# Patient Record
Sex: Male | Born: 1950 | Race: White | Hispanic: No | Marital: Married | Smoking: Current every day smoker
Health system: Southern US, Community
[De-identification: ages and names within clinical notes are randomized; demographics above are authoritative.]

## PROBLEM LIST (undated history)

## (undated) DIAGNOSIS — J432 Centrilobular emphysema: Secondary | ICD-10-CM

## (undated) DIAGNOSIS — K08109 Complete loss of teeth, unspecified cause, unspecified class: Secondary | ICD-10-CM

## (undated) DIAGNOSIS — M1990 Unspecified osteoarthritis, unspecified site: Secondary | ICD-10-CM

## (undated) DIAGNOSIS — Z87442 Personal history of urinary calculi: Secondary | ICD-10-CM

## (undated) DIAGNOSIS — E785 Hyperlipidemia, unspecified: Secondary | ICD-10-CM

## (undated) DIAGNOSIS — Z973 Presence of spectacles and contact lenses: Secondary | ICD-10-CM

## (undated) DIAGNOSIS — N400 Enlarged prostate without lower urinary tract symptoms: Secondary | ICD-10-CM

## (undated) DIAGNOSIS — C189 Malignant neoplasm of colon, unspecified: Secondary | ICD-10-CM

## (undated) DIAGNOSIS — I700 Atherosclerosis of aorta: Secondary | ICD-10-CM

## (undated) DIAGNOSIS — J410 Simple chronic bronchitis: Secondary | ICD-10-CM

## (undated) DIAGNOSIS — I708 Atherosclerosis of other arteries: Secondary | ICD-10-CM

## (undated) DIAGNOSIS — Z972 Presence of dental prosthetic device (complete) (partial): Secondary | ICD-10-CM

## (undated) DIAGNOSIS — R918 Other nonspecific abnormal finding of lung field: Secondary | ICD-10-CM

## (undated) DIAGNOSIS — I70219 Atherosclerosis of native arteries of extremities with intermittent claudication, unspecified extremity: Secondary | ICD-10-CM

## (undated) HISTORY — DX: Atherosclerosis of other arteries: I70.8

## (undated) HISTORY — DX: Malignant neoplasm of colon, unspecified: C18.9

## (undated) HISTORY — DX: Hyperlipidemia, unspecified: E78.5

## (undated) HISTORY — DX: Benign prostatic hyperplasia without lower urinary tract symptoms: N40.0

## (undated) HISTORY — DX: Atherosclerosis of aorta: I70.0

## (undated) HISTORY — PX: MULTIPLE TOOTH EXTRACTIONS: SHX2053

---

## 2011-06-09 ENCOUNTER — Ambulatory Visit: Payer: Self-pay | Admitting: Family Medicine

## 2011-06-09 VITALS — BP 128/69 | HR 67 | Temp 97.4°F | Resp 16 | Ht 65.0 in | Wt 128.0 lb

## 2011-06-09 DIAGNOSIS — L0291 Cutaneous abscess, unspecified: Secondary | ICD-10-CM

## 2011-06-09 DIAGNOSIS — L03221 Cellulitis of neck: Secondary | ICD-10-CM

## 2011-06-09 MED ORDER — DOXYCYCLINE HYCLATE 100 MG PO CAPS: 100.0000 mg | ORAL_CAPSULE | Freq: Two times a day (BID) | ORAL | Status: AC

## 2011-06-09 NOTE — Progress Notes (Signed)
  Patient Name: Mario Mcdowell Date of Birth: [birth year] Medical Record Number: [id number] Gender: male Date of Encounter: 06/09/2011  History of Present Illness:  Mario Mcdowell is a 61 y.o. very pleasant male patient who presents with the following:  He had an itchy rash on his right forearm- he scratched them and they ?got infected and "turned into a water blister."  This started 3 weeks ago. He admits to scratching it quite a bit.   He then developed a painful bump on the right side of his posterior neck.  It is not itchy- just painful.  He has tried to pop it and "scratched the top off of it" in his efforts but to no avail.  No fever, otherwise generally healthy except for tobacco use as far as he knows He works outside and is active  There is no problem list on file for this patient.  No past medical history on file. No past surgical history on file. History  Substance Use Topics  . Smoking status: Current Everyday Smoker  . Smokeless tobacco: Not on file  . Alcohol Use: Not on file   No family history on file. Allergies  Allergen Reactions  . Penicillins Swelling    Medication list has been reviewed and updated.  Review of Systems: As per HPI- otherwise negative.   Physical Examination: Filed Vitals:   06/09/11 0811  BP: 128/69  Pulse: 67  Temp: 97.4 F (36.3 C)  TempSrc: Oral  Resp: 16  Height: 5\' 5"  (1.651 m)  Weight: 128 lb (58.06 kg)    Body mass index is 21.30 kg/(m^2).  GEN: WDWN, NAD, Non-toxic, A & O x 3, wiry build, chronic sun/ tobacco exposure.  HEENT: Atraumatic, Normocephalic. Neck supple. No masses, No LAD.  Tm and oropharynx wnl Ears and Nose: No external deformity. CV: RRR, No M/G/R. No JVD. No thrill. No extra heart sounds. PULM: CTA B, no wheezes, crackles, rhonchi. No retractions. No resp. distress. No accessory muscle use. EXTR: No c/c/e NEURO Normal gait.  PSYCH: Normally interactive. Conversant. Not depressed or anxious appearing.   Calm demeanor.  Right arm: 2 healing lesions which appear to have been picked.  No vesicles or pus.  There is another picked area on the back of his neck- it is slightly swollen and tender, but not fluctuant.  It is slightly warm.  No drainage  Assessment and Plan: 1. Cellulitis, neck  doxycycline (VIBRAMYCIN) 100 MG capsule   Skin infections, likely related to picking skin. Gave #20 doxy pills from our stock bottle and instructed to take 1 BID.  Hot compresses to neck as much as possible to help with drainage. No picking!  Patient (or parent if minor) instructed to return to clinic or call if not better in 2-3 day(s).  Sooner if worse.

## 2011-08-11 ENCOUNTER — Ambulatory Visit: Payer: Self-pay | Admitting: Emergency Medicine

## 2011-08-11 VITALS — BP 147/72 | HR 69 | Temp 97.7°F | Resp 18 | Ht 64.5 in | Wt 127.2 lb

## 2011-08-11 DIAGNOSIS — IMO0002 Reserved for concepts with insufficient information to code with codable children: Secondary | ICD-10-CM

## 2011-08-11 DIAGNOSIS — L02419 Cutaneous abscess of limb, unspecified: Secondary | ICD-10-CM

## 2011-08-11 DIAGNOSIS — L03119 Cellulitis of unspecified part of limb: Secondary | ICD-10-CM

## 2011-08-11 MED ORDER — SULFAMETHOXAZOLE-TRIMETHOPRIM 800-160 MG PO TABS: 1.0000 | ORAL_TABLET | Freq: Two times a day (BID) | ORAL | Status: AC

## 2011-08-11 NOTE — Patient Instructions (Addendum)

## 2011-08-11 NOTE — Progress Notes (Signed)
  Subjective:    Patient ID: Mario Mcdowell, male    DOB: 1950/04/02, 62 y.o.   MRN: [id number]  HPI  Bitten by an insect a week ago and has pain in forearm.  Now swollen and red and draining an amber serous drainage.  No fever or chills.  No significant pain.  Is convinced he was bitten by a spider  Review of Systems    As per HPI, otherwise negative.    Objective:   Physical Exam  Cellulitis forearm surrounding a bite on his mid forearm extensor surface.  NATI.        Assessment & Plan:  Cellulitis forearm Septra Local heat F/u as needed

## 2012-01-05 ENCOUNTER — Ambulatory Visit: Payer: Self-pay | Admitting: Family Medicine

## 2012-01-05 VITALS — BP 138/75 | HR 71 | Temp 98.0°F | Resp 17 | Ht 64.0 in | Wt 124.0 lb

## 2012-01-05 DIAGNOSIS — H9209 Otalgia, unspecified ear: Secondary | ICD-10-CM

## 2012-01-05 DIAGNOSIS — H6120 Impacted cerumen, unspecified ear: Secondary | ICD-10-CM

## 2012-01-05 NOTE — Progress Notes (Signed)
  [facility]:  Office Visit  Chief Complaint:  Chief Complaint  Patient presents with  . Ear Fullness    ear pressure     HPI: Mario Mcdowell is a 61 y.o. male who complains of  1 week history of fullness in right ear. Tried everything otc without releif. Decrease hearing.   History reviewed. No pertinent past medical history. History reviewed. No pertinent past surgical history. History   Social History  . Marital Status: Married    Spouse Name: N/A    Number of Children: N/A  . Years of Education: N/A   Social History Main Topics  . Smoking status: Current Every Day Smoker -- 1.0 packs/day for 46 years    Types: Cigarettes  . Smokeless tobacco: Never Used  . Alcohol Use: No  . Drug Use: No  . Sexually Active: No   Other Topics Concern  . None   Social History Narrative  . None   History reviewed. No pertinent family history. Allergies  Allergen Reactions  . Penicillins Swelling   Prior to Admission medications   Medication Sig Start Date End Date Taking? Authorizing Provider  acetaminophen (TYLENOL) 325 MG tablet Take 650 mg by mouth every 6 (six) hours as needed.    Historical Provider, MD     ROS: The patient denies fevers, chills, night sweats, unintentional weight loss, chest pain, palpitations, wheezing, dyspnea on exertion, nausea, vomiting, abdominal pain, dysuria, hematuria, melena, numbness, weakness, or tingling.   All other systems have been reviewed and were otherwise negative with the exception of those mentioned in the HPI and as above.    PHYSICAL EXAM: Filed Vitals:   01/05/12 1622  BP: 138/75  Pulse: 71  Temp: 98 F (36.7 C)  Resp: 17   Filed Vitals:   01/05/12 1622  Height: 5\' 4"  (1.626 m)  Weight: 124 lb (56.246 kg)   Body mass index is 21.28 kg/(m^2).  General: Alert, no acute distress HEENT:  Normocephalic, atraumatic, oropharynx patent. Right TM blocked. Left TM nl Cardiovascular:  Regular rate and rhythm,  no rubs murmurs or gallops.  No Carotid bruits, radial pulse intact. No pedal edema.  Respiratory: Clear to auscultation bilaterally.  No wheezes, rales, or rhonchi.  No cyanosis, no use of accessory musculature GI: No organomegaly, abdomen is soft and non-tender, positive bowel sounds.  No masses. Skin: No rashes. Neurologic: Facial musculature symmetric. Psychiatric: Patient is appropriate throughout our interaction. Lymphatic: No cervical lymphadenopathy Musculoskeletal: Gait intact.   LABS: No results found for this or any previous visit.   EKG/XRAY:   Primary read interpreted by Dr. Conley Rolls at [facility].   ASSESSMENT/PLAN: Encounter Diagnosis  Name Primary?  . Cerumen impaction Yes   Cerumen disimpaction done without problems of left ear Patient has decrease hearing on right ear , whisper test ok but finger rubbing test was abnormal F/u prn     LE, THAO PHUONG, DO 01/05/2012 5:16 PM

## 2012-04-21 ENCOUNTER — Ambulatory Visit (INDEPENDENT_AMBULATORY_CARE_PROVIDER_SITE_OTHER): Payer: BC Managed Care – PPO | Admitting: Family Medicine

## 2012-04-21 VITALS — BP 108/72 | HR 72 | Temp 98.4°F | Resp 17 | Ht 64.0 in | Wt 122.0 lb

## 2012-04-21 DIAGNOSIS — L858 Other specified epidermal thickening: Secondary | ICD-10-CM

## 2012-04-21 DIAGNOSIS — D485 Neoplasm of uncertain behavior of skin: Secondary | ICD-10-CM

## 2012-04-21 DIAGNOSIS — D239 Other benign neoplasm of skin, unspecified: Secondary | ICD-10-CM

## 2012-04-21 NOTE — Progress Notes (Signed)
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 It has minimal discharge although patient has been manipulating it trying to express some inner contents. He is minimally tender, particularly at night when he rolls over on that side.  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  Objective: No acute distress. Lesion in question identified on the right anterolateral neck of the patient 2 and half to 3 cm donut-shaped lesion with central black crusty eschar and uniform surrounding elevated erythematous circumferential skin elevation. The lesion is well-defined. It is nontender and not draining.  After discussing the almost certain diagnosis of keratoacanthoma with patient, we have decided to proceed with removal of the lesion here in the office. Patient was in agreement.  Lesion was prepped with Betadine Lesion was then anesthetized with 1% Xylocaine and epinephrine with establishment of lesional anesthesia and circumferential numbness. Lesion was then excised using #15 Bard-Parker blade and surgical scissors to establish clean margins. The lesion was then placed in a specimen container and taken to the lab. The 3 cm wound was then closed using interrupted 5-0 mattress stitches x4  There no complications, no significant bleeding. He started wrestling was applied.  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  Plan: Patient to return for any significant discomfort, discharge, redness, fever. Otherwise he should return in one week for suture removal

## 2012-04-28 ENCOUNTER — Ambulatory Visit (INDEPENDENT_AMBULATORY_CARE_PROVIDER_SITE_OTHER): Payer: BC Managed Care – PPO | Admitting: Physician Assistant

## 2012-04-28 VITALS — BP 132/82 | HR 71 | Temp 98.2°F | Resp 17 | Ht 64.5 in | Wt 122.0 lb

## 2012-04-28 DIAGNOSIS — C801 Malignant (primary) neoplasm, unspecified: Secondary | ICD-10-CM

## 2012-04-28 DIAGNOSIS — IMO0002 Reserved for concepts with insufficient information to code with codable children: Secondary | ICD-10-CM

## 2012-04-28 NOTE — Progress Notes (Signed)
  Subjective:    Patient ID: Mario Mcdowell, male    DOB: [birth year], 62 y.o.   MRN: [id number]  HPI 62 year old male presents for suture removal.  Area is doing well - no erythema, warmth, or drainage. He admits he is concerned because pathology showed possible cancer.  He does have a history of skin cancer that he had removed on his lip several years ago.  Also complains of "multiple tiny scabs and crusted area on both arms" that seem to flake off and then recur.  Does admit to a great deal of sun exposure over the course of him lifetime due to working primarily outside.     Review of Systems  Constitutional: Negative for fever and chills.  Skin: Positive for wound. Negative for color change.  Neurological: Negative for headaches.       Objective:   Physical Exam  Constitutional: He is oriented to person, place, and time. He appears well-developed and well-nourished.  HENT:  Head: Normocephalic and atraumatic.  Right Ear: External ear normal.  Left Ear: External ear normal.  Neurological: He is alert and oriented to person, place, and time.  Skin:  Multiple crusted, flaky lesions on bilateral arms. Right neck well healing wound, slight erythema but no warmth, drainage, or tenderness.   Psychiatric: He has a normal mood and affect. His behavior is normal. Judgment and thought content normal.     #4 sutures removed without difficulty. Patient tolerated well     Assessment & Plan:  Squamous cell carcinoma - Plan: Ambulatory referral to Dermatology Sutures removed. Wound care provided.  Referral to Dermatology for further evaluation and treatment Biopsy reveals Squamous Cell Carcinoma on neck with margins involved. Will need further treatment.  Also recommend evaluation of actinic keratosis on both arms Recommend daily sunscreen use, since patient unable to avoid sun.  In addition, recommend he have a CPE in the next 1-2 months for full labwork and screening.

## 2012-05-01 ENCOUNTER — Telehealth: Payer: Self-pay | Admitting: *Deleted

## 2012-05-01 NOTE — Telephone Encounter (Signed)
Called patient per Doctor Lauenstein to give results of pathology. Patients's wife answered and took the message per hippa. Message was pathology came back as squamous cell carcinoma, but to reassure patient that Dr. said he wasn't worried and he still thought it was a keratoacanthoma. Doctor wants patient to come back in two weeks so he can take a look. I transferred his wife over to [facility] to make an appt. Pathology report sent to be scanned in.

## 2012-05-11 ENCOUNTER — Ambulatory Visit (INDEPENDENT_AMBULATORY_CARE_PROVIDER_SITE_OTHER): Payer: BC Managed Care – PPO | Admitting: Family Medicine

## 2012-05-11 VITALS — BP 118/56 | HR 66 | Temp 97.5°F | Resp 16 | Ht 64.5 in | Wt 121.0 lb

## 2012-05-11 DIAGNOSIS — C4442 Squamous cell carcinoma of skin of scalp and neck: Secondary | ICD-10-CM

## 2012-05-11 DIAGNOSIS — M542 Cervicalgia: Secondary | ICD-10-CM

## 2012-05-11 DIAGNOSIS — C760 Malignant neoplasm of head, face and neck: Secondary | ICD-10-CM

## 2012-05-11 NOTE — Progress Notes (Signed)
62 year old gentleman who spends a lot of time outdoors and came in several weeks ago with skin lesion on his right neck but to be a keratoacanthoma. The pathology showed some areas there were no distinguishable from squamous cell cancer and patient come back with a small little nodule on the suture line that he wants to have checked. He was referred to dermatologist.  Objective: Half centimeter nodule in wound line on right neck.  Area was prepped with Betadine, anesthetized with 1% Xylocaine and epinephrine.  Elliptical incision was made and the nodular area was removed 5-0 Ethilon was used to close the wound and a Band-Aid was applied  Assessment: Possible squamous cell cancer in the right neck  Plan: Send for dermatological pathology Return one week for suture removal   Signed, Elvina Sidle

## 2012-05-17 ENCOUNTER — Ambulatory Visit (INDEPENDENT_AMBULATORY_CARE_PROVIDER_SITE_OTHER): Payer: BC Managed Care – PPO | Admitting: Family Medicine

## 2012-05-17 ENCOUNTER — Encounter: Payer: Self-pay | Admitting: Physician Assistant

## 2012-05-17 VITALS — BP 114/64 | HR 65 | Temp 97.9°F | Resp 16 | Ht 64.5 in | Wt 122.4 lb

## 2012-05-17 DIAGNOSIS — C4492 Squamous cell carcinoma of skin, unspecified: Secondary | ICD-10-CM

## 2012-05-17 NOTE — Progress Notes (Signed)
Symptomatic: 62 year old man comes in for suture removal after skin lesion was excised from right neck.  Objective: No complaints of pain, swelling or itching 3 sutures removed without problem from well-healed right surgical site on the neck Past report showed atypical cells consistent with either squamous cell cancer or keratoacanthoma  Assessment: It appears that the entire lesion was removed.  Plan: I have asked patient to monitor for swelling or scaling in the area of his right neck.  He will return if any change occurs  Signed, Elvina Sidle and

## 2013-04-26 ENCOUNTER — Ambulatory Visit: Payer: No Typology Code available for payment source

## 2015-07-16 DIAGNOSIS — Z87442 Personal history of urinary calculi: Secondary | ICD-10-CM

## 2015-07-16 HISTORY — DX: Personal history of urinary calculi: Z87.442

## 2015-08-08 ENCOUNTER — Emergency Department: Payer: BLUE CROSS/BLUE SHIELD

## 2015-08-08 ENCOUNTER — Encounter: Payer: Self-pay | Admitting: Emergency Medicine

## 2015-08-08 ENCOUNTER — Emergency Department
Admission: EM | Admit: 2015-08-08 | Discharge: 2015-08-09 | Disposition: A | Discharge status: Home / Self Care | Payer: BLUE CROSS/BLUE SHIELD | Attending: Emergency Medicine | Admitting: Emergency Medicine

## 2015-08-08 DIAGNOSIS — N200 Calculus of kidney: Secondary | ICD-10-CM | POA: Insufficient documentation

## 2015-08-08 DIAGNOSIS — R1031 Right lower quadrant pain: Secondary | ICD-10-CM | POA: Diagnosis present

## 2015-08-08 DIAGNOSIS — F17210 Nicotine dependence, cigarettes, uncomplicated: Secondary | ICD-10-CM | POA: Diagnosis not present

## 2015-08-08 LAB — COMPREHENSIVE METABOLIC PANEL
ALBUMIN: 4.4 g/dL (ref 3.5–5.0)
ALK PHOS: 78 U/L (ref 38–126)
ALT: 16 U/L — AB (ref 17–63)
AST: 28 U/L (ref 15–41)
Anion gap: 8 (ref 5–15)
BILIRUBIN TOTAL: 0.8 mg/dL (ref 0.3–1.2)
BUN: 16 mg/dL (ref 6–20)
CALCIUM: 8.7 mg/dL — AB (ref 8.9–10.3)
CO2: 22 mmol/L (ref 22–32)
CREATININE: 0.98 mg/dL (ref 0.61–1.24)
Chloride: 109 mmol/L (ref 101–111)
GFR calc non Af Amer: >60 mL/min (ref >60)
GLUCOSE: 113 mg/dL — AB (ref 65–99)
Potassium: 4.5 mmol/L (ref 3.5–5.1)
SODIUM: 139 mmol/L (ref 135–145)
TOTAL PROTEIN: 7.7 g/dL (ref 6.5–8.1)

## 2015-08-08 LAB — URINALYSIS COMPLETE WITH MICROSCOPIC (ARMC ONLY)
Bacteria, UA: NONE SEEN
Bilirubin Urine: NEGATIVE
Glucose, UA: NEGATIVE mg/dL
Leukocytes, UA: NEGATIVE
Nitrite: NEGATIVE
PH: 6 (ref 5.0–8.0)
PROTEIN: NEGATIVE mg/dL
Specific Gravity, Urine: 1.018 (ref 1.005–1.030)

## 2015-08-08 LAB — CBC WITH DIFFERENTIAL/PLATELET
Basophils Absolute: 0.2 10*3/uL — ABNORMAL HIGH (ref 0–0.1)
Basophils Relative: 1 %
EOS ABS: 0 10*3/uL (ref 0–0.7)
Eosinophils Relative: 0 %
HEMATOCRIT: 45.5 % (ref 40.0–52.0)
HEMOGLOBIN: 15.9 g/dL (ref 13.0–18.0)
LYMPHS ABS: 3.5 10*3/uL (ref 1.0–3.6)
Lymphocytes Relative: 20 %
MCH: 32.6 pg (ref 26.0–34.0)
MCHC: 34.9 g/dL (ref 32.0–36.0)
MCV: 93.4 fL (ref 80.0–100.0)
Monocytes Absolute: 0.9 10*3/uL (ref 0.2–1.0)
NEUTROS ABS: 13 10*3/uL — AB (ref 1.4–6.5)
Platelets: 210 10*3/uL (ref 150–440)
RBC: 4.87 MIL/uL (ref 4.40–5.90)
RDW: 14.9 % — ABNORMAL HIGH (ref 11.5–14.5)
WBC: 17.6 10*3/uL — AB (ref 3.8–10.6)

## 2015-08-08 LAB — TROPONIN I: Troponin I: <0.03 ng/mL (ref <0.03)

## 2015-08-08 LAB — LIPASE, BLOOD: Lipase: 66 U/L — ABNORMAL HIGH (ref 11–51)

## 2015-08-08 MED ORDER — DIATRIZOATE MEGLUMINE & SODIUM 66-10 % PO SOLN
15.0000 mL | Freq: Once | ORAL | Status: AC
Start: 2015-08-08 — End: 2015-08-08
  Administered 2015-08-08: 15 mL via ORAL

## 2015-08-08 MED ORDER — MORPHINE SULFATE (PF) 4 MG/ML IV SOLN
4.0000 mg | Freq: Once | INTRAVENOUS | Status: AC
Start: 2015-08-08 — End: 2015-08-08
  Administered 2015-08-08: 4 mg via INTRAVENOUS

## 2015-08-08 MED ORDER — ONDANSETRON HCL 4 MG/2ML IJ SOLN
INTRAMUSCULAR | Status: AC
  Administered 2015-08-08: 4 mg via INTRAVENOUS
  Filled 2015-08-08: qty 2

## 2015-08-08 MED ORDER — IOPAMIDOL (ISOVUE-300) INJECTION 61%
100.0000 mL | Freq: Once | INTRAVENOUS | Status: AC | PRN
  Administered 2015-08-08: 100 mL via INTRAVENOUS

## 2015-08-08 MED ORDER — MORPHINE SULFATE (PF) 4 MG/ML IV SOLN
INTRAVENOUS | Status: AC
  Administered 2015-08-08: 4 mg via INTRAVENOUS
  Filled 2015-08-08: qty 1

## 2015-08-08 MED ORDER — SODIUM CHLORIDE 0.9 % IV BOLUS (SEPSIS)
1000.0000 mL | Freq: Once | INTRAVENOUS | Status: AC
  Administered 2015-08-08: 1000 mL via INTRAVENOUS

## 2015-08-08 MED ORDER — ONDANSETRON HCL 4 MG/2ML IJ SOLN
4.0000 mg | Freq: Once | INTRAMUSCULAR | Status: AC
Start: 2015-08-08 — End: 2015-08-08
  Administered 2015-08-08: 4 mg via INTRAVENOUS

## 2015-08-08 NOTE — ED Provider Notes (Signed)
[facility] Emergency Department Provider Note   ____________________________________________  Time seen: Approximately 7:15 PM I have reviewed the triage vital signs and the triage nursing note.  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 Slightly waxing and waning but somewhat worse now. Nothing makes was better. He has not noticed hematuria or dysuria, although right now he feels like he has to urinate. No vomiting. No chest pain. No diarrhea. No prior history of kidney stones. Pain is currently severe.    History reviewed. No pertinent past medical history.  There are no active problems to display for this patient.   History reviewed. No pertinent surgical history.    Allergies Penicillins  No family history on file.  Social History Social History  Substance Use Topics  . Smoking status: Current Every Day Smoker    Packs/day: 1.00    Years: 46.00    Types: Cigarettes  . Smokeless tobacco: Never Used  . Alcohol use No    Review of Systems  Constitutional: Negative for fever. Eyes: Negative for visual changes. ENT: Negative for sore throat. Cardiovascular: Negative for chest pain. Respiratory: Negative for shortness of breath. Gastrointestinal: Negative for vomiting Or diarrhea. Genitourinary: Negative for dysuria. Musculoskeletal: Negative for back pain. Skin: Negative for rash. Neurological: Negative for headache. 10 point Review of Systems otherwise negative ____________________________________________   PHYSICAL EXAM:  VITAL SIGNS: ED Triage Vitals  Enc Vitals Group     BP 08/08/15 1849 (!) 199/98     Pulse Rate 08/08/15 1849 82     Resp 08/08/15 1849 20     Temp 08/08/15 1849 97.8 F (36.6 C)     Temp Source 08/08/15 1849 Oral     SpO2 08/08/15 1849 100 %      Weight 08/08/15 1849 120 lb (54.4 kg)     Height 08/08/15 1849 5\' 8"  (1.727 m)     Head Circumference --      Peak Flow --      Pain Score 08/08/15 1850 8     Pain Loc --      Pain Edu? --      Excl. in [place]? --      Constitutional: Alert and oriented. Appears to be in pain writhing a little bit on the bed. HEENT   Head: Normocephalic and atraumatic.      Eyes: Conjunctivae are normal. PERRL. Normal extraocular movements.      Ears:         Nose: No congestion/rhinnorhea.   Mouth/Throat: Mucous membranes are moist.   Neck: No stridor. Cardiovascular/Chest: Normal rate, regular rhythm.  No murmurs, rubs, or gallops. Respiratory: Normal respiratory effort without tachypnea nor retractions. Breath sounds are clear and equal bilaterally. No wheezes/rales/rhonchi. Gastrointestinal: Soft. No distention, no guarding, no rebound. Mild discomfort in the right side of the abdomen without focal McBurney's point tenderness.  Genitourinary/rectal:Deferred Musculoskeletal: Nontender with normal range of motion in all extremities. No joint effusions.  No lower extremity tenderness.  No edema. Neurologic:  Normal speech and language. No gross or focal neurologic deficits are appreciated. Skin:  Skin is warm, dry and intact. No rash noted. Psychiatric: Mood and affect are normal. Speech and behavior are normal. Patient exhibits appropriate insight and judgment.  ____________________________________________   EKG I, Lisa Roca, MD, the attending physician have personally viewed  and interpreted all ECGs.  77 bpm. Normal sinus rhythm. Narrow QRS. Normal axis. Normal ST and T waves. Q waves septally. ____________________________________________  LABS (pertinent positives/negatives)  Labs Reviewed  CBC WITH DIFFERENTIAL/PLATELET  COMPREHENSIVE METABOLIC PANEL  LIPASE, BLOOD  TROPONIN I  URINALYSIS COMPLETEWITH MICROSCOPIC ([facility] ONLY)     ____________________________________________  RADIOLOGY All Xrays were viewed by me. Imaging interpreted by Radiologist.  Ultrasound renal:  IMPRESSION: Minimal right perinephric fluid of uncertain significance. No obstructive changes are noted bilaterally. Electronically Signed   By: Inez Catalina M.D.   CT abdomen and pelvis without contrast: Pending __________________________________________  PROCEDURES  Procedure(s) performed: None  Critical Care performed: None  ____________________________________________   ED COURSE / ASSESSMENT AND PLAN  Pertinent labs & imaging results that were available during my care of the patient were reviewed by me and considered in my medical decision making (see chart for details).   This patient clinically looks like he may have a kidney stone, this would be a first-time kidney stone and I am going to go ahead and image for diagnosis.    Patient was given IV pain and nausea medications as well as IV fluids.  Due to electrical problems, CT imaging was delayed and I decided to order ultrasound renal instead initially.  Ultrasound did not confirm diagnosis of kidney stone.  Patient does have hematuria and significant pain relief after pain medication, however his white blood cell count is significantly elevated at 17,000 he still has some discomfort in the right lower quadrant and so I discussed with him obtaining CT imaging of the abdomen with contrast to rule out appendicitis.  Patient care transferred to Dr. Dineen Kid at shift change 9 PM.     CONSULTATIONS:   None   Patient / Family / Caregiver informed of clinical course, medical decision-making process, and agree with plan.    ___________________________________________   FINAL CLINICAL IMPRESSION(S) / ED DIAGNOSES   Final diagnoses:  Right lower quadrant abdominal pain              Note: This dictation was prepared with Dragon dictation. Any  transcriptional errors that result from this process are unintentional    Lisa Roca, MD 08/08/15 2049

## 2015-08-08 NOTE — ED Triage Notes (Signed)
Reports right side abdominal pain and nausea onset 4pm.

## 2015-08-08 NOTE — ED Notes (Signed)
Patient transported to CT 

## 2015-08-08 NOTE — ED Notes (Signed)
Patient transported to Ultrasound 

## 2015-08-09 MED ORDER — OXYCODONE-ACETAMINOPHEN 5-325 MG PO TABS: 1.0000 | ORAL_TABLET | Freq: Four times a day (QID) | ORAL | 0 refills | Status: DC | PRN

## 2015-08-09 MED ORDER — ONDANSETRON HCL 4 MG PO TABS: 4.0000 mg | ORAL_TABLET | Freq: Every day | ORAL | 1 refills | Status: DC | PRN

## 2015-08-09 NOTE — ED Provider Notes (Signed)
-----------------------------------------   2:48 AM on 08/09/2015 -----------------------------------------   Blood pressure 120/70, pulse 63, temperature 97.8 F (36.6 C), temperature source Oral, resp. rate 16, height 5\' 8"  (1.727 m), weight 120 lb (54.4 kg), SpO2 94 %.  Assuming care from Dr. Clearnce Hasten.  In short, Mario Mcdowell is a 65 y.o. male with a chief complaint of Abdominal Pain .  Refer to the original H&P for additional details.  The current plan of care is to follow up the results of the CT scan.  CT scan: A 3 mm right UVJ stone versus a recently passed right renal calculus with mild right hydronephrosis. Correlation with urinalysis recommended to exclude superimposed UTI. No evidence of bowel obstruction or active inflammation. Normal appendix.  It appears as though the patient has a right-sided UVJ kidney stone or has passed a kidney stone. The patient does have some moderate hydronephrosis. His pain is controlled at this time. I'll give the patient a prescription for some Percocet as well as some Zofran and have him follow back up with urology. The patient has no further complaints or concerns at this time.    Loney Hering, MD 08/09/15 [phone]

## 2015-08-21 NOTE — Progress Notes (Addendum)
08/22/2015 8:58 AM   [name] 1950/11/27 [id number]  Referring provider: Neena Rhymes, MD [address] [place], [place] [address]  Chief Complaint  Patient presents with  . Nephrolithiasis    New Patient    HPI: Patient is a 65 year old Caucasian male who is referred by [facility] ED for nephrolithiasis.  Patient states the onset of the pain was sudden after picking up something in the yard.  The pain continued for several hours and increased in intensity.  It was sharp.  It lasted for several hours.  The pain was located in the right waist without radiation.   The pain was a 10/10.  Nothing made the pain better.   Nothing made the pain worse.  He did have nausea and vomiting.  In the ED, patient received morphine and odansteron IV.  UA in the ED noted TNTC RBC's/hpf.  .  Serum creatinine was 0.98.   Patient's RUS in the ED noted minimal right perinephric fluid of uncertain significance. No obstructive changes are noted bilaterally.   CT abdomen/pelvis with contrast noted a 3 mm right UVJ stone versus a recently passed right renal calculus with mild right hydronephrosis.  No evidence of bowel obstruction or active inflammation. Normal appendix.  I have personally reviewed the films.    Today, he is not having pain.  He has not passed a fragment.  He did not endorse gross hematuria. UA is unremarkable.  He is not having fevers, chills, nausea or vomiting.    He does not have a prior history of stones.    He denies any difficulty with urination.  He has not had sex in 71 years.  He has not had a recent PSA or exam.       PMH: Past Medical History:  Diagnosis Date  . Skin cancer     Surgical History: Past Surgical History:  Procedure Laterality Date  . SKIN CANCER EXCISION     lip    Home Medications:    Medication List       Accurate as of 08/22/15  8:58 AM. Always use your most recent med list.          ibuprofen 400 MG tablet Commonly known as:   ADVIL,MOTRIN Take 400 mg by mouth every 6 (six) hours as needed.   Melatonin 5 MG Tabs Take by mouth.   Potassium Gluconate 595 MG Caps Take by mouth.       Allergies:  Allergies  Allergen Reactions  . Penicillins Swelling    Has patient had a PCN reaction causing immediate rash, facial/tongue/throat swelling, SOB or lightheadedness with hypotension: Yes Has patient had a PCN reaction causing severe rash involving mucus membranes or skin necrosis: No Has patient had a PCN reaction that required hospitalization No Has patient had a PCN reaction occurring within the last 10 years: No If all of the above answers are "NO", then may proceed with Cephalosporin use.     Family History: Family History  Problem Relation Age of Onset  . Prostate cancer Neg Hx   . Kidney cancer Neg Hx     Social History:  reports that he has been smoking Cigarettes.  He has a 46.00 pack-year smoking history. He has never used smokeless tobacco. He reports that he does not drink alcohol or use drugs.  ROS: UROLOGY Frequent Urination?: No Hard to postpone urination?: No Burning/pain with urination?: No Get up at night to urinate?: No Leakage of urine?: No  Urine stream starts and stops?: No Trouble starting stream?: No Do you have to strain to urinate?: No Blood in urine?: No Urinary tract infection?: No Sexually transmitted disease?: No Injury to kidneys or bladder?: No Painful intercourse?: No Weak stream?: No Erection problems?: No Penile pain?: No  Gastrointestinal Nausea?: Yes Vomiting?: Yes Indigestion/heartburn?: No Diarrhea?: No Constipation?: No  Constitutional Fever: No Night sweats?: No Weight loss?: No Fatigue?: No  Skin Skin rash/lesions?: No Itching?: No  Eyes Blurred vision?: No Double vision?: No  Ears/Nose/Throat Sore throat?: No Sinus problems?: No  Hematologic/Lymphatic Swollen glands?: No Easy bruising?: Yes  Cardiovascular Leg swelling?:  No Chest pain?: No  Respiratory Cough?: No Shortness of breath?: No  Endocrine Excessive thirst?: No  Musculoskeletal Back pain?: Yes Joint pain?: Yes  Neurological Headaches?: No Dizziness?: No  Psychologic Depression?: No Anxiety?: No  Physical Exam: BP 115/70   Pulse 68   Ht 5\' 5"  (1.651 m)   Wt 117 lb (53.1 kg)   BMI 19.47 kg/m   Constitutional: Well nourished. Alert and oriented, No acute distress. HEENT: [place] AT, moist mucus membranes. Trachea midline, no masses. Cardiovascular: No clubbing, cyanosis, or edema. Respiratory: Normal respiratory effort, no increased work of breathing. GI: Abdomen is soft, non tender, non distended, no abdominal masses. Liver and spleen not palpable.  No hernias appreciated.  Stool sample for occult testing is not indicated.   GU: No CVA tenderness.  No bladder fullness or masses.  Patient with circumcised phallus. Urethral meatus is patent, stenosed.  No penile discharge. No penile lesions or rashes. Scrotum without lesions, rashes and/or edema, but with a sebaceous cyst  Testicles are located scrotally bilaterally. No masses are appreciated in the testicles. Left and right epididymis are normal. Rectal: Patient with  normal sphincter tone. Anus and perineum without scarring or rashes. No rectal masses are appreciated. Prostate is approximately 55 grams, firm in the bilateral lobes in the apex, no nodules are appreciated. Seminal vesicles are normal. Skin: No rashes, bruises or suspicious lesions. Lymph: No cervical or inguinal adenopathy. Neurologic: Grossly intact, no focal deficits, moving all 4 extremities. Psychiatric: Normal mood and affect.  Laboratory Data: Lab Results  Component Value Date   WBC 17.6 (H) 08/08/2015   HGB 15.9 08/08/2015   HCT 45.5 08/08/2015   MCV 93.4 08/08/2015   PLT 210 08/08/2015    Lab Results  Component Value Date   CREATININE 0.98 08/08/2015    Lab Results  Component Value Date   AST 28  08/08/2015   Lab Results  Component Value Date   ALT 16 (L) 08/08/2015    Urinalysis   Pertinent Imaging: CLINICAL DATA:  Right-sided flank pain EXAM: RENAL / URINARY TRACT ULTRASOUND COMPLETE COMPARISON:  None. FINDINGS: Right Kidney: Length: 10.3 cm. Minimal perinephric fluid is noted. No obstructive changes are seen. Left Kidney: Length: 10.3 cm. Echogenicity within normal limits. No mass or hydronephrosis visualized. Bladder: Appears normal for degree of bladder distention. IMPRESSION: Minimal right perinephric fluid of uncertain significance. No obstructive changes are noted bilaterally. Electronically Signed   By: Inez Catalina M.D.   On: 08/08/2015 20:32  CLINICAL DATA:  65 year old male with right lower quadrant abdominal pain and vomiting EXAM: CT ABDOMEN AND PELVIS WITH CONTRAST TECHNIQUE: Multidetector CT imaging of the abdomen and pelvis was performed using the standard protocol following bolus administration of intravenous contrast. CONTRAST:  150mL ISOVUE-300 IOPAMIDOL (ISOVUE-300) INJECTION 61% COMPARISON:  Renal ultrasound dated 08/08/2015 FINDINGS: Minimal left lung base atelectatic changes. A 3  mm subpleural calcified granuloma noted in the right lower lobe. The visualized lung bases are otherwise clear. No intra-abdominal free air or free fluid. Subcentimeter right hepatic hypodense lesions are too small to characterize but may represent cysts or hemangioma. The liver is otherwise unremarkable. No intrahepatic biliary ductal dilatation. The gallbladder, pancreas, spleen, and adrenal glands appear unremarkable. There is a 3 mm calculus along the posterior wall of the urinary bladder adjacent to the right ureterovesical junction which may represent a right UVJ stone versus a recently passed right renal calculus. There is mild right hydronephrosis. Mild right perinephric stranding. Correlation with urinalysis recommended to exclude superimposed  UTI. The left kidney, and left ureter appear unremarkable. The prostate and seminal vesicles are grossly unremarkable. There is no evidence of bowel obstruction or active inflammation. Normal appendix. There is advanced aortoiliac atherosclerotic disease. There is severe bilateral common iliac artery stenosis with near complete obliteration of the left common iliac artery. There are atherosclerotic calcifications of the origins of the celiac axis, and SMA. This vessels however remain patent. The SMV, splenic vein, and main portal veins are also patent. No portal venous gas identified. There is no adenopathy. The abdominal wall soft tissues appear unremarkable. There is degenerative changes of the spine. No acute fracture. IMPRESSION: A 3 mm right UVJ stone versus a recently passed right renal calculus with mild right hydronephrosis. Correlation with urinalysis recommended to exclude superimposed UTI. No evidence of bowel obstruction or active inflammation. Normal appendix. Electronically Signed   By: Anner Crete M.D.   On: 08/09/2015 02:17  Assessment & Plan:    1. Right ureteral stone  - did not capture a fragment  - symptom free, most likely passed  2. Right hydronephrosis  - obtain RUS to ensure the hydronephrosis has resolved.    3. Microscopic hematuria  - UA today is unremarkable  - continue to monitor the patient's UA after the treatment/passage of the stone to ensure the hematuria has resolved  - if hematuria persists, we will pursue a hematuria workup with CT Urogram and cystoscopy if appropriate.  4. BPH  - AUA panel feels that in men age 65 to 60 years, there was sufficient certainty that the benefits of screening could outweigh the harms that a recommendation of shared decision-making in this age group was justified.  - dicussed with patient and he agrees with exam and PSA  - PSA  5. Meatal stenosis  - not having issues with urination  - continue to  monitor with yearly IPSS   Return for RUS report .  These notes generated with voice recognition software. I apologize for typographical errors.  Zara Council, [facility] [address] [place], [place] [address] [phone]

## 2015-08-22 ENCOUNTER — Encounter: Payer: Self-pay | Admitting: Urology

## 2015-08-22 ENCOUNTER — Ambulatory Visit (INDEPENDENT_AMBULATORY_CARE_PROVIDER_SITE_OTHER): Payer: BLUE CROSS/BLUE SHIELD | Admitting: Urology

## 2015-08-22 VITALS — BP 115/70 | HR 68 | Ht 65.0 in | Wt 117.0 lb

## 2015-08-22 DIAGNOSIS — IMO0002 Reserved for concepts with insufficient information to code with codable children: Secondary | ICD-10-CM

## 2015-08-22 DIAGNOSIS — N201 Calculus of ureter: Secondary | ICD-10-CM

## 2015-08-22 DIAGNOSIS — N400 Enlarged prostate without lower urinary tract symptoms: Secondary | ICD-10-CM

## 2015-08-22 DIAGNOSIS — N359 Urethral stricture, unspecified: Secondary | ICD-10-CM | POA: Diagnosis not present

## 2015-08-22 DIAGNOSIS — R3129 Other microscopic hematuria: Secondary | ICD-10-CM

## 2015-08-22 DIAGNOSIS — N132 Hydronephrosis with renal and ureteral calculous obstruction: Secondary | ICD-10-CM | POA: Diagnosis not present

## 2015-08-22 LAB — MICROSCOPIC EXAMINATION
Bacteria, UA: NONE SEEN
Epithelial Cells (non renal): NONE SEEN /hpf (ref 0–10)
RBC, UA: NONE SEEN /hpf (ref 0–?)
WBC, UA: NONE SEEN /hpf (ref 0–?)

## 2015-08-22 LAB — URINALYSIS, COMPLETE
Bilirubin, UA: NEGATIVE
GLUCOSE, UA: NEGATIVE
KETONES UA: NEGATIVE
LEUKOCYTES UA: NEGATIVE
Nitrite, UA: NEGATIVE
PROTEIN UA: NEGATIVE
RBC UA: NEGATIVE
Specific Gravity, UA: 1.015 (ref 1.005–1.030)
Urobilinogen, Ur: 0.2 mg/dL (ref 0.2–1.0)
pH, UA: 5.5 (ref 5.0–7.5)

## 2015-08-23 ENCOUNTER — Telehealth: Payer: Self-pay

## 2015-08-23 LAB — PSA: PROSTATE SPECIFIC AG, SERUM: 0.8 ng/mL (ref 0.0–4.0)

## 2015-08-23 NOTE — Telephone Encounter (Signed)
Spoke with pt in reference to PSA and RUS. Pt voiced understanding.

## 2015-08-23 NOTE — Telephone Encounter (Signed)
LMOM

## 2015-08-23 NOTE — Telephone Encounter (Signed)
-----   Message from Nori Riis, PA-C sent at 08/23/2015  8:37 AM EDT ----- Please notify the patient that his PSA is normal. RUS is pending.

## 2015-08-26 LAB — CULTURE, URINE COMPREHENSIVE

## 2015-08-29 ENCOUNTER — Ambulatory Visit
Admission: RE | Admit: 2015-08-29 | Discharge: 2015-08-29 | Disposition: A | Discharge status: Home / Self Care | Payer: Medicare Other | Source: Ambulatory Visit | Attending: Urology | Admitting: Urology

## 2015-08-29 DIAGNOSIS — Z87442 Personal history of urinary calculi: Secondary | ICD-10-CM | POA: Diagnosis not present

## 2015-08-29 DIAGNOSIS — N1330 Unspecified hydronephrosis: Secondary | ICD-10-CM | POA: Diagnosis not present

## 2015-08-29 DIAGNOSIS — N132 Hydronephrosis with renal and ureteral calculous obstruction: Secondary | ICD-10-CM

## 2015-09-05 ENCOUNTER — Encounter: Payer: Self-pay | Admitting: Urology

## 2015-09-05 ENCOUNTER — Ambulatory Visit (INDEPENDENT_AMBULATORY_CARE_PROVIDER_SITE_OTHER): Payer: Medicare Other | Admitting: Urology

## 2015-09-05 VITALS — BP 109/67 | HR 68 | Ht 65.0 in | Wt 119.5 lb

## 2015-09-05 DIAGNOSIS — N359 Urethral stricture, unspecified: Secondary | ICD-10-CM

## 2015-09-05 DIAGNOSIS — I771 Stricture of artery: Secondary | ICD-10-CM

## 2015-09-05 DIAGNOSIS — N132 Hydronephrosis with renal and ureteral calculous obstruction: Secondary | ICD-10-CM | POA: Diagnosis not present

## 2015-09-05 DIAGNOSIS — N400 Enlarged prostate without lower urinary tract symptoms: Secondary | ICD-10-CM | POA: Diagnosis not present

## 2015-09-05 DIAGNOSIS — R3129 Other microscopic hematuria: Secondary | ICD-10-CM | POA: Diagnosis not present

## 2015-09-05 DIAGNOSIS — IMO0002 Reserved for concepts with insufficient information to code with codable children: Secondary | ICD-10-CM

## 2015-09-05 NOTE — Progress Notes (Signed)
09/05/2015 2:07 PM   Mario Mcdowell 11-13-1950 [id number]  Referring provider: Neena Rhymes, MD [address] [place], [place] [address]  Chief Complaint  Patient presents with  . Results    RUS    HPI: Patient is a 65 year old Caucasian male who presents today to discuss her RUS results.    Background history Patient was referred by [facility] ED for nephrolithiasis.  Patient states the onset of the pain was sudden after picking up something in the yard.  The pain continued for several hours and increased in intensity.  It was sharp.  It lasted for several hours.  The pain was located in the right waist without radiation.   The pain was a 10/10.  Nothing made the pain better.   Nothing made the pain worse.  He did have nausea and vomiting.  In the ED, patient received morphine and ondansetron IV.  UA in the ED noted TNTC RBC's/hpf.  .  Serum creatinine was 0.98.  Patient's RUS in the ED noted minimal right perinephric fluid of uncertain significance. No obstructive changes are noted bilaterally.   CT abdomen/pelvis with contrast noted a 3 mm right UVJ stone versus a recently passed right renal calculus with mild right hydronephrosis.  No evidence of bowel obstruction or active inflammation. Normal appendix.  I have personally reviewed the films.  Today, he is not having pain.  He has not passed a fragment.  He did not endorse gross hematuria. UA is unremarkable.  He is not having fevers, chills, nausea or vomiting.    He does not have a prior history of stones.    He denies any difficulty with urination.  He has not had sex in 5 years.    RUS performed on 08/29/2015 noted no hydronephrosis.  I have personally reviewed the films.    CT scan noted bilateral artery stenosis.      Today, he is not having any flank pain, gross hematuria, suprapubic pain or dysuria.  He is not having fevers, chills, nausea or vomiting.       PMH: Past Medical History:  Diagnosis Date  . Skin cancer      Surgical History: Past Surgical History:  Procedure Laterality Date  . SKIN CANCER EXCISION     lip    Home Medications:    Medication List       Accurate as of 09/05/15  2:07 PM. Always use your most recent med list.          ibuprofen 400 MG tablet Commonly known as:  ADVIL,MOTRIN Take 400 mg by mouth every 6 (six) hours as needed.   Melatonin 5 MG Tabs Take by mouth.   Potassium Gluconate 595 MG Caps Take by mouth.       Allergies:  Allergies  Allergen Reactions  . Penicillins Swelling    Has patient had a PCN reaction causing immediate rash, facial/tongue/throat swelling, SOB or lightheadedness with hypotension: Yes Has patient had a PCN reaction causing severe rash involving mucus membranes or skin necrosis: No Has patient had a PCN reaction that required hospitalization No Has patient had a PCN reaction occurring within the last 10 years: No If all of the above answers are "NO", then may proceed with Cephalosporin use.     Family History: Family History  Problem Relation Age of Onset  . Prostate cancer Neg Hx   . Kidney cancer Neg Hx     Social History:  reports that he has  been smoking Cigarettes.  He has a 46.00 pack-year smoking history. He has never used smokeless tobacco. He reports that he does not drink alcohol or use drugs.  ROS: UROLOGY Frequent Urination?: No Hard to postpone urination?: No Burning/pain with urination?: No Get up at night to urinate?: No Leakage of urine?: No Urine stream starts and stops?: No Trouble starting stream?: No Do you have to strain to urinate?: No Blood in urine?: No Urinary tract infection?: No Sexually transmitted disease?: No Injury to kidneys or bladder?: No Painful intercourse?: No Weak stream?: No Erection problems?: No Penile pain?: No  Gastrointestinal Nausea?: No Vomiting?: No Indigestion/heartburn?: No Diarrhea?: No Constipation?: No  Constitutional Fever: No Night sweats?:  No Weight loss?: No Fatigue?: No  Skin Skin rash/lesions?: No Itching?: No  Eyes Blurred vision?: No Double vision?: No  Ears/Nose/Throat Sore throat?: No Sinus problems?: No  Hematologic/Lymphatic Swollen glands?: No Easy bruising?: No  Cardiovascular Leg swelling?: No Chest pain?: No  Respiratory Cough?: No Shortness of breath?: No  Endocrine Excessive thirst?: No  Musculoskeletal Back pain?: No Joint pain?: No  Neurological Headaches?: No Dizziness?: No  Psychologic Depression?: No Anxiety?: No  Physical Exam: BP 109/67   Pulse 68   Ht 5\' 5"  (1.651 m)   Wt 119 lb 8 oz (54.2 kg)   BMI 19.89 kg/m   Constitutional: Well nourished. Alert and oriented, No acute distress. HEENT: [place] AT, moist mucus membranes. Trachea midline, no masses. Cardiovascular: No clubbing, cyanosis, or edema. Respiratory: Normal respiratory effort, no increased work of breathing. Skin: No rashes, bruises or suspicious lesions. Lymph: No cervical or inguinal adenopathy. Neurologic: Grossly intact, no focal deficits, moving all 4 extremities. Psychiatric: Normal mood and affect.  Laboratory Data: Lab Results  Component Value Date   WBC 17.6 (H) 08/08/2015   HGB 15.9 08/08/2015   HCT 45.5 08/08/2015   MCV 93.4 08/08/2015   PLT 210 08/08/2015    Lab Results  Component Value Date   CREATININE 0.98 08/08/2015    Lab Results  Component Value Date   AST 28 08/08/2015   Lab Results  Component Value Date   ALT 16 (L) 08/08/2015    Pertinent Imaging: CLINICAL DATA:  History of Hydronephrosis.  No current symptoms  EXAM: RENAL / URINARY TRACT ULTRASOUND COMPLETE  COMPARISON:  CT abdomen 08/08/2015 and ultrasound of the same day  FINDINGS: Right Kidney:  Length: 9.4 cm. Echogenicity within normal limits. No mass or hydronephrosis visualized.  Left Kidney:  Length: 10.5 cm. Echogenicity within normal limits. No mass or hydronephrosis  visualized.  Bladder:  Appears normal for degree of bladder distention.  IMPRESSION: Negative exam.   Electronically Signed   By: Staci Righter M.D.   On: 08/29/2015 14:32  Assessment & Plan:    1. Right ureteral stone  - did not capture a fragment  - symptom free, most likely passed  - RUS did not demonstrate a hydronephrosis  2. Right hydronephrosis  -  the hydronephrosis has resolved.    3. Microscopic hematuria  - resolved  4. BPH  - AUA panel feels that in men age 42 to 72 years, there was sufficient certainty that the benefits of screening could outweigh the harms that a recommendation of shared decision-making in this age group was justified.  - dicussed with patient and he agrees with exam and PSA  - PSA was 0.8 ng/mL on 08/22/2015  - RTC in 12 months for IPSS, PSA and exam  5. Meatal stenosis  -  not having issues with urination  - continue to monitor with yearly IPSS   6. Iliac artery stenosis  - following up with PCP  Return in about 1 year (around 09/04/2016) for IPSS, PSA and exam.  These notes generated with voice recognition software. I apologize for typographical errors.  Zara Council, [facility] [address] [place], [place] [address] [phone]

## 2015-10-25 ENCOUNTER — Encounter: Payer: Self-pay | Admitting: Family Medicine

## 2015-10-25 ENCOUNTER — Ambulatory Visit (INDEPENDENT_AMBULATORY_CARE_PROVIDER_SITE_OTHER): Payer: Medicare Other | Admitting: Family Medicine

## 2015-10-25 VITALS — BP 108/68 | HR 84 | Temp 98.2°F | Ht 63.5 in | Wt 119.5 lb

## 2015-10-25 DIAGNOSIS — Z720 Tobacco use: Secondary | ICD-10-CM | POA: Insufficient documentation

## 2015-10-25 DIAGNOSIS — F17200 Nicotine dependence, unspecified, uncomplicated: Secondary | ICD-10-CM

## 2015-10-25 DIAGNOSIS — I70299 Other atherosclerosis of native arteries of extremities, unspecified extremity: Secondary | ICD-10-CM

## 2015-10-25 DIAGNOSIS — N400 Enlarged prostate without lower urinary tract symptoms: Secondary | ICD-10-CM | POA: Diagnosis not present

## 2015-10-25 DIAGNOSIS — R202 Paresthesia of skin: Secondary | ICD-10-CM

## 2015-10-25 DIAGNOSIS — I700 Atherosclerosis of aorta: Secondary | ICD-10-CM

## 2015-10-25 DIAGNOSIS — I708 Atherosclerosis of other arteries: Principal | ICD-10-CM

## 2015-10-25 HISTORY — DX: Benign prostatic hyperplasia without lower urinary tract symptoms: N40.0

## 2015-10-25 MED ORDER — ASPIRIN EC 81 MG PO TBEC: 81.0000 mg | DELAYED_RELEASE_TABLET | Freq: Every day | ORAL | Status: DC

## 2015-10-25 NOTE — Progress Notes (Signed)
Pre visit review using our clinic review tool, if applicable. No additional management support is needed unless otherwise documented below in the visit note. 

## 2015-10-25 NOTE — Assessment & Plan Note (Signed)
Recent dx by CT scan in longstanding smoker. Recommended ABIs - pt declines at this time due to cost concerns  rec start aspirin 81mg  daily. If not tolerated, decrease to MWF dosing.

## 2015-10-25 NOTE — Assessment & Plan Note (Signed)
Established with urology.

## 2015-10-25 NOTE — Assessment & Plan Note (Addendum)
Anticipate periph neuropathy related - check for reversible causes when patient returns for fasting labs.

## 2015-10-25 NOTE — Progress Notes (Signed)
BP 108/68   Pulse 84   Temp 98.2 F (36.8 C) (Oral)   Ht 5' 3.5" (1.613 m)   Wt 119 lb 8 oz (54.2 kg)   BMI 20.84 kg/m    CC: new pt to establish care Subjective:    Patient ID: Mario Mcdowell, male    DOB: 08/16/1950, 65 y.o.   MRN: [id number]  HPI: Gonzales Maslowski is a 65 y.o. male presenting on 10/25/2015 for Establish Care   Prior saw Dr Joseph Art at [place] latest 2014. Not fasting today. Received medicare 08/2015.   H/o R kidney stone 08/2015. Told he had "hardening of arteries in legs". Reviewed CT abd/pelvis w/ contrast from 07/2015 - severe B CIA stenosis with near complete obilteration of L CIA.  Also endorses some paresthesias of R foot worse with driving.  Smoker - 1 ppd x 50+ yrs. Pre-contemplative.  BPH with normal PSA - followed by urology.   H/o squamous cell cancer of lip. Endorses bilateral forearm dry skin lesions. Enjoys fishing, doesn't wear sunscreen.  Nocturnal leg cramps treated with OTC potassium.   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 Interim medical history since our last visit reviewed. Allergies and medications reviewed and updated. Current Outpatient Prescriptions on File Prior to Visit  Medication Sig  . ibuprofen (ADVIL,MOTRIN) 400 MG tablet Take 400 mg by mouth every 6 (six) hours as needed.  . Potassium Gluconate 595 MG CAPS Take 1 capsule by mouth every other day.    No current facility-administered medications on file prior to visit.     Review of Systems Per HPI unless specifically indicated in ROS section     Objective:    BP 108/68   Pulse 84   Temp 98.2 F (36.8 C) (Oral)   Ht 5' 3.5" (1.613 m)   Wt 119 lb 8 oz (54.2 kg)   BMI 20.84 kg/m   Wt Readings from Last 3 Encounters:  10/25/15 119 lb 8 oz (54.2 kg)  09/05/15 119 lb 8 oz (54.2 kg)  08/22/15 117 lb (53.1 kg)    Physical Exam    Constitutional: He appears well-developed and well-nourished. No distress.  HENT:  Head: Normocephalic and atraumatic.  Mouth/Throat: Oropharynx is clear and moist. No oropharyngeal exudate.  Eyes: Conjunctivae and EOM are normal. Pupils are equal, round, and reactive to light.  Cardiovascular: Normal rate, regular rhythm, normal heart sounds and intact distal pulses.   No murmur heard. Pulmonary/Chest: Effort normal and breath sounds normal. No respiratory distress. He has no wheezes. He has no rales.  Musculoskeletal: He exhibits no edema.  Diminished DP bilaterally  Skin: Skin is warm and dry. No rash noted.  AK bilateral forearms  Psychiatric: He has a normal mood and affect.  Nursing note and vitals reviewed.     Assessment & Plan:   Problem List Items Addressed This Visit    Aorto-iliac atherosclerosis ([place]) - Primary    Recent dx by CT scan in longstanding smoker. Recommended ABIs - pt declines at this time due to cost concerns  rec start aspirin 81mg  daily. If not tolerated, decrease to MWF dosing.       Relevant Medications   aspirin EC 81 MG tablet   BPH without obstruction/lower urinary tract symptoms    Established with urology.      Paresthesia of right foot    Anticipate periph neuropathy related -  check for reversible causes when patient returns for fasting labs.      Smoker    Encouraged smoking cessation. Discussed benefits of quitting smoking as well as importance of smoking cessation to control PAD. Pt initially precontemplative, but upon end of visit states he will work towards cutting back by next visit.        Other Visit Diagnoses   None.      Follow up plan: Return in about 2 months (around 12/25/2015), or as needed, for annual exam, prior fasting for blood work.  Ria Bush, MD

## 2015-10-25 NOTE — Patient Instructions (Addendum)
Start aspirin 81mg  daily if tolerated.  Come in at your convenience for fasting labs. Work on cutting down on smoking over the next 1-2 months.  Return for welcome to medicare visit in 1-2 months.

## 2015-10-25 NOTE — Assessment & Plan Note (Signed)
Encouraged smoking cessation. Discussed benefits of quitting smoking as well as importance of smoking cessation to control PAD. Pt initially precontemplative, but upon end of visit states he will work towards cutting back by next visit.

## 2015-12-26 ENCOUNTER — Other Ambulatory Visit: Payer: Self-pay | Admitting: Family Medicine

## 2015-12-26 DIAGNOSIS — R202 Paresthesia of skin: Secondary | ICD-10-CM

## 2015-12-26 DIAGNOSIS — Z1159 Encounter for screening for other viral diseases: Secondary | ICD-10-CM

## 2015-12-26 DIAGNOSIS — I708 Atherosclerosis of other arteries: Principal | ICD-10-CM

## 2015-12-26 DIAGNOSIS — I700 Atherosclerosis of aorta: Secondary | ICD-10-CM

## 2015-12-30 ENCOUNTER — Other Ambulatory Visit (INDEPENDENT_AMBULATORY_CARE_PROVIDER_SITE_OTHER): Payer: Medicare Other

## 2015-12-30 DIAGNOSIS — I70299 Other atherosclerosis of native arteries of extremities, unspecified extremity: Secondary | ICD-10-CM | POA: Diagnosis not present

## 2015-12-30 DIAGNOSIS — I700 Atherosclerosis of aorta: Secondary | ICD-10-CM | POA: Diagnosis not present

## 2015-12-30 DIAGNOSIS — I708 Atherosclerosis of other arteries: Principal | ICD-10-CM

## 2015-12-30 DIAGNOSIS — Z1159 Encounter for screening for other viral diseases: Secondary | ICD-10-CM

## 2015-12-30 DIAGNOSIS — R202 Paresthesia of skin: Secondary | ICD-10-CM | POA: Diagnosis not present

## 2015-12-30 LAB — CBC WITH DIFFERENTIAL/PLATELET
BASOS PCT: 0.4 % (ref 0.0–3.0)
Basophils Absolute: 0 10*3/uL (ref 0.0–0.1)
EOS ABS: 0.1 10*3/uL (ref 0.0–0.7)
Eosinophils Relative: 1.8 % (ref 0.0–5.0)
HCT: 45 % (ref 39.0–52.0)
HEMOGLOBIN: 15.7 g/dL (ref 13.0–17.0)
LYMPHS ABS: 3.7 10*3/uL (ref 0.7–4.0)
Lymphocytes Relative: 47.2 % — ABNORMAL HIGH (ref 12.0–46.0)
MCHC: 34.8 g/dL (ref 30.0–36.0)
MCV: 94.1 fl (ref 78.0–100.0)
MONO ABS: 0.7 10*3/uL (ref 0.1–1.0)
Monocytes Relative: 9.6 % (ref 3.0–12.0)
NEUTROS PCT: 41 % — AB (ref 43.0–77.0)
Neutro Abs: 3.2 10*3/uL (ref 1.4–7.7)
Platelets: 194 10*3/uL (ref 150.0–400.0)
RBC: 4.79 Mil/uL (ref 4.22–5.81)
RDW: 14.8 % (ref 11.5–15.5)
WBC: 7.8 10*3/uL (ref 4.0–10.5)

## 2015-12-30 LAB — LIPID PANEL
CHOLESTEROL: 211 mg/dL — AB (ref 0–200)
HDL: 43.8 mg/dL (ref >39.00)
LDL Cholesterol: 149 mg/dL — ABNORMAL HIGH (ref 0–99)
NonHDL: 166.79
TRIGLYCERIDES: 89 mg/dL (ref 0.0–149.0)
Total CHOL/HDL Ratio: 5
VLDL: 17.8 mg/dL (ref 0.0–40.0)

## 2015-12-30 LAB — TSH: TSH: 1.62 u[IU]/mL (ref 0.35–4.50)

## 2015-12-30 LAB — HEPATIC FUNCTION PANEL
ALBUMIN: 4.4 g/dL (ref 3.5–5.2)
ALK PHOS: 73 U/L (ref 39–117)
ALT: 13 U/L (ref 0–53)
AST: 21 U/L (ref 0–37)
Bilirubin, Direct: 0 mg/dL (ref 0.0–0.3)
Total Bilirubin: 0.4 mg/dL (ref 0.2–1.2)
Total Protein: 7.3 g/dL (ref 6.0–8.3)

## 2015-12-30 LAB — BASIC METABOLIC PANEL
BUN: 14 mg/dL (ref 6–23)
CHLORIDE: 102 meq/L (ref 96–112)
CO2: 28 meq/L (ref 19–32)
CREATININE: 0.9 mg/dL (ref 0.40–1.50)
Calcium: 9.5 mg/dL (ref 8.4–10.5)
GFR: 89.91 mL/min (ref >60.00)
Glucose, Bld: 104 mg/dL — ABNORMAL HIGH (ref 70–99)
POTASSIUM: 3.9 meq/L (ref 3.5–5.1)
Sodium: 139 mEq/L (ref 135–145)

## 2015-12-30 LAB — HEPATITIS C ANTIBODY: HCV Ab: NEGATIVE

## 2015-12-30 LAB — VITAMIN B12: VITAMIN B 12: 354 pg/mL (ref 211–911)

## 2016-01-06 ENCOUNTER — Ambulatory Visit (INDEPENDENT_AMBULATORY_CARE_PROVIDER_SITE_OTHER): Payer: Medicare Other | Admitting: Family Medicine

## 2016-01-06 ENCOUNTER — Encounter: Payer: Self-pay | Admitting: Family Medicine

## 2016-01-06 VITALS — BP 100/70 | HR 57 | Ht 63.5 in | Wt 122.1 lb

## 2016-01-06 DIAGNOSIS — E7800 Pure hypercholesterolemia, unspecified: Secondary | ICD-10-CM

## 2016-01-06 DIAGNOSIS — Z7189 Other specified counseling: Secondary | ICD-10-CM | POA: Insufficient documentation

## 2016-01-06 DIAGNOSIS — Z1211 Encounter for screening for malignant neoplasm of colon: Secondary | ICD-10-CM

## 2016-01-06 DIAGNOSIS — I70299 Other atherosclerosis of native arteries of extremities, unspecified extremity: Secondary | ICD-10-CM

## 2016-01-06 DIAGNOSIS — I700 Atherosclerosis of aorta: Secondary | ICD-10-CM

## 2016-01-06 DIAGNOSIS — I708 Atherosclerosis of other arteries: Secondary | ICD-10-CM

## 2016-01-06 DIAGNOSIS — Z0000 Encounter for general adult medical examination without abnormal findings: Secondary | ICD-10-CM | POA: Diagnosis not present

## 2016-01-06 DIAGNOSIS — N400 Enlarged prostate without lower urinary tract symptoms: Secondary | ICD-10-CM

## 2016-01-06 DIAGNOSIS — E785 Hyperlipidemia, unspecified: Secondary | ICD-10-CM | POA: Insufficient documentation

## 2016-01-06 DIAGNOSIS — R202 Paresthesia of skin: Secondary | ICD-10-CM

## 2016-01-06 DIAGNOSIS — F17200 Nicotine dependence, unspecified, uncomplicated: Secondary | ICD-10-CM

## 2016-01-06 DIAGNOSIS — L84 Corns and callosities: Secondary | ICD-10-CM | POA: Diagnosis not present

## 2016-01-06 MED ORDER — ATORVASTATIN CALCIUM 40 MG PO TABS: 40.0000 mg | ORAL_TABLET | Freq: Every day | ORAL | 6 refills | Status: DC

## 2016-01-06 NOTE — Assessment & Plan Note (Signed)
Continue to encourage smoking cessation.  Requests lung cancer screening CT discussion deferred for now.

## 2016-01-06 NOTE — Assessment & Plan Note (Signed)
Advanced directive discussion - discussed. Wife would be HCPOA. Packet provided today.

## 2016-01-06 NOTE — Assessment & Plan Note (Signed)
Followed by urology.   

## 2016-01-06 NOTE — Assessment & Plan Note (Signed)

## 2016-01-06 NOTE — Assessment & Plan Note (Signed)
ASCVD 10 yr risk is 13.2%. Reviewed with patient along with healthy diet changes to improve chol levels. Pt willing to try atorvastatin 40mg  daily -sent to pharmacy. Update if myalgias develop to consider low potency statin. Pt agrees with plan.

## 2016-01-06 NOTE — Assessment & Plan Note (Signed)
Causing significant pain. Failed home treatment. Refer to podiatry.

## 2016-01-06 NOTE — Patient Instructions (Addendum)
We will refer you for colonoscopy.  We will refer you for podiatry for calluses and corns of feet.  Keep working on smoking cessation. Consider cholesterol medicine to lower cholesterol levels.  Advanced directive packet provided today.  Return in 6 months for follow up visit.   Health Maintenance, Male A healthy lifestyle and preventative care can promote health and wellness.  Maintain regular health, dental, and eye exams.  Eat a healthy diet. Foods like vegetables, fruits, whole grains, low-fat dairy products, and lean protein foods contain the nutrients you need and are low in calories. Decrease your intake of foods high in solid fats, added sugars, and salt. Get information about a proper diet from your health care provider, if necessary.  Regular physical exercise is one of the most important things you can do for your health. Most adults should get at least 150 minutes of moderate-intensity exercise (any activity that increases your heart rate and causes you to sweat) each week. In addition, most adults need muscle-strengthening exercises on 2 or more days a week.   Maintain a healthy weight. The body mass index (BMI) is a screening tool to identify possible weight problems. It provides an estimate of body fat based on height and weight. Your health care provider can find your BMI and can help you achieve or maintain a healthy weight. For males 20 years and older:  A BMI below 18.5 is considered underweight.  A BMI of 18.5 to 24.9 is normal.  A BMI of 25 to 29.9 is considered overweight.  A BMI of 30 and above is considered obese.  Maintain normal blood lipids and cholesterol by exercising and minimizing your intake of saturated fat. Eat a balanced diet with plenty of fruits and vegetables. Blood tests for lipids and cholesterol should begin at age 50 and be repeated every 5 years. If your lipid or cholesterol levels are high, you are over age 66, or you are at high risk for heart  disease, you may need your cholesterol levels checked more frequently.Ongoing high lipid and cholesterol levels should be treated with medicines if diet and exercise are not working.  If you smoke, find out from your health care provider how to quit. If you do not use tobacco, do not start.  Lung cancer screening is recommended for adults aged 57-80 years who are at high risk for developing lung cancer because of a history of smoking. A yearly low-dose CT scan of the lungs is recommended for people who have at least a 30-pack-year history of smoking and are current smokers or have quit within the past 15 years. A pack year of smoking is smoking an average of 1 pack of cigarettes a day for 1 year (for example, a 30-pack-year history of smoking could mean smoking 1 pack a day for 30 years or 2 packs a day for 15 years). Yearly screening should continue until the smoker has stopped smoking for at least 15 years. Yearly screening should be stopped for people who develop a health problem that would prevent them from having lung cancer treatment.  If you choose to drink alcohol, do not have more than 2 drinks per day. One drink is considered to be 12 oz (360 mL) of beer, 5 oz (150 mL) of wine, or 1.5 oz (45 mL) of liquor.  Avoid the use of street drugs. Do not share needles with anyone. Ask for help if you need support or instructions about stopping the use of drugs.  High blood  pressure causes heart disease and increases the risk of stroke. High blood pressure is more likely to develop in:  People who have blood pressure in the end of the normal range (100-139/85-89 mm Hg).  People who are overweight or obese.  People who are African American.  If you are 89-25 years of age, have your blood pressure checked every 3-5 years. If you are 76 years of age or older, have your blood pressure checked every year. You should have your blood pressure measured twice-once when you are at a hospital or clinic, and  once when you are not at a hospital or clinic. Record the average of the two measurements. To check your blood pressure when you are not at a hospital or clinic, you can use:  An automated blood pressure machine at a pharmacy.  A home blood pressure monitor.  If you are 10-39 years old, ask your health care provider if you should take aspirin to prevent heart disease.  Diabetes screening involves taking a blood sample to check your fasting blood sugar level. This should be done once every 3 years after age 63 if you are at a normal weight and without risk factors for diabetes. Testing should be considered at a younger age or be carried out more frequently if you are overweight and have at least 1 risk factor for diabetes.  Colorectal cancer can be detected and often prevented. Most routine colorectal cancer screening begins at the age of 22 and continues through age 12. However, your health care provider may recommend screening at an earlier age if you have risk factors for colon cancer. On a yearly basis, your health care provider may provide home test kits to check for hidden blood in the stool. A small camera at the end of a tube may be used to directly examine the colon (sigmoidoscopy or colonoscopy) to detect the earliest forms of colorectal cancer. Talk to your health care provider about this at age 16 when routine screening begins. A direct exam of the colon should be repeated every 5-10 years through age 51, unless early forms of precancerous polyps or small growths are found.  People who are at an increased risk for hepatitis B should be screened for this virus. You are considered at high risk for hepatitis B if:  You were born in a country where hepatitis B occurs often. Talk with your health care provider about which countries are considered high risk.  Your parents were born in a high-risk country and you have not received a shot to protect against hepatitis B (hepatitis B  vaccine).  You have HIV or AIDS.  You use needles to inject street drugs.  You live with, or have sex with, someone who has hepatitis B.  You are a man who has sex with other men (MSM).  You get hemodialysis treatment.  You take certain medicines for conditions like cancer, organ transplantation, and autoimmune conditions.  Hepatitis C blood testing is recommended for all people born from [birth year] through 1965 and any individual with known risk factors for hepatitis C.  Healthy men should no longer receive prostate-specific antigen (PSA) blood tests as part of routine cancer screening. Talk to your health care provider about prostate cancer screening.  Testicular cancer screening is not recommended for adolescents or adult males who have no symptoms. Screening includes self-exam, a health care provider exam, and other screening tests. Consult with your health care provider about any symptoms you have or any concerns you  have about testicular cancer.  Practice safe sex. Use condoms and avoid high-risk sexual practices to reduce the spread of sexually transmitted infections (STIs).  You should be screened for STIs, including gonorrhea and chlamydia if:  You are sexually active and are younger than 24 years.  You are older than 24 years, and your health care provider tells you that you are at risk for this type of infection.  Your sexual activity has changed since you were last screened, and you are at an increased risk for chlamydia or gonorrhea. Ask your health care provider if you are at risk.  If you are at risk of being infected with HIV, it is recommended that you take a prescription medicine daily to prevent HIV infection. This is called pre-exposure prophylaxis (PrEP). You are considered at risk if:  You are a man who has sex with other men (MSM).  You are a heterosexual man who is sexually active with multiple partners.  You take drugs by injection.  You are sexually active  with a partner who has HIV.  Talk with your health care provider about whether you are at high risk of being infected with HIV. If you choose to begin PrEP, you should first be tested for HIV. You should then be tested every 3 months for as long as you are taking PrEP.  Use sunscreen. Apply sunscreen liberally and repeatedly throughout the day. You should seek shade when your shadow is shorter than you. Protect yourself by wearing long sleeves, pants, a wide-brimmed hat, and sunglasses year round whenever you are outdoors.  Tell your health care provider of new moles or changes in moles, especially if there is a change in shape or color. Also, tell your health care provider if a mole is larger than the size of a pencil eraser.  A one-time screening for abdominal aortic aneurysm (AAA) and surgical repair of large AAAs by ultrasound is recommended for men aged 86-75 years who are current or former smokers.  Stay current with your vaccines (immunizations). This information is not intended to replace advice given to you by your health care provider. Make sure you discuss any questions you have with your health care provider. Document Released: 06/30/2007 Document Revised: 01/22/2014 Document Reviewed: 10/05/2014 Elsevier Interactive Patient Education  2017 [employer].

## 2016-01-06 NOTE — Progress Notes (Signed)
BP 100/70   Pulse (!) 57   Ht 5' 3.5" (1.613 m)   Wt 122 lb 1.9 oz (55.4 kg)   SpO2 97%   BMI 21.29 kg/m    CC: welcome to medicare visit Subjective:    Patient ID: Mario Mcdowell, male    DOB: 1950/03/25, 65 y.o.   MRN: [id number]  HPI: Mario Mcdowell is a 65 y.o. male presenting on 01/06/2016 for Medicare Wellness (bilateral feet pain (referral))   PAD (known aortoiliac ATH) - last visit we started aspirin. Pt declined ABIs due to cost concerns. Ongoing foot pain with calluses, bunions and heel pain. Asks about podiatry referral. Has tried callus pad, sandpaper sponge, and razor blade  Hearing screen - failed R ear Vision screen - sees eye doctor Fall risk screen - passed Depression screen - passed  Preventative: Colon cancer screening - discussed, would like colonoscopy. Prostate cancer screening - did have prostate check done last year at urologist's Lung cancer screening - discussed, would like to postpone this discussion for now Flu shot - declines Pneumonia shot - declines Tetanus shot - declines Shingles shot - declines Advanced directive discussion - discussed. Wife would be HCPOA. Packet provided today.  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 Fruits/vegetables daily.   Relevant past medical, surgical, family and social history reviewed and updated as indicated. Interim medical history since our last visit reviewed. Allergies and medications reviewed and updated. Current Outpatient Prescriptions on File Prior to Visit  Medication Sig  . aspirin EC 81 MG tablet Take 1 tablet (81 mg total) by mouth daily.  . Potassium Gluconate 595 MG CAPS Take 1 capsule by mouth every other day.   . ibuprofen (ADVIL,MOTRIN) 400 MG tablet Take 400 mg  by mouth every 6 (six) hours as needed. Takes it as needed   No current facility-administered medications on file prior to visit.     Review of Systems Per HPI unless specifically indicated in ROS section     Objective:    BP 100/70   Pulse (!) 57   Ht 5' 3.5" (1.613 m)   Wt 122 lb 1.9 oz (55.4 kg)   SpO2 97%   BMI 21.29 kg/m   Wt Readings from Last 3 Encounters:  01/06/16 122 lb 1.9 oz (55.4 kg)  10/25/15 119 lb 8 oz (54.2 kg)  09/05/15 119 lb 8 oz (54.2 kg)    Physical Exam  Constitutional: He is oriented to person, place, and time. He appears well-developed and well-nourished. No distress.  HENT:  Head: Normocephalic and atraumatic.  Right Ear: Hearing, tympanic membrane, external ear and ear canal normal.  Left Ear: Hearing, tympanic membrane, external ear and ear canal normal.  Nose: Nose normal.  Mouth/Throat: Uvula is midline, oropharynx is clear and moist and mucous membranes are normal. No oropharyngeal exudate, posterior oropharyngeal edema or posterior oropharyngeal erythema.  Eyes: Conjunctivae and EOM are normal. Pupils are equal, round, and reactive to light. No scleral icterus.  Neck: Normal range of motion. Neck supple.  Cardiovascular: Normal rate, regular rhythm, normal heart sounds and intact distal pulses.   No murmur heard. Pulses:      Radial pulses are 2+ on the right side, and 2+ on the left side.  Pulmonary/Chest: Effort  normal and breath sounds normal. No respiratory distress. He has no wheezes. He has no rales.  Abdominal: Soft. Bowel sounds are normal. He exhibits no distension and no mass. There is no tenderness. There is no rebound and no guarding.  Musculoskeletal: Normal range of motion. He exhibits no edema.  Lymphadenopathy:    He has no cervical adenopathy.  Neurological: He is alert and oriented to person, place, and time.  CN grossly intact, station and gait intact  Skin: Skin is warm and dry. No rash noted.  Skin thickening bilateral  soles with several corns and calluses throughout  Psychiatric: He has a normal mood and affect. His behavior is normal. Judgment and thought content normal.  Nursing note and vitals reviewed.  Results for orders placed or performed in visit on 12/30/15  Lipid panel  Result Value Ref Range   Cholesterol 211 (H) 0 - 200 mg/dL   Triglycerides 89.0 0.0 - 149.0 mg/dL   HDL 43.80 >39.00 mg/dL   VLDL 17.8 0.0 - 40.0 mg/dL   LDL Cholesterol 149 (H) 0 - 99 mg/dL   Total CHOL/HDL Ratio 5    NonHDL 123456   Basic metabolic panel  Result Value Ref Range   Sodium 139 135 - 145 mEq/L   Potassium 3.9 3.5 - 5.1 mEq/L   Chloride 102 96 - 112 mEq/L   CO2 28 19 - 32 mEq/L   Glucose, Bld 104 (H) 70 - 99 mg/dL   BUN 14 6 - 23 mg/dL   Creatinine, Ser 0.90 0.40 - 1.50 mg/dL   Calcium 9.5 8.4 - 10.5 mg/dL   GFR 89.91 >60.00 mL/min  Hepatitis C antibody  Result Value Ref Range   HCV Ab NEGATIVE NEGATIVE  Hepatic function panel  Result Value Ref Range   Total Bilirubin 0.4 0.2 - 1.2 mg/dL   Bilirubin, Direct 0.0 0.0 - 0.3 mg/dL   Alkaline Phosphatase 73 39 - 117 U/L   AST 21 0 - 37 U/L   ALT 13 0 - 53 U/L   Total Protein 7.3 6.0 - 8.3 g/dL   Albumin 4.4 3.5 - 5.2 g/dL  CBC with Differential/Platelet  Result Value Ref Range   WBC 7.8 4.0 - 10.5 K/uL   RBC 4.79 4.22 - 5.81 Mil/uL   Hemoglobin 15.7 13.0 - 17.0 g/dL   HCT 45.0 39.0 - 52.0 %   MCV 94.1 78.0 - 100.0 fl   MCHC 34.8 30.0 - 36.0 g/dL   RDW 14.8 11.5 - 15.5 %   Platelets 194.0 150.0 - 400.0 K/uL   Neutrophils Relative % 41.0 (L) 43.0 - 77.0 %   Lymphocytes Relative 47.2 (H) 12.0 - 46.0 %   Monocytes Relative 9.6 3.0 - 12.0 %   Eosinophils Relative 1.8 0.0 - 5.0 %   Basophils Relative 0.4 0.0 - 3.0 %   Neutro Abs 3.2 1.4 - 7.7 K/uL   Lymphs Abs 3.7 0.7 - 4.0 K/uL   Monocytes Absolute 0.7 0.1 - 1.0 K/uL   Eosinophils Absolute 0.1 0.0 - 0.7 K/uL   Basophils Absolute 0.0 0.0 - 0.1 K/uL  Vitamin B12  Result Value Ref Range    Vitamin B-12 354 211 - 911 pg/mL  TSH  Result Value Ref Range   TSH 1.62 0.35 - 4.50 uIU/mL   EKG - NSR rate 60s, normal axis, intervals, no acute ST/T changes    Assessment & Plan:   Problem List Items Addressed This Visit    Advanced care planning/counseling discussion  Advanced directive discussion - discussed. Wife would be HCPOA. Packet provided today.       Aorto-iliac atherosclerosis ([place])    See below. Trial atorvastatin 40mg  daily.  Continues to decline ABI      Relevant Medications   atorvastatin (LIPITOR) 40 MG tablet   BPH without obstruction/lower urinary tract symptoms    Followed by urology.       Corns and callus    Causing significant pain. Failed home treatment. Refer to podiatry.       Relevant Orders   Ambulatory referral to Podiatry   HLD (hyperlipidemia)    ASCVD 10 yr risk is 13.2%. Reviewed with patient along with healthy diet changes to improve chol levels. Pt willing to try atorvastatin 40mg  daily -sent to pharmacy. Update if myalgias develop to consider low potency statin. Pt agrees with plan.      Relevant Medications   atorvastatin (LIPITOR) 40 MG tablet   Paresthesia of right foot    Recent labs for periph neuropathy unrevealing.       Smoker    Continue to encourage smoking cessation.  Requests lung cancer screening CT discussion deferred for now.       Welcome to Medicare preventive visit - Primary    I have personally reviewed the Medicare Annual Wellness questionnaire and have noted 1. The patient's medical and social history 2. Their use of alcohol, tobacco or illicit drugs 3. Their current medications and supplements 4. The patient's functional ability including ADL's, fall risks, home safety risks and hearing or visual impairment. Cognitive function has been assessed and addressed as indicated.  5. Diet and physical activity 6. Evidence for depression or mood disorders The patients weight, height, BMI have been recorded in  the chart. I have made referrals, counseling and provided education to the patient based on review of the above and I have provided the pt with a written personalized care plan for preventive services. Provider list updated.. See scanned questionairre as needed for further documentation. Reviewed preventative protocols and updated unless pt declined.       Relevant Orders   EKG 12-Lead (Completed)    Other Visit Diagnoses    Special screening for malignant neoplasms, colon       Relevant Orders   Ambulatory referral to Gastroenterology       Follow up plan: Return in about 6 months (around 07/06/2016) for follow up visit.  Ria Bush, MD

## 2016-01-06 NOTE — Assessment & Plan Note (Signed)
Recent labs for periph neuropathy unrevealing.

## 2016-01-06 NOTE — Assessment & Plan Note (Addendum)
See below. Trial atorvastatin 40mg  daily.  Continues to decline ABI

## 2016-01-10 ENCOUNTER — Encounter: Payer: Self-pay | Admitting: Gastroenterology

## 2016-01-12 ENCOUNTER — Encounter: Payer: Self-pay | Admitting: Family Medicine

## 2016-01-12 DIAGNOSIS — H16041 Marginal corneal ulcer, right eye: Secondary | ICD-10-CM | POA: Diagnosis not present

## 2016-01-27 ENCOUNTER — Encounter: Payer: Self-pay | Admitting: Podiatry

## 2016-01-27 ENCOUNTER — Ambulatory Visit (INDEPENDENT_AMBULATORY_CARE_PROVIDER_SITE_OTHER): Payer: Medicare Other | Admitting: Podiatry

## 2016-01-27 VITALS — BP 161/66 | HR 78 | Resp 18

## 2016-01-27 DIAGNOSIS — M79672 Pain in left foot: Secondary | ICD-10-CM

## 2016-01-27 DIAGNOSIS — Q828 Other specified congenital malformations of skin: Secondary | ICD-10-CM

## 2016-01-27 DIAGNOSIS — M79671 Pain in right foot: Secondary | ICD-10-CM | POA: Diagnosis not present

## 2016-01-27 NOTE — Progress Notes (Signed)
   Subjective:    Patient ID: Mario Mcdowell, male    DOB: 1951-01-06, 66 y.o.   MRN: [id number]  HPI  66 year old male presents the also concerns of painful calluses to both of his feet. This been ongoing over several years. He tried to trim them himself. As they grow to get thicker he starts to have pain. Denies a drainage or pus any swelling. He has not changes shoes but he has tried a gel insert which helps some but as he walks a lot of does not help. He has no other complaints today. Denies any claudication symptoms. No numbness or tingling.   Review of Systems  All other systems reviewed and are negative.      Objective:   Physical Exam General: AAO x3, NAD  Dermatological: Thick hyperkeratotic lesions bilateral sub-metatarsal 5, medial hallux as well as left heel. Upon debridement there is no underlying ulceration, drainage or any signs of infection. There is no other open lesions or pre-ulcer lesions identified today.  Vascular: Dorsalis Pedis artery and Posterior Tibial artery pedal pulses are 2/4 bilateral with immedate capillary fill time. There is no pain with calf compression, swelling, warmth, erythema.   Neruologic: Grossly intact via light touch bilateral. Vibratory intact via tuning fork bilateral. Protective threshold with Semmes Wienstein monofilament intact to all pedal sites bilateral.   Musculoskeletal: No pain, crepitus, or limitation noted with foot and ankle range of motion bilateral. Muscular strength 5/5 in all groups tested bilateral.  Gait: Unassisted, Nonantalgic.      Assessment & Plan:  Symptomatic hyperkeratotic lesions bilateral -Treatment options discussed including all alternatives, risks, and complications -Etiology of symptoms were discussed -Hyperkeratotic lesions were debrided 5 without complications or bleeding. Given the thickening of bilateral foot metatarsal 5 as well as left heel recommended further treatment. The areas was cleaned and a palmar  displaced followed by salinociane.post procedure tractions were discussed. Monitor for infection. -Follow up as scheduled. Call any questions concerns meantime.  *X-ray next appointment.  Celesta Gentile, DPM

## 2016-02-24 ENCOUNTER — Ambulatory Visit (INDEPENDENT_AMBULATORY_CARE_PROVIDER_SITE_OTHER): Payer: Medicare Other

## 2016-02-24 ENCOUNTER — Encounter: Payer: Self-pay | Admitting: Podiatry

## 2016-02-24 ENCOUNTER — Ambulatory Visit (INDEPENDENT_AMBULATORY_CARE_PROVIDER_SITE_OTHER): Payer: Medicare Other | Admitting: Podiatry

## 2016-02-24 VITALS — BP 113/47 | HR 63 | Resp 18

## 2016-02-24 DIAGNOSIS — Q828 Other specified congenital malformations of skin: Secondary | ICD-10-CM | POA: Diagnosis not present

## 2016-02-24 DIAGNOSIS — M779 Enthesopathy, unspecified: Secondary | ICD-10-CM

## 2016-02-27 ENCOUNTER — Ambulatory Visit (AMBULATORY_SURGERY_CENTER): Payer: Self-pay | Admitting: *Deleted

## 2016-02-27 VITALS — Ht 65.0 in | Wt 119.0 lb

## 2016-02-27 DIAGNOSIS — Z1211 Encounter for screening for malignant neoplasm of colon: Secondary | ICD-10-CM

## 2016-02-27 MED ORDER — NA SULFATE-K SULFATE-MG SULF 17.5-3.13-1.6 GM/177ML PO SOLN: ORAL | 0 refills | Status: DC

## 2016-02-27 NOTE — Progress Notes (Signed)
Wife at side during PV today. Patient denies any allergies to eggs or soy. Patient denies any problems with anesthesia/sedation. Patient denies any oxygen use at home and does not take any diet/weight loss medications. EMMI education declined by patient.

## 2016-02-28 ENCOUNTER — Encounter: Payer: Self-pay | Admitting: Gastroenterology

## 2016-02-29 NOTE — Progress Notes (Signed)
Subjective: 66 year old male presents to the office today for concerns of painful calluses to both of his feet that he would like trimmed. He states that after they get trimmed they do feel better but then it starts to come back. He is asking if there is any surgery that can help. Denies any systemic complaints such as fevers, chills, nausea, vomiting. No acute changes since last appointment, and no other complaints at this time.   Objective: AAO x3, NAD DP/PT pulses palpable bilaterally, CRT less than 3 seconds Protective sensation intact with Derrel Nip monofilament Hyperkeratotic lesions continue bilateral some metatarsal 5, medial hallux, left heel. Once the causing the most problems of left heel and right hallux. Upon debridement there is no underlying ulceration, drainage or any signs of infection. No areas of pinpoint bony tenderness or pain with vibratory sensation. MMT 5/5, ROM WNL. No edema, erythema, increase in warmth to bilateral lower extremities.  No open lesions or pre-ulcerative lesions.  No pain with calf compression, swelling, warmth, erythema  Assessment: Hyperkeratotic lesions  Plan:  -All treatment options discussed with the patient including all alternatives, risks, complications.  -X-rays were obtained and reviewed. There is no significant bony exostosis is no evidence of acute fracture. -He has attempted shoe changes, inserts without any improvement. I discussed with him surgical intervention including shaving the bone underneath the calluses to see if this will help. He cannot do surgery at this time. Hyperkeratotic lesions are sharply debrided 5 today without any complications or bleeding. -Follow-up as needed.  -Patient encouraged to call the office with any questions, concerns, change in symptoms.   Celesta Gentile, DPM

## 2016-03-02 ENCOUNTER — Telehealth: Payer: Self-pay | Admitting: Gastroenterology

## 2016-03-02 ENCOUNTER — Encounter: Payer: Self-pay | Admitting: Gastroenterology

## 2016-03-02 NOTE — Telephone Encounter (Signed)
Error

## 2016-03-05 NOTE — Telephone Encounter (Signed)
Left a message for patient to return my call. 

## 2016-03-07 NOTE — Telephone Encounter (Signed)
Left a message for patient to return my call. 

## 2016-03-12 ENCOUNTER — Encounter: Payer: Self-pay | Admitting: Gastroenterology

## 2016-03-12 ENCOUNTER — Other Ambulatory Visit: Payer: Self-pay

## 2016-03-12 ENCOUNTER — Ambulatory Visit (AMBULATORY_SURGERY_CENTER): Payer: Medicare Other | Admitting: Gastroenterology

## 2016-03-12 ENCOUNTER — Other Ambulatory Visit (INDEPENDENT_AMBULATORY_CARE_PROVIDER_SITE_OTHER): Payer: Medicare Other

## 2016-03-12 VITALS — BP 132/64 | HR 64 | Temp 98.4°F | Resp 11 | Ht 65.0 in | Wt 118.0 lb

## 2016-03-12 DIAGNOSIS — Z1212 Encounter for screening for malignant neoplasm of rectum: Secondary | ICD-10-CM | POA: Diagnosis not present

## 2016-03-12 DIAGNOSIS — K635 Polyp of colon: Secondary | ICD-10-CM

## 2016-03-12 DIAGNOSIS — R933 Abnormal findings on diagnostic imaging of other parts of digestive tract: Secondary | ICD-10-CM | POA: Diagnosis not present

## 2016-03-12 DIAGNOSIS — D128 Benign neoplasm of rectum: Secondary | ICD-10-CM

## 2016-03-12 DIAGNOSIS — D125 Benign neoplasm of sigmoid colon: Secondary | ICD-10-CM

## 2016-03-12 DIAGNOSIS — D126 Benign neoplasm of colon, unspecified: Secondary | ICD-10-CM | POA: Diagnosis not present

## 2016-03-12 DIAGNOSIS — K621 Rectal polyp: Secondary | ICD-10-CM | POA: Diagnosis not present

## 2016-03-12 DIAGNOSIS — Z1211 Encounter for screening for malignant neoplasm of colon: Secondary | ICD-10-CM

## 2016-03-12 DIAGNOSIS — D123 Benign neoplasm of transverse colon: Secondary | ICD-10-CM

## 2016-03-12 HISTORY — PX: COLONOSCOPY: SHX174

## 2016-03-12 LAB — CREATININE, SERUM: Creatinine, Ser: 0.91 mg/dL (ref 0.40–1.50)

## 2016-03-12 MED ORDER — SODIUM CHLORIDE 0.9 % IV SOLN: 500.0000 mL | INTRAVENOUS | Status: DC

## 2016-03-12 NOTE — Progress Notes (Signed)
Report to PACU, RN, vss, BBS= Clear.  

## 2016-03-12 NOTE — Op Note (Signed)
Mario Mcdowell Patient Name: Mario Mcdowell Procedure Date: 03/12/2016 8:26 AM MRN: [id number] Endoscopist: Ladene Artist , MD Age: 66 Referring MD:  Date of Birth: 12-24-1950 Gender: Male Account #: [id number] Procedure:                Colonoscopy Indications:              Screening for colorectal malignant neoplasm Medicines:                Monitored Anesthesia Care Procedure:                Pre-Anesthesia Assessment:                           - Prior to the procedure, a History and Physical                            was performed, and patient medications and                            allergies were reviewed. The patient's tolerance of                            previous anesthesia was also reviewed. The risks                            and benefits of the procedure and the sedation                            options and risks were discussed with the patient.                            All questions were answered, and informed consent                            was obtained. Prior Anticoagulants: The patient has                            taken no previous anticoagulant or antiplatelet                            agents. ASA Grade Assessment: II - A patient with                            mild systemic disease. After reviewing the risks                            and benefits, the patient was deemed in                            satisfactory condition to undergo the procedure.                           After obtaining informed consent, the colonoscope  was passed under direct vision. Throughout the                            procedure, the patient's blood pressure, pulse, and                            oxygen saturations were monitored continuously. The                            Model PCF-H190DL [id number]) scope was introduced                            through the anus and advanced to the the cecum,                            identified by  appendiceal orifice and ileocecal                            valve. The ileocecal valve, appendiceal orifice,                            and rectum were photographed. The quality of the                            bowel preparation was adequate. The colonoscopy was                            performed without difficulty. The patient tolerated                            the procedure well. Scope In: 8:33:40 AM Scope Out: 9:05:20 AM Scope Withdrawal Time: 0 hours 27 minutes 28 seconds  Total Procedure Duration: 0 hours 31 minutes 40 seconds  Findings:                 The perianal and digital rectal examinations were                            normal.                           Four sessile polyps were found in the rectum (1)                            and transverse colon (3). The polyps were 6 to 8 mm                            in size. These polyps were removed with a cold                            snare. Resection and retrieval were complete.                           A fungating non-obstructing mass was found in the  sigmoid colon-location was on the inner radius of a                            sharp turn and difficult to visualize. Unable to                            position adequately to attempt polypectomy. The                            mass was non-circumferential. The mass measured                            three cm in length. In addition, its diameter                            measured twenty-five to thirty mm. No bleeding was                            present. Biopsies were taken with a cold forceps                            for histology. Area was tattooed with an injection                            of 4 mL of Spot (carbon black) both proximal to and                            distal the lesion.                           Internal hemorrhoids were found during                            retroflexion. The hemorrhoids were small and Grade                             I (internal hemorrhoids that do not prolapse).                           The exam was otherwise without abnormality on                            direct and retroflexion views. Complications:            No immediate complications. Estimated blood loss:                            None. Estimated Blood Loss:     Estimated blood loss: none. Impression:               - Four 6 to 8 mm polyps in the rectum and in the                            transverse colon, removed with a cold  snare.                            Resected and retrieved.                           - Rule out malignancy, tumor in the sigmoid colon.                            Biopsied. Tattooed.                           - Internal hemorrhoids.                           - The examination was otherwise normal on direct                            and retroflexion views. Recommendation:           - Repeat colonoscopy date to be determined after                            pending pathology results are reviewed for                            surveillance based on pathology results.                           - Patient has a contact number available for                            emergencies. The signs and symptoms of potential                            delayed complications were discussed with the                            patient. Return to normal activities tomorrow.                            Written discharge instructions were provided to the                            patient.                           - Resume previous diet.                           - Continue present medications.                           - Await pathology results.                           - Perform a CT scan (computed tomography) of  abdomen with contrast and pelvis with contrast at                            the next available appointment.                           - Refer to a colo-rectal surgeon at the next                             available appointment. Ladene Artist, MD 03/12/2016 9:20:46 AM This report has been signed electronically.

## 2016-03-12 NOTE — Progress Notes (Signed)
Called to room to assist during endoscopic procedure.  Patient ID and intended procedure confirmed with present staff. Received instructions for my participation in the procedure from the performing physician.  

## 2016-03-12 NOTE — Progress Notes (Signed)
Path sent normal route per MD

## 2016-03-12 NOTE — Patient Instructions (Signed)
Impression/Recommendations:  Polyp handout given to patient. Hemorrhoid handout given to patient.  Repeat colonosocpy date to be determined after pathology results are reviewed.  Perform CT scan of abdomen with contrast and pelvis with contrast at next available appointment. Refer to colo-rectal surgeon at next available appointment.  YOU HAD AN ENDOSCOPIC PROCEDURE TODAY AT [facility]:   Refer to the procedure report that was given to you for any specific questions about what was found during the examination.  If the procedure report does not answer your questions, please call your gastroenterologist to clarify.  If you requested that your care partner not be given the details of your procedure findings, then the procedure report has been included in a sealed envelope for you to review at your convenience later.  YOU SHOULD EXPECT: Some feelings of bloating in the abdomen. Passage of more gas than usual.  Walking can help get rid of the air that was put into your GI tract during the procedure and reduce the bloating. If you had a lower endoscopy (such as a colonoscopy or flexible sigmoidoscopy) you may notice spotting of blood in your stool or on the toilet paper. If you underwent a bowel prep for your procedure, you may not have a normal bowel movement for a few days.  Please Note:  You might notice some irritation and congestion in your nose or some drainage.  This is from the oxygen used during your procedure.  There is no need for concern and it should clear up in a day or so.  SYMPTOMS TO REPORT IMMEDIATELY:   Following lower endoscopy (colonoscopy or flexible sigmoidoscopy):  Excessive amounts of blood in the stool  Significant tenderness or worsening of abdominal pains  Swelling of the abdomen that is new, acute  Fever of 100F or higher For urgent or emergent issues, a gastroenterologist can be reached at any hour by calling [phone].   DIET:  We do  recommend a small meal at first, but then you may proceed to your regular diet.  Drink plenty of fluids but you should avoid alcoholic beverages for 24 hours.  ACTIVITY:  You should plan to take it easy for the rest of today and you should NOT DRIVE or use heavy machinery until tomorrow (because of the sedation medicines used during the test).    FOLLOW UP: Our staff will call the number listed on your records the next business day following your procedure to check on you and address any questions or concerns that you may have regarding the information given to you following your procedure. If we do not reach you, we will leave a message.  However, if you are feeling well and you are not experiencing any problems, there is no need to return our call.  We will assume that you have returned to your regular daily activities without incident.  If any biopsies were taken you will be contacted by phone or by letter within the next 1-3 weeks.  Please call us at [phone] if you have not heard about the biopsies in 3 weeks.    SIGNATURES/CONFIDENTIALITY: You and/or your care partner have signed paperwork which will be entered into your electronic medical record.  These signatures attest to the fact that that the information above on your After Visit Summary has been reviewed and is understood.  Full responsibility of the confidentiality of this discharge information lies with you and/or your care-partner.

## 2016-03-13 ENCOUNTER — Telehealth: Payer: Self-pay

## 2016-03-13 DIAGNOSIS — K6389 Other specified diseases of intestine: Secondary | ICD-10-CM

## 2016-03-13 NOTE — Telephone Encounter (Signed)
Patient notified of the CT scan abd/pelvis that was recommended at the time of his colonoscopy.  He is scheduled for 03/14/16 at4:00 at [facility]. He verbalized understanding of instructions.  Surgical referral faxed.  Patient notified that he will be contacted directly with an appt

## 2016-03-13 NOTE — Telephone Encounter (Signed)
Patient has been scheduled to see Dr. [name] on 03/26/16 3:00

## 2016-03-13 NOTE — Telephone Encounter (Signed)
  Follow up Call-  Call back number 03/12/2016  Post procedure Call Back phone  # [phone]  Permission to leave phone message Yes  Some recent data might be hidden     Patient questions:  Do you have a fever, pain , or abdominal swelling? No. Pain Score  0 *  Have you tolerated food without any problems? Yes.    Have you been able to return to your normal activities? Yes.    Do you have any questions about your discharge instructions: Diet   No. Medications  No. Follow up visit  No.  Do you have questions or concerns about your Care? No.  Actions: * If pain score is 4 or above: No action needed, pain <4.

## 2016-03-14 ENCOUNTER — Ambulatory Visit (INDEPENDENT_AMBULATORY_CARE_PROVIDER_SITE_OTHER)
Admission: RE | Admit: 2016-03-14 | Discharge: 2016-03-14 | Disposition: A | Discharge status: Home / Self Care | Payer: Medicare Other | Source: Ambulatory Visit | Attending: Gastroenterology | Admitting: Gastroenterology

## 2016-03-14 DIAGNOSIS — K639 Disease of intestine, unspecified: Secondary | ICD-10-CM

## 2016-03-14 DIAGNOSIS — K6389 Other specified diseases of intestine: Secondary | ICD-10-CM

## 2016-03-14 DIAGNOSIS — R935 Abnormal findings on diagnostic imaging of other abdominal regions, including retroperitoneum: Secondary | ICD-10-CM | POA: Diagnosis not present

## 2016-03-14 MED ORDER — IOPAMIDOL (ISOVUE-300) INJECTION 61%
100.0000 mL | Freq: Once | INTRAVENOUS | Status: AC | PRN
  Administered 2016-03-14: 100 mL via INTRAVENOUS

## 2016-03-15 ENCOUNTER — Encounter: Payer: Self-pay | Admitting: Internal Medicine

## 2016-03-15 ENCOUNTER — Ambulatory Visit (INDEPENDENT_AMBULATORY_CARE_PROVIDER_SITE_OTHER): Payer: Medicare Other | Admitting: Internal Medicine

## 2016-03-15 VITALS — BP 126/68 | HR 60 | Temp 97.5°F | Wt 120.0 lb

## 2016-03-15 DIAGNOSIS — H6121 Impacted cerumen, right ear: Secondary | ICD-10-CM

## 2016-03-15 NOTE — Patient Instructions (Signed)
Earwax Buildup Your ears make a substance called earwax. It may also be called cerumen. Sometimes, too much earwax builds up in your ear canal. This can cause ear pain and make it harder for you to hear. CAUSES This condition is caused by too much earwax production or buildup. RISK FACTORS The following factors may make you more likely to develop this condition:  Cleaning your ears often with swabs.  Having narrow ear canals.  Having earwax that is overly thick or sticky.  Having eczema.  Being dehydrated. SYMPTOMS Symptoms of this condition include:  Reduced hearing.  Ear drainage.  Ear pain.  Ear itch.  A feeling of fullness in the ear or feeling that the ear is plugged.  Ringing in the ear.  Coughing. DIAGNOSIS Your health care provider can diagnose this condition based on your symptoms and medical history. Your health care provider will also do an ear exam to look inside your ear with a scope (otoscope). You may also have a hearing test. TREATMENT Treatment for this condition includes:  Over-the-counter or prescription ear drops to soften the earwax.  Earwax removal by a health care provider. This may be done:  By flushing the ear with body-temperature water.  With a medical instrument that has a loop at the end (earwax curette).  With a suction device. HOME CARE INSTRUCTIONS  Take over-the-counter and prescription medicines only as told by your health care provider.  Do not put any objects, including an ear swab, into your ear. You can clean the opening of your ear canal with a washcloth.  Drink enough water to keep your urine clear or pale yellow.  If you have frequent earwax buildup or you use hearing aids, consider seeing your health care provider every 6-12 months for routine preventive ear cleanings. Keep all follow-up visits as told by your health care provider. SEEK MEDICAL CARE IF:  You have ear pain.  Your condition does not improve with  treatment.  You have hearing loss.  You have blood, pus, or other fluid coming from your ear. This information is not intended to replace advice given to you by your health care provider. Make sure you discuss any questions you have with your health care provider. Document Released: 02/09/2004 Document Revised: 04/25/2015 Document Reviewed: 08/18/2014 Elsevier Interactive Patient Education  2017 [employer]vier Inc.  

## 2016-03-15 NOTE — Progress Notes (Signed)
Subjective:    Patient ID: Mario Mcdowell, male    DOB: [birth year], 66 y.o.   MRN: [id number]  HPI  Pt presents to the clinic today with c/o wax buildup of right ear. This is an intermittent issue for him. He denies pain, pressure or decreased hearing. He has tried Debrox OTC with minimal relief.   Review of Systems      Past Medical History:  Diagnosis Date  . Aorto-iliac atherosclerosis ([place])    by CT  . BPH without obstruction/lower urinary tract symptoms 10/25/2015  . Right kidney stone 07/2015   with mild R hydronephrosis s/p spontanous passage  . Smoker   . Squamous cell cancer of lip     Current Outpatient Prescriptions  Medication Sig Dispense Refill  . aspirin EC 81 MG tablet Take 1 tablet (81 mg total) by mouth daily.     Current Facility-Administered Medications  Medication Dose Route Frequency Provider Last Rate Last Dose  . 0.9 %  sodium chloride infusion  500 mL Intravenous Continuous Ladene Artist, MD        Allergies  Allergen Reactions  . Penicillins Swelling    Has patient had a PCN reaction causing immediate rash, facial/tongue/throat swelling, SOB or lightheadedness with hypotension: Yes Has patient had a PCN reaction causing severe rash involving mucus membranes or skin necrosis: No Has patient had a PCN reaction that required hospitalization No Has patient had a PCN reaction occurring within the last 10 years: No If all of the above answers are "NO", then may proceed with Cephalosporin use.     Family History  Problem Relation Age of Onset  . Cancer Paternal Aunt     unsure details  . Diabetes Cousin   . Prostate cancer Neg Hx   . Kidney cancer Neg Hx   . CAD Neg Hx   . Stroke Neg Hx   . Colon cancer Neg Hx     Social History   Social History  . Marital status: Married    Spouse name: N/A  . Number of children: N/A  . Years of education: N/A   Occupational History  . Not on file.   Social History Main Topics  . Smoking status:  Current Every Day Smoker    Packs/day: 1.50    Years: 46.00    Types: Cigarettes  . Smokeless tobacco: Never Used  . Alcohol use No     Comment: quit 2000  . Drug use: No  . Sexual activity: No   Other Topics Concern  . Not on file   Social History Narrative   Lives with wife, adopted daughter, adopted son   Occ: carpenter   Edu: 8th grade      HEENT: Pt reports wax buildup.  Denies eye pain, eye redness, ear pain, ringing in the ears, runny nose, nasal congestion, bloody nose, or sore throat.  No other specific complaints in a complete review of systems (except as listed in HPI above).  Objective:   Physical Exam   BP 126/68   Pulse 60   Temp 97.5 F (36.4 C) (Oral)   Wt 120 lb (54.4 kg)   SpO2 97%   BMI 19.97 kg/m  Wt Readings from Last 3 Encounters:  03/15/16 120 lb (54.4 kg)  03/12/16 118 lb (53.5 kg)  02/27/16 119 lb (54 kg)    General: Appears her stated age, in NAD. HEENT: Right Ear: Cerumen impaction; Left Ear: Tm's gray and intact, normal light reflex;  BMET    Component Value Date/Time   NA 139 12/30/2015 0826   K 3.9 12/30/2015 0826   CL 102 12/30/2015 0826   CO2 28 12/30/2015 0826   GLUCOSE 104 (H) 12/30/2015 0826   BUN 14 12/30/2015 0826   CREATININE 0.91 03/12/2016 0950   CALCIUM 9.5 12/30/2015 0826   GFRNONAA >60 08/08/2015 1916   GFRAA >60 08/08/2015 1916    Lipid Panel     Component Value Date/Time   CHOL 211 (H) 12/30/2015 0826   TRIG 89.0 12/30/2015 0826   HDL 43.80 12/30/2015 0826   CHOLHDL 5 12/30/2015 0826   VLDL 17.8 12/30/2015 0826   LDLCALC 149 (H) 12/30/2015 0826    CBC    Component Value Date/Time   WBC 7.8 12/30/2015 0826   RBC 4.79 12/30/2015 0826   HGB 15.7 12/30/2015 0826   HCT 45.0 12/30/2015 0826   PLT 194.0 12/30/2015 0826   MCV 94.1 12/30/2015 0826   MCH 32.6 08/08/2015 1916   MCHC 34.8 12/30/2015 0826   RDW 14.8 12/30/2015 0826   LYMPHSABS 3.7 12/30/2015 0826   MONOABS 0.7 12/30/2015 0826    EOSABS 0.1 12/30/2015 0826   BASOSABS 0.0 12/30/2015 0826    Hgb A1C No results found for: HGBA1C         Assessment & Plan:   Cerumen Impaction, Right Ear:  Manual lavage by CMA Advised him to try Debrox 2 x week to prevent wax buildup  RTC as needed or if symptoms persist or worsen Webb Silversmith, NP

## 2016-04-01 ENCOUNTER — Encounter: Payer: Self-pay | Admitting: Family Medicine

## 2016-04-10 ENCOUNTER — Other Ambulatory Visit: Payer: Self-pay | Admitting: General Surgery

## 2016-04-10 DIAGNOSIS — K639 Disease of intestine, unspecified: Secondary | ICD-10-CM | POA: Diagnosis not present

## 2016-04-10 NOTE — H&P (Signed)
History of Present Illness [name] MD; 9/89/2119 12:36 PM) Patient words: New-Sigmoid Colon Mass.  The patient is a 66 year old male who presents with colorectal cancer. 66 year old male who presents to the office for evaluation of a rectosigmoid colon mass. 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 He denies any changes in bowel habits or rectal bleeding. Colonoscopy was performed for screening purposes. His weight has been stable. He is a long-time 1 pack per day smoker. He is otherwise healthy. He denies abdominal pain. he has never had any abdominal surgeries.   Past Surgical History Malachi Bonds, CMA; 04/10/2016 11:11 AM) Colon Polyp Removal - Colonoscopy  Diagnostic Studies History Malachi Bonds, CMA; 04/10/2016 11:11 AM) Colonoscopy within last year  Allergies Malachi Bonds, CMA; 04/10/2016 11:14 AM) Penicillin G Sodium *PENICILLINS*  Medication History (Chemira Jones, CMA; 04/10/2016 11:14 AM) Aspirin (81MG  Tablet, Oral) Active. Medications Reconciled  Social History Malachi Bonds, CMA; 04/10/2016 11:11 AM) Alcohol use Remotely quit alcohol use. Caffeine use Carbonated beverages, Coffee, Tea. No drug use Tobacco use Current every day smoker.  Family History Malachi Bonds, CMA; 04/10/2016 11:11 AM) Family history unknown First Degree Relatives  Other Problems Malachi Bonds, CMA; 04/10/2016 11:11 AM) Kidney Stone Melanoma     Review of Systems Malachi Bonds CMA; 04/10/2016 11:11 AM) General Not Present- Appetite Loss, Chills, Fatigue, Fever, Night Sweats, Weight Gain and Weight Loss. Skin Not Present- Change in Wart/Mole, Dryness, Hives, Jaundice, New Lesions, Non-Healing Wounds, Rash and Ulcer. HEENT Present- Wears glasses/contact lenses. Not Present- Earache, Hearing Loss, Hoarseness, Nose Bleed, Oral Ulcers, Ringing in the Ears, Seasonal Allergies, Sinus Pain, Sore Throat, Visual Disturbances and Yellow Eyes. Breast Not  Present- Breast Mass, Breast Pain, Nipple Discharge and Skin Changes. Cardiovascular Present- Leg Cramps. Not Present- Chest Pain, Difficulty Breathing Lying Down, Palpitations, Rapid Heart Rate, Shortness of Breath and Swelling of Extremities. Gastrointestinal Not Present- Abdominal Pain, Bloating, Bloody Stool, Change in Bowel Habits, Chronic diarrhea, Constipation, Difficulty Swallowing, Excessive gas, Gets full quickly at meals, Hemorrhoids, Indigestion, Nausea, Rectal Pain and Vomiting. Male Genitourinary Not Present- Blood in Urine, Change in Urinary Stream, Frequency, Impotence, Nocturia, Painful Urination, Urgency and Urine Leakage. Musculoskeletal Not Present- Back Pain, Joint Pain, Joint Stiffness, Muscle Pain, Muscle Weakness and Swelling of Extremities. Neurological Not Present- Decreased Memory, Fainting, Headaches, Numbness, Seizures, Tingling, Tremor, Trouble walking and Weakness. Psychiatric Not Present- Anxiety, Bipolar, Change in Sleep Pattern, Depression, Fearful and Frequent crying. Endocrine Not Present- Cold Intolerance, Excessive Hunger, Hair Changes, Heat Intolerance, Hot flashes and New Diabetes. Hematology Present- Blood Thinners and Easy Bruising. Not Present- Excessive bleeding, Gland problems, HIV and Persistent Infections.  Vitals (Chemira Jones CMA; 04/10/2016 11:13 AM) 04/10/2016 11:12 AM Weight: 118.8 lb Height: 62in Body Surface Area: 1.53 m Body Mass Index: 21.73 kg/m  Temp.: 97.21F(Oral)  Pulse: 58 (Regular)  BP: 116/78 (Sitting, Left Arm, Standard)      Physical Exam [name] MD; 05/02/4079 12:36 PM)  General Mental Status-Alert. General Appearance-Not in acute distress. Build & Nutrition-Well nourished. Posture-Normal posture. Gait-Normal.  Head and Neck Head-normocephalic, atraumatic with no lesions or palpable masses. Trachea-midline.  Chest and Lung Exam Chest and lung exam reveals -on auscultation, normal  breath sounds, no adventitious sounds and normal vocal resonance.  Cardiovascular Cardiovascular examination reveals -normal heart sounds, regular rate and rhythm with no murmurs and no digital clubbing, cyanosis, edema, increased warmth or tenderness.  Abdomen Inspection Inspection of the abdomen reveals - No Hernias. Palpation/Percussion Palpation and Percussion of the abdomen reveal -  Soft, Non Tender, No Rigidity (guarding), No hepatosplenomegaly and No Palpable abdominal masses.  Neurologic Neurologic evaluation reveals -alert and oriented x 3 with no impairment of recent or remote memory, normal attention span and ability to concentrate, normal sensation and normal coordination.  Musculoskeletal Normal Exam - Bilateral-Upper Extremity Strength Normal and Lower Extremity Strength Normal.    Assessment & Plan [name] MD; 8/54/6270 11:24 AM)  MASS OF COLON (K63.9) Impression: 66 year old male status post colonoscopy with rectosigmoid mass. This is concerning for colon cancer. Biopsy shows suspicion for invasion. CT scan of the abdomen shows no signs of metastatic disease. We will get a CT scan of his chest to complete his metastatic workup and we'll get a CEA prior to surgery in the preoperative testing. The surgery and anatomy were described to the patient as well as the risks of surgery and the possible complications. These include: Bleeding, deep abdominal infections and possible wound complications such as hernia and infection, damage to adjacent structures, leak of surgical connections, which can lead to other surgeries and possibly an ostomy, possible need for other procedures, such as abscess drains in radiology, possible prolonged hospital stay, possible diarrhea from removal of part of the colon, possible constipation from narcotics, possible bowel, bladder or sexual dysfunction if having rectal surgery, prolonged fatigue/weakness or appetite loss, possible early  recurrence of of disease, possible complications of their medical problems such as heart disease or arrhythmias or lung problems, death (less than 1%). I believe the patient understands and wishes to proceed with the surgery.

## 2016-04-12 ENCOUNTER — Other Ambulatory Visit: Payer: Self-pay | Admitting: General Surgery

## 2016-04-12 DIAGNOSIS — K6389 Other specified diseases of intestine: Secondary | ICD-10-CM

## 2016-04-18 ENCOUNTER — Other Ambulatory Visit: Payer: Medicare Other

## 2016-04-23 ENCOUNTER — Ambulatory Visit
Admission: RE | Admit: 2016-04-23 | Discharge: 2016-04-23 | Disposition: A | Discharge status: Home / Self Care | Payer: Medicare Other | Source: Ambulatory Visit | Attending: General Surgery | Admitting: General Surgery

## 2016-04-23 DIAGNOSIS — R918 Other nonspecific abnormal finding of lung field: Secondary | ICD-10-CM | POA: Diagnosis not present

## 2016-04-23 DIAGNOSIS — K6389 Other specified diseases of intestine: Secondary | ICD-10-CM

## 2016-04-23 DIAGNOSIS — C7800 Secondary malignant neoplasm of unspecified lung: Secondary | ICD-10-CM | POA: Diagnosis not present

## 2016-04-23 MED ORDER — IOPAMIDOL (ISOVUE-300) INJECTION 61%
75.0000 mL | Freq: Once | INTRAVENOUS | Status: AC | PRN
  Administered 2016-04-23: 75 mL via INTRAVENOUS

## 2016-05-16 NOTE — Patient Instructions (Signed)
Mario Mcdowell  05/16/2016   Your procedure is scheduled on: 05-21-16  Report to [facility] Main  Entrance Take [place]  elevators to 3rd floor to  [place] at [phone].   Call this number if you have problems the morning of surgery [phone]    Remember: ONLY 1 PERSON MAY GO WITH YOU TO SHORT STAY TO GET  READY MORNING OF [place].  Do not eat food After Midnight on Saturday 05-19-16. Drink plenty of clear liquids all day Sunday ad follow all of Dr [name]' instructions for bowel prep. Nothing by mouth after midnight on Sunday!!     Take these medicines the morning of surgery with A SIP OF WATER: none                                You may not have any metal on your body including hair pins and              piercings  Do not wear jewelry, make-up, lotions, powders or perfumes, deodorant                    Men may shave face and neck.   Do not bring valuables to the hospital. [place].  Contacts, dentures or bridgework may not be worn into surgery.  Leave suitcase in the car. After surgery it may be brought to your room.               Please read over the following fact sheets you were given: _____________________________________________________________________     CLEAR LIQUID DIET   Foods Allowed                                                                     Foods Excluded  Coffee and tea, regular and decaf                             liquids that you cannot  Plain Jell-O in any flavor                                             see through such as: Fruit ices (not with fruit pulp)                                     milk, soups, orange juice  Iced Popsicles                                    All solid food Carbonated beverages, regular and diet  Cranberry, grape and apple juices Sports drinks like Gatorade Lightly seasoned clear broth or consume(fat  free) Sugar, honey syrup  Sample Menu Breakfast                                Lunch                                     Supper Cranberry juice                    Beef broth                            Chicken broth Jell-O                                     Grape juice                           Apple juice Coffee or tea                        Jell-O                                      Popsicle                                                Coffee or tea                        Coffee or tea  _____________________________________________________________________  [facility] - Preparing for Surgery Before surgery, you can play an important role.  Because skin is not sterile, your skin needs to be as free of germs as possible.  You can reduce the number of germs on your skin by washing with CHG (chlorahexidine gluconate) soap before surgery.  CHG is an antiseptic cleaner which kills germs and bonds with the skin to continue killing germs even after washing. Please DO NOT use if you have an allergy to CHG or antibacterial soaps.  If your skin becomes reddened/irritated stop using the CHG and inform your nurse when you arrive at Short Stay. Do not shave (including legs and underarms) for at least 48 hours prior to the first CHG shower.  You may shave your face/neck. Please follow these instructions carefully:  1.  Shower with CHG Soap the night before surgery and the  morning of Surgery.  2.  If you choose to wash your hair, wash your hair first as usual with your  normal  shampoo.  3.  After you shampoo, rinse your hair and body thoroughly to remove the  shampoo.                           4.  Use CHG as you would any other liquid soap.  You can apply chg directly  to the skin and wash  Gently with a scrungie or clean washcloth.  5.  Apply the CHG Soap to your body ONLY FROM THE NECK DOWN.   Do not use on face/ open                           Wound or open sores. Avoid contact  with eyes, ears mouth and genitals (private parts).                       Wash face,  Genitals (private parts) with your normal soap.             6.  Wash thoroughly, paying special attention to the area where your surgery  will be performed.  7.  Thoroughly rinse your body with warm water from the neck down.  8.  DO NOT shower/wash with your normal soap after using and rinsing off  the CHG Soap.                9.  Pat yourself dry with a clean towel.            10.  Wear clean pajamas.            11.  Place clean sheets on your bed the night of your first shower and do not  sleep with pets. Day of Surgery : Do not apply any lotions/deodorants the morning of surgery.  Please wear clean clothes to the hospital/surgery center.  FAILURE TO FOLLOW THESE INSTRUCTIONS MAY RESULT IN THE CANCELLATION OF YOUR SURGERY PATIENT SIGNATURE_________________________________  NURSE SIGNATURE__________________________________  ________________________________________________________________________

## 2016-05-16 NOTE — Progress Notes (Signed)
EKG 01-06-16 epic CXR 04-23-16 epic

## 2016-05-17 ENCOUNTER — Encounter (HOSPITAL_COMMUNITY): Payer: Self-pay

## 2016-05-17 ENCOUNTER — Encounter (HOSPITAL_COMMUNITY)
Admission: RE | Admit: 2016-05-17 | Discharge: 2016-05-17 | Disposition: A | Discharge status: Home / Self Care | Payer: Medicare Other | Source: Ambulatory Visit | Attending: General Surgery | Admitting: General Surgery

## 2016-05-17 DIAGNOSIS — Z0183 Encounter for blood typing: Secondary | ICD-10-CM | POA: Insufficient documentation

## 2016-05-17 DIAGNOSIS — K639 Disease of intestine, unspecified: Secondary | ICD-10-CM | POA: Diagnosis not present

## 2016-05-17 DIAGNOSIS — Z01812 Encounter for preprocedural laboratory examination: Secondary | ICD-10-CM | POA: Insufficient documentation

## 2016-05-17 LAB — CBC
HEMATOCRIT: 44 % (ref 39.0–52.0)
HEMOGLOBIN: 15.3 g/dL (ref 13.0–17.0)
MCH: 32.3 pg (ref 26.0–34.0)
MCHC: 34.8 g/dL (ref 30.0–36.0)
MCV: 92.8 fL (ref 78.0–100.0)
Platelets: 186 10*3/uL (ref 150–400)
RBC: 4.74 MIL/uL (ref 4.22–5.81)
RDW: 14.3 % (ref 11.5–15.5)
WBC: 8.5 10*3/uL (ref 4.0–10.5)

## 2016-05-17 LAB — BASIC METABOLIC PANEL
ANION GAP: 9 (ref 5–15)
BUN: 16 mg/dL (ref 6–20)
CO2: 26 mmol/L (ref 22–32)
Calcium: 9.6 mg/dL (ref 8.9–10.3)
Chloride: 104 mmol/L (ref 101–111)
Creatinine, Ser: 0.98 mg/dL (ref 0.61–1.24)
GFR calc Af Amer: >60 mL/min (ref >60)
GFR calc non Af Amer: >60 mL/min (ref >60)
GLUCOSE: 61 mg/dL — AB (ref 65–99)
POTASSIUM: 4.2 mmol/L (ref 3.5–5.1)
Sodium: 139 mmol/L (ref 135–145)

## 2016-05-17 LAB — ABO/RH: ABO/RH(D): A POS

## 2016-05-18 LAB — CEA: CEA: 5.2 ng/mL — AB (ref 0.0–4.7)

## 2016-05-18 NOTE — Progress Notes (Signed)
CEA results routed to [facility] via epic

## 2016-05-20 MED ORDER — CLINDAMYCIN PHOSPHATE 900 MG/50ML IV SOLN
900.0000 mg | INTRAVENOUS | Status: AC
  Administered 2016-05-21: 900 mg via INTRAVENOUS
  Filled 2016-05-20: qty 50

## 2016-05-20 MED ORDER — GENTAMICIN SULFATE 40 MG/ML IJ SOLN
5.0000 mg/kg | INTRAVENOUS | Status: DC
  Filled 2016-05-20: qty 6.75

## 2016-05-20 MED ORDER — CLINDAMYCIN PHOSPHATE 900 MG/50ML IV SOLN: 900.0000 mg | INTRAVENOUS | Status: DC

## 2016-05-20 MED ORDER — GENTAMICIN SULFATE 40 MG/ML IJ SOLN
5.0000 mg/kg | INTRAMUSCULAR | Status: AC
  Administered 2016-05-21: 270 mg via INTRAVENOUS
  Filled 2016-05-20: qty 6.75

## 2016-05-21 ENCOUNTER — Inpatient Hospital Stay (HOSPITAL_COMMUNITY): Payer: Medicare Other | Admitting: Certified Registered Nurse Anesthetist

## 2016-05-21 ENCOUNTER — Encounter (HOSPITAL_COMMUNITY)
Admission: RE | Disposition: A | Discharge status: Home / Self Care | Payer: Self-pay | Source: Ambulatory Visit | Attending: General Surgery

## 2016-05-21 ENCOUNTER — Inpatient Hospital Stay (HOSPITAL_COMMUNITY)
Admission: RE | Admit: 2016-05-21 | Discharge: 2016-05-24 | DRG: 331 | Disposition: A | Discharge status: Home / Self Care | Payer: Medicare Other | Source: Ambulatory Visit | Attending: General Surgery | Admitting: General Surgery

## 2016-05-21 ENCOUNTER — Encounter (HOSPITAL_COMMUNITY): Payer: Self-pay | Admitting: *Deleted

## 2016-05-21 DIAGNOSIS — I739 Peripheral vascular disease, unspecified: Secondary | ICD-10-CM | POA: Diagnosis not present

## 2016-05-21 DIAGNOSIS — Z880 Allergy status to penicillin: Secondary | ICD-10-CM | POA: Diagnosis not present

## 2016-05-21 DIAGNOSIS — C189 Malignant neoplasm of colon, unspecified: Secondary | ICD-10-CM

## 2016-05-21 DIAGNOSIS — C772 Secondary and unspecified malignant neoplasm of intra-abdominal lymph nodes: Secondary | ICD-10-CM

## 2016-05-21 DIAGNOSIS — F17210 Nicotine dependence, cigarettes, uncomplicated: Secondary | ICD-10-CM | POA: Diagnosis not present

## 2016-05-21 DIAGNOSIS — N401 Enlarged prostate with lower urinary tract symptoms: Secondary | ICD-10-CM | POA: Diagnosis not present

## 2016-05-21 DIAGNOSIS — C187 Malignant neoplasm of sigmoid colon: Principal | ICD-10-CM | POA: Diagnosis present

## 2016-05-21 DIAGNOSIS — I708 Atherosclerosis of other arteries: Secondary | ICD-10-CM | POA: Diagnosis not present

## 2016-05-21 DIAGNOSIS — E785 Hyperlipidemia, unspecified: Secondary | ICD-10-CM | POA: Diagnosis not present

## 2016-05-21 DIAGNOSIS — N400 Enlarged prostate without lower urinary tract symptoms: Secondary | ICD-10-CM | POA: Diagnosis present

## 2016-05-21 DIAGNOSIS — K6389 Other specified diseases of intestine: Secondary | ICD-10-CM | POA: Diagnosis not present

## 2016-05-21 HISTORY — PX: LAPAROSCOPIC PARTIAL COLECTOMY: SHX5907

## 2016-05-21 HISTORY — DX: Malignant neoplasm of colon, unspecified: C18.9

## 2016-05-21 LAB — TYPE AND SCREEN
ABO/RH(D): A POS
Antibody Screen: NEGATIVE

## 2016-05-21 SURGERY — LAPAROSCOPIC PARTIAL COLECTOMY
Anesthesia: General | Site: Abdomen

## 2016-05-21 MED ORDER — ONDANSETRON HCL 4 MG/2ML IJ SOLN: 4.0000 mg | Freq: Once | INTRAMUSCULAR | Status: DC | PRN

## 2016-05-21 MED ORDER — ONDANSETRON HCL 4 MG/2ML IJ SOLN
INTRAMUSCULAR | Status: AC
  Filled 2016-05-21: qty 2

## 2016-05-21 MED ORDER — LACTATED RINGERS IR SOLN
Status: DC | PRN
  Administered 2016-05-21: 1000 mL

## 2016-05-21 MED ORDER — BUPIVACAINE LIPOSOME 1.3 % IJ SUSP
20.0000 mL | Freq: Once | INTRAMUSCULAR | Status: AC
  Administered 2016-05-21: 20 mL
  Filled 2016-05-21: qty 20

## 2016-05-21 MED ORDER — DIPHENHYDRAMINE HCL 50 MG/ML IJ SOLN: 12.5000 mg | Freq: Four times a day (QID) | INTRAMUSCULAR | Status: DC | PRN

## 2016-05-21 MED ORDER — ROCURONIUM BROMIDE 50 MG/5ML IV SOSY
PREFILLED_SYRINGE | INTRAVENOUS | Status: AC
  Filled 2016-05-21: qty 10

## 2016-05-21 MED ORDER — EPHEDRINE SULFATE 50 MG/ML IJ SOLN
INTRAMUSCULAR | Status: DC | PRN
  Administered 2016-05-21 (×3): 10 mg via INTRAVENOUS

## 2016-05-21 MED ORDER — DIPHENHYDRAMINE HCL 12.5 MG/5ML PO ELIX: 12.5000 mg | ORAL_SOLUTION | Freq: Four times a day (QID) | ORAL | Status: DC | PRN

## 2016-05-21 MED ORDER — ACETAMINOPHEN 500 MG PO TABS
1000.0000 mg | ORAL_TABLET | Freq: Four times a day (QID) | ORAL | Status: AC
  Administered 2016-05-21 – 2016-05-22 (×3): 1000 mg via ORAL
  Filled 2016-05-21 (×4): qty 2

## 2016-05-21 MED ORDER — ROCURONIUM BROMIDE 100 MG/10ML IV SOLN
INTRAVENOUS | Status: DC | PRN
  Administered 2016-05-21: 20 mg via INTRAVENOUS
  Administered 2016-05-21: 50 mg via INTRAVENOUS

## 2016-05-21 MED ORDER — LIDOCAINE HCL (CARDIAC) 20 MG/ML IV SOLN
INTRAVENOUS | Status: DC | PRN
  Administered 2016-05-21: 50 mg via INTRAVENOUS

## 2016-05-21 MED ORDER — ACETAMINOPHEN 500 MG PO TABS
1000.0000 mg | ORAL_TABLET | ORAL | Status: DC
  Filled 2016-05-21: qty 2

## 2016-05-21 MED ORDER — KETOROLAC TROMETHAMINE 30 MG/ML IJ SOLN
INTRAMUSCULAR | Status: DC | PRN
  Administered 2016-05-21: 30 mg via INTRAVENOUS

## 2016-05-21 MED ORDER — SUGAMMADEX SODIUM 200 MG/2ML IV SOLN
INTRAVENOUS | Status: AC
  Filled 2016-05-21: qty 4

## 2016-05-21 MED ORDER — KETAMINE HCL 10 MG/ML IJ SOLN
INTRAMUSCULAR | Status: AC
  Filled 2016-05-21: qty 1

## 2016-05-21 MED ORDER — FENTANYL CITRATE (PF) 250 MCG/5ML IJ SOLN
INTRAMUSCULAR | Status: AC
  Filled 2016-05-21: qty 5

## 2016-05-21 MED ORDER — BUPIVACAINE-EPINEPHRINE 0.25% -1:200000 IJ SOLN: INTRAMUSCULAR | Status: DC | PRN

## 2016-05-21 MED ORDER — EPHEDRINE 5 MG/ML INJ
INTRAVENOUS | Status: AC
  Filled 2016-05-21: qty 10

## 2016-05-21 MED ORDER — KETAMINE HCL 10 MG/ML IJ SOLN
INTRAMUSCULAR | Status: DC | PRN
  Administered 2016-05-21: 25 mg via INTRAVENOUS

## 2016-05-21 MED ORDER — LIDOCAINE 2% (20 MG/ML) 5 ML SYRINGE
INTRAMUSCULAR | Status: AC
  Filled 2016-05-21: qty 5

## 2016-05-21 MED ORDER — SODIUM CHLORIDE 0.9 % IJ SOLN
INTRAMUSCULAR | Status: AC
  Filled 2016-05-21: qty 50

## 2016-05-21 MED ORDER — ONDANSETRON HCL 4 MG/2ML IJ SOLN
INTRAMUSCULAR | Status: DC | PRN
  Administered 2016-05-21: 4 mg via INTRAVENOUS

## 2016-05-21 MED ORDER — KCL IN DEXTROSE-NACL 20-5-0.45 MEQ/L-%-% IV SOLN
INTRAVENOUS | Status: DC
  Administered 2016-05-21: 1000 mL via INTRAVENOUS
  Administered 2016-05-22: 22:00:00 via INTRAVENOUS
  Administered 2016-05-22: 1000 mL via INTRAVENOUS
  Filled 2016-05-21 (×3): qty 1000

## 2016-05-21 MED ORDER — ENOXAPARIN SODIUM 40 MG/0.4ML ~~LOC~~ SOLN
40.0000 mg | SUBCUTANEOUS | Status: DC
  Administered 2016-05-22 – 2016-05-23 (×2): 40 mg via SUBCUTANEOUS
  Filled 2016-05-21 (×3): qty 0.4

## 2016-05-21 MED ORDER — BUPIVACAINE HCL (PF) 0.25 % IJ SOLN
INTRAMUSCULAR | Status: AC
  Filled 2016-05-21: qty 60

## 2016-05-21 MED ORDER — FENTANYL CITRATE (PF) 250 MCG/5ML IJ SOLN
INTRAMUSCULAR | Status: DC | PRN
  Administered 2016-05-21: 150 ug via INTRAVENOUS
  Administered 2016-05-21 (×2): 50 ug via INTRAVENOUS

## 2016-05-21 MED ORDER — PROPOFOL 10 MG/ML IV BOLUS
INTRAVENOUS | Status: DC | PRN
  Administered 2016-05-21: 130 mg via INTRAVENOUS

## 2016-05-21 MED ORDER — DEXAMETHASONE SODIUM PHOSPHATE 10 MG/ML IJ SOLN
INTRAMUSCULAR | Status: DC | PRN
  Administered 2016-05-21: 6 mg via INTRAVENOUS

## 2016-05-21 MED ORDER — SUGAMMADEX SODIUM 200 MG/2ML IV SOLN
INTRAVENOUS | Status: DC | PRN
  Administered 2016-05-21: 300 mg via INTRAVENOUS

## 2016-05-21 MED ORDER — HEPARIN SODIUM (PORCINE) 5000 UNIT/ML IJ SOLN
5000.0000 [IU] | Freq: Once | INTRAMUSCULAR | Status: DC
  Filled 2016-05-21: qty 1

## 2016-05-21 MED ORDER — BUPIVACAINE HCL (PF) 0.25 % IJ SOLN
INTRAMUSCULAR | Status: AC
  Filled 2016-05-21: qty 30

## 2016-05-21 MED ORDER — PROPOFOL 10 MG/ML IV BOLUS
INTRAVENOUS | Status: AC
  Filled 2016-05-21: qty 20

## 2016-05-21 MED ORDER — DEXAMETHASONE SODIUM PHOSPHATE 10 MG/ML IJ SOLN
INTRAMUSCULAR | Status: AC
  Filled 2016-05-21: qty 1

## 2016-05-21 MED ORDER — ONDANSETRON HCL 4 MG/2ML IJ SOLN: 4.0000 mg | Freq: Four times a day (QID) | INTRAMUSCULAR | Status: DC | PRN

## 2016-05-21 MED ORDER — ONDANSETRON HCL 4 MG PO TABS: 4.0000 mg | ORAL_TABLET | Freq: Four times a day (QID) | ORAL | Status: DC | PRN

## 2016-05-21 MED ORDER — GABAPENTIN 300 MG PO CAPS
300.0000 mg | ORAL_CAPSULE | ORAL | Status: AC
  Administered 2016-05-21: 300 mg via ORAL
  Filled 2016-05-21: qty 1

## 2016-05-21 MED ORDER — LACTATED RINGERS IV SOLN
INTRAVENOUS | Status: DC
  Administered 2016-05-21: 08:00:00 via INTRAVENOUS

## 2016-05-21 MED ORDER — ALVIMOPAN 12 MG PO CAPS
12.0000 mg | ORAL_CAPSULE | Freq: Once | ORAL | Status: AC
  Administered 2016-05-21: 12 mg via ORAL
  Filled 2016-05-21: qty 1

## 2016-05-21 MED ORDER — BUPIVACAINE HCL (PF) 0.25 % IJ SOLN
INTRAMUSCULAR | Status: DC | PRN
  Administered 2016-05-21: 30 mL

## 2016-05-21 MED ORDER — MORPHINE SULFATE (PF) 4 MG/ML IV SOLN
2.0000 mg | INTRAVENOUS | Status: DC | PRN
  Administered 2016-05-22: 4 mg via INTRAVENOUS
  Filled 2016-05-21: qty 1

## 2016-05-21 MED ORDER — HYDROMORPHONE HCL 2 MG/ML IJ SOLN
INTRAMUSCULAR | Status: AC
  Filled 2016-05-21: qty 1

## 2016-05-21 MED ORDER — FENTANYL CITRATE (PF) 100 MCG/2ML IJ SOLN: 25.0000 ug | INTRAMUSCULAR | Status: DC | PRN

## 2016-05-21 MED ORDER — 0.9 % SODIUM CHLORIDE (POUR BTL) OPTIME
TOPICAL | Status: DC | PRN
  Administered 2016-05-21: 2000 mL

## 2016-05-21 MED ORDER — ALVIMOPAN 12 MG PO CAPS: 12.0000 mg | ORAL_CAPSULE | Freq: Once | ORAL | Status: DC

## 2016-05-21 MED ORDER — MIDAZOLAM HCL 5 MG/5ML IJ SOLN
INTRAMUSCULAR | Status: DC | PRN
  Administered 2016-05-21: 2 mg via INTRAVENOUS

## 2016-05-21 MED ORDER — ALVIMOPAN 12 MG PO CAPS
12.0000 mg | ORAL_CAPSULE | Freq: Two times a day (BID) | ORAL | Status: DC
  Administered 2016-05-22: 12 mg via ORAL
  Filled 2016-05-21 (×3): qty 1

## 2016-05-21 MED ORDER — HEPARIN SODIUM (PORCINE) 5000 UNIT/ML IJ SOLN
5000.0000 [IU] | Freq: Once | INTRAMUSCULAR | Status: AC
  Administered 2016-05-21: 5000 [IU] via SUBCUTANEOUS

## 2016-05-21 MED ORDER — MIDAZOLAM HCL 2 MG/2ML IJ SOLN
INTRAMUSCULAR | Status: AC
  Filled 2016-05-21: qty 2

## 2016-05-21 SURGICAL SUPPLY — 74 items
APPLIER CLIP 5 13 M/L LIGAMAX5 (MISCELLANEOUS)
BLADE EXTENDED COATED 6.5IN (ELECTRODE) IMPLANT
CABLE HIGH FREQUENCY MONO STRZ (ELECTRODE) ×2 IMPLANT
CELLS DAT CNTRL 66122 CELL SVR (MISCELLANEOUS) ×1 IMPLANT
CHLORAPREP W/TINT 26ML (MISCELLANEOUS) ×2 IMPLANT
CLIP APPLIE 5 13 M/L LIGAMAX5 (MISCELLANEOUS) IMPLANT
COUNTER NEEDLE 20 DBL MAG RED (NEEDLE) ×2 IMPLANT
COVER MAYO STAND STRL (DRAPES) ×6 IMPLANT
DECANTER SPIKE VIAL GLASS SM (MISCELLANEOUS) ×2 IMPLANT
DERMABOND ADVANCED (GAUZE/BANDAGES/DRESSINGS) ×1
DERMABOND ADVANCED .7 DNX12 (GAUZE/BANDAGES/DRESSINGS) ×1 IMPLANT
DRAIN CHANNEL 19F RND (DRAIN) IMPLANT
DRAPE LAPAROSCOPIC ABDOMINAL (DRAPES) ×2 IMPLANT
DRAPE SURG IRRIG POUCH 19X23 (DRAPES) ×2 IMPLANT
DRSG OPSITE POSTOP 4X10 (GAUZE/BANDAGES/DRESSINGS) IMPLANT
DRSG OPSITE POSTOP 4X6 (GAUZE/BANDAGES/DRESSINGS) ×2 IMPLANT
DRSG OPSITE POSTOP 4X8 (GAUZE/BANDAGES/DRESSINGS) IMPLANT
ELECT PENCIL ROCKER SW 15FT (MISCELLANEOUS) ×4 IMPLANT
ELECT REM PT RETURN 15FT ADLT (MISCELLANEOUS) ×2 IMPLANT
EVACUATOR SILICONE 100CC (DRAIN) IMPLANT
GAUZE SPONGE 4X4 12PLY STRL (GAUZE/BANDAGES/DRESSINGS) IMPLANT
GLOVE BIO SURGEON STRL SZ 6.5 (GLOVE) ×8 IMPLANT
GLOVE BIOGEL PI IND STRL 6.5 (GLOVE) ×4 IMPLANT
GLOVE BIOGEL PI IND STRL 7.0 (GLOVE) ×2 IMPLANT
GLOVE BIOGEL PI INDICATOR 6.5 (GLOVE) ×4
GLOVE BIOGEL PI INDICATOR 7.0 (GLOVE) ×2
GLOVE INDICATOR 6.5 STRL GRN (GLOVE) ×4 IMPLANT
GLOVE SURG SS PI 7.0 STRL IVOR (GLOVE) ×4 IMPLANT
GOWN STRL REUS W/TWL 2XL LVL3 (GOWN DISPOSABLE) ×8 IMPLANT
GOWN STRL REUS W/TWL XL LVL3 (GOWN DISPOSABLE) ×8 IMPLANT
GRASPER ENDOPATH ANVIL 10MM (MISCELLANEOUS) IMPLANT
HANDLE STAPLE EGIA 4 XL (STAPLE) ×2 IMPLANT
HOLDER FOLEY CATH W/STRAP (MISCELLANEOUS) ×2 IMPLANT
IRRIG SUCT STRYKERFLOW 2 WTIP (MISCELLANEOUS) ×2
IRRIGATION SUCT STRKRFLW 2 WTP (MISCELLANEOUS) ×1 IMPLANT
LEGGING LITHOTOMY PAIR STRL (DRAPES) IMPLANT
LUBRICANT JELLY K Y 4OZ (MISCELLANEOUS) ×2 IMPLANT
PACK COLON (CUSTOM PROCEDURE TRAY) ×2 IMPLANT
PAD POSITIONING PINK XL (MISCELLANEOUS) ×2 IMPLANT
PORT LAP GEL ALEXIS MED 5-9CM (MISCELLANEOUS) ×2 IMPLANT
POSITIONER SURGICAL ARM (MISCELLANEOUS) ×2 IMPLANT
RELOAD EGIA 60 MED/THCK PURPLE (STAPLE) ×4 IMPLANT
RTRCTR WOUND ALEXIS 18CM MED (MISCELLANEOUS) ×2
SCISSORS LAP 5X35 DISP (ENDOMECHANICALS) ×2 IMPLANT
SEALER TISSUE G2 STRG ARTC 35C (ENDOMECHANICALS) IMPLANT
SEALER TISSUE X1 CVD JAW (INSTRUMENTS) IMPLANT
SLEEVE XCEL OPT CAN 5 100 (ENDOMECHANICALS) ×2 IMPLANT
SPONGE DRAIN TRACH 4X4 STRL 2S (GAUZE/BANDAGES/DRESSINGS) IMPLANT
SPONGE LAP 18X18 X RAY DECT (DISPOSABLE) IMPLANT
STAPLER VISISTAT 35W (STAPLE) IMPLANT
SUT ETHILON 2 0 PS N (SUTURE) IMPLANT
SUT NOVA 0 T19/GS 22DT (SUTURE) ×4 IMPLANT
SUT NOVA NAB GS-21 0 18 T12 DT (SUTURE) ×4 IMPLANT
SUT PDS AB 1 CTX 36 (SUTURE) IMPLANT
SUT PDS AB 1 TP1 96 (SUTURE) IMPLANT
SUT PROLENE 2 0 KS (SUTURE) ×2 IMPLANT
SUT SILK 2 0 (SUTURE) ×1
SUT SILK 2 0 SH CR/8 (SUTURE) ×2 IMPLANT
SUT SILK 2-0 18XBRD TIE 12 (SUTURE) ×1 IMPLANT
SUT SILK 3 0 (SUTURE) ×1
SUT SILK 3 0 SH CR/8 (SUTURE) ×2 IMPLANT
SUT SILK 3-0 18XBRD TIE 12 (SUTURE) ×1 IMPLANT
SUT VIC AB 2-0 SH 18 (SUTURE) ×2 IMPLANT
SUT VIC AB 2-0 SH 27 (SUTURE) ×1
SUT VIC AB 2-0 SH 27X BRD (SUTURE) ×1 IMPLANT
SUT VIC AB 4-0 PS2 27 (SUTURE) ×2 IMPLANT
SYS LAPSCP GELPORT 120MM (MISCELLANEOUS)
SYSTEM LAPSCP GELPORT 120MM (MISCELLANEOUS) IMPLANT
TOWEL OR NON WOVEN STRL DISP B (DISPOSABLE) ×2 IMPLANT
TRAY FOLEY W/METER SILVER 16FR (SET/KITS/TRAYS/PACK) ×2 IMPLANT
TROCAR BLADELESS OPT 5 100 (ENDOMECHANICALS) ×4 IMPLANT
TROCAR XCEL BLUNT TIP 100MML (ENDOMECHANICALS) IMPLANT
TUBING CONNECTING 10 (TUBING) ×2 IMPLANT
TUBING INSUF HEATED (TUBING) ×2 IMPLANT

## 2016-05-21 NOTE — H&P (Signed)
The patient is a 66 year old male who presents with colorectal cancer. 66 year old male who presents to the office for evaluation of a rectosigmoid colon mass. 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 He denies any changes in bowel habits or rectal bleeding. Colonoscopy was performed for screening purposes. His weight has been stable. He is a long-time 1 pack per day smoker. He is otherwise healthy. He denies abdominal pain. he has never had any abdominal surgeries.   Past Surgical History Malachi Bonds, CMA; 04/10/2016 11:11 AM) Colon Polyp Removal - Colonoscopy  Diagnostic Studies History Malachi Bonds, CMA; 04/10/2016 11:11 AM) Colonoscopy within last year  Allergies Malachi Bonds, CMA; 04/10/2016 11:14 AM) Penicillin G Sodium *PENICILLINS*  Medication History (Chemira Jones, CMA; 04/10/2016 11:14 AM) Aspirin (81MG  Tablet, Oral) Active. Medications Reconciled  Social History Malachi Bonds, CMA; 04/10/2016 11:11 AM) Alcohol use Remotely quit alcohol use. Caffeine use Carbonated beverages, Coffee, Tea. No drug use Tobacco use Current every day smoker.  Family History Malachi Bonds, CMA; 04/10/2016 11:11 AM) Family history unknown First Degree Relatives  Other Problems Malachi Bonds, CMA; 04/10/2016 11:11 AM) Kidney Stone Melanoma     Review of Systems  General Not Present- Appetite Loss, Chills, Fatigue, Fever, Night Sweats, Weight Gain and Weight Loss. Skin Not Present- Change in Wart/Mole, Dryness, Hives, Jaundice, New Lesions, Non-Healing Wounds, Rash and Ulcer. HEENT Present- Wears glasses/contact lenses. Not Present- Earache, Hearing Loss, Hoarseness, Nose Bleed, Oral Ulcers, Ringing in the Ears, Seasonal Allergies, Sinus Pain, Sore Throat, Visual Disturbances and Yellow Eyes. Breast Not Present- Breast Mass, Breast Pain, Nipple Discharge and Skin Changes. Cardiovascular Present- Leg Cramps. Not Present- Chest Pain, Difficulty  Breathing Lying Down, Palpitations, Rapid Heart Rate, Shortness of Breath and Swelling of Extremities. Gastrointestinal Not Present- Abdominal Pain, Bloating, Bloody Stool, Change in Bowel Habits, Chronic diarrhea, Constipation, Difficulty Swallowing, Excessive gas, Gets full quickly at meals, Hemorrhoids, Indigestion, Nausea, Rectal Pain and Vomiting. Male Genitourinary Not Present- Blood in Urine, Change in Urinary Stream, Frequency, Impotence, Nocturia, Painful Urination, Urgency and Urine Leakage. Musculoskeletal Not Present- Back Pain, Joint Pain, Joint Stiffness, Muscle Pain, Muscle Weakness and Swelling of Extremities. Neurological Not Present- Decreased Memory, Fainting, Headaches, Numbness, Seizures, Tingling, Tremor, Trouble walking and Weakness. Psychiatric Not Present- Anxiety, Bipolar, Change in Sleep Pattern, Depression, Fearful and Frequent crying. Endocrine Not Present- Cold Intolerance, Excessive Hunger, Hair Changes, Heat Intolerance, Hot flashes and New Diabetes. Hematology Present- Blood Thinners and Easy Bruising. Not Present- Excessive bleeding, Gland problems, HIV and Persistent Infections.  BP 134/72   Pulse 64   Temp 97.7 F (36.5 C) (Oral)   Resp 18   Ht 5\' 5"  (1.651 m)   Wt 53.2 kg (117 lb 3 oz)   SpO2 100%   BMI 19.50 kg/m    Physical Exam  General Mental Status-Alert. General Appearance-Not in acute distress. Build & Nutrition-Well nourished. Posture-Normal posture. Gait-Normal.  Head and Neck Head-normocephalic, atraumatic with no lesions or palpable masses. Trachea-midline.  Chest and Lung Exam Chest and lung exam reveals -on auscultation, normal breath sounds, no adventitious sounds and normal vocal resonance.  Cardiovascular Cardiovascular examination reveals -normal heart sounds, regular rate and rhythm with no murmurs and no digital clubbing, cyanosis, edema, increased warmth or  tenderness.  Abdomen Inspection Inspection of the abdomen reveals - No Hernias. Palpation/Percussion Palpation and Percussion of the abdomen reveal - Soft, Non Tender, No Rigidity (guarding), No hepatosplenomegaly and No Palpable abdominal masses.  Neurologic Neurologic evaluation reveals -alert and oriented x 3 with no  impairment of recent or remote memory, normal attention span and ability to concentrate, normal sensation and normal coordination.  Musculoskeletal Normal Exam - Bilateral-Upper Extremity Strength Normal and Lower Extremity Strength Normal.    Assessment & Plan   MASS OF COLON (K63.9) Impression: 66 year old male status post colonoscopy with rectosigmoid mass. This is concerning for colon cancer. Biopsy shows suspicion for invasion. CT scan of the abdomen shows no signs of metastatic disease. CT scan of his chest shows no signs of metastatic disease. CEA prior to surgery was slightly elevated. The surgery and anatomy were described to the patient as well as the risks of surgery and the possible complications. These include: Bleeding, deep abdominal infections and possible wound complications such as hernia and infection, damage to adjacent structures, leak of surgical connections, which can lead to other surgeries and possibly an ostomy, possible need for other procedures, such as abscess drains in radiology, possible prolonged hospital stay, possible diarrhea from removal of part of the colon, possible constipation from narcotics, possible bowel, bladder or sexual dysfunction if having rectal surgery, prolonged fatigue/weakness or appetite loss, possible early recurrence of of disease, possible complications of their medical problems such as heart disease or arrhythmias or lung problems, death (less than 1%). I believe the patient understands and wishes to proceed with the surgery.

## 2016-05-21 NOTE — Transfer of Care (Signed)
Immediate Anesthesia Transfer of Care Note  Patient: Mario Mcdowell  Procedure(s) Performed: Procedure(s): LAPAROSCOPIC PARTIAL COLECTOMY, SPLENIC FLEXURE MOBILIZATION AND PROCTOSCOPY (N/A)  Patient Location: PACU  Anesthesia Type:General  Level of Consciousness: oriented and drowsy  Airway & Oxygen Therapy: Patient Spontanous Breathing and Patient connected to nasal cannula oxygen  Post-op Assessment: Report given to RN and Post -op Vital signs reviewed and stable  Post vital signs: Reviewed and stable  Last Vitals:  Vitals:   05/21/16 0725  BP: 134/72  Pulse: 64  Resp: 18  Temp: 36.5 C    Last Pain:  Vitals:   05/21/16 0725  TempSrc: Oral         Complications: No apparent anesthesia complications

## 2016-05-21 NOTE — Anesthesia Preprocedure Evaluation (Addendum)
Anesthesia Evaluation  Patient identified by MRN, date of birth, ID band Patient awake    Reviewed: Allergy & Precautions, NPO status , Patient's Chart, lab work & pertinent test results  Airway Mallampati: II  TM Distance: >3 FB Neck ROM: Full    Dental  (+) Dental Advisory Given, Lower Dentures, Upper Dentures   Pulmonary Current Smoker,    Pulmonary exam normal breath sounds clear to auscultation       Cardiovascular + Peripheral Vascular Disease (Aorto-iliac atherosclerosis )  negative cardio ROS Normal cardiovascular exam Rhythm:Regular Rate:Normal     Neuro/Psych negative neurological ROS     GI/Hepatic Neg liver ROS, Colon mass   Endo/Other  negative endocrine ROS  Renal/GU negative Renal ROS     Musculoskeletal negative musculoskeletal ROS (+)   Abdominal   Peds  Hematology negative hematology ROS (+)   Anesthesia Other Findings Day of surgery medications reviewed with the patient.  Reproductive/Obstetrics                            Anesthesia Physical Anesthesia Plan  ASA: II  Anesthesia Plan: General   Post-op Pain Management:    Induction: Intravenous  Airway Management Planned: Oral ETT  Additional Equipment:   Intra-op Plan:   Post-operative Plan: Extubation in OR  Informed Consent: I have reviewed the patients History and Physical, chart, labs and discussed the procedure including the risks, benefits and alternatives for the proposed anesthesia with the patient or authorized representative who has indicated his/her understanding and acceptance.   Dental advisory given  Plan Discussed with: CRNA  Anesthesia Plan Comments: (ERAS protocol)        Anesthesia Quick Evaluation

## 2016-05-21 NOTE — Anesthesia Procedure Notes (Signed)
Procedure Name: Intubation Date/Time: 05/21/2016 8:20 AM Performed by: Chandra Batch A Pre-anesthesia Checklist: Patient identified, Emergency Drugs available, Suction available, Patient being monitored and Timeout performed Patient Re-evaluated:Patient Re-evaluated prior to inductionOxygen Delivery Method: Circle system utilized Preoxygenation: Pre-oxygenation with 100% oxygen Intubation Type: IV induction Ventilation: Mask ventilation without difficulty Laryngoscope Size: Mac and 4 Grade View: Grade I Tube type: Oral Tube size: 7.5 mm Number of attempts: 1 Airway Equipment and Method: Stylet Placement Confirmation: ETT inserted through vocal cords under direct vision,  positive ETCO2,  CO2 detector and breath sounds checked- equal and bilateral Secured at: 21 cm Tube secured with: Tape Dental Injury: Teeth and Oropharynx as per pre-operative assessment

## 2016-05-21 NOTE — Op Note (Signed)
05/21/2016  10:40 AM  PATIENT:  Mario Mcdowell  66 y.o. male  Patient Care Team: Ria Bush, MD as PCP - General (Family Medicine)  PRE-OPERATIVE DIAGNOSIS:  colon mass  POST-OPERATIVE DIAGNOSIS:  colon mass  PROCEDURE:  Procedure(s): LAPAROSCOPIC PARTIAL COLECTOMY, SPLENIC FLEXURE MOBILIZATION AND PROCTOSCOPY    Surgeon(s): [name], MD Stark Klein, MD  ASSISTANT: Dr Barry Dienes   ANESTHESIA:   local and general  EBL: 79ml  Total I/O In: -  Out: 75 [Urine:50; Blood:25]  Delay start of Pharmacological VTE agent (>24hrs) due to surgical blood loss or risk of bleeding:  no  DRAINS: none   SPECIMEN:  Source of Specimen:  Sigmoid colon  DISPOSITION OF SPECIMEN:  PATHOLOGY  COUNTS:  YES  PLAN OF CARE: Admit to inpatient   PATIENT DISPOSITION:  PACU - hemodynamically stable.  INDICATION:     66yo M who was found to have a rectosigmoid mass during colonoscopy. I recommended segmental resection:  The anatomy & physiology of the digestive tract was discussed.  The pathophysiology was discussed.  Natural history risks without surgery was discussed.   I worked to give an overview of the disease and the frequent need to have multispecialty involvement.  I feel the risks of no intervention will lead to serious problems that outweigh the operative risks; therefore, I recommended a partial colectomy to remove the pathology.  Laparoscopic & open techniques were discussed.   Risks such as bleeding, infection, abscess, leak, reoperation, possible ostomy, hernia, heart attack, death, and other risks were discussed.  I noted a good likelihood this will help address the problem.   Goals of post-operative recovery were discussed as well.    The patient expressed understanding & wished to proceed with surgery.  OR FINDINGS:   Patient had a rectosigmoid mass.  No obvious metastatic disease on visceral parietal peritoneum or liver.  The anastomosis rests 10 cm from the anal verge  by rigid proctoscopy.  DESCRIPTION:   Informed consent was confirmed.  The patient underwent general anaesthesia without difficulty.  The patient was positioned appropriately.  VTE prevention in place.  The patient's abdomen was clipped, prepped, & draped in a sterile fashion.  Surgical timeout confirmed our plan.  The patient was positioned in Trendelenburg.  Abdominal entry was gained using a Pfannenstiel incision.  Entry was clean.  I placed an [place] wound protector and cap.  I induced carbon dioxide insufflation.  Camera inspection revealed no injury.  Extra ports were carefully placed under direct laparoscopic visualization.   I reflected the greater omentum and the upper abdomen the small bowel in the upper abdomen. I scored the base of peritoneum of the right side of the mesentery of the left colon from the ligament of Treitz to the peritoneal reflection of the mid rectum.  The patient had tattoo located at the rectosigmoid junction.  I elevated the sigmoid mesentery and enetered into the retro-mesenteric plane. We were able to identify the left ureter and gonadal vessels. We kept those posterior within the retroperitoneum and elevated the left colon mesentery off that. I did isolated IMA pedicle but did not ligate it yet.  I continued distally and got into the avascular plane posterior to the mesorectum. This allowed me to help mobilize the rectum as well by freeing the mesorectum off the sacrum.  I mobilized the peritoneal coverings towards the peritoneal reflection on both the right and left sides of the rectum.  I could see the right and left ureters and stayed  away from them.    I skeletonized the inferior mesenteric artery pedicle.  I went down to its takeoff from the aorta.   I isolated the inferior mesenteric vein off of the ligament of Treitz just cephalad to that as well.  After confirming the left ureter was out of the way, I went ahead and ligated the inferior mesenteric artery pedicle  with bipolar EnSeal ~2cm above its takeoff from the aorta.   I did ligate the inferior mesenteric vein in a similar fashion.  We ensured hemostasis. I skeletonized the mesorectum at the junction at the proximal rectum using blunt dissection & bipolar EnSeal.  I mobilized the left colon in a lateral to medial fashion off the line of [place] up towards the splenic flexure to ensure good mobilization of the left colon to reach into the pelvis.    I then freed the mesentery distal to the distal tattoo using the bipolar Enseal device. Once the rectum was completely skeletonized, I divided this with 2 bowel load 60 mm staplers. The abdomen was then desufflated and the colon was removed from the abdomen. The mass was palpated approximately 4 cm from the distal border. I identified a pulsatile vessel in the mesentery and took the sigmoid colon mesentery down to this spot using the bipolar Enseal device. The colon was then transected proximally over a pursestring device. A 2-0 Prolene pursestring was placed. A 29 mm EEA anvil was placed into the colon and the pursestring was tied tightly around this. The specimen was sent to pathology. The abdomen was then reinsufflated.  After taking this additional colon, I could not mobilize this down to the pelvis and therefore I took down the splenic flexure using the bipolar Enseal device. This allowed for the colon to easily mobilized into the pelvis. An anastomosis was created using the EEA stapler. There was no tension. There was no leak when tested underwater with insufflation.  The abdomen was irrigated. Hemostasis was good. The omentum was then brought down over the abdominal contents and the abdomen was desufflated. The wound protector was removed. We then switched to clean gowns, gloves, drapes and instruments.  The peritoneum was then closed using a running 2-0 Vicryl suture. The fascia was closed using interrupted 0 Novafil sutures. The subcutaneous tissue was  reapproximated using interrupted 2-0 Vicryl suture. The skin was closed using a running 4-0 Vicryl subcuticular suture. A sterile dressing was applied to the Pfannenstiel incision site. The remaining port sites were closed using a 4-0 Vicryl for the skin. Dermabond was placed over this. The patient tolerated the procedure well and was awakened from anesthesia and sent to the postanesthesia care unit in stable condition. All counts were correct per operating room staff.

## 2016-05-22 ENCOUNTER — Encounter: Payer: Self-pay | Admitting: Family Medicine

## 2016-05-22 LAB — CBC
HCT: 37.4 % — ABNORMAL LOW (ref 39.0–52.0)
Hemoglobin: 12.8 g/dL — ABNORMAL LOW (ref 13.0–17.0)
MCH: 31.4 pg (ref 26.0–34.0)
MCHC: 34.2 g/dL (ref 30.0–36.0)
MCV: 91.9 fL (ref 78.0–100.0)
PLATELETS: 151 10*3/uL (ref 150–400)
RBC: 4.07 MIL/uL — ABNORMAL LOW (ref 4.22–5.81)
RDW: 14.6 % (ref 11.5–15.5)
WBC: 14.3 10*3/uL — ABNORMAL HIGH (ref 4.0–10.5)

## 2016-05-22 LAB — BASIC METABOLIC PANEL
Anion gap: 6 (ref 5–15)
BUN: 15 mg/dL (ref 6–20)
CALCIUM: 8.3 mg/dL — AB (ref 8.9–10.3)
CO2: 24 mmol/L (ref 22–32)
CREATININE: 0.86 mg/dL (ref 0.61–1.24)
Chloride: 107 mmol/L (ref 101–111)
Glucose, Bld: 108 mg/dL — ABNORMAL HIGH (ref 65–99)
Potassium: 4.1 mmol/L (ref 3.5–5.1)
SODIUM: 137 mmol/L (ref 135–145)

## 2016-05-22 NOTE — Anesthesia Postprocedure Evaluation (Signed)
Anesthesia Post Note  Patient: Mario Mcdowell  Procedure(s) Performed: Procedure(s) (LRB): LAPAROSCOPIC PARTIAL COLECTOMY, SPLENIC FLEXURE MOBILIZATION AND PROCTOSCOPY (N/A)  Patient location during evaluation: PACU Anesthesia Type: General Level of consciousness: awake and alert Pain management: pain level controlled Vital Signs Assessment: post-procedure vital signs reviewed and stable Respiratory status: spontaneous breathing, nonlabored ventilation, respiratory function stable and patient connected to nasal cannula oxygen Cardiovascular status: blood pressure returned to baseline and stable Postop Assessment: no signs of nausea or vomiting Anesthetic complications: no       Last Vitals:  Vitals:   05/22/16 1044 05/22/16 1405  BP: (!) 101/55 109/60  Pulse: 61 66  Resp: 16 16  Temp: 37 C 36.9 C    Last Pain:  Vitals:   05/22/16 1405  TempSrc: Oral  PainSc:                  [name]

## 2016-05-22 NOTE — Progress Notes (Signed)
1 Day Post-Op lap sigmoidectomy  Subjective: Doing well.  Pain controlled.  No nausea.  Passing flatus   Objective: Vital signs in last 24 hours: Temp:  [97.3 F (36.3 C)-99.1 F (37.3 C)] 98 F (36.7 C) (05/08 0544) Pulse Rate:  [57-72] 58 (05/08 0544) Resp:  [12-61] 61 (05/08 0544) BP: (79-139)/(49-81) 94/50 (05/08 0544) SpO2:  [95 %-100 %] 96 % (05/08 0544)   Intake/Output from previous day: 05/07 0701 - 05/08 0700 In: 1266.3 [P.O.:110; I.V.:1156.3] Out: 920 [Urine:895; Blood:25] Intake/Output this shift: No intake/output data recorded.   General appearance: alert and cooperative GI: normal findings: soft, non-tender  Incision: no significant drainage  Lab Results:   Recent Labs  05/22/16 0536  WBC 14.3*  HGB 12.8*  HCT 37.4*  PLT 151   BMET  Recent Labs  05/22/16 0536  NA 137  K 4.1  CL 107  CO2 24  GLUCOSE 108*  BUN 15  CREATININE 0.86  CALCIUM 8.3*   PT/INR No results for input(s): LABPROT, INR in the last 72 hours. ABG No results for input(s): PHART, HCO3 in the last 72 hours.  Invalid input(s): PCO2, PO2  MEDS, Scheduled . acetaminophen  1,000 mg Oral Q6H  . alvimopan  12 mg Oral BID  . enoxaparin (LOVENOX) injection  40 mg Subcutaneous Q24H    Studies/Results: No results found.  Assessment: s/p Procedure(s): LAPAROSCOPIC PARTIAL COLECTOMY, SPLENIC FLEXURE MOBILIZATION AND PROCTOSCOPY Patient Active Problem List   Diagnosis Date Noted  . Colon cancer ([place]) 05/21/2016  . Welcome to Medicare preventive visit 01/06/2016  . Advanced care planning/counseling discussion 01/06/2016  . HLD (hyperlipidemia) 01/06/2016  . Corns and callus 01/06/2016  . Paresthesia of right foot 10/25/2015  . BPH without obstruction/lower urinary tract symptoms 10/25/2015  . Aorto-iliac atherosclerosis ([place])   . Smoker     Expected post op course  Plan: Advance diet to clears and fulls if tolerated Ambulate Decrease MIV Foley out in AM   LOS: 1  day     .Rosario Adie, [facility], [place]   05/22/2016 8:13 AM

## 2016-05-23 LAB — CBC
HCT: 39.2 % (ref 39.0–52.0)
Hemoglobin: 13.5 g/dL (ref 13.0–17.0)
MCH: 31.9 pg (ref 26.0–34.0)
MCHC: 34.4 g/dL (ref 30.0–36.0)
MCV: 92.7 fL (ref 78.0–100.0)
PLATELETS: 159 10*3/uL (ref 150–400)
RBC: 4.23 MIL/uL (ref 4.22–5.81)
RDW: 14.9 % (ref 11.5–15.5)
WBC: 10.6 10*3/uL — ABNORMAL HIGH (ref 4.0–10.5)

## 2016-05-23 LAB — BASIC METABOLIC PANEL
ANION GAP: 5 (ref 5–15)
BUN: 11 mg/dL (ref 6–20)
CALCIUM: 8.7 mg/dL — AB (ref 8.9–10.3)
CHLORIDE: 107 mmol/L (ref 101–111)
CO2: 26 mmol/L (ref 22–32)
CREATININE: 0.9 mg/dL (ref 0.61–1.24)
GFR calc non Af Amer: >60 mL/min (ref >60)
GLUCOSE: 101 mg/dL — AB (ref 65–99)
Potassium: 4.4 mmol/L (ref 3.5–5.1)
Sodium: 138 mmol/L (ref 135–145)

## 2016-05-23 MED ORDER — SODIUM CHLORIDE 0.9% FLUSH: 3.0000 mL | INTRAVENOUS | Status: DC | PRN

## 2016-05-23 MED ORDER — HYDROCODONE-ACETAMINOPHEN 5-325 MG PO TABS
1.0000 | ORAL_TABLET | Freq: Four times a day (QID) | ORAL | Status: DC | PRN
  Administered 2016-05-23 (×2): 1 via ORAL
  Filled 2016-05-23 (×3): qty 1

## 2016-05-23 MED ORDER — SODIUM CHLORIDE 0.9% FLUSH
3.0000 mL | Freq: Two times a day (BID) | INTRAVENOUS | Status: DC
  Administered 2016-05-23 (×2): 3 mL via INTRAVENOUS

## 2016-05-23 MED ORDER — ACETAMINOPHEN 500 MG PO TABS: 1000.0000 mg | ORAL_TABLET | Freq: Four times a day (QID) | ORAL | Status: DC | PRN

## 2016-05-23 NOTE — Discharge Instructions (Signed)

## 2016-05-23 NOTE — Progress Notes (Signed)
2 Days Post-Op lap sigmoidectomy  Subjective: Doing well.  Pain controlled.  No nausea.  Having BM's.  Foley out, urinating well  Objective: Vital signs in last 24 hours: Temp:  [98.2 F (36.8 C)-98.6 F (37 C)] 98.3 F (36.8 C) (05/09 0505) Pulse Rate:  [61-66] 66 (05/09 0505) Resp:  [16-17] 17 (05/09 0505) BP: (98-113)/(55-66) 108/61 (05/09 0505) SpO2:  [96 %-99 %] 96 % (05/09 0505)   Intake/Output from previous day: 05/08 0701 - 05/09 0700 In: 1645 [P.O.:390; I.V.:1255] Out: 2450 [Urine:2450] Intake/Output this shift: No intake/output data recorded.   General appearance: alert and cooperative GI: normal findings: soft, non-tender  Incision: no significant drainage  Lab Results:   Recent Labs  05/22/16 0536 05/23/16 0444  WBC 14.3* 10.6*  HGB 12.8* 13.5  HCT 37.4* 39.2  PLT 151 159   BMET  Recent Labs  05/22/16 0536 05/23/16 0444  NA 137 138  K 4.1 4.4  CL 107 107  CO2 24 26  GLUCOSE 108* 101*  BUN 15 11  CREATININE 0.86 0.90  CALCIUM 8.3* 8.7*   PT/INR No results for input(s): LABPROT, INR in the last 72 hours. ABG No results for input(s): PHART, HCO3 in the last 72 hours.  Invalid input(s): PCO2, PO2  MEDS, Scheduled . enoxaparin (LOVENOX) injection  40 mg Subcutaneous Q24H  . sodium chloride flush  3 mL Intravenous Q12H    Studies/Results: No results found.  Assessment: s/p Procedure(s): LAPAROSCOPIC PARTIAL COLECTOMY, SPLENIC FLEXURE MOBILIZATION AND PROCTOSCOPY Patient Active Problem List   Diagnosis Date Noted  . Colon cancer ([place]) 05/21/2016  . Welcome to Medicare preventive visit 01/06/2016  . Advanced care planning/counseling discussion 01/06/2016  . HLD (hyperlipidemia) 01/06/2016  . Corns and callus 01/06/2016  . Paresthesia of right foot 10/25/2015  . BPH without obstruction/lower urinary tract symptoms 10/25/2015  . Aorto-iliac atherosclerosis ([place])   . Smoker     Expected post op course  Plan: Advance diet to  soft foods Ambulate SL MIV Anticipate d/c in AM   LOS: 2 days     .Rosario Adie, [facility], [place]   05/23/2016 8:29 AM

## 2016-05-24 LAB — CBC
HCT: 42.9 % (ref 39.0–52.0)
Hemoglobin: 14.8 g/dL (ref 13.0–17.0)
MCH: 31.8 pg (ref 26.0–34.0)
MCHC: 34.5 g/dL (ref 30.0–36.0)
MCV: 92.1 fL (ref 78.0–100.0)
PLATELETS: 164 10*3/uL (ref 150–400)
RBC: 4.66 MIL/uL (ref 4.22–5.81)
RDW: 14.4 % (ref 11.5–15.5)
WBC: 11.1 10*3/uL — ABNORMAL HIGH (ref 4.0–10.5)

## 2016-05-24 LAB — BASIC METABOLIC PANEL
Anion gap: 8 (ref 5–15)
BUN: 14 mg/dL (ref 6–20)
CALCIUM: 9 mg/dL (ref 8.9–10.3)
CHLORIDE: 102 mmol/L (ref 101–111)
CO2: 26 mmol/L (ref 22–32)
CREATININE: 0.87 mg/dL (ref 0.61–1.24)
GFR calc Af Amer: >60 mL/min (ref >60)
GFR calc non Af Amer: >60 mL/min (ref >60)
GLUCOSE: 95 mg/dL (ref 65–99)
POTASSIUM: 4.1 mmol/L (ref 3.5–5.1)
Sodium: 136 mmol/L (ref 135–145)

## 2016-05-24 MED ORDER — HYDROCODONE-ACETAMINOPHEN 5-325 MG PO TABS: 1.0000 | ORAL_TABLET | Freq: Four times a day (QID) | ORAL | 0 refills | Status: DC | PRN

## 2016-05-24 NOTE — Progress Notes (Signed)
Pt refused Lovenox shot this am. He said he understood what it was for but did not want it. Pt was given d/c instructions and prescriptions and understanding was verbalized.

## 2016-05-24 NOTE — Discharge Summary (Signed)
Physician Discharge Summary  Patient ID: Mario Mcdowell MRN: [id number] DOB/AGE: 66-Feb-1952 66 y.o.  Admit date: 05/21/2016 Discharge date: 05/24/2016  Admission Diagnoses: colon mass  Discharge Diagnoses:  Active Problems:   Colon cancer ([place]) stage 3  Discharged Condition: good  Hospital Course: Patient admitted after surgery.  Diet was advanced.  He was discharged to home once tolerating a diet and PO narcotics.  He was urinating and ambulating without difficulty.  Consults: None  Significant Diagnostic Studies: labs: cbc, chemistry  Treatments: IV hydration, analgesia: Vicodin and surgery: lap sigmoidectomy  Discharge Exam: Blood pressure 120/76, pulse 65, temperature 98.2 F (36.8 C), temperature source Oral, resp. rate 16, height 5\' 5"  (1.651 m), weight 53.2 kg (117 lb 3 oz), SpO2 98 %. General appearance: alert and cooperative GI: normal findings: soft, non-tender Incision/Wound: clean, dry, intact  Disposition: 01-Home or Self Care   Allergies as of 05/24/2016      Reactions   Penicillins Swelling   Has patient had a PCN reaction causing immediate rash, facial/tongue/throat swelling, SOB or lightheadedness with hypotension: Yes Has patient had a PCN reaction causing severe rash involving mucus membranes or skin necrosis: No Has patient had a PCN reaction that required hospitalization No Has patient had a PCN reaction occurring within the last 10 years: No If all of the above answers are "NO", then may proceed with Cephalosporin use.      Medication List    TAKE these medications   aspirin EC 81 MG tablet Take 1 tablet (81 mg total) by mouth daily.   HYDROcodone-acetaminophen 5-325 MG tablet Commonly known as:  NORCO/VICODIN Take 1-2 tablets by mouth every 6 (six) hours as needed for moderate pain or severe pain.      Follow-up Information    [name], MD. Schedule an appointment as soon as possible for a visit in 2 week(s).   Specialty:  General  Surgery Contact information: [address] [place] [address] [phone]           Signed: Rosario Adie 10/01/9148, 5:69 AM

## 2016-05-26 ENCOUNTER — Encounter: Payer: Self-pay | Admitting: Family Medicine

## 2016-05-29 ENCOUNTER — Telehealth: Payer: Self-pay | Admitting: Hematology

## 2016-05-29 ENCOUNTER — Encounter: Payer: Self-pay | Admitting: Hematology

## 2016-05-29 NOTE — Telephone Encounter (Signed)
Pt's wife cld to confirm appt on 5/22 at 11am w/Dr. Burr Medico. Letter mailed.

## 2016-06-04 NOTE — Progress Notes (Signed)
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 Resected and retrieved. - Rule out malignancy, tumor in the sigmoid colon. Biopsied. Tattooed. - Internal hemorrhoids. - The examination was otherwise normal on direct and retroflexion views.       03/14/2016 Imaging    CT A/P W CONTRAST  IMPRESSION: Short segment area of wall thickening in the distal sigmoid colon, suspicious for site of primary colon carcinoma. Recommend correlation with colonoscopy results.      04/23/2016 Imaging    CT Chest W Contrast IMPRESSION: 1. No evidence of pulmonary metastasis. 2. Bilateral calcified pulmonary nodules and mediastinal lymph nodes consistent with granulomatous disease. 3. Centrilobular emphysema. 4. Coronary artery calcification and aortic atherosclerotic calcification.      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 Colon, segmental resection for tumor, sigmoid - INVASIVE ADENOCARCINOMA, WELL TO MODERATELY DIFFERENTIATED, WITH  ABUNDANT MUCIN (PT3 PN1) - METASTATIC ADENOCARCINOMA IN ONE OF TWENTY-TWO LYMPH NODES (1/22) - TWO TUMOR DEPOSITS PRESENT - MARGINS UNINVOLVED BY CARCINOMA - HYPERPLASTIC POLYPS (X2) - SEE ONCOLOGY TABLE BELOW 2. Colon, resection margin (donut), final distal margin - BENIGN COLON - NO MALIGNANCY IDENTIFIED      05/21/2016 Surgery    LAPAROSCOPIC PARTIAL COLECTOMY, SPLENIC FLEXURE MOBILIZATION AND PROCTOSCOPY       HISTORY OF PRESENTING ILLNESS:  Mario Mcdowell 66 y.o. male is here because of recently diagnosed colorectalcancer. He has reported to Dr. [name] on 01/21/08 for a evaluation of a rectosigmoid colon mass. 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 He denied any change in his bowel movement or habits of rectal bleeding.   He had a screening colonoscopy by Dr. Fuller Plan 03/12/2016. Who referred him to [name]. He denies any symptoms before. Before surgery, no changes in weight loss or change in energy level  Since the surgery, his appetite has been good and his energy level is fine.No pain reported from the incision. He takes hydrocodone for his pain.  August 08, 2015 he was admitted to the hospital for right lower quadrant abdominal pain due to a kidney stone.  He presents to the clinic today with his wife for consult of his stage 3 colon cancer and chemotherapy. The patient's wife says he doesn't go to the doctor often. He is complaint and wants to do whatever he can to get treated. He is worried about what he can eat when he can start chemo.   MEDICAL HISTORY:  Past Medical History:  Diagnosis Date  . Aorto-iliac atherosclerosis ([place])    by CT; in bilateral legs   .  BPH without obstruction/lower urinary tract symptoms 10/25/2015  . Colon adenocarcinoma ([place]) 05/21/2016   Invasive, s/p partial colectomy [name]) 05/2016 with 1/22 positive LNs  . Numbness and tingling of right lower extremity   . Right kidney stone 07/2015   with mild R hydronephrosis s/p  spontanous passage  . Smoker   . Squamous cell cancer of lip     SURGICAL HISTORY: Past Surgical History:  Procedure Laterality Date  . COLONOSCOPY  02/2016   4 polyps, sigmoid tumor biopsied (TVA w/ MICROSCOPIC FOCUS SUSPICIOUS FOR INVASION) pending surgical resection, rpt 1 yr Fuller Plan)  . CYST REMOVAL NECK  2015  . LAPAROSCOPIC PARTIAL COLECTOMY N/A 05/21/2016   colon cancer, 1/20 positive lymp nodes. LAPAROSCOPIC PARTIAL COLECTOMY, SPLENIC FLEXURE MOBILIZATION AND PROCTOSCOPY;  [name], MD)  . SKIN CANCER EXCISION  greater than 15 years ago   lip    SOCIAL HISTORY: Social History   Social History  . Marital status: Married    Spouse name: N/A  . Number of children: N/A  . Years of education: N/A   Occupational History  . Not on file.   Social History Main Topics  . Smoking status: Current Every Day Smoker    Packs/day: 1.50    Years: 46.00    Types: Cigarettes  . Smokeless tobacco: Never Used  . Alcohol use No     Comment: quit 2000  . Drug use: No  . Sexual activity: No   Other Topics Concern  . Not on file   Social History Narrative   Lives with wife, adopted daughter, adopted son   Occ: Games developer   Edu: 8th grade     FAMILY HISTORY: Family History  Problem Relation Age of Onset  . Cancer Paternal Aunt        unsure details  . Diabetes Cousin   . Cancer Paternal Uncle        unknow type cancer   . Prostate cancer Neg Hx   . Kidney cancer Neg Hx   . CAD Neg Hx   . Stroke Neg Hx   . Colon cancer Neg Hx     ALLERGIES:  is allergic to penicillins.  MEDICATIONS:  Current Outpatient Prescriptions  Medication Sig Dispense Refill  . aspirin EC 81 MG tablet Take 1 tablet (81 mg total) by mouth daily.    Marland Kitchen HYDROcodone-acetaminophen (NORCO/VICODIN) 5-325 MG tablet Take 1-2 tablets by mouth every 6 (six) hours as needed for moderate pain or severe pain. 15 tablet 0   Current Facility-Administered Medications  Medication Dose Route Frequency  Provider Last Rate Last Dose  . 0.9 %  sodium chloride infusion  500 mL Intravenous Continuous Ladene Artist, MD        REVIEW OF SYSTEMS:  Constitutional: Denies fevers, chills or abnormal night sweats Eyes: Denies blurriness of vision, double vision or watery eyes Ears, nose, mouth, throat, and face: Denies mucositis or sore throat Respiratory: Denies cough, dyspnea or wheezes Cardiovascular: Denies palpitation, chest discomfort or lower extremity swelling Gastrointestinal:  Denies nausea, heartburn or change in bowel habits Skin: Denies abnormal skin rashes Lymphatics: Denies new lymphadenopathy or easy bruising Neurological:Denies numbness, tingling or new weaknesses Behavioral/Psych: Mood is stable, no new changes  All other systems were reviewed with the patient and are negative.  PHYSICAL EXAMINATION:  ECOG PERFORMANCE STATUS: 1 - Symptomatic but completely ambulatory  Vitals:   06/05/16 1156  BP: 113/68  Pulse: (!) 58  Resp: 18  Temp: 97.6 F (36.4 C)  Filed Weights   06/05/16 1156  Weight: 116 lb 3.2 oz (52.7 kg)    GENERAL:alert, no distress and comfortable SKIN: skin color, texture, turgor are normal, no rashes or significant lesions EYES: normal, conjunctiva are pink and non-injected, sclera clear OROPHARYNX:no exudate, no erythema and lips, buccal mucosa, and tongue normal  NECK: supple, thyroid normal size, non-tender, without nodularity LYMPH:  no palpable lymphadenopathy in the cervical, axillary or inguinal LUNGS: clear to auscultation and percussion with normal breathing effort HEART: regular rate & rhythm and no murmurs and no lower extremity edema ABDOMEN:abdomen soft, non-tender and normal bowel sounds, surgical incisions are well healed  Musculoskeletal:no cyanosis of digits and no clubbing  PSYCH: alert & oriented x 3 with fluent speech NEURO: no focal motor/sensory deficits  LABORATORY DATA:  I have reviewed the data as listed CBC Latest Ref  Rng & Units 05/24/2016 05/23/2016 05/22/2016  WBC 4.0 - 10.5 K/uL 11.1(H) 10.6(H) 14.3(H)  Hemoglobin 13.0 - 17.0 g/dL 14.8 13.5 12.8(L)  Hematocrit 39.0 - 52.0 % 42.9 39.2 37.4(L)  Platelets 150 - 400 K/uL 164 159 151    Pathology report  Diagnosis 05/21/16 1. Colon, segmental resection for tumor, sigmoid - INVASIVE ADENOCARCINOMA, WELL TO MODERATELY DIFFERENTIATED, WITH ABUNDANT MUCIN (PT3 PN1) - METASTATIC ADENOCARCINOMA IN ONE OF TWENTY-TWO LYMPH NODES (1/22) - TWO TUMOR DEPOSITS PRESENT - MARGINS UNINVOLVED BY CARCINOMA - HYPERPLASTIC POLYPS (X2) - SEE ONCOLOGY TABLE BELOW 2. Colon, resection margin (donut), final distal margin - BENIGN COLON - NO MALIGNANCY IDENTIFIED Microscopic Comment 1. ONCOLOGY TABLE: COLON Specimen: Sigmoid colon Procedure: Sigmoidectomy Tumor site: Sigmoid Macroscopic tumor perforation: Not identified Invasive tumor: 3.4 x 2.5 x 0.6 cm Histologic type(s): Adenocarcinoma with abundant mucin Histologic grade and differentiation: G1- G2 Microscopic extension of invasive tumor: Tumor invades muscularis propria Lymph-Vascular invasion: Not identified Perineural invasion: Not identified Treatment Effect: No known presurgical therapy Tumor deposit(s): Present, 2 Resection margins: All margins are uninvolved by invasive carcinoma, high-grade dysplasia, intramucosal adenocarcinoma, and adenoma Margins examined: Proximal, distal and Radial Distance closest margin (if all above margins negative): 3.6 cm (distal) Additional polyp(s): Hyperplastic polyps (x2) Lymph nodes: number examined 22; number positive: 1 1 of 5 Supplemental copy SUPPLEMENTAL for KEVAN, PROUTY [phone]) Microscopic Comment(continued) Pathologic Staging: pT3, pN1 Ancillary studies: performed on block 1G (MMR by immunohistochemistry and MSI by PCR; will be reported separately.) Dr. Lyndon Code has reviewed the pertinent slide(1E) and agrees that adenocarcinoma is present in this  sample.  RADIOGRAPHIC STUDIES: I have personally reviewed the radiological images as listed and agreed with the findings in the report. No results found.   CT CHEST W CONTRAST 04/23/16 IMPRESSION: 1. No evidence of pulmonary metastasis. 2. Bilateral calcified pulmonary nodules and mediastinal lymph nodes consistent with granulomatous disease. 3. Centrilobular emphysema. 4. Coronary artery calcification and aortic atherosclerotic calcification.  CT A/P W CONTRAST 03/14/16 IMPRESSION: Short segment area of wall thickening in the distal sigmoid colon, suspicious for site of primary colon carcinoma. Recommend correlation with colonoscopy results.  No evidence of local or distant metastatic disease. No evidence of bowel obstruction.  PROCEDURES Colonoscopy 03/12/2016 Four 6 to 8 mm polyps in the rectum and in the transverse colon, removed with a cold snare. Resected and retrieved. - Rule out malignancy, tumor in the sigmoid colon. Biopsied. Tattooed. - Internal hemorrhoids. - The examination was otherwise normal on direct and retroflexion views.   ASSESSMENT & PLAN:  1. 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 Due to his stage IIIB disease, he is at high risk for recurrence -We discussed the standard adjuvant chemotherapy for stage III colon cancer,  reduce the risk of cancer recurrence. -we discussed the regimens for adjuvant chemotherapy, including FOLFOX, CAPOX, or single agent Xeloda or 5-FU.  he is 37, but does not have much medical comorbidities, and recovered surgery quickly, I recommend him to consider FOLFOX -We discussed the duration of adjuvant chemotherapy. Based on the recent reported IDEA study in ASCO 2017, I  recommend 3 months therapy.  --Chemotherapy consent: Side effects including but does not not limited to, fatigue, nausea, vomiting, diarrhea, hair loss, neuropathy, fluid retention, renal and kidney dysfunction, neutropenic fever, needed for blood transfusion, bleeding, coronary artery spasm and heart attack, were discussed with patient in great detail. He agrees to proceed. -The goal of therapy is curative --I discussed the risk of cancer recurrence in the future. I discussed the surveillance plan after adjuvant chemotherapy, which is a physical exam and lab test (including CBC, CMP and CEA) every 3 months for the first 2 years, then every 6-12 months, colonoscopy in one year, and surveilliance CT scan every 6-12 month for up to 5 year.  -I'll ask Dr. [name] to put a port in -chemo class -We will tentatively start chemotherapy on 6/11   2. Smoking cessation  - I strongly encouraged him to work towards quitting. I recommended Nicotine patches or counseling to help him quit.  3. Emphysema 4. BPH 5. History of kidney stone  PLAN -plan to start adjuvant chemo FOLFOX on 6/11, every 2 weeks for 3 months  - message dr. [name] for port placement - schedule chemo class -Lab, fresh, follow-up and chemotherapy on June 11  Orders Placed This Encounter  Procedures  . CBC with Differential    Standing Status:   Standing    Number of Occurrences:   50    Standing Expiration Date:   06/05/2021  . Comprehensive metabolic panel    Standing Status:   Standing    Number of Occurrences:   50    Standing Expiration Date:   06/05/2021  . CEA    Standing Status:   Standing    Number of Occurrences:   50    Standing Expiration Date:   06/05/2021    All questions were answered. The patient knows to call the clinic with any problems, questions or concerns. I spent 55 minutes counseling the patient face to face. The total time spent in the appointment was 60 minutes and more than 50% was on  counseling.  This document serves as a record of services personally performed by Truitt Merle, MD. It was created on her behalf by Brandt Loosen, a trained medical cribe. The creation of this record is based on the scribe's personal observations and the provider's statements to them. This document has been checked and approved by the attending provider.     Truitt Merle, MD 06/05/2016

## 2016-06-05 ENCOUNTER — Ambulatory Visit (HOSPITAL_BASED_OUTPATIENT_CLINIC_OR_DEPARTMENT_OTHER): Payer: Medicare Other | Admitting: Hematology

## 2016-06-05 ENCOUNTER — Telehealth: Payer: Self-pay | Admitting: Hematology

## 2016-06-05 ENCOUNTER — Encounter: Payer: Self-pay | Admitting: Hematology

## 2016-06-05 VITALS — BP 113/68 | HR 58 | Temp 97.6°F | Resp 18 | Ht 65.0 in | Wt 116.2 lb

## 2016-06-05 DIAGNOSIS — C187 Malignant neoplasm of sigmoid colon: Secondary | ICD-10-CM | POA: Diagnosis not present

## 2016-06-05 DIAGNOSIS — J439 Emphysema, unspecified: Secondary | ICD-10-CM | POA: Diagnosis not present

## 2016-06-05 DIAGNOSIS — N400 Enlarged prostate without lower urinary tract symptoms: Secondary | ICD-10-CM | POA: Diagnosis not present

## 2016-06-05 DIAGNOSIS — C772 Secondary and unspecified malignant neoplasm of intra-abdominal lymph nodes: Secondary | ICD-10-CM | POA: Diagnosis not present

## 2016-06-05 DIAGNOSIS — Z809 Family history of malignant neoplasm, unspecified: Secondary | ICD-10-CM | POA: Diagnosis not present

## 2016-06-05 DIAGNOSIS — Z720 Tobacco use: Secondary | ICD-10-CM | POA: Diagnosis not present

## 2016-06-05 DIAGNOSIS — Z87442 Personal history of urinary calculi: Secondary | ICD-10-CM | POA: Diagnosis not present

## 2016-06-05 NOTE — Telephone Encounter (Signed)
Scheduled appt per 5/22 - Gave patient AVS and calender per 5/22 los.

## 2016-06-05 NOTE — Telephone Encounter (Signed)
Scheduled treatment per LOS - no treatment plan in yet.

## 2016-06-06 ENCOUNTER — Encounter: Payer: Self-pay | Admitting: Hematology

## 2016-06-06 NOTE — Progress Notes (Signed)
START ON PATHWAY REGIMEN - Colorectal     A cycle is every 14 days:     Oxaliplatin      Leucovorin      5-Fluorouracil      5-Fluorouracil   **Always confirm dose/schedule in your pharmacy ordering system**    Patient Characteristics: Colon Adjuvant, Stage III, Low Risk (T1-3, N1) Current evidence of distant metastases? No AJCC T Category: T3 AJCC N Category: N1 AJCC M Category: M0 AJCC 8 Stage Grouping: IIIB  Intent of Therapy: Curative Intent, Discussed with Patient

## 2016-06-07 ENCOUNTER — Other Ambulatory Visit: Payer: Self-pay | Admitting: General Surgery

## 2016-06-07 ENCOUNTER — Ambulatory Visit: Payer: Medicare Other | Admitting: Hematology

## 2016-06-07 MED ORDER — DEXTROSE 5 % IV SOLN: 600.0000 mg | INTRAVENOUS | Status: AC

## 2016-06-12 ENCOUNTER — Other Ambulatory Visit: Payer: Medicare Other

## 2016-06-12 ENCOUNTER — Telehealth: Payer: Self-pay | Admitting: *Deleted

## 2016-06-12 NOTE — Telephone Encounter (Signed)
No additional note

## 2016-06-13 ENCOUNTER — Encounter (HOSPITAL_BASED_OUTPATIENT_CLINIC_OR_DEPARTMENT_OTHER): Payer: Self-pay | Admitting: *Deleted

## 2016-06-13 NOTE — Progress Notes (Signed)
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 Resected and retrieved. - Rule out malignancy, tumor in the sigmoid colon. Biopsied. Tattooed. - Internal hemorrhoids. - The examination was otherwise normal on direct and retroflexion views.       03/14/2016 Imaging    CT A/P W CONTRAST  IMPRESSION: Short segment area of wall thickening in the distal sigmoid colon, suspicious for site of primary colon carcinoma. Recommend correlation with colonoscopy results.      04/23/2016 Imaging    CT Chest W Contrast IMPRESSION: 1. No evidence of pulmonary metastasis. 2. Bilateral calcified pulmonary nodules and mediastinal lymph nodes consistent with granulomatous disease. 3. Centrilobular emphysema. 4. Coronary artery calcification and aortic atherosclerotic calcification.      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 Colon, segmental resection for tumor, sigmoid - INVASIVE ADENOCARCINOMA, WELL TO MODERATELY DIFFERENTIATED, WITH ABUNDANT MUCIN (PT3 PN1) -  METASTATIC ADENOCARCINOMA IN ONE OF TWENTY-TWO LYMPH NODES (1/22) - TWO TUMOR DEPOSITS PRESENT - MARGINS UNINVOLVED BY CARCINOMA - HYPERPLASTIC POLYPS (X2) - SEE ONCOLOGY TABLE BELOW 2. Colon, resection margin (donut), final distal margin - BENIGN COLON - NO MALIGNANCY IDENTIFIED      05/21/2016 Surgery    LAPAROSCOPIC PARTIAL COLECTOMY, SPLENIC FLEXURE MOBILIZATION AND PROCTOSCOPY       HISTORY OF PRESENTING ILLNESS:  Mario Mcdowell 66 y.o. male is here because of recently diagnosed colorectalcancer. He has reported to Dr. Leighton Mcdowell on 1/0/17 for a evaluation of a rectosigmoid colon mass. 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 He denied any change in his bowel movement or habits of rectal bleeding.   He had a screening colonoscopy by Dr. Fuller Mcdowell 03/12/2016. Who referred him to Mario Mcdowell. He denies any symptoms before. Before surgery, no changes in weight loss or change in energy level  Since the surgery, his appetite has been good and his energy level is fine.No pain reported from the incision. He takes hydrocodone for his pain.  August 08, 2015 he was admitted to the hospital for right lower quadrant abdominal pain due to a kidney stone.  He presents to the clinic today with his wife for consult of his stage 3 colon cancer and chemotherapy. The patient's wife says he doesn't go to the doctor often. He is complaint and wants to do whatever he can to get treated. He is worried about what he can eat when he can start chemo.  CURRENT THERAPY: adjuvant FOLFOX every 2 weeks, starting 06/25/2016.  INTERVAL HISTORY: Mario Mcdowell returns for follow up and first cycle of chemotherapy. He believes he has  recovered well from surgery. He is somewhat worried about chemotherapy.  MEDICAL HISTORY:  Past Medical History:  Diagnosis Date  . Aorto-iliac atherosclerosis ([place])    by CT; in bilateral legs   . Arthritis   . Atherosclerotic PVD with intermittent claudication ([place])     . BPH without obstruction/lower urinary tract symptoms 10/25/2015  . Centrilobular emphysema ([place])    per chest CT 04-23-2016  . Colon adenocarcinoma [facility]) oncologist- dr Mario Mcdowell   dx 05-21-2016 -- Stage IIIB (pT3,pN1,M0) , Invasive sigmoid adenocarcinoma well to moderately differentiated with metastatic intra-abdominal lymph node (1 out of 22), s/p lap. partial colectomy Mario Mcdowell) 05/21/2016 and chemotherapy is planned  . Full dentures   . History of kidney stones 07/2015   passed without intervention  . Pulmonary nodules    per chest CT 04-23-2016 bilateral nodules (consistent w/ granulomatous disease)  . Smokers' cough ([place])   . Wears contact lenses     SURGICAL HISTORY: Past Surgical History:  Procedure Laterality Date  . COLONOSCOPY  03/12/2016   4 polyps, sigmoid tumor biopsied (TVA w/ MICROSCOPIC FOCUS SUSPICIOUS FOR INVASION) pending surgical resection, rpt 1 yr Mario Mcdowell)  . LAPAROSCOPIC PARTIAL COLECTOMY N/A 05/21/2016   colon cancer, 1/20 positive lymp nodes. LAPAROSCOPIC PARTIAL COLECTOMY, SPLENIC FLEXURE MOBILIZATION AND PROCTOSCOPY;  Mario Ruff, MD)  . MULTIPLE TOOTH EXTRACTIONS  1980's  . PORTACATH PLACEMENT N/A 06/21/2016   Procedure: INSERTION PORT-A-CATH;  Surgeon: Mario Ruff, MD;  [facility]  Service: General;  Laterality: N/A;  right    SOCIAL HISTORY: Social History   Social History  . Marital status: Married    Spouse name: N/A  . Number of children: N/A  . Years of education: N/A   Occupational History  . Not on file.   Social History Main Topics  . Smoking status: Current Every Day Smoker    Packs/day: 0.25    Years: 46.00    Types: Cigarettes  . Smokeless tobacco: Never Used     Comment: PT TRYING TO QUIT DOWN TO 1PP3 DAYS FROM 1.5PPD  . Alcohol use No     Comment: quit 2000  . Drug use: No  . Sexual activity: No   Other Topics Concern  . Not on file   Social History Narrative   Lives with wife, adopted daughter,  adopted son   Occ: Games developer   Edu: 8th grade     FAMILY HISTORY: Family History  Problem Relation Age of Onset  . Cancer Paternal Aunt        unsure details  . Diabetes Cousin   . Cancer Paternal Uncle        unknow type cancer   . Prostate cancer Neg Hx   . Kidney cancer Neg Hx   . CAD Neg Hx   . Stroke Neg Hx   . Colon cancer Neg Hx     ALLERGIES:  is allergic to penicillins.  MEDICATIONS:  Current Outpatient Prescriptions  Medication Sig Dispense Refill  . acetaminophen (TYLENOL) 500 MG chewable tablet Chew 500 mg by mouth as needed for pain.    Marland Kitchen aspirin EC 81 MG tablet Take 1 tablet (81 mg total) by mouth daily. (Patient taking differently: Take 81 mg by mouth every other day. )    . lidocaine-prilocaine (EMLA) cream Apply to affected area once 30 g 3  . ondansetron (ZOFRAN) 8 MG tablet Take 1 tablet (8 mg total) by mouth 2 (two) times daily as needed for refractory nausea / vomiting.  Start on day 3 after chemotherapy. (Patient not taking: Reported on 06/25/2016) 30 tablet 1  . prochlorperazine (COMPAZINE) 10 MG tablet Take 1 tablet (10 mg total) by mouth every 6 (six) hours as needed (Nausea or vomiting). (Patient not taking: Reported on 06/25/2016) 30 tablet 1   No current facility-administered medications for this visit.     REVIEW OF SYSTEMS:  Constitutional: Denies fevers, chills or abnormal night sweats Eyes: Denies blurriness of vision, double vision or watery eyes Ears, nose, mouth, throat, and face: Denies mucositis or sore throat Respiratory: Denies cough, dyspnea or wheezes Cardiovascular: Denies palpitation, chest discomfort or lower extremity swelling Gastrointestinal:  Denies nausea, heartburn or change in bowel habits Skin: Denies abnormal skin rashes Lymphatics: Denies new lymphadenopathy or easy bruising Neurological:Denies numbness, tingling or new weaknesses Behavioral/Psych: Mood is stable, no new changes  All other systems were reviewed with the  patient and are negative.  PHYSICAL EXAMINATION:  ECOG PERFORMANCE STATUS: 1 - Symptomatic but completely ambulatory  Vitals:   06/25/16 1010  BP: 116/75  Pulse: 62  Resp: 18  Temp: 98.4 F (36.9 C)   Filed Weights   06/25/16 1010  Weight: 115 lb 11.2 oz (52.5 kg)    GENERAL:alert, no distress and comfortable SKIN: skin color, texture, turgor are normal, no rashes or significant lesions EYES: normal, conjunctiva are pink and non-injected, sclera clear OROPHARYNX:no exudate, no erythema and lips, buccal mucosa, and tongue normal  NECK: supple, thyroid normal size, non-tender, without nodularity LYMPH:  no palpable lymphadenopathy in the cervical, axillary or inguinal LUNGS: clear to auscultation and percussion with normal breathing effort HEART: regular rate & rhythm and no murmurs and no lower extremity edema ABDOMEN:abdomen soft, non-tender and normal bowel sounds, surgical incisions are well healed  Musculoskeletal:no cyanosis of digits and no clubbing  PSYCH: alert & oriented x 3 with fluent speech NEURO: no focal motor/sensory deficits  LABORATORY DATA:  I have reviewed the data as listed CBC Latest Ref Rng & Units 06/25/2016 05/24/2016 05/23/2016  WBC 4.0 - 10.3 10e3/uL 7.6 11.1(H) 10.6(H)  Hemoglobin 13.0 - 17.1 g/dL 14.5 14.8 13.5  Hematocrit 38.4 - 49.9 % 41.9 42.9 39.2  Platelets 140 - 400 10e3/uL 172 164 159   CMP Latest Ref Rng & Units 05/24/2016 05/23/2016 05/22/2016  Glucose 65 - 99 mg/dL 95 101(H) 108(H)  BUN 6 - 20 mg/dL _0 Creatinine 0.61 - 1.24 mg/dL 0.87 0.90 0.86  Sodium 135 - 145 mmol/L 136 138 137  Potassium 3.5 - 5.1 mmol/L 4.1 4.4 4.1  Chloride 101 - 111 mmol/L 102 107 107  CO2 22 - 32 mmol/L _1 Calcium 8.9 - 10.3 mg/dL 9.0 8.7(L) 8.3(L)  Total Protein 6.0 - 8.3 g/dL - - -  Total Bilirubin 0.2 - 1.2 mg/dL - - -  Alkaline Phos 39 - 117 U/L - - -  AST 0 - 37 U/L - - -  ALT 0 - 53 U/L - - -    Pathology report  Diagnosis 05/21/16 1.  Colon, segmental resection for tumor, sigmoid - INVASIVE ADENOCARCINOMA, WELL TO MODERATELY DIFFERENTIATED, WITH ABUNDANT MUCIN (PT3 PN1) - METASTATIC ADENOCARCINOMA IN ONE OF TWENTY-TWO LYMPH NODES (1/22) - TWO TUMOR DEPOSITS PRESENT - MARGINS UNINVOLVED BY CARCINOMA - HYPERPLASTIC POLYPS (X2) - SEE ONCOLOGY TABLE BELOW 2. Colon, resection margin (donut), final distal margin - BENIGN COLON - NO MALIGNANCY IDENTIFIED Microscopic Comment 1. ONCOLOGY TABLE: COLON Specimen: Sigmoid colon Procedure: Sigmoidectomy Tumor site: Sigmoid Macroscopic tumor  perforation: Not identified Invasive tumor: 3.4 x 2.5 x 0.6 cm Histologic type(s): Adenocarcinoma with abundant mucin Histologic grade and differentiation: G1- G2 Microscopic extension of invasive tumor: Tumor invades muscularis propria Lymph-Vascular invasion: Not identified Perineural invasion: Not identified Treatment Effect: No known presurgical therapy Tumor deposit(s): Present, 2 Resection margins: All margins are uninvolved by invasive carcinoma, high-grade dysplasia, intramucosal adenocarcinoma, and adenoma Margins examined: Proximal, distal and Radial Distance closest margin (if all above margins negative): 3.6 cm (distal) Additional polyp(s): Hyperplastic polyps (x2) Lymph nodes: number examined 22; number positive: 1 1 of 5 Supplemental copy SUPPLEMENTAL for RAYVEN, HENDRICKSON [phone]) Microscopic Comment(continued) Pathologic Staging: pT3, pN1 Ancillary studies: performed on block 1G (MMR by immunohistochemistry and MSI by PCR; will be reported separately.) Dr. Lyndon Code has reviewed the pertinent slide(1E) and agrees that adenocarcinoma is present in this sample.  RADIOGRAPHIC STUDIES: I have personally reviewed the radiological images as listed and agreed with the findings in the report. Dg Chest Port 1 View  Result Date: 06/21/2016 CLINICAL DATA:  Status post Port-A-Cath placement. EXAM: PORTABLE CHEST 1 VIEW COMPARISON:  CT  scan chest of April 23, 2016 FINDINGS: The lungs are well-expanded. There is no pneumothorax or hemothorax. There multiple subcentimeter calcified nodules throughout both lungs. There is no alveolar infiltrate or parenchymal mass. The heart and pulmonary vascularity are normal. The porta catheter tip projects over the middle third of the SVC. The bony thorax exhibits no acute abnormality. IMPRESSION: No immediate postprocedure complication following Port-A-Cath appliance placement. Emphysema.  Evidence of previous granulomatous infection. Electronically Signed   By: David  Martinique M.D.   On: 06/21/2016 13:07   Dg C-arm 1-60 Min-no Report  Result Date: 06/21/2016 Fluoroscopy was utilized by the requesting physician.  No radiographic interpretation.     CT CHEST W CONTRAST 04/23/16 IMPRESSION: 1. No evidence of pulmonary metastasis. 2. Bilateral calcified pulmonary nodules and mediastinal lymph nodes consistent with granulomatous disease. 3. Centrilobular emphysema. 4. Coronary artery calcification and aortic atherosclerotic calcification.  CT A/P W CONTRAST 03/14/16 IMPRESSION: Short segment area of wall thickening in the distal sigmoid colon, suspicious for site of primary colon carcinoma. Recommend correlation with colonoscopy results.  No evidence of local or distant metastatic disease. No evidence of bowel obstruction.  PROCEDURES Colonoscopy 03/12/2016 Four 6 to 8 mm polyps in the rectum and in the transverse colon, removed with a cold snare. Resected and retrieved. - Rule out malignancy, tumor in the sigmoid colon. Biopsied. Tattooed. - Internal hemorrhoids. - The examination was otherwise normal on direct and retroflexion views.   ASSESSMENT & Mcdowell:  66 y.o.  male  1. 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 -We previously discussed the risk of cancer recurrence after surgery. Due to his stage IIIB disease, he is at high risk for recurrence -We previously discussed the standard adjuvant chemotherapy for stage III colon cancer,  reduce the risk of cancer recurrence. -We previously discussed the regimens for adjuvant chemotherapy, including FOLFOX, CAPOX, or single agent Xeloda or 5-FU. He is 29, but does not have many medical comorbidities and recovered from surgery quickly. I recommended him to consider FOLFOX -We previously discussed the duration of adjuvant chemotherapy. Based on the recent reported IDEA study in ASCO 2017, I recommended 3 months therapy.  -Chemotherapy consent was obtained on last visit, I again  reviewed the potential side effects with patient, especially neutropenic fever precaution, cold sensitivity, neuropathy etc. He agrees to proceed. -The goal of therapy is curative. -We discussed hand hygiene and staying away from large crowds. He would like to attend his daughter's graduation on 06/26/16. I told him this is fine since his blood counts don't fall too quickly the day after chemotherapy. -Labs reviewed today, we will start first line FOLFOX today, and continue every 2 weeks. -He has Zofran and Compazine at home, he will take as needed for nausea.  2. Smoking cessation  - I strongly encouraged him to work towards quitting. I recommended Nicotine patches or counseling to help him quit.  3. Emphysema 4. BPH 5. History of kidney stones  Mcdowell -Lab reviewed, start adjuvant chemo FOLFOX today and continue every 2 weeks for 3 months  -Lab, flush, f/u, and chemo FOLFOX in 2 and 4 weeks. He will see APP on 07/09/16 and see me in 4 weeks   No orders of the defined types were placed in this encounter.   All questions were answered. The patient knows to call the clinic with any problems, questions or concerns.  I spent 20  minutes counseling the patient face to face. The total time spent in the appointment was 25 minutes and more than 50% was on counseling.     Mario Merle, MD 06/25/2016   This document serves as a record of services personally performed by Mario Merle, MD. It was created on her behalf by Darcus Austin, a trained medical scribe. The creation of this record is based on the scribe's personal observations and the provider's statements to them. This document has been checked and approved by the attending provider.

## 2016-06-14 ENCOUNTER — Encounter: Payer: Self-pay | Admitting: *Deleted

## 2016-06-14 ENCOUNTER — Encounter (HOSPITAL_BASED_OUTPATIENT_CLINIC_OR_DEPARTMENT_OTHER): Payer: Self-pay | Admitting: *Deleted

## 2016-06-14 ENCOUNTER — Other Ambulatory Visit: Payer: Medicare Other

## 2016-06-14 NOTE — Progress Notes (Signed)
SPOKE W/ WIFE AND PT.  NPO AFTER MN.  ARRIVE AT 0945.  CURRENT LAB RESULTS IN CHART AND EPIC.

## 2016-06-15 ENCOUNTER — Other Ambulatory Visit: Payer: Self-pay | Admitting: Hematology

## 2016-06-15 DIAGNOSIS — C772 Secondary and unspecified malignant neoplasm of intra-abdominal lymph nodes: Principal | ICD-10-CM

## 2016-06-15 DIAGNOSIS — C187 Malignant neoplasm of sigmoid colon: Secondary | ICD-10-CM

## 2016-06-15 MED ORDER — PROCHLORPERAZINE MALEATE 10 MG PO TABS: 10.0000 mg | ORAL_TABLET | Freq: Four times a day (QID) | ORAL | 1 refills | Status: DC | PRN

## 2016-06-15 MED ORDER — LIDOCAINE-PRILOCAINE 2.5-2.5 % EX CREA: TOPICAL_CREAM | CUTANEOUS | 3 refills | Status: DC

## 2016-06-15 MED ORDER — ONDANSETRON HCL 8 MG PO TABS: 8.0000 mg | ORAL_TABLET | Freq: Two times a day (BID) | ORAL | 1 refills | Status: DC | PRN

## 2016-06-19 ENCOUNTER — Encounter: Payer: Self-pay | Admitting: Hematology

## 2016-06-19 NOTE — Progress Notes (Signed)
Submitted auth request for Lidocaine/Prilocaine today.  Status is pending.  °

## 2016-06-19 NOTE — Progress Notes (Signed)
Pt's Lidocaine-Prilocaine has been approved from 06/19/16 to 06/19/17.

## 2016-06-20 ENCOUNTER — Other Ambulatory Visit: Payer: Medicare Other

## 2016-06-20 MED ORDER — SODIUM CHLORIDE 0.9 % IV SOLN
Freq: Once | INTRAVENOUS | Status: AC
  Administered 2016-06-21: 24 mL
  Filled 2016-06-20 (×3): qty 1.2

## 2016-06-21 ENCOUNTER — Ambulatory Visit (HOSPITAL_COMMUNITY): Payer: Medicare Other

## 2016-06-21 ENCOUNTER — Encounter (HOSPITAL_BASED_OUTPATIENT_CLINIC_OR_DEPARTMENT_OTHER)
Admission: RE | Disposition: A | Discharge status: Home / Self Care | Payer: Self-pay | Source: Ambulatory Visit | Attending: General Surgery

## 2016-06-21 ENCOUNTER — Encounter: Payer: Self-pay | Admitting: Hematology

## 2016-06-21 ENCOUNTER — Ambulatory Visit (HOSPITAL_BASED_OUTPATIENT_CLINIC_OR_DEPARTMENT_OTHER): Payer: Medicare Other | Admitting: Anesthesiology

## 2016-06-21 ENCOUNTER — Encounter (HOSPITAL_BASED_OUTPATIENT_CLINIC_OR_DEPARTMENT_OTHER): Payer: Self-pay

## 2016-06-21 ENCOUNTER — Ambulatory Visit (HOSPITAL_BASED_OUTPATIENT_CLINIC_OR_DEPARTMENT_OTHER)
Admission: RE | Admit: 2016-06-21 | Discharge: 2016-06-21 | Disposition: A | Discharge status: Home / Self Care | Payer: Medicare Other | Source: Ambulatory Visit | Attending: General Surgery | Admitting: General Surgery

## 2016-06-21 DIAGNOSIS — N400 Enlarged prostate without lower urinary tract symptoms: Secondary | ICD-10-CM | POA: Diagnosis not present

## 2016-06-21 DIAGNOSIS — J449 Chronic obstructive pulmonary disease, unspecified: Secondary | ICD-10-CM | POA: Diagnosis not present

## 2016-06-21 DIAGNOSIS — M1990 Unspecified osteoarthritis, unspecified site: Secondary | ICD-10-CM | POA: Insufficient documentation

## 2016-06-21 DIAGNOSIS — Z880 Allergy status to penicillin: Secondary | ICD-10-CM | POA: Insufficient documentation

## 2016-06-21 DIAGNOSIS — Z452 Encounter for adjustment and management of vascular access device: Secondary | ICD-10-CM | POA: Insufficient documentation

## 2016-06-21 DIAGNOSIS — E785 Hyperlipidemia, unspecified: Secondary | ICD-10-CM | POA: Diagnosis not present

## 2016-06-21 DIAGNOSIS — C189 Malignant neoplasm of colon, unspecified: Secondary | ICD-10-CM | POA: Insufficient documentation

## 2016-06-21 DIAGNOSIS — J439 Emphysema, unspecified: Secondary | ICD-10-CM | POA: Diagnosis not present

## 2016-06-21 DIAGNOSIS — C187 Malignant neoplasm of sigmoid colon: Secondary | ICD-10-CM | POA: Diagnosis not present

## 2016-06-21 DIAGNOSIS — Z95828 Presence of other vascular implants and grafts: Secondary | ICD-10-CM

## 2016-06-21 DIAGNOSIS — F17200 Nicotine dependence, unspecified, uncomplicated: Secondary | ICD-10-CM | POA: Insufficient documentation

## 2016-06-21 DIAGNOSIS — I739 Peripheral vascular disease, unspecified: Secondary | ICD-10-CM | POA: Insufficient documentation

## 2016-06-21 DIAGNOSIS — Z7982 Long term (current) use of aspirin: Secondary | ICD-10-CM | POA: Insufficient documentation

## 2016-06-21 HISTORY — DX: Centrilobular emphysema: J43.2

## 2016-06-21 HISTORY — DX: Simple chronic bronchitis: J41.0

## 2016-06-21 HISTORY — DX: Presence of spectacles and contact lenses: Z97.3

## 2016-06-21 HISTORY — DX: Other nonspecific abnormal finding of lung field: R91.8

## 2016-06-21 HISTORY — DX: Complete loss of teeth, unspecified cause, unspecified class: K08.109

## 2016-06-21 HISTORY — DX: Personal history of urinary calculi: Z87.442

## 2016-06-21 HISTORY — DX: Unspecified osteoarthritis, unspecified site: M19.90

## 2016-06-21 HISTORY — DX: Presence of dental prosthetic device (complete) (partial): Z97.2

## 2016-06-21 HISTORY — DX: Atherosclerosis of native arteries of extremities with intermittent claudication, unspecified extremity: I70.219

## 2016-06-21 HISTORY — PX: PORTACATH PLACEMENT: SHX2246

## 2016-06-21 SURGERY — INSERTION, TUNNELED CENTRAL VENOUS DEVICE, WITH PORT
Anesthesia: Monitor Anesthesia Care | Site: Chest

## 2016-06-21 MED ORDER — OXYCODONE HCL 5 MG/5ML PO SOLN
5.0000 mg | Freq: Once | ORAL | Status: DC | PRN
  Filled 2016-06-21: qty 5

## 2016-06-21 MED ORDER — ACETAMINOPHEN 325 MG PO TABS
650.0000 mg | ORAL_TABLET | ORAL | Status: DC | PRN
  Filled 2016-06-21: qty 2

## 2016-06-21 MED ORDER — ONDANSETRON HCL 4 MG/2ML IJ SOLN
INTRAMUSCULAR | Status: AC
  Filled 2016-06-21: qty 2

## 2016-06-21 MED ORDER — ONDANSETRON HCL 4 MG/2ML IJ SOLN
4.0000 mg | Freq: Once | INTRAMUSCULAR | Status: DC | PRN
  Filled 2016-06-21: qty 2

## 2016-06-21 MED ORDER — ONDANSETRON HCL 4 MG/2ML IJ SOLN
INTRAMUSCULAR | Status: DC | PRN
  Administered 2016-06-21: 4 mg via INTRAVENOUS

## 2016-06-21 MED ORDER — SODIUM CHLORIDE 0.9% FLUSH
3.0000 mL | INTRAVENOUS | Status: DC | PRN
  Filled 2016-06-21: qty 3

## 2016-06-21 MED ORDER — MIDAZOLAM HCL 5 MG/5ML IJ SOLN
INTRAMUSCULAR | Status: DC | PRN
  Administered 2016-06-21: 2 mg via INTRAVENOUS

## 2016-06-21 MED ORDER — LACTATED RINGERS IV SOLN
INTRAVENOUS | Status: DC
  Administered 2016-06-21: 10:00:00 via INTRAVENOUS
  Filled 2016-06-21: qty 1000

## 2016-06-21 MED ORDER — SODIUM CHLORIDE 0.9% FLUSH
3.0000 mL | Freq: Two times a day (BID) | INTRAVENOUS | Status: DC
  Filled 2016-06-21: qty 3

## 2016-06-21 MED ORDER — CEFAZOLIN SODIUM 1 G IJ SOLR
INTRAMUSCULAR | Status: AC
  Filled 2016-06-21: qty 20

## 2016-06-21 MED ORDER — WHITE PETROLATUM GEL
Status: AC
  Filled 2016-06-21: qty 10

## 2016-06-21 MED ORDER — FENTANYL CITRATE (PF) 100 MCG/2ML IJ SOLN
INTRAMUSCULAR | Status: DC | PRN
  Administered 2016-06-21: 12.5 ug via INTRAVENOUS
  Administered 2016-06-21 (×2): 25 ug via INTRAVENOUS
  Administered 2016-06-21: 12.5 ug via INTRAVENOUS

## 2016-06-21 MED ORDER — FENTANYL CITRATE (PF) 100 MCG/2ML IJ SOLN
INTRAMUSCULAR | Status: AC
  Filled 2016-06-21: qty 2

## 2016-06-21 MED ORDER — OXYCODONE HCL 5 MG PO TABS
5.0000 mg | ORAL_TABLET | Freq: Once | ORAL | Status: DC | PRN
  Filled 2016-06-21: qty 1

## 2016-06-21 MED ORDER — BUPIVACAINE HCL 0.25 % IJ SOLN
INTRAMUSCULAR | Status: DC | PRN
  Administered 2016-06-21: 14 mL

## 2016-06-21 MED ORDER — MIDAZOLAM HCL 2 MG/2ML IJ SOLN
INTRAMUSCULAR | Status: AC
  Filled 2016-06-21: qty 2

## 2016-06-21 MED ORDER — HEPARIN SOD (PORK) LOCK FLUSH 100 UNIT/ML IV SOLN
INTRAVENOUS | Status: DC | PRN
  Administered 2016-06-21: 9 [IU] via INTRAVENOUS

## 2016-06-21 MED ORDER — ACETAMINOPHEN 650 MG RE SUPP
650.0000 mg | RECTAL | Status: DC | PRN
  Filled 2016-06-21: qty 1

## 2016-06-21 MED ORDER — LIDOCAINE 2% (20 MG/ML) 5 ML SYRINGE
INTRAMUSCULAR | Status: AC
  Filled 2016-06-21: qty 5

## 2016-06-21 MED ORDER — CEFAZOLIN SODIUM-DEXTROSE 2-3 GM-% IV SOLR
INTRAVENOUS | Status: DC | PRN
  Administered 2016-06-21 (×2): 1 g via INTRAVENOUS

## 2016-06-21 MED ORDER — PROPOFOL 500 MG/50ML IV EMUL
INTRAVENOUS | Status: DC | PRN
  Administered 2016-06-21: 200 ug/kg/min via INTRAVENOUS

## 2016-06-21 MED ORDER — SODIUM CHLORIDE 0.9 % IV SOLN
250.0000 mL | INTRAVENOUS | Status: DC | PRN
  Filled 2016-06-21: qty 250

## 2016-06-21 MED ORDER — FENTANYL CITRATE (PF) 100 MCG/2ML IJ SOLN
25.0000 ug | INTRAMUSCULAR | Status: DC | PRN
  Filled 2016-06-21: qty 1

## 2016-06-21 MED ORDER — PROPOFOL 10 MG/ML IV BOLUS
INTRAVENOUS | Status: AC
  Filled 2016-06-21: qty 40

## 2016-06-21 SURGICAL SUPPLY — 39 items
BAG DECANTER FOR FLEXI CONT (MISCELLANEOUS) ×2 IMPLANT
BLADE SURG 15 STRL LF DISP TIS (BLADE) ×1 IMPLANT
BLADE SURG 15 STRL SS (BLADE) ×1
CHLORAPREP W/TINT 26ML (MISCELLANEOUS) ×2 IMPLANT
COVER BACK TABLE 60X90IN (DRAPES) ×2 IMPLANT
COVER MAYO STAND STRL (DRAPES) ×2 IMPLANT
COVER PROBE U/S 5X48 (MISCELLANEOUS) ×2 IMPLANT
DECANTER SPIKE VIAL GLASS SM (MISCELLANEOUS) ×2 IMPLANT
DERMABOND ADVANCED (GAUZE/BANDAGES/DRESSINGS) ×1
DERMABOND ADVANCED .7 DNX12 (GAUZE/BANDAGES/DRESSINGS) ×1 IMPLANT
DRAPE C-ARM 42X72 X-RAY (DRAPES) ×2 IMPLANT
DRAPE LAPAROTOMY TRNSV 102X78 (DRAPE) ×2 IMPLANT
DRAPE UTILITY XL STRL (DRAPES) ×2 IMPLANT
DRSG TEGADERM 4X4.75 (GAUZE/BANDAGES/DRESSINGS) ×2 IMPLANT
ELECT REM PT RETURN 9FT ADLT (ELECTROSURGICAL) ×2
ELECTRODE REM PT RTRN 9FT ADLT (ELECTROSURGICAL) ×1 IMPLANT
GAUZE SPONGE 4X4 12PLY STRL (GAUZE/BANDAGES/DRESSINGS) ×4 IMPLANT
GAUZE SPONGE 4X4 16PLY XRAY LF (GAUZE/BANDAGES/DRESSINGS) ×2 IMPLANT
GLOVE BIO SURGEON STRL SZ 6.5 (GLOVE) ×2 IMPLANT
GLOVE BIOGEL PI IND STRL 7.0 (GLOVE) ×1 IMPLANT
GLOVE BIOGEL PI INDICATOR 7.0 (GLOVE) ×1
GOWN STRL REUS W/TWL 2XL LVL3 (GOWN DISPOSABLE) ×2 IMPLANT
GOWN STRL REUS W/TWL XL LVL3 (GOWN DISPOSABLE) ×2 IMPLANT
KIT PORT POWER 8FR ISP CVUE (Catheter) ×2 IMPLANT
KIT RM TURNOVER CYSTO AR (KITS) ×2 IMPLANT
NEEDLE HYPO 25X1 1.5 SAFETY (NEEDLE) ×2 IMPLANT
PACK BASIN DAY SURGERY FS (CUSTOM PROCEDURE TRAY) ×2 IMPLANT
PENCIL BUTTON HOLSTER BLD 10FT (ELECTRODE) ×2 IMPLANT
SPONGE GAUZE 2X2 8PLY STRL LF (GAUZE/BANDAGES/DRESSINGS) ×4 IMPLANT
SUT PROLENE 2 0 SH DA (SUTURE) ×2 IMPLANT
SUT VIC AB 3-0 SH 27 (SUTURE) ×1
SUT VIC AB 3-0 SH 27X BRD (SUTURE) ×1 IMPLANT
SUT VIC AB 4-0 PS2 18 (SUTURE) ×2 IMPLANT
SYR CONTROL 10ML LL (SYRINGE) ×2 IMPLANT
SYRINGE 10CC LL (SYRINGE) ×2 IMPLANT
TOWEL OR 17X24 6PK STRL BLUE (TOWEL DISPOSABLE) ×4 IMPLANT
TOWEL OR NON WOVEN STRL DISP B (DISPOSABLE) ×2 IMPLANT
TRAY DSU PREP LF (CUSTOM PROCEDURE TRAY) IMPLANT
TUBE CONNECTING 12X1/4 (SUCTIONS) IMPLANT

## 2016-06-21 NOTE — H&P (Signed)
The patient is a 66 year old male who presents with colorectal cancer. 66 year old male who presents to the office for evaluation of a rectosigmoid colon mass. 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  Colonoscopy was performed for screening purposes. His weight has been stable. He is a long-time 1 pack per day smoker. He is otherwise healthy. He denies abdominal pain. he has never had any abdominal surgeries.  He underwent lap sigmoidectomy several weeks ago and has been recovering well at home.  Pathology showed a T3N1 adenocarcinoma.   Past Surgical History  Colon Polyp Removal - Colonoscopy Lap partial colectomy   Diagnostic Studies History  Colonoscopy within last year  Allergies  Penicillin G Sodium *PENICILLINS*  Medication History  Aspirin (81MG  Tablet, Oral) Active. Medications Reconciled  Social History Alcohol use Remotely quit alcohol use. Caffeine use Carbonated beverages, Coffee, Tea. No drug use Tobacco use Current every day smoker.  Family History  Family history unknown First Degree Relatives  Other Problems  Kidney Stone Melanoma     Review of Systems  General Not Present- Appetite Loss, Chills, Fatigue, Fever, Night Sweats, Weight Gain and Weight Loss. Skin Not Present- Change in Wart/Mole, Dryness, Hives, Jaundice, New Lesions, Non-Healing Wounds, Rash and Ulcer. HEENT Present- Wears glasses/contact lenses. Not Present- Earache, Hearing Loss, Hoarseness, Nose Bleed, Oral Ulcers, Ringing in the Ears, Seasonal Allergies, Sinus Pain, Sore Throat, Visual Disturbances and Yellow Eyes. Breast Not Present- Breast Mass, Breast Pain, Nipple Discharge and Skin Changes. Cardiovascular Present- Leg Cramps. Not Present- Chest Pain, Difficulty Breathing Lying Down, Palpitations, Rapid Heart Rate, Shortness of Breath and Swelling of Extremities. Gastrointestinal Not Present- Abdominal Pain, Bloating, Bloody Stool, Change  in Bowel Habits, Chronic diarrhea, Constipation, Difficulty Swallowing, Excessive gas, Gets full quickly at meals, Hemorrhoids, Indigestion, Nausea, Rectal Pain and Vomiting. Male Genitourinary Not Present- Blood in Urine, Change in Urinary Stream, Frequency, Impotence, Nocturia, Painful Urination, Urgency and Urine Leakage. Musculoskeletal Not Present- Back Pain, Joint Pain, Joint Stiffness, Muscle Pain, Muscle Weakness and Swelling of Extremities. Neurological Not Present- Decreased Memory, Fainting, Headaches, Numbness, Seizures, Tingling, Tremor, Trouble walking and Weakness. Psychiatric Not Present- Anxiety, Bipolar, Change in Sleep Pattern, Depression, Fearful and Frequent crying. Endocrine Not Present- Cold Intolerance, Excessive Hunger, Hair Changes, Heat Intolerance, Hot flashes and New Diabetes. Hematology Present- Blood Thinners and Easy Bruising. Not Present- Excessive bleeding, Gland problems, HIV and Persistent Infections.  BP 133/74 (BP Location: Left Arm, Patient Position: Sitting)   Pulse 75   Temp 97.8 F (36.6 C) (Oral)   Resp 19   Ht 5\' 5"  (1.651 m)   Wt 50.6 kg (111 lb 8 oz)   SpO2 99%   BMI 18.55 kg/m    Physical Exam  General Mental Status-Alert. General Appearance-Not in acute distress. Build & Nutrition-Well nourished. Posture-Normal posture. Gait-Normal.  Head and Neck Head-normocephalic, atraumatic with no lesions or palpable masses. Trachea-midline.  Chest and Lung Exam Chest and lung exam reveals -on auscultation, normal breath sounds, no adventitious sounds and normal vocal resonance.  Cardiovascular Cardiovascular examination reveals -normal heart sounds, regular rate and rhythm with no murmurs and no digital clubbing, cyanosis, edema, increased warmth or tenderness.  Abdomen Inspection Inspection of the abdomen reveals - No Hernias.  Incision clean, dry, intact Palpation/Percussion Palpation and Percussion of the abdomen  reveal - Soft, Non Tender, No Rigidity (guarding), No hepatosplenomegaly and No Palpable abdominal masses.  Neurologic Neurologic evaluation reveals -alert and oriented x 3 with no impairment of recent or remote memory, normal  attention span and ability to concentrate, normal sensation and normal coordination.  Musculoskeletal Normal Exam - Bilateral-Upper Extremity Strength Normal and Lower Extremity Strength Normal.    Assessment & Plan   Pt with stage 3 colon cancer.  He has elected to proceed with the recommended IV chemotherapy regimen.  We will place a port to facilitate this.  Risks include bleeding, infection and malfunction of device.

## 2016-06-21 NOTE — Transfer of Care (Signed)
Immediate Anesthesia Transfer of Care Note  Patient: Mario Mcdowell  Procedure(s) Performed: Procedure(s) with comments: INSERTION PORT-A-CATH (N/A) - right  Patient Location: PACU  Anesthesia Type:MAC  Level of Consciousness: awake, alert , oriented and patient cooperative  Airway & Oxygen Therapy: Patient Spontanous Breathing and Patient connected to nasal cannula oxygen  Post-op Assessment: Report given to RN and Post -op Vital signs reviewed and stable  Post vital signs: Reviewed and stable  Last Vitals:  Vitals:   06/21/16 0900  BP: 133/74  Pulse: 75  Resp: 19  Temp: 36.6 C    Last Pain:  Vitals:   06/21/16 0900  TempSrc: Oral      Patients Stated Pain Goal: 5 (87/68/11 [id number])  Complications: No apparent anesthesia complications

## 2016-06-21 NOTE — Discharge Instructions (Addendum)
Post Anesthesia Home Care Instructions  Activity: Get plenty of rest for the remainder of the day. A responsible individual must stay with you for 24 hours following the procedure.  For the next 24 hours, DO NOT: -Drive a car -Operate machinery -Drink alcoholic beverages -Take any medication unless instructed by your physician -Make any legal decisions or sign important papers.  Meals: Start with liquid foods such as gelatin or soup. Progress to regular foods as tolerated. Avoid greasy, spicy, heavy foods. If nausea and/or vomiting occur, drink only clear liquids until the nausea and/or vomiting subsides. Call your physician if vomiting continues.  Special Instructions/Symptoms: Your throat may feel dry or sore from the anesthesia or the breathing tube placed in your throat during surgery. If this causes discomfort, gargle with warm salt water. The discomfort should disappear within 24 hours.  If you had a scopolamine patch placed behind your ear for the management of post- operative nausea and/or vomiting:  1. The medication in the patch is effective for 72 hours, after which it should be removed.  Wrap patch in a tissue and discard in the trash. Wash hands thoroughly with soap and water. 2. You may remove the patch earlier than 72 hours if you experience unpleasant side effects which may include dry mouth, dizziness or visual disturbances. 3. Avoid touching the patch. Wash your hands with soap and water after contact with the patch.                 PORT-A-CATH: POST OP INSTRUCTIONS  Always review your discharge instruction sheet given to you by the facility where your surgery was performed.   1. A prescription for pain medication may be given to you upon discharge. Take your pain medication as prescribed, if needed. If narcotic pain medicine is not needed, then you make take acetaminophen (Tylenol) or ibuprofen (Advil) as needed.  2. Take your usually prescribed  medications unless otherwise directed. 3. If you need a refill on your pain medication, please contact our office. All narcotic pain medicine now requires a paper prescription.  Phoned in and fax refills are no longer allowed by law.  Prescriptions will not be filled after 5 pm or on weekends.  4. You should follow a light diet for the remainder of the day after your procedure. 5. Most patients will experience some mild swelling and/or bruising in the area of the incision. It may take several days to resolve. 6. It is common to experience some constipation if taking pain medication after surgery. Increasing fluid intake and taking a stool softener (such as Colace) will usually help or prevent this problem from occurring. A mild laxative (Milk of Magnesia or Miralax) should be taken according to package directions if there are no bowel movements after 48 hours.  7. Unless discharge instructions indicate otherwise, you may remove your bandages 48 hours after surgery, and you may shower at that time. You may have steri-strips (small white skin tapes) in place directly over the incision.  These strips should be left on the skin for 7-10 days.  If your surgeon used Dermabond (skin glue) on the incision, you may shower in 24 hours.  The glue will flake off over the next 2-3 weeks.  8. If your port is left accessed at the end of surgery (needle left in port), the dressing cannot get wet and should only by changed by a healthcare professional. When the port is no longer accessed (when the needle has been removed), follow step   7.   9. ACTIVITIES:  Limit activity involving your arms for the next 72 hours. Do no strenuous exercise or activity for 1 week. You may drive when you are no longer taking prescription pain medication, you can comfortably wear a seatbelt, and you can maneuver your car. 10.You may need to see your doctor in the office for a follow-up appointment.  Please       check with your doctor.  11.When  you receive a new Port-a-Cath, you will get a product guide and        ID card.  Please keep them in case you need them.  WHEN TO CALL YOUR DOCTOR ([phone]): 1. Fever over 101.0 2. Chills 3. Continued bleeding from incision 4. Increased redness and tenderness at the site 5. Shortness of breath, difficulty breathing   The clinic staff is available to answer your questions during regular business hours. Please don't hesitate to call and ask to speak to one of the nurses or medical assistants for clinical concerns. If you have a medical emergency, go to the nearest emergency room or call 911.  A surgeon from [facility] is always on call at the hospital.     For further information, please visit [email or web address]     

## 2016-06-21 NOTE — Anesthesia Procedure Notes (Signed)
Procedure Name: MAC Date/Time: 06/21/2016 10:55 AM Performed by: Wanita Chamberlain Pre-anesthesia Checklist: Patient identified, Timeout performed, Emergency Drugs available, Suction available and Patient being monitored Patient Re-evaluated:Patient Re-evaluated prior to inductionOxygen Delivery Method: Nasal cannula Intubation Type: IV induction Placement Confirmation: breath sounds checked- equal and bilateral and positive ETCO2 Dental Injury: Teeth and Oropharynx as per pre-operative assessment

## 2016-06-21 NOTE — Anesthesia Preprocedure Evaluation (Addendum)
Anesthesia Evaluation  Patient identified by MRN, date of birth, ID band Patient awake    Reviewed: Allergy & Precautions, NPO status , Patient's Chart, lab work & pertinent test results  Airway Mallampati: II       Dental  (+) Lower Dentures, Upper Dentures   Pulmonary COPD, Current Smoker,    breath sounds clear to auscultation       Cardiovascular + Peripheral Vascular Disease  negative cardio ROS   Rhythm:Regular Rate:Normal     Neuro/Psych negative neurological ROS  negative psych ROS   GI/Hepatic Neg liver ROS, Hx Colon resection for CA   Endo/Other  negative endocrine ROS  Renal/GU negative Renal ROS     Musculoskeletal  (+) Arthritis ,   Abdominal   Peds  Hematology negative hematology ROS (+)   Anesthesia Other Findings   Reproductive/Obstetrics negative OB ROS                          Anesthesia Physical Anesthesia Plan  ASA: III  Anesthesia Plan: MAC   Post-op Pain Management:    Induction: Intravenous  PONV Risk Score and Plan: Propofol and Ondansetron  Airway Management Planned: Nasal Cannula, Simple Face Mask and Natural Airway  Additional Equipment:   Intra-op Plan:   Post-operative Plan:   Informed Consent: I have reviewed the patients History and Physical, chart, labs and discussed the procedure including the risks, benefits and alternatives for the proposed anesthesia with the patient or authorized representative who has indicated his/her understanding and acceptance.   Dental advisory given  Plan Discussed with: CRNA and Anesthesiologist  Anesthesia Plan Comments:        Anesthesia Quick Evaluation

## 2016-06-21 NOTE — Op Note (Signed)
06/21/2016  11:47 AM  PATIENT:  Mario Mcdowell  66 y.o. male  Patient Care Team: Ria Bush, MD as PCP - General (Family Medicine) Ladene Artist, MD as Consulting Physician (Gastroenterology) [name], MD as Consulting Physician (General Surgery) Truitt Merle, MD as Consulting Physician (Hematology)  PRE-OPERATIVE DIAGNOSIS:  Stage 3 colon cancer  POST-OPERATIVE DIAGNOSIS:  Stage 3 colon cancer  PROCEDURE:  Procedure(s): INSERTION PORT-A-CATH  SURGEON:  Surgeon(s): [name], MD  ANESTHESIA:   MAC  EBL:  Total I/O In: 700 [I.V.:700] Out: 35 [Blood:35]  DISPOSITION OF SPECIMEN:  N/A  COUNTS:  YES  PLAN OF CARE: Discharge to home after PACU  PATIENT DISPOSITION:  PACU - hemodynamically stable.  INDICATION: Patient with need for IV chemotherapy. Port-A-Cath placement was requested.   Use of a central venous catheter for intravenous therapy was discussed. Technique of catheter placement using ultrasound and fluoroscopy guidance was discussed. Risks such as bleeding, infection, pneumothorax, catheter occlusion, reoperation, and other risks were discussed. I noted a good likelihood this will help address the problem. Questions were answered. The patient expressed understanding & wishes to proceed.   Findings: Normal-appearing anatomy.   8 [place] power port. It goes through the right internal jugular vein   Procedure: Informed consent was confirmed. Patient was brought the operating room and positioned supine. Arms were tucked. The patient underwent deep sedation. Neck and chest were clipped and prepped and draped in a sterile fashion. A surgical timeout confirmed our plan. I placed a field block of local anesthesia on the chest  I entered into the right internal jugular on the first venipuncture using US guidance. Non-pulsatile blood was returned. I was not able to pass the wire.  Several further passes were attempted under US guidance.  I moved the entrance site  laterally and was then able to enter the vein.  Wire was easily passed into the vena cava and confirmed by fluoroscopy. I confirmed placement of the wire in the right side of the chest.  I made an incision in the lateral infraclavicular area and made a subcutaneous pocket. I used a dilator on the wire using Seldinger technique to dilate the tract under fluoroscopy. I placed the catheter into the sheath. I then peeled away the dilator sheath. I tunneled the power port from the puncture site to the chest pocket. I cut the catheter to appropriate length and attached it to the port using the plastic connector. The port was placed into the pocket and secured to the left anterior chest wall using 2-0 Prolene interrupted stitches x2. Catheter flushed well.  Fluoroscopy confirmed the tip in the distal SVC. Catheter aspirated and flushed well. On final fluoro reevaluation the tip seen to be in good position in the distal SVC.  I closed the wounds using 3-0 Vicryl interrupted sutures for the pocket and 4-0 Monocryl stitch was used to close the skin. Dermabond was used on the 2 incisions. CXR will be performed in PACU. Patient should go home later today. Catheter is okay to use.

## 2016-06-21 NOTE — Progress Notes (Signed)
Submitted auth request for Ondansetron today.  Status is pending.  °

## 2016-06-22 ENCOUNTER — Encounter (HOSPITAL_BASED_OUTPATIENT_CLINIC_OR_DEPARTMENT_OTHER): Payer: Self-pay | Admitting: General Surgery

## 2016-06-22 ENCOUNTER — Encounter: Payer: Self-pay | Admitting: Hematology

## 2016-06-22 NOTE — Anesthesia Postprocedure Evaluation (Signed)
Anesthesia Post Note  Patient: Mario Mcdowell  Procedure(s) Performed: Procedure(s) (LRB): INSERTION PORT-A-CATH (N/A)     Patient location during evaluation: PACU Anesthesia Type: MAC Level of consciousness: awake, awake and alert and oriented Pain management: pain level controlled Vital Signs Assessment: post-procedure vital signs reviewed and stable Respiratory status: spontaneous breathing, nonlabored ventilation and respiratory function stable Cardiovascular status: blood pressure returned to baseline Anesthetic complications: no    Last Vitals:  Vitals:   06/21/16 1330 06/21/16 1357  BP: 120/72 131/71  Pulse: (!) 56 (!) 58  Resp: 10 12  Temp:  36.4 C    Last Pain:  Vitals:   06/21/16 1357  TempSrc:   PainSc: 0-No pain                 [name]

## 2016-06-22 NOTE — Progress Notes (Signed)
Pt's Ondansetron has been approved from 06/21/16 to 06/21/17.

## 2016-06-24 ENCOUNTER — Encounter: Payer: Self-pay | Admitting: Hematology

## 2016-06-25 ENCOUNTER — Other Ambulatory Visit (HOSPITAL_BASED_OUTPATIENT_CLINIC_OR_DEPARTMENT_OTHER): Payer: Medicare Other

## 2016-06-25 ENCOUNTER — Encounter: Payer: Self-pay | Admitting: Hematology

## 2016-06-25 ENCOUNTER — Ambulatory Visit (HOSPITAL_BASED_OUTPATIENT_CLINIC_OR_DEPARTMENT_OTHER): Payer: Medicare Other | Admitting: Hematology

## 2016-06-25 ENCOUNTER — Ambulatory Visit (HOSPITAL_BASED_OUTPATIENT_CLINIC_OR_DEPARTMENT_OTHER): Payer: Medicare Other

## 2016-06-25 ENCOUNTER — Ambulatory Visit: Payer: Medicare Other

## 2016-06-25 VITALS — BP 116/75 | HR 62 | Temp 98.4°F | Resp 18 | Ht 65.0 in | Wt 115.7 lb

## 2016-06-25 DIAGNOSIS — Z5111 Encounter for antineoplastic chemotherapy: Secondary | ICD-10-CM

## 2016-06-25 DIAGNOSIS — C772 Secondary and unspecified malignant neoplasm of intra-abdominal lymph nodes: Secondary | ICD-10-CM

## 2016-06-25 DIAGNOSIS — C187 Malignant neoplasm of sigmoid colon: Secondary | ICD-10-CM

## 2016-06-25 DIAGNOSIS — Z720 Tobacco use: Secondary | ICD-10-CM

## 2016-06-25 LAB — CBC WITH DIFFERENTIAL/PLATELET
BASO%: 0.5 % (ref 0.0–2.0)
BASOS ABS: 0 10*3/uL (ref 0.0–0.1)
EOS ABS: 0.1 10*3/uL (ref 0.0–0.5)
EOS%: 1.3 % (ref 0.0–7.0)
HEMATOCRIT: 41.9 % (ref 38.4–49.9)
HEMOGLOBIN: 14.5 g/dL (ref 13.0–17.1)
LYMPH#: 3.1 10*3/uL (ref 0.9–3.3)
LYMPH%: 40.4 % (ref 14.0–49.0)
MCH: 32.1 pg (ref 27.2–33.4)
MCHC: 34.7 g/dL (ref 32.0–36.0)
MCV: 92.6 fL (ref 79.3–98.0)
MONO#: 0.6 10*3/uL (ref 0.1–0.9)
MONO%: 7.7 % (ref 0.0–14.0)
NEUT#: 3.8 10*3/uL (ref 1.5–6.5)
NEUT%: 50.1 % (ref 39.0–75.0)
PLATELETS: 172 10*3/uL (ref 140–400)
RBC: 4.53 10*6/uL (ref 4.20–5.82)
RDW: 14.4 % (ref 11.0–14.6)
WBC: 7.6 10*3/uL (ref 4.0–10.3)

## 2016-06-25 LAB — COMPREHENSIVE METABOLIC PANEL
ALBUMIN: 3.7 g/dL (ref 3.5–5.0)
ALK PHOS: 92 U/L (ref 40–150)
ALT: 29 U/L (ref 0–55)
AST: 29 U/L (ref 5–34)
Anion Gap: 8 mEq/L (ref 3–11)
BUN: 13.5 mg/dL (ref 7.0–26.0)
CALCIUM: 9.7 mg/dL (ref 8.4–10.4)
CO2: 26 mEq/L (ref 22–29)
CREATININE: 0.8 mg/dL (ref 0.7–1.3)
Chloride: 106 mEq/L (ref 98–109)
EGFR: >90 mL/min/{1.73_m2} (ref >90)
Glucose: 82 mg/dl (ref 70–140)
POTASSIUM: 4.4 meq/L (ref 3.5–5.1)
Sodium: 140 mEq/L (ref 136–145)
Total Bilirubin: 0.22 mg/dL (ref 0.20–1.20)
Total Protein: 7.1 g/dL (ref 6.4–8.3)

## 2016-06-25 LAB — CEA (IN HOUSE-CHCC): CEA (CHCC-IN HOUSE): 2.97 ng/mL (ref 0.00–5.00)

## 2016-06-25 MED ORDER — PALONOSETRON HCL INJECTION 0.25 MG/5ML
0.2500 mg | Freq: Once | INTRAVENOUS | Status: AC
  Administered 2016-06-25: 0.25 mg via INTRAVENOUS

## 2016-06-25 MED ORDER — FLUOROURACIL CHEMO INJECTION 2.5 GM/50ML
400.0000 mg/m2 | Freq: Once | INTRAVENOUS | Status: AC
  Administered 2016-06-25: 600 mg via INTRAVENOUS
  Filled 2016-06-25: qty 12

## 2016-06-25 MED ORDER — DEXTROSE 5 % IV SOLN
Freq: Once | INTRAVENOUS | Status: AC
  Administered 2016-06-25: 12:00:00 via INTRAVENOUS

## 2016-06-25 MED ORDER — OXALIPLATIN CHEMO INJECTION 100 MG/20ML
85.0000 mg/m2 | Freq: Once | INTRAVENOUS | Status: AC
  Administered 2016-06-25: 130 mg via INTRAVENOUS
  Filled 2016-06-25: qty 20

## 2016-06-25 MED ORDER — DEXTROSE 5 % IV SOLN
400.0000 mg/m2 | Freq: Once | INTRAVENOUS | Status: AC
  Administered 2016-06-25: 620 mg via INTRAVENOUS
  Filled 2016-06-25: qty 31

## 2016-06-25 MED ORDER — DEXAMETHASONE SODIUM PHOSPHATE 10 MG/ML IJ SOLN
10.0000 mg | Freq: Once | INTRAMUSCULAR | Status: AC
  Administered 2016-06-25: 10 mg via INTRAVENOUS

## 2016-06-25 MED ORDER — SODIUM CHLORIDE 0.9% FLUSH
10.0000 mL | Freq: Once | INTRAVENOUS | Status: AC
  Administered 2016-06-25: 10 mL via INTRAVENOUS
  Filled 2016-06-25: qty 10

## 2016-06-25 MED ORDER — DEXAMETHASONE SODIUM PHOSPHATE 10 MG/ML IJ SOLN
INTRAMUSCULAR | Status: AC
  Filled 2016-06-25: qty 1

## 2016-06-25 MED ORDER — PALONOSETRON HCL INJECTION 0.25 MG/5ML
INTRAVENOUS | Status: AC
  Filled 2016-06-25: qty 5

## 2016-06-25 MED ORDER — SODIUM CHLORIDE 0.9 % IV SOLN
2400.0000 mg/m2 | INTRAVENOUS | Status: DC
  Administered 2016-06-25: 3700 mg via INTRAVENOUS
  Filled 2016-06-25: qty 74

## 2016-06-25 NOTE — Patient Instructions (Signed)
Implanted Port Home Guide An implanted port is a type of central line that is placed under the skin. Central lines are used to provide IV access when treatment or nutrition needs to be given through a person's veins. Implanted ports are used for long-term IV access. An implanted port may be placed because:  You need IV medicine that would be irritating to the small veins in your hands or arms.  You need long-term IV medicines, such as antibiotics.  You need IV nutrition for a long period.  You need frequent blood draws for lab tests.  You need dialysis.  Implanted ports are usually placed in the chest area, but they can also be placed in the upper arm, the abdomen, or the leg. An implanted port has two main parts:  Reservoir. The reservoir is round and will appear as a small, raised area under your skin. The reservoir is the part where a needle is inserted to give medicines or draw blood.  Catheter. The catheter is a thin, flexible tube that extends from the reservoir. The catheter is placed into a large vein. Medicine that is inserted into the reservoir goes into the catheter and then into the vein.  How will I care for my incision site? Do not get the incision site wet. Bathe or shower as directed by your health care provider. How is my port accessed? Special steps must be taken to access the port:  Before the port is accessed, a numbing cream can be placed on the skin. This helps numb the skin over the port site.  Your health care provider uses a sterile technique to access the port. ? Your health care provider must put on a mask and sterile gloves. ? The skin over your port is cleaned carefully with an antiseptic and allowed to dry. ? The port is gently pinched between sterile gloves, and a needle is inserted into the port.  Only "non-coring" port needles should be used to access the port. Once the port is accessed, a blood return should be checked. This helps ensure that the port  is in the vein and is not clogged.  If your port needs to remain accessed for a constant infusion, a clear (transparent) bandage will be placed over the needle site. The bandage and needle will need to be changed every week, or as directed by your health care provider.  Keep the bandage covering the needle clean and dry. Do not get it wet. Follow your health care provider's instructions on how to take a shower or bath while the port is accessed.  If your port does not need to stay accessed, no bandage is needed over the port.  What is flushing? Flushing helps keep the port from getting clogged. Follow your health care provider's instructions on how and when to flush the port. Ports are usually flushed with saline solution or a medicine called heparin. The need for flushing will depend on how the port is used.  If the port is used for intermittent medicines or blood draws, the port will need to be flushed: ? After medicines have been given. ? After blood has been drawn. ? As part of routine maintenance.  If a constant infusion is running, the port may not need to be flushed.  How long will my port stay implanted? The port can stay in for as long as your health care provider thinks it is needed. When it is time for the port to come out, surgery will be   done to remove it. The procedure is similar to the one performed when the port was put in. When should I seek immediate medical care? When you have an implanted port, you should seek immediate medical care if:  You notice a bad smell coming from the incision site.  You have swelling, redness, or drainage at the incision site.  You have more swelling or pain at the port site or the surrounding area.  You have a fever that is not controlled with medicine.  This information is not intended to replace advice given to you by your health care provider. Make sure you discuss any questions you have with your health care provider. Document  Released: 01/01/2005 Document Revised: 06/09/2015 Document Reviewed: 09/08/2012 Elsevier Interactive Patient Education  2017 [employer]  

## 2016-06-25 NOTE — Patient Instructions (Signed)
[facility] Discharge Instructions for Patients Receiving Chemotherapy  Today you received the following chemotherapy agents oxaliplatin/leucovorin/fluorouracil  To help prevent nausea and vomiting after your treatment, we encourage you to take your nausea medication as directed If you develop nausea and vomiting that is not controlled by your nausea medication, call the clinic.   BELOW ARE SYMPTOMS THAT SHOULD BE REPORTED IMMEDIATELY:  *FEVER GREATER THAN 100.5 F  *CHILLS WITH OR WITHOUT FEVER  NAUSEA AND VOMITING THAT IS NOT CONTROLLED WITH YOUR NAUSEA MEDICATION  *UNUSUAL SHORTNESS OF BREATH  *UNUSUAL BRUISING OR BLEEDING  TENDERNESS IN MOUTH AND THROAT WITH OR WITHOUT PRESENCE OF ULCERS  *URINARY PROBLEMS  *BOWEL PROBLEMS  UNUSUAL RASH Items with * indicate a potential emergency and should be followed up as soon as possible.  Feel free to call the clinic you have any questions or concerns. The clinic phone number is [phone].  

## 2016-06-25 NOTE — Progress Notes (Signed)
Met w/ pt to introduce myself as his Arboriculturist.  Unfortunately there aren't any foundations offering copay assistance for his Dx and the type of ins he has.  I offered the [place], went over what it covers and gave him an expense sheet.  Pt would like to apply so his daughter will email me his proof of income.

## 2016-06-26 ENCOUNTER — Encounter: Payer: Self-pay | Admitting: Hematology

## 2016-06-26 NOTE — Progress Notes (Signed)
Pt is approved for the $400 [facility] grant.  °

## 2016-06-27 ENCOUNTER — Ambulatory Visit (HOSPITAL_BASED_OUTPATIENT_CLINIC_OR_DEPARTMENT_OTHER): Payer: Medicare Other

## 2016-06-27 ENCOUNTER — Telehealth: Payer: Self-pay | Admitting: *Deleted

## 2016-06-27 VITALS — BP 131/67 | HR 66 | Temp 98.2°F | Resp 18

## 2016-06-27 DIAGNOSIS — C772 Secondary and unspecified malignant neoplasm of intra-abdominal lymph nodes: Secondary | ICD-10-CM

## 2016-06-27 DIAGNOSIS — C187 Malignant neoplasm of sigmoid colon: Secondary | ICD-10-CM

## 2016-06-27 MED ORDER — SODIUM CHLORIDE 0.9% FLUSH
10.0000 mL | INTRAVENOUS | Status: DC | PRN
  Administered 2016-06-27: 10 mL
  Filled 2016-06-27: qty 10

## 2016-06-27 MED ORDER — HEPARIN SOD (PORK) LOCK FLUSH 100 UNIT/ML IV SOLN
500.0000 [IU] | Freq: Once | INTRAVENOUS | Status: AC | PRN
  Administered 2016-06-27: 500 [IU]
  Filled 2016-06-27: qty 5

## 2016-06-27 NOTE — Telephone Encounter (Signed)
Called pt to discuss how he did post FOLFOX.  Spoke with wife since pt is sleeping.  She report that she thinks he did well.  He had one episode of nausea 1st night & took nausea med & that relieved his nausea.  Discussed starting nausea med night of treatment even if not nauseated. He also felt a little constipated but bowels have moved post eating soup.  Discussed stool softener if constipation continues.  Informed to call if any further concerns or questions.  She expressed understanding.

## 2016-06-27 NOTE — Telephone Encounter (Signed)
-----   Message from Arty Baumgartner, RN sent at 06/25/2016  6:26 PM EDT ----- Regarding: 1st chemo/feng 1st time folfox.  Tolerated well.  Dr. Burr Medico

## 2016-06-27 NOTE — Patient Instructions (Signed)
Implanted Port Home Guide An implanted port is a type of central line that is placed under the skin. Central lines are used to provide IV access when treatment or nutrition needs to be given through a person's veins. Implanted ports are used for long-term IV access. An implanted port may be placed because:  You need IV medicine that would be irritating to the small veins in your hands or arms.  You need long-term IV medicines, such as antibiotics.  You need IV nutrition for a long period.  You need frequent blood draws for lab tests.  You need dialysis.  Implanted ports are usually placed in the chest area, but they can also be placed in the upper arm, the abdomen, or the leg. An implanted port has two main parts:  Reservoir. The reservoir is round and will appear as a small, raised area under your skin. The reservoir is the part where a needle is inserted to give medicines or draw blood.  Catheter. The catheter is a thin, flexible tube that extends from the reservoir. The catheter is placed into a large vein. Medicine that is inserted into the reservoir goes into the catheter and then into the vein.  How will I care for my incision site? Do not get the incision site wet. Bathe or shower as directed by your health care provider. How is my port accessed? Special steps must be taken to access the port:  Before the port is accessed, a numbing cream can be placed on the skin. This helps numb the skin over the port site.  Your health care provider uses a sterile technique to access the port. ? Your health care provider must put on a mask and sterile gloves. ? The skin over your port is cleaned carefully with an antiseptic and allowed to dry. ? The port is gently pinched between sterile gloves, and a needle is inserted into the port.  Only "non-coring" port needles should be used to access the port. Once the port is accessed, a blood return should be checked. This helps ensure that the port  is in the vein and is not clogged.  If your port needs to remain accessed for a constant infusion, a clear (transparent) bandage will be placed over the needle site. The bandage and needle will need to be changed every week, or as directed by your health care provider.  Keep the bandage covering the needle clean and dry. Do not get it wet. Follow your health care provider's instructions on how to take a shower or bath while the port is accessed.  If your port does not need to stay accessed, no bandage is needed over the port.  What is flushing? Flushing helps keep the port from getting clogged. Follow your health care provider's instructions on how and when to flush the port. Ports are usually flushed with saline solution or a medicine called heparin. The need for flushing will depend on how the port is used.  If the port is used for intermittent medicines or blood draws, the port will need to be flushed: ? After medicines have been given. ? After blood has been drawn. ? As part of routine maintenance.  If a constant infusion is running, the port may not need to be flushed.  How long will my port stay implanted? The port can stay in for as long as your health care provider thinks it is needed. When it is time for the port to come out, surgery will be   done to remove it. The procedure is similar to the one performed when the port was put in. When should I seek immediate medical care? When you have an implanted port, you should seek immediate medical care if:  You notice a bad smell coming from the incision site.  You have swelling, redness, or drainage at the incision site.  You have more swelling or pain at the port site or the surrounding area.  You have a fever that is not controlled with medicine.  This information is not intended to replace advice given to you by your health care provider. Make sure you discuss any questions you have with your health care provider. Document  Released: 01/01/2005 Document Revised: 06/09/2015 Document Reviewed: 09/08/2012 Elsevier Interactive Patient Education  2017 [employer]  

## 2016-06-28 ENCOUNTER — Telehealth: Payer: Self-pay | Admitting: Hematology

## 2016-06-28 NOTE — Telephone Encounter (Signed)
Patient bypassed scheduling on 06/25/16. Appointments scheduled and confirmed with patient, per 06/25/16 los. Per patient, will pick up a copy of updated schedule at next visit on 07/09/16.

## 2016-07-02 ENCOUNTER — Encounter: Payer: Self-pay | Admitting: Hematology

## 2016-07-02 NOTE — Progress Notes (Signed)
Submitted auth request for Promethazine today.  Status is pending.  °

## 2016-07-02 NOTE — Progress Notes (Signed)
Pt's Promethazine has been approved until 07/02/17.

## 2016-07-09 ENCOUNTER — Ambulatory Visit (HOSPITAL_BASED_OUTPATIENT_CLINIC_OR_DEPARTMENT_OTHER): Payer: Medicare Other | Admitting: Nurse Practitioner

## 2016-07-09 ENCOUNTER — Ambulatory Visit (HOSPITAL_BASED_OUTPATIENT_CLINIC_OR_DEPARTMENT_OTHER): Payer: Medicare Other

## 2016-07-09 ENCOUNTER — Ambulatory Visit: Payer: Medicare Other

## 2016-07-09 ENCOUNTER — Other Ambulatory Visit (HOSPITAL_BASED_OUTPATIENT_CLINIC_OR_DEPARTMENT_OTHER): Payer: Medicare Other

## 2016-07-09 ENCOUNTER — Telehealth: Payer: Self-pay | Admitting: Hematology

## 2016-07-09 VITALS — BP 127/67 | HR 63 | Temp 98.3°F | Resp 20 | Ht 65.0 in | Wt 116.0 lb

## 2016-07-09 DIAGNOSIS — C187 Malignant neoplasm of sigmoid colon: Secondary | ICD-10-CM | POA: Diagnosis not present

## 2016-07-09 DIAGNOSIS — C772 Secondary and unspecified malignant neoplasm of intra-abdominal lymph nodes: Secondary | ICD-10-CM

## 2016-07-09 DIAGNOSIS — R110 Nausea: Secondary | ICD-10-CM | POA: Diagnosis not present

## 2016-07-09 DIAGNOSIS — Z5111 Encounter for antineoplastic chemotherapy: Secondary | ICD-10-CM | POA: Diagnosis not present

## 2016-07-09 LAB — COMPREHENSIVE METABOLIC PANEL
ALBUMIN: 3.5 g/dL (ref 3.5–5.0)
ALK PHOS: 84 U/L (ref 40–150)
ALT: 27 U/L (ref 0–55)
AST: 23 U/L (ref 5–34)
Anion Gap: 9 mEq/L (ref 3–11)
BILIRUBIN TOTAL: 0.25 mg/dL (ref 0.20–1.20)
BUN: 9.4 mg/dL (ref 7.0–26.0)
CO2: 25 meq/L (ref 22–29)
CREATININE: 0.8 mg/dL (ref 0.7–1.3)
Calcium: 9.1 mg/dL (ref 8.4–10.4)
Chloride: 108 mEq/L (ref 98–109)
GLUCOSE: 127 mg/dL (ref 70–140)
Potassium: 3.9 mEq/L (ref 3.5–5.1)
SODIUM: 141 meq/L (ref 136–145)
TOTAL PROTEIN: 6.6 g/dL (ref 6.4–8.3)

## 2016-07-09 LAB — CBC WITH DIFFERENTIAL/PLATELET
BASO%: 0.7 % (ref 0.0–2.0)
Basophils Absolute: 0 10*3/uL (ref 0.0–0.1)
EOS ABS: 0.1 10*3/uL (ref 0.0–0.5)
EOS%: 1.5 % (ref 0.0–7.0)
HCT: 40.1 % (ref 38.4–49.9)
HEMOGLOBIN: 13.8 g/dL (ref 13.0–17.1)
LYMPH%: 47.3 % (ref 14.0–49.0)
MCH: 32 pg (ref 27.2–33.4)
MCHC: 34.5 g/dL (ref 32.0–36.0)
MCV: 92.9 fL (ref 79.3–98.0)
MONO#: 0.4 10*3/uL (ref 0.1–0.9)
MONO%: 7.6 % (ref 0.0–14.0)
NEUT%: 42.9 % (ref 39.0–75.0)
NEUTROS ABS: 2.4 10*3/uL (ref 1.5–6.5)
Platelets: 171 10*3/uL (ref 140–400)
RBC: 4.32 10*6/uL (ref 4.20–5.82)
RDW: 14.5 % (ref 11.0–14.6)
WBC: 5.5 10*3/uL (ref 4.0–10.3)
lymph#: 2.6 10*3/uL (ref 0.9–3.3)

## 2016-07-09 MED ORDER — PALONOSETRON HCL INJECTION 0.25 MG/5ML
0.2500 mg | Freq: Once | INTRAVENOUS | Status: AC
  Administered 2016-07-09: 0.25 mg via INTRAVENOUS

## 2016-07-09 MED ORDER — LEUCOVORIN CALCIUM INJECTION 350 MG
400.0000 mg/m2 | Freq: Once | INTRAVENOUS | Status: AC
  Administered 2016-07-09: 620 mg via INTRAVENOUS
  Filled 2016-07-09: qty 31

## 2016-07-09 MED ORDER — OXALIPLATIN CHEMO INJECTION 100 MG/20ML
85.0000 mg/m2 | Freq: Once | INTRAVENOUS | Status: AC
  Administered 2016-07-09: 130 mg via INTRAVENOUS
  Filled 2016-07-09: qty 20

## 2016-07-09 MED ORDER — DEXTROSE 5 % IV SOLN
Freq: Once | INTRAVENOUS | Status: AC
  Administered 2016-07-09: 11:00:00 via INTRAVENOUS

## 2016-07-09 MED ORDER — FOSAPREPITANT DIMEGLUMINE INJECTION 150 MG
Freq: Once | INTRAVENOUS | Status: AC
Start: 2016-07-09 — End: 2016-07-09
  Administered 2016-07-09: 12:00:00 via INTRAVENOUS
  Filled 2016-07-09: qty 5

## 2016-07-09 MED ORDER — FLUOROURACIL CHEMO INJECTION 2.5 GM/50ML
400.0000 mg/m2 | Freq: Once | INTRAVENOUS | Status: AC
  Administered 2016-07-09: 600 mg via INTRAVENOUS
  Filled 2016-07-09: qty 12

## 2016-07-09 MED ORDER — SODIUM CHLORIDE 0.9 % IV SOLN
2400.0000 mg/m2 | INTRAVENOUS | Status: DC
  Administered 2016-07-09: 3700 mg via INTRAVENOUS
  Filled 2016-07-09: qty 74

## 2016-07-09 MED ORDER — PALONOSETRON HCL INJECTION 0.25 MG/5ML
INTRAVENOUS | Status: AC
  Filled 2016-07-09: qty 5

## 2016-07-09 NOTE — Telephone Encounter (Signed)
Patient was given a copy of the appointment schedule. °

## 2016-07-09 NOTE — Progress Notes (Signed)
  [place] OFFICE PROGRESS NOTE   Diagnosis:  Colorectal cancer  INTERVAL HISTORY:   Mario Mcdowell returns as scheduled. He completed cycle 1 FOLFOX 06/25/2016. He had nausea beginning day 2. The nausea lasted for 3 days. Compazine and Zofran were not effective. He reports a third nausea medicine was called in which helped. No mouth sores. No diarrhea. He avoided cold for 5 days. He resumed cold at that time with no problems.  Objective:  Vital signs in last 24 hours:  Blood pressure 127/67, pulse 63, temperature 98.3 F (36.8 C), temperature source Oral, resp. rate 20, height '5\' 5"'$  (1.651 m), weight 116 lb (52.6 kg), SpO2 99 %.    HEENT: No thrush or ulcers. Resp: Lungs clear bilaterally. Cardio: Regular rate and rhythm. GI: Abdomen soft and nontender. No hepatomegaly. Vascular: No leg edema. Port-A-Cath without erythema.    Lab Results:  Lab Results  Component Value Date   WBC 5.5 07/09/2016   HGB 13.8 07/09/2016   HCT 40.1 07/09/2016   MCV 92.9 07/09/2016   PLT 171 07/09/2016   NEUTROABS 2.4 07/09/2016    Imaging:  No results found.  Medications: I have reviewed the patient's current medications.  Assessment/Plan: 1. 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 Cycle 2 FOLFOX 07/09/2016 2. Delayed nausea following cycle 1 FOLFOX. Emend added to the premedication regimen getting with cycle 2. 3. Smoking cessation 4. Emphysema 5. BPH 6. History of kidney stones   Disposition: Mario Mcdowell appears stable. He has completed 1 cycle of FOLFOX. Postchemotherapy course was complicated by delayed nausea. I added Emend to the premedications today. He understands to contact the office if he has nausea despite this change. He will return for a follow-up visit and cycle 3 FOLFOX in 2 weeks.    Ned Card ANP/GNP-BC   07/09/2016  10:51 AM

## 2016-07-11 ENCOUNTER — Ambulatory Visit (HOSPITAL_BASED_OUTPATIENT_CLINIC_OR_DEPARTMENT_OTHER): Payer: Medicare Other

## 2016-07-11 DIAGNOSIS — Z95828 Presence of other vascular implants and grafts: Secondary | ICD-10-CM

## 2016-07-11 DIAGNOSIS — Z452 Encounter for adjustment and management of vascular access device: Secondary | ICD-10-CM | POA: Diagnosis not present

## 2016-07-11 DIAGNOSIS — C772 Secondary and unspecified malignant neoplasm of intra-abdominal lymph nodes: Secondary | ICD-10-CM | POA: Diagnosis not present

## 2016-07-11 DIAGNOSIS — C187 Malignant neoplasm of sigmoid colon: Secondary | ICD-10-CM | POA: Diagnosis not present

## 2016-07-11 MED ORDER — HEPARIN SOD (PORK) LOCK FLUSH 100 UNIT/ML IV SOLN
500.0000 [IU] | Freq: Once | INTRAVENOUS | Status: AC
  Administered 2016-07-11: 500 [IU] via INTRAVENOUS
  Filled 2016-07-11: qty 5

## 2016-07-11 MED ORDER — SODIUM CHLORIDE 0.9% FLUSH
10.0000 mL | INTRAVENOUS | Status: DC | PRN
  Administered 2016-07-11: 10 mL via INTRAVENOUS
  Filled 2016-07-11: qty 10

## 2016-07-11 NOTE — Patient Instructions (Signed)

## 2016-07-19 ENCOUNTER — Other Ambulatory Visit: Payer: Self-pay | Admitting: Hematology

## 2016-07-23 ENCOUNTER — Ambulatory Visit (HOSPITAL_BASED_OUTPATIENT_CLINIC_OR_DEPARTMENT_OTHER): Payer: Medicare Other

## 2016-07-23 ENCOUNTER — Other Ambulatory Visit (HOSPITAL_BASED_OUTPATIENT_CLINIC_OR_DEPARTMENT_OTHER): Payer: Medicare Other

## 2016-07-23 ENCOUNTER — Ambulatory Visit: Payer: Medicare Other

## 2016-07-23 ENCOUNTER — Ambulatory Visit (HOSPITAL_BASED_OUTPATIENT_CLINIC_OR_DEPARTMENT_OTHER): Payer: Medicare Other | Admitting: Nurse Practitioner

## 2016-07-23 ENCOUNTER — Telehealth: Payer: Self-pay | Admitting: Hematology

## 2016-07-23 VITALS — BP 143/78 | HR 59 | Temp 98.0°F | Resp 18 | Ht 65.0 in | Wt 115.6 lb

## 2016-07-23 DIAGNOSIS — D6959 Other secondary thrombocytopenia: Secondary | ICD-10-CM | POA: Diagnosis not present

## 2016-07-23 DIAGNOSIS — C187 Malignant neoplasm of sigmoid colon: Secondary | ICD-10-CM

## 2016-07-23 DIAGNOSIS — Z5111 Encounter for antineoplastic chemotherapy: Secondary | ICD-10-CM | POA: Diagnosis not present

## 2016-07-23 DIAGNOSIS — C772 Secondary and unspecified malignant neoplasm of intra-abdominal lymph nodes: Secondary | ICD-10-CM

## 2016-07-23 DIAGNOSIS — Z95828 Presence of other vascular implants and grafts: Secondary | ICD-10-CM | POA: Insufficient documentation

## 2016-07-23 LAB — CBC WITH DIFFERENTIAL/PLATELET
BASO%: 0.9 % (ref 0.0–2.0)
Basophils Absolute: 0 10*3/uL (ref 0.0–0.1)
EOS%: 1.2 % (ref 0.0–7.0)
Eosinophils Absolute: 0.1 10*3/uL (ref 0.0–0.5)
HEMATOCRIT: 38.3 % — AB (ref 38.4–49.9)
HEMOGLOBIN: 13.1 g/dL (ref 13.0–17.1)
LYMPH#: 2.5 10*3/uL (ref 0.9–3.3)
LYMPH%: 50.4 % — ABNORMAL HIGH (ref 14.0–49.0)
MCH: 32.4 pg (ref 27.2–33.4)
MCHC: 34.2 g/dL (ref 32.0–36.0)
MCV: 94.7 fL (ref 79.3–98.0)
MONO#: 0.5 10*3/uL (ref 0.1–0.9)
MONO%: 10.5 % (ref 0.0–14.0)
NEUT#: 1.8 10*3/uL (ref 1.5–6.5)
NEUT%: 37 % — AB (ref 39.0–75.0)
Platelets: 124 10*3/uL — ABNORMAL LOW (ref 140–400)
RBC: 4.05 10*6/uL — ABNORMAL LOW (ref 4.20–5.82)
RDW: 15.2 % — AB (ref 11.0–14.6)
WBC: 4.9 10*3/uL (ref 4.0–10.3)

## 2016-07-23 LAB — COMPREHENSIVE METABOLIC PANEL
ALT: 21 U/L (ref 0–55)
AST: 19 U/L (ref 5–34)
Albumin: 3.5 g/dL (ref 3.5–5.0)
Alkaline Phosphatase: 98 U/L (ref 40–150)
Anion Gap: 7 mEq/L (ref 3–11)
BUN: 11.2 mg/dL (ref 7.0–26.0)
CALCIUM: 9.2 mg/dL (ref 8.4–10.4)
CHLORIDE: 107 meq/L (ref 98–109)
CO2: 27 mEq/L (ref 22–29)
CREATININE: 0.9 mg/dL (ref 0.7–1.3)
EGFR: 90 mL/min/{1.73_m2} — ABNORMAL LOW (ref >90)
GLUCOSE: 115 mg/dL (ref 70–140)
Potassium: 4.2 mEq/L (ref 3.5–5.1)
SODIUM: 141 meq/L (ref 136–145)
Total Bilirubin: 0.23 mg/dL (ref 0.20–1.20)
Total Protein: 6.5 g/dL (ref 6.4–8.3)

## 2016-07-23 MED ORDER — DEXTROSE 5 % IV SOLN
85.0000 mg/m2 | Freq: Once | INTRAVENOUS | Status: AC
  Administered 2016-07-23: 130 mg via INTRAVENOUS
  Filled 2016-07-23: qty 19

## 2016-07-23 MED ORDER — LEUCOVORIN CALCIUM INJECTION 350 MG
400.0000 mg/m2 | Freq: Once | INTRAVENOUS | Status: AC
  Administered 2016-07-23: 620 mg via INTRAVENOUS
  Filled 2016-07-23: qty 31

## 2016-07-23 MED ORDER — PALONOSETRON HCL INJECTION 0.25 MG/5ML
INTRAVENOUS | Status: AC
  Filled 2016-07-23: qty 5

## 2016-07-23 MED ORDER — SODIUM CHLORIDE 0.9% FLUSH
10.0000 mL | Freq: Once | INTRAVENOUS | Status: AC
  Administered 2016-07-23: 10 mL
  Filled 2016-07-23: qty 10

## 2016-07-23 MED ORDER — DEXTROSE 5 % IV SOLN
Freq: Once | INTRAVENOUS | Status: AC
  Administered 2016-07-23: 13:00:00 via INTRAVENOUS

## 2016-07-23 MED ORDER — SODIUM CHLORIDE 0.9 % IV SOLN
Freq: Once | INTRAVENOUS | Status: AC
  Administered 2016-07-23: 13:00:00 via INTRAVENOUS
  Filled 2016-07-23: qty 5

## 2016-07-23 MED ORDER — SODIUM CHLORIDE 0.9 % IV SOLN
2400.0000 mg/m2 | INTRAVENOUS | Status: DC
  Administered 2016-07-23: 3700 mg via INTRAVENOUS
  Filled 2016-07-23: qty 74

## 2016-07-23 MED ORDER — PALONOSETRON HCL INJECTION 0.25 MG/5ML
0.2500 mg | Freq: Once | INTRAVENOUS | Status: AC
Start: 2016-07-23 — End: 2016-07-23
  Administered 2016-07-23: 0.25 mg via INTRAVENOUS

## 2016-07-23 NOTE — Patient Instructions (Signed)
[facility] Discharge Instructions for Patients Receiving Chemotherapy  Today you received the following chemotherapy agents Oxaliplatin, Leucovorin, and 5 FU  To help prevent nausea and vomiting after your treatment, we encourage you to take your nausea medication as directed If you develop nausea and vomiting that is not controlled by your nausea medication, call the clinic.   BELOW ARE SYMPTOMS THAT SHOULD BE REPORTED IMMEDIATELY:  *FEVER GREATER THAN 100.5 F  *CHILLS WITH OR WITHOUT FEVER  NAUSEA AND VOMITING THAT IS NOT CONTROLLED WITH YOUR NAUSEA MEDICATION  *UNUSUAL SHORTNESS OF BREATH  *UNUSUAL BRUISING OR BLEEDING  TENDERNESS IN MOUTH AND THROAT WITH OR WITHOUT PRESENCE OF ULCERS  *URINARY PROBLEMS  *BOWEL PROBLEMS  UNUSUAL RASH Items with * indicate a potential emergency and should be followed up as soon as possible.  Feel free to call the clinic you have any questions or concerns. The clinic phone number is [phone].  Please show the CHEMO ALERT CARD at check-in to the Emergency Department and triage nurse.   

## 2016-07-23 NOTE — Telephone Encounter (Signed)
NO LOS 07/23/16

## 2016-07-23 NOTE — Progress Notes (Signed)
  [place] OFFICE PROGRESS NOTE   Diagnosis:  Colorectal cancer  INTERVAL HISTORY:   Mario Mcdowell returns as scheduled. He completed cycle 2 FOLFOX 07/09/2016. He had no significant nausea/vomiting. No mouth sores. No diarrhea. He had mild constipation relieved with a laxative. He avoids cold for about 7 days. No numbness or tingling today.  Objective:  Vital signs in last 24 hours:  Blood pressure (!) 143/78, pulse (!) 59, temperature 98 F (36.7 C), temperature source Oral, resp. rate 18, height '5\' 5"'$  (1.651 m), weight 115 lb 9.6 oz (52.4 kg), SpO2 100 %.    HEENT: No thrush or ulcers. Resp: Lungs clear bilaterally. Cardio: Distant heart sounds. Regular rate and rhythm. GI: No hepatomegaly. Vascular: No leg edema. Neuro: Vibratory sense intact over the fingertips per tuning fork exam.  Skin: Palms without erythema. Port-A-Cath without erythema.    Lab Results:  Lab Results  Component Value Date   WBC 4.9 07/23/2016   HGB 13.1 07/23/2016   HCT 38.3 (L) 07/23/2016   MCV 94.7 07/23/2016   PLT 124 (L) 07/23/2016   NEUTROABS 1.8 07/23/2016    Imaging:  No results found.  Medications: I have reviewed the patient's current medications.  Assessment/Plan: 1. 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 Cycle 2 FOLFOX 07/09/2016. Cycle 3 FOLFOX 07/23/2016 (5-FU bolus held due to mild thrombocytopenia). 2. Delayed nausea following cycle 1 FOLFOX. Emend added to the premedication regimen beginning with cycle 2. Improved. 3. Smoking cessation 4. Emphysema 5. BPH 6. History of kidney stones 7. Mild thrombocytopenia secondary to chemotherapy. 5-FU bolus held with cycle 3.   Disposition: Mario Mcdowell appears stable. He has completed 2 cycles of FOLFOX. Plan to proceed with cycle 3 today as scheduled. I reviewed today's labs with Dr. Burr Medico. The 5-FU bolus will be held with today's treatment  due to mild thrombocytopenia.  He will return for a follow-up visit and cycle 4 FOLFOX in 2 weeks. He will contact the office in the interim with any problems.  Plan reviewed with Dr. Burr Medico.   Ned Card ANP/GNP-BC   07/23/2016  12:35 PM

## 2016-07-24 ENCOUNTER — Encounter: Payer: Self-pay | Admitting: Family Medicine

## 2016-07-24 ENCOUNTER — Ambulatory Visit (INDEPENDENT_AMBULATORY_CARE_PROVIDER_SITE_OTHER): Payer: Medicare Other | Admitting: Family Medicine

## 2016-07-24 VITALS — BP 118/68 | HR 76 | Temp 98.1°F | Ht 65.0 in | Wt 116.0 lb

## 2016-07-24 DIAGNOSIS — C772 Secondary and unspecified malignant neoplasm of intra-abdominal lymph nodes: Secondary | ICD-10-CM

## 2016-07-24 DIAGNOSIS — C187 Malignant neoplasm of sigmoid colon: Secondary | ICD-10-CM | POA: Diagnosis not present

## 2016-07-24 DIAGNOSIS — G4700 Insomnia, unspecified: Secondary | ICD-10-CM | POA: Diagnosis not present

## 2016-07-24 MED ORDER — TRAZODONE HCL 50 MG PO TABS: 25.0000 mg | ORAL_TABLET | Freq: Every evening | ORAL | 3 refills | Status: DC | PRN

## 2016-07-24 NOTE — Assessment & Plan Note (Signed)
Reviewed recent care. Currently on FOLFOX infusion Q2 wks. Appreciate onc care.

## 2016-07-24 NOTE — Assessment & Plan Note (Signed)
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 Melatonin ineffective. Discussed sleep hygiene measures. Will trial trazodone 1/2-1 tab nightly. Pt will update me in 1-2 wks with effect.

## 2016-07-24 NOTE — Progress Notes (Signed)
BP 118/68   Pulse 76   Temp 98.1 F (36.7 C)   Ht '5\' 5"'$  (1.651 m)   Wt 116 lb (52.6 kg)   BMI 19.30 kg/m    CC: insomnia Subjective:    Patient ID: Mario Mcdowell, male    DOB: [birth year], 66 y.o.   MRN: [id number]  HPI: Tierra Divelbiss is a 66 y.o. male presenting on 07/24/2016 for Acute Visit ([place])   Recent dx colon adenocarcinoma with intra-abd node met s/p partial colectomy, currently on folfox chemotherapy Q2wks. Appetite ok.   Acute worsening insomnia over the past few months. Bedtime 9pm, trouble falling asleep for 1-2 hours. No trouble staying asleep. Bedtime routine is TV until 9:30pm. He does occasionally during the day. Caffeine - 2 cups in the morning. No alcohol.   Has tried melatonin without effect.  Has not tried benadryl.   Smoking down to 1/2 ppd.   Ongoing bilateral hip pain with radiation down to feet worse with ambulation.   Relevant past medical, surgical, family and social history reviewed and updated as indicated. Interim medical history since our last visit reviewed. Allergies and medications reviewed and updated. Outpatient Medications Prior to Visit  Medication Sig Dispense Refill  . acetaminophen (TYLENOL) 500 MG chewable tablet Chew 500 mg by mouth as needed for pain.    Marland Kitchen aspirin EC 81 MG tablet Take 1 tablet (81 mg total) by mouth daily.    Marland Kitchen lidocaine-prilocaine (EMLA) cream Apply to affected area once 30 g 3  . promethazine (PHENERGAN) 25 MG tablet TAKE 1 TABLET BY MOUTH EVERY 4 TO 6 HOURS AS NEEDED FOR NAUSEA 20 tablet 0  . ondansetron (ZOFRAN) 8 MG tablet Take 1 tablet (8 mg total) by mouth 2 (two) times daily as needed for refractory nausea / vomiting. Start on day 3 after chemotherapy. 30 tablet 1  . prochlorperazine (COMPAZINE) 10 MG tablet Take 1 tablet (10 mg total) by mouth every 6 (six) hours as needed (Nausea or vomiting). 30 tablet 1   No facility-administered medications prior to visit.      Per HPI unless specifically  indicated in ROS section below Review of Systems     Objective:    BP 118/68   Pulse 76   Temp 98.1 F (36.7 C)   Ht '5\' 5"'$  (1.651 m)   Wt 116 lb (52.6 kg)   BMI 19.30 kg/m   Wt Readings from Last 3 Encounters:  07/24/16 116 lb (52.6 kg)  07/23/16 115 lb 9.6 oz (52.4 kg)  07/09/16 116 lb (52.6 kg)    Physical Exam  Constitutional:  thin  Psychiatric: He has a normal mood and affect.  Nursing note and vitals reviewed.  Results for orders placed or performed in visit on 07/23/16  CBC with Differential  Result Value Ref Range   WBC 4.9 4.0 - 10.3 10e3/uL   NEUT# 1.8 1.5 - 6.5 10e3/uL   HGB 13.1 13.0 - 17.1 g/dL   HCT 38.3 (L) 38.4 - 49.9 %   Platelets 124 (L) 140 - 400 10e3/uL   MCV 94.7 79.3 - 98.0 fL   MCH 32.4 27.2 - 33.4 pg   MCHC 34.2 32.0 - 36.0 g/dL   RBC 4.05 (L) 4.20 - 5.82 10e6/uL   RDW 15.2 (H) 11.0 - 14.6 %   lymph# 2.5 0.9 - 3.3 10e3/uL   MONO# 0.5 0.1 - 0.9 10e3/uL   Eosinophils Absolute 0.1 0.0 - 0.5 10e3/uL   Basophils Absolute 0.0 0.0 -  0.1 10e3/uL   NEUT% 37.0 (L) 39.0 - 75.0 %   LYMPH% 50.4 (H) 14.0 - 49.0 %   MONO% 10.5 0.0 - 14.0 %   EOS% 1.2 0.0 - 7.0 %   BASO% 0.9 0.0 - 2.0 %  Comprehensive metabolic panel  Result Value Ref Range   Sodium 141 136 - 145 mEq/L   Potassium 4.2 3.5 - 5.1 mEq/L   Chloride 107 98 - 109 mEq/L   CO2 27 22 - 29 mEq/L   Glucose 115 70 - 140 mg/dl   BUN 11.2 7.0 - 26.0 mg/dL   Creatinine 0.9 0.7 - 1.3 mg/dL   Total Bilirubin 0.23 0.20 - 1.20 mg/dL   Alkaline Phosphatase 98 40 - 150 U/L   AST 19 5 - 34 U/L   ALT 21 0 - 55 U/L   Total Protein 6.5 6.4 - 8.3 g/dL   Albumin 3.5 3.5 - 5.0 g/dL   Calcium 9.2 8.4 - 10.4 mg/dL   Anion Gap 7 3 - 11 mEq/L   EGFR 90 (L) >90 ml/min/1.73 m2      Assessment & Plan:   Problem List Items Addressed This Visit    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 Currently on FOLFOX infusion Q2 wks. Appreciate onc care.        Insomnia - Primary    Chronic sleep initiation insomnia worse since cancer diagnosis. Melatonin ineffective. Discussed sleep hygiene measures. Will trial trazodone 1/2-1 tab nightly. Pt will update me in 1-2 wks with effect.           Follow up plan: No Follow-up on file.  Ria Bush, MD

## 2016-07-24 NOTE — Patient Instructions (Addendum)
Try trazodone 1/2-1 tablet at night time for insomnia.  Update me with effect in 1-2 weeks.   Sleep hygiene checklist: 1. Avoid naps during the day 2. Avoid stimulants such as caffeine and nicotine. Avoid bedtime alcohol (it can speed onset of sleep but the body's metabolism can cause awakenings). 3. All forms of exercise help ensure sound sleep - limit vigorous exercise to morning or late afternoon 4. Avoid food too close to bedtime including chocolate (which contains caffeine) 5. Soak up natural light 6. Establish regular bedtime routine. 7. Associate bed with sleep - avoid TV, computer or phone, reading while in bed. 8. Ensure pleasant, relaxing sleep environment - quiet, dark, cool room.

## 2016-07-25 ENCOUNTER — Ambulatory Visit (HOSPITAL_BASED_OUTPATIENT_CLINIC_OR_DEPARTMENT_OTHER): Payer: Medicare Other

## 2016-07-25 VITALS — BP 119/67 | HR 67 | Temp 98.0°F | Resp 18

## 2016-07-25 DIAGNOSIS — Z452 Encounter for adjustment and management of vascular access device: Secondary | ICD-10-CM | POA: Diagnosis not present

## 2016-07-25 DIAGNOSIS — C772 Secondary and unspecified malignant neoplasm of intra-abdominal lymph nodes: Secondary | ICD-10-CM

## 2016-07-25 DIAGNOSIS — C187 Malignant neoplasm of sigmoid colon: Secondary | ICD-10-CM

## 2016-07-25 MED ORDER — HEPARIN SOD (PORK) LOCK FLUSH 100 UNIT/ML IV SOLN
500.0000 [IU] | Freq: Once | INTRAVENOUS | Status: DC | PRN
  Filled 2016-07-25: qty 5

## 2016-07-25 MED ORDER — SODIUM CHLORIDE 0.9% FLUSH
10.0000 mL | INTRAVENOUS | Status: DC | PRN
  Filled 2016-07-25: qty 10

## 2016-07-27 NOTE — Addendum Note (Signed)
Addendum  created 07/27/16 1231 by Teighlor Korson, MD   Sign clinical note    

## 2016-07-27 NOTE — Anesthesia Postprocedure Evaluation (Signed)
Anesthesia Post Note  Patient: Mario Mcdowell  Procedure(s) Performed: Procedure(s) (LRB): LAPAROSCOPIC PARTIAL COLECTOMY, SPLENIC FLEXURE MOBILIZATION AND PROCTOSCOPY (N/A)     Anesthesia Post Evaluation  Last Vitals:  Vitals:   05/23/16 2054 05/24/16 0545  BP: (!) 143/67 120/76  Pulse: (!) 58 65  Resp: 16 16  Temp: 36.8 C 36.8 C    Last Pain:  Vitals:   05/24/16 0830  TempSrc:   PainSc: 2                  [name] EDWARD

## 2016-08-01 NOTE — Progress Notes (Signed)
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 Resected and retrieved. - Rule out malignancy, tumor in the sigmoid colon. Biopsied. Tattooed. - Internal hemorrhoids. - The examination was otherwise normal on direct and retroflexion views.       03/14/2016 Imaging    CT A/P W CONTRAST  IMPRESSION: Short segment area of wall thickening in the distal sigmoid colon, suspicious for site of primary colon carcinoma. Recommend correlation with colonoscopy results.      04/23/2016 Imaging    CT Chest W Contrast IMPRESSION: 1. No evidence of pulmonary metastasis. 2. Bilateral calcified pulmonary nodules and mediastinal lymph nodes consistent with granulomatous disease. 3. Centrilobular emphysema. 4. Coronary artery calcification and aortic atherosclerotic calcification.      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 Colon, segmental resection for tumor, sigmoid - INVASIVE ADENOCARCINOMA, WELL TO MODERATELY DIFFERENTIATED, WITH ABUNDANT MUCIN (PT3 PN1) -  METASTATIC ADENOCARCINOMA IN ONE OF TWENTY-TWO LYMPH NODES (1/22) - TWO TUMOR DEPOSITS PRESENT - MARGINS UNINVOLVED BY CARCINOMA - HYPERPLASTIC POLYPS (X2) - SEE ONCOLOGY TABLE BELOW 2. Colon, resection margin (donut), final distal margin - BENIGN COLON - NO MALIGNANCY IDENTIFIED      05/21/2016 Surgery    LAPAROSCOPIC PARTIAL COLECTOMY, SPLENIC FLEXURE MOBILIZATION AND PROCTOSCOPY      06/25/2016 -  Chemotherapy    FOLFOX every 2 weeks for 3 months        HISTORY OF PRESENTING ILLNESS:  Mario Mcdowell 66 y.o. male is here because of recently diagnosed colorectalcancer. He has reported to Dr. [name] on 01/19/76 for a evaluation of a rectosigmoid colon mass. 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 He denied any change in his bowel movement or habits of rectal bleeding.   He had a screening colonoscopy by Dr. Fuller Plan 03/12/2016. Who referred him to [name]. He denies any symptoms before. Before surgery, no changes in weight loss or change in energy level  Since the surgery, his appetite has been good and his energy level is fine.No pain reported from the incision. He takes hydrocodone for his pain.  August 08, 2015 he was admitted to the hospital for right lower quadrant abdominal pain due to a kidney stone.  He presents to the clinic today with his wife for consult of his stage 3 colon cancer and chemotherapy. The patient's wife says he doesn't go to the doctor often. He is complaint and wants to do whatever he can to get treated. He is worried about what he can eat when he can start chemo.  CURRENT THERAPY: adjuvant FOLFOX every 2 weeks,  starting 06/25/2016, plan for 6 cycles   INTERVAL HISTORY:  Mario Mcdowell returns for follow up and FOLFOX treatment. He presents to the clinic today reporting her did get sick with nausea form last chemo treatment. The phenergan helped him. His PCP gave him a sleeping pill, trazodone. He takes it but he says it does not make him  drowsy. He may lay there for 2 hours before he can fall sleep.  He will get constipated and uses colase and dulcolax as needed.  He will get cold on his left fingers. This goes away.  He wanted clarification on how many treatments he needed.  He was told he needed to see surgeon to get stitches removed.  This past chemo treatment it took him longer to recover.    MEDICAL HISTORY:  Past Medical History:  Diagnosis Date  . Aorto-iliac atherosclerosis ([place])    by CT; in bilateral legs   . Arthritis   . Atherosclerotic PVD with intermittent claudication ([place])   . BPH without obstruction/lower urinary tract symptoms 10/25/2015  . Centrilobular emphysema ([place])    per chest CT 04-23-2016  . Colon adenocarcinoma [facility]) oncologist- dr Burr Medico   dx 05-21-2016 -- Stage IIIB (pT3,pN1,M0) , Invasive sigmoid adenocarcinoma well to moderately differentiated with metastatic intra-abdominal lymph node (1 out of 22), s/p lap. partial colectomy [name]) 05/21/2016 and chemotherapy is planned  . Full dentures   . History of kidney stones 07/2015   passed without intervention  . Pulmonary nodules    per chest CT 04-23-2016 bilateral nodules (consistent w/ granulomatous disease)  . Smokers' cough ([place])   . Wears contact lenses     SURGICAL HISTORY: Past Surgical History:  Procedure Laterality Date  . COLONOSCOPY  03/12/2016   4 polyps, sigmoid tumor biopsied (TVA w/ MICROSCOPIC FOCUS SUSPICIOUS FOR INVASION) pending surgical resection, rpt 1 yr Fuller Plan)  . LAPAROSCOPIC PARTIAL COLECTOMY N/A 05/21/2016   colon cancer, 1/20 positive lymp nodes. LAPAROSCOPIC PARTIAL COLECTOMY, SPLENIC FLEXURE MOBILIZATION AND PROCTOSCOPY;  [name], MD)  . MULTIPLE TOOTH EXTRACTIONS  1980's  . PORTACATH PLACEMENT N/A 06/21/2016   Procedure: INSERTION PORT-A-CATH;  Surgeon: [name], MD;  Location: [facility];  Service: General;  Laterality: N/A;  right    SOCIAL HISTORY: Social History   Social  History  . Marital status: Married    Spouse name: N/A  . Number of children: N/A  . Years of education: N/A   Occupational History  . Not on file.   Social History Main Topics  . Smoking status: Current Every Day Smoker    Packs/day: 0.25    Years: 46.00    Types: Cigarettes  . Smokeless tobacco: Never Used     Comment: PT TRYING TO QUIT DOWN TO 1PP3 DAYS FROM 1.5PPD  . Alcohol use No     Comment: quit 2000  . Drug use: No  . Sexual activity: No   Other Topics Concern  . Not on file   Social History Narrative   Lives with wife, adopted daughter, adopted son   Occ: Games developer   Edu: 8th grade     FAMILY HISTORY: Family History  Problem Relation Age of Onset  . Cancer Paternal Aunt        unsure details  . Diabetes Cousin   . Cancer Paternal Uncle        unknow type cancer   . Prostate cancer Neg Hx   . Kidney cancer Neg Hx   . CAD Neg Hx   .  Stroke Neg Hx   . Colon cancer Neg Hx     ALLERGIES:  is allergic to penicillins and lipitor [atorvastatin].  MEDICATIONS:  Current Outpatient Prescriptions  Medication Sig Dispense Refill  . acetaminophen (TYLENOL) 500 MG chewable tablet Chew 500 mg by mouth as needed for pain.    Marland Kitchen aspirin EC 81 MG tablet Take 1 tablet (81 mg total) by mouth daily.    Marland Kitchen lidocaine-prilocaine (EMLA) cream Apply to affected area once 30 g 3  . traZODone (DESYREL) 50 MG tablet Take 0.5-1 tablets (25-50 mg total) by mouth at bedtime as needed for sleep. 30 tablet 3  . promethazine (PHENERGAN) 25 MG tablet TAKE 1 TABLET BY MOUTH EVERY 4 TO 6 HOURS AS NEEDED FOR NAUSEA (Patient not taking: Reported on 08/06/2016) 20 tablet 0   No current facility-administered medications for this visit.     REVIEW OF SYSTEMS:  Constitutional: Denies fevers, chills or abnormal night sweats Eyes: Denies blurriness of vision, double vision or watery eyes Ears, nose, mouth, throat, and face: Denies mucositis or sore throat Respiratory: Denies cough, dyspnea or  wheezes Cardiovascular: Denies palpitation, chest discomfort or lower extremity swelling Gastrointestinal:  Denies heartburn or change in bowel habits (+) nausea  Skin: Denies abnormal skin rashes Lymphatics: Denies new lymphadenopathy or easy bruising  Neurological:Denies numbness, tingling or new weaknesses (+) cold sensation on left fingers Behavioral/Psych: Mood is stable, no new changes  All other systems were reviewed with the patient and are negative.  PHYSICAL EXAMINATION:  ECOG PERFORMANCE STATUS: 1 - Symptomatic but completely ambulatory  Vitals:   08/06/16 1019  BP: (!) 164/76  Pulse: 64  Resp: 18  Temp: 97.7 F (36.5 C)   Filed Weights   08/06/16 1019  Weight: 117 lb 9.6 oz (53.3 kg)    GENERAL:alert, no distress and comfortable SKIN: skin color, texture, turgor are normal, no rashes or significant lesions EYES: normal, conjunctiva are pink and non-injected, sclera clear OROPHARYNX:no exudate, no erythema and lips, buccal mucosa, and tongue normal  NECK: supple, thyroid normal size, non-tender, without nodularity LYMPH:  no palpable lymphadenopathy in the cervical, axillary or inguinal LUNGS: clear to auscultation and percussion with normal breathing effort HEART: regular rate & rhythm and no murmurs and no lower extremity edema ABDOMEN:abdomen soft, non-tender and normal bowel sounds, surgical incisions are well healed  Musculoskeletal:no cyanosis of digits and no clubbing  PSYCH: alert & oriented x 3 with fluent speech NEURO: no focal motor/sensory deficits  LABORATORY DATA:  I have reviewed the data as listed CBC Latest Ref Rng & Units 08/06/2016 07/23/2016 07/09/2016  WBC 4.0 - 10.3 10e3/uL 5.7 4.9 5.5  Hemoglobin 13.0 - 17.1 g/dL 12.9(L) 13.1 13.8  Hematocrit 38.4 - 49.9 % 37.9(L) 38.3(L) 40.1  Platelets 140 - 400 10e3/uL 117(L) 124(L) 171   CMP Latest Ref Rng & Units 08/06/2016 07/23/2016 07/09/2016  Glucose 70 - 140 mg/dl 149(H) 115 127  BUN 7.0 - 26.0 mg/dL  11.9 11.2 9.4  Creatinine 0.7 - 1.3 mg/dL 0.9 0.9 0.8  Sodium 136 - 145 mEq/L 141 141 141  Potassium 3.5 - 5.1 mEq/L 4.0 4.2 3.9  Chloride 101 - 111 mmol/L - - -  CO2 22 - 29 mEq/L '23 27 25  '$ Calcium 8.4 - 10.4 mg/dL 9.1 9.2 9.1  Total Protein 6.4 - 8.3 g/dL 6.2(L) 6.5 6.6  Total Bilirubin 0.20 - 1.20 mg/dL 0.23 0.23 0.25  Alkaline Phos 40 - 150 U/L 98 98 84  AST 5 - 34  U/L '18 19 23  '$ ALT 0 - 55 U/L '20 21 27    '$ Pathology report  Diagnosis 05/21/16 1. Colon, segmental resection for tumor, sigmoid - INVASIVE ADENOCARCINOMA, WELL TO MODERATELY DIFFERENTIATED, WITH ABUNDANT MUCIN (PT3 PN1) - METASTATIC ADENOCARCINOMA IN ONE OF TWENTY-TWO LYMPH NODES (1/22) - TWO TUMOR DEPOSITS PRESENT - MARGINS UNINVOLVED BY CARCINOMA - HYPERPLASTIC POLYPS (X2) - SEE ONCOLOGY TABLE BELOW 2. Colon, resection margin (donut), final distal margin - BENIGN COLON - NO MALIGNANCY IDENTIFIED Microscopic Comment 1. ONCOLOGY TABLE: COLON Specimen: Sigmoid colon Procedure: Sigmoidectomy Tumor site: Sigmoid Macroscopic tumor perforation: Not identified Invasive tumor: 3.4 x 2.5 x 0.6 cm Histologic type(s): Adenocarcinoma with abundant mucin Histologic grade and differentiation: G1- G2 Microscopic extension of invasive tumor: Tumor invades muscularis propria Lymph-Vascular invasion: Not identified Perineural invasion: Not identified Treatment Effect: No known presurgical therapy Tumor deposit(s): Present, 2 Resection margins: All margins are uninvolved by invasive carcinoma, high-grade dysplasia, intramucosal adenocarcinoma, and adenoma Margins examined: Proximal, distal and Radial Distance closest margin (if all above margins negative): 3.6 cm (distal) Additional polyp(s): Hyperplastic polyps (x2) Lymph nodes: number examined 22; number positive: 1 1 of 5 Supplemental copy SUPPLEMENTAL for MAYCOL, HOYING [phone]) Microscopic Comment(continued) Pathologic Staging: pT3, pN1 Ancillary studies: performed  on block 1G (MMR by immunohistochemistry and MSI by PCR; will be reported separately.) Dr. Lyndon Code has reviewed the pertinent slide(1E) and agrees that adenocarcinoma is present in this sample.  RADIOGRAPHIC STUDIES: I have personally reviewed the radiological images as listed and agreed with the findings in the report. No results found.   CT CHEST W CONTRAST 04/23/16 IMPRESSION: 1. No evidence of pulmonary metastasis. 2. Bilateral calcified pulmonary nodules and mediastinal lymph nodes consistent with granulomatous disease. 3. Centrilobular emphysema. 4. Coronary artery calcification and aortic atherosclerotic calcification.  CT A/P W CONTRAST 03/14/16 IMPRESSION: Short segment area of wall thickening in the distal sigmoid colon, suspicious for site of primary colon carcinoma. Recommend correlation with colonoscopy results.  No evidence of local or distant metastatic disease. No evidence of bowel obstruction.  PROCEDURES Colonoscopy 03/12/2016 Four 6 to 8 mm polyps in the rectum and in the transverse colon, removed with a cold snare. Resected and retrieved. - Rule out malignancy, tumor in the sigmoid colon. Biopsied. Tattooed. - Internal hemorrhoids. - The examination was otherwise normal on direct and retroflexion views.   ASSESSMENT & PLAN:  66 y.o.  male  1. 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 -We previously discussed the risk of cancer recurrence after surgery. Due to his stage IIIB disease, he is at high risk for recurrence -We previously discussed the standard adjuvant chemotherapy for stage III colon cancer,  reduce the risk of cancer recurrence. -We previously discussed the regimens for adjuvant chemotherapy, including FOLFOX, CAPOX, or single agent  Xeloda or 5-FU. He is 59, but does not have many medical comorbidities and recovered from surgery quickly. I recommended him to consider FOLFOX -We previously discussed the duration of adjuvant chemotherapy. Based on the recent reported IDEA study in ASCO 2017, I recommended 3 months therapy.  -He has started adjuvant chemotherapy, tolerating well overall, mild nausea, no neuropathy so far. -Lab reviewed, adequate for treatment, he has mild thrombocytopenia, I'll omit 5-FU push from this cycle  He uses Phenergan as it works best for him. -Continue adjuvant chemotherapy  FOLFOX every 2 weeks, he has 2 more treatments.   2. Smoking cessation  - I strongly encouraged him to work towards quitting. I recommended Nicotine patches or counseling to help him quit.  3. Emphysema  4. BPH  5. History of kidney stones  PLAN -Lab reviewed, adequate for treatment, we'll proceed to cycle 4 FOLFOX today, hold 5-FU push due to low platelet counts.  -Lab, flush, f/u (me or APP), and chemo FOLFOX in 2 and 4 weeks   No orders of the defined types were placed in this encounter.   All questions were answered. The patient knows to call the clinic with any problems, questions or concerns.  I spent 20 minutes counseling the patient face to face. The total time spent in the appointment was 25 minutes and more than 50% was on counseling.     Truitt Merle, MD 08/06/2016   This document serves as a record of services personally performed by Truitt Merle, MD. It was created on her behalf by Joslyn Devon, a trained medical scribe. The creation of this record is based on the scribe's personal observations and the provider's statements to them. This document has been checked and approved by the attending provider.

## 2016-08-06 ENCOUNTER — Ambulatory Visit: Payer: Medicare Other

## 2016-08-06 ENCOUNTER — Other Ambulatory Visit (HOSPITAL_BASED_OUTPATIENT_CLINIC_OR_DEPARTMENT_OTHER): Payer: Medicare Other

## 2016-08-06 ENCOUNTER — Ambulatory Visit (HOSPITAL_BASED_OUTPATIENT_CLINIC_OR_DEPARTMENT_OTHER): Payer: Medicare Other | Admitting: Hematology

## 2016-08-06 ENCOUNTER — Telehealth: Payer: Self-pay | Admitting: Hematology

## 2016-08-06 ENCOUNTER — Ambulatory Visit (HOSPITAL_BASED_OUTPATIENT_CLINIC_OR_DEPARTMENT_OTHER): Payer: Medicare Other

## 2016-08-06 ENCOUNTER — Encounter: Payer: Self-pay | Admitting: Hematology

## 2016-08-06 VITALS — BP 164/76 | HR 64 | Temp 97.7°F | Resp 18 | Ht 65.0 in | Wt 117.6 lb

## 2016-08-06 DIAGNOSIS — C772 Secondary and unspecified malignant neoplasm of intra-abdominal lymph nodes: Secondary | ICD-10-CM

## 2016-08-06 DIAGNOSIS — C187 Malignant neoplasm of sigmoid colon: Secondary | ICD-10-CM

## 2016-08-06 DIAGNOSIS — F17200 Nicotine dependence, unspecified, uncomplicated: Secondary | ICD-10-CM | POA: Diagnosis not present

## 2016-08-06 DIAGNOSIS — Z5111 Encounter for antineoplastic chemotherapy: Secondary | ICD-10-CM | POA: Diagnosis not present

## 2016-08-06 DIAGNOSIS — Z95828 Presence of other vascular implants and grafts: Secondary | ICD-10-CM

## 2016-08-06 DIAGNOSIS — Z87442 Personal history of urinary calculi: Secondary | ICD-10-CM

## 2016-08-06 DIAGNOSIS — N400 Enlarged prostate without lower urinary tract symptoms: Secondary | ICD-10-CM

## 2016-08-06 DIAGNOSIS — J439 Emphysema, unspecified: Secondary | ICD-10-CM | POA: Diagnosis not present

## 2016-08-06 LAB — CBC WITH DIFFERENTIAL/PLATELET
BASO%: 0.9 % (ref 0.0–2.0)
Basophils Absolute: 0.1 10*3/uL (ref 0.0–0.1)
EOS%: 0.7 % (ref 0.0–7.0)
Eosinophils Absolute: 0 10*3/uL (ref 0.0–0.5)
HEMATOCRIT: 37.9 % — AB (ref 38.4–49.9)
HGB: 12.9 g/dL — ABNORMAL LOW (ref 13.0–17.1)
LYMPH#: 2.2 10*3/uL (ref 0.9–3.3)
LYMPH%: 38.1 % (ref 14.0–49.0)
MCH: 32.3 pg (ref 27.2–33.4)
MCHC: 34 g/dL (ref 32.0–36.0)
MCV: 94.8 fL (ref 79.3–98.0)
MONO#: 0.5 10*3/uL (ref 0.1–0.9)
MONO%: 9.4 % (ref 0.0–14.0)
NEUT%: 50.9 % (ref 39.0–75.0)
NEUTROS ABS: 2.9 10*3/uL (ref 1.5–6.5)
PLATELETS: 117 10*3/uL — AB (ref 140–400)
RBC: 4 10*6/uL — AB (ref 4.20–5.82)
RDW: 15.6 % — ABNORMAL HIGH (ref 11.0–14.6)
WBC: 5.7 10*3/uL (ref 4.0–10.3)

## 2016-08-06 LAB — COMPREHENSIVE METABOLIC PANEL
ALT: 20 U/L (ref 0–55)
ANION GAP: 8 meq/L (ref 3–11)
AST: 18 U/L (ref 5–34)
Albumin: 3.5 g/dL (ref 3.5–5.0)
Alkaline Phosphatase: 98 U/L (ref 40–150)
BILIRUBIN TOTAL: 0.23 mg/dL (ref 0.20–1.20)
BUN: 11.9 mg/dL (ref 7.0–26.0)
CALCIUM: 9.1 mg/dL (ref 8.4–10.4)
CO2: 23 meq/L (ref 22–29)
CREATININE: 0.9 mg/dL (ref 0.7–1.3)
Chloride: 109 mEq/L (ref 98–109)
EGFR: 90 mL/min/{1.73_m2} — ABNORMAL LOW (ref >90)
Glucose: 149 mg/dl — ABNORMAL HIGH (ref 70–140)
Potassium: 4 mEq/L (ref 3.5–5.1)
Sodium: 141 mEq/L (ref 136–145)
TOTAL PROTEIN: 6.2 g/dL — AB (ref 6.4–8.3)

## 2016-08-06 MED ORDER — SODIUM CHLORIDE 0.9 % IV SOLN
Freq: Once | INTRAVENOUS | Status: AC
  Administered 2016-08-06: 12:00:00 via INTRAVENOUS
  Filled 2016-08-06: qty 5

## 2016-08-06 MED ORDER — SODIUM CHLORIDE 0.9% FLUSH
10.0000 mL | Freq: Once | INTRAVENOUS | Status: AC
  Administered 2016-08-06: 10 mL
  Filled 2016-08-06: qty 10

## 2016-08-06 MED ORDER — PALONOSETRON HCL INJECTION 0.25 MG/5ML
0.2500 mg | Freq: Once | INTRAVENOUS | Status: AC
  Administered 2016-08-06: 0.25 mg via INTRAVENOUS

## 2016-08-06 MED ORDER — SODIUM CHLORIDE 0.9 % IV SOLN
2400.0000 mg/m2 | INTRAVENOUS | Status: DC
  Administered 2016-08-06: 3700 mg via INTRAVENOUS
  Filled 2016-08-06: qty 74

## 2016-08-06 MED ORDER — DEXTROSE 5 % IV SOLN
Freq: Once | INTRAVENOUS | Status: AC
  Administered 2016-08-06: 11:00:00 via INTRAVENOUS

## 2016-08-06 MED ORDER — PALONOSETRON HCL INJECTION 0.25 MG/5ML
INTRAVENOUS | Status: AC
  Filled 2016-08-06: qty 5

## 2016-08-06 MED ORDER — LEUCOVORIN CALCIUM INJECTION 350 MG
400.0000 mg/m2 | Freq: Once | INTRAMUSCULAR | Status: AC
  Administered 2016-08-06: 620 mg via INTRAVENOUS
  Filled 2016-08-06: qty 31

## 2016-08-06 MED ORDER — DEXTROSE 5 % IV SOLN
85.0000 mg/m2 | Freq: Once | INTRAVENOUS | Status: AC
  Administered 2016-08-06: 130 mg via INTRAVENOUS
  Filled 2016-08-06: qty 6

## 2016-08-06 NOTE — Patient Instructions (Signed)
[facility] Discharge Instructions for Patients Receiving Chemotherapy  Today you received the following chemotherapy agents: Oxaliplatin, Leucovorin and Adrucil.   To help prevent nausea and vomiting after your treatment, we encourage you to take your nausea medication as directed.  If you develop nausea and vomiting that is not controlled by your nausea medication, call the clinic.   BELOW ARE SYMPTOMS THAT SHOULD BE REPORTED IMMEDIATELY:  *FEVER GREATER THAN 100.5 F  *CHILLS WITH OR WITHOUT FEVER  NAUSEA AND VOMITING THAT IS NOT CONTROLLED WITH YOUR NAUSEA MEDICATION  *UNUSUAL SHORTNESS OF BREATH  *UNUSUAL BRUISING OR BLEEDING  TENDERNESS IN MOUTH AND THROAT WITH OR WITHOUT PRESENCE OF ULCERS  *URINARY PROBLEMS  *BOWEL PROBLEMS  UNUSUAL RASH Items with * indicate a potential emergency and should be followed up as soon as possible.  Feel free to call the clinic you have any questions or concerns. The clinic phone number is [phone].  Please show the CHEMO ALERT CARD at check-in to the Emergency Department and triage nurse.   

## 2016-08-06 NOTE — Telephone Encounter (Signed)
Scheduled appt per 7/23 los - patient to get new schedule inh the treatment area.

## 2016-08-08 ENCOUNTER — Ambulatory Visit (HOSPITAL_BASED_OUTPATIENT_CLINIC_OR_DEPARTMENT_OTHER): Payer: Medicare Other

## 2016-08-08 VITALS — BP 117/67 | HR 68 | Temp 98.8°F | Resp 17

## 2016-08-08 DIAGNOSIS — C187 Malignant neoplasm of sigmoid colon: Secondary | ICD-10-CM

## 2016-08-08 DIAGNOSIS — Z452 Encounter for adjustment and management of vascular access device: Secondary | ICD-10-CM | POA: Diagnosis not present

## 2016-08-08 DIAGNOSIS — C772 Secondary and unspecified malignant neoplasm of intra-abdominal lymph nodes: Secondary | ICD-10-CM | POA: Diagnosis not present

## 2016-08-08 DIAGNOSIS — Z95828 Presence of other vascular implants and grafts: Secondary | ICD-10-CM

## 2016-08-08 MED ORDER — SODIUM CHLORIDE 0.9% FLUSH
10.0000 mL | Freq: Once | INTRAVENOUS | Status: AC
  Administered 2016-08-08: 10 mL
  Filled 2016-08-08: qty 10

## 2016-08-08 MED ORDER — HEPARIN SOD (PORK) LOCK FLUSH 100 UNIT/ML IV SOLN
500.0000 [IU] | Freq: Once | INTRAVENOUS | Status: AC
  Administered 2016-08-08: 500 [IU]
  Filled 2016-08-08: qty 5

## 2016-08-17 NOTE — Progress Notes (Signed)
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 Resected and retrieved. - Rule out malignancy, tumor in the sigmoid colon. Biopsied. Tattooed. - Internal hemorrhoids. - The examination was otherwise normal on direct and retroflexion views.       03/14/2016 Imaging    CT A/P W CONTRAST  IMPRESSION: Short segment area of wall thickening in the distal sigmoid colon, suspicious for site of primary colon carcinoma. Recommend correlation with colonoscopy results.      04/23/2016 Imaging    CT Chest W Contrast IMPRESSION: 1. No evidence of pulmonary metastasis. 2. Bilateral calcified pulmonary nodules and mediastinal lymph nodes consistent with granulomatous disease. 3. Centrilobular emphysema. 4. Coronary artery calcification and aortic atherosclerotic calcification.      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 Colon, segmental resection for tumor, sigmoid - INVASIVE ADENOCARCINOMA, WELL TO MODERATELY DIFFERENTIATED, WITH ABUNDANT MUCIN (PT3 PN1) -  METASTATIC ADENOCARCINOMA IN ONE OF TWENTY-TWO LYMPH NODES (1/22) - TWO TUMOR DEPOSITS PRESENT - MARGINS UNINVOLVED BY CARCINOMA - HYPERPLASTIC POLYPS (X2) - SEE ONCOLOGY TABLE BELOW 2. Colon, resection margin (donut), final distal margin - BENIGN COLON - NO MALIGNANCY IDENTIFIED      05/21/2016 Surgery    LAPAROSCOPIC PARTIAL COLECTOMY, SPLENIC FLEXURE MOBILIZATION AND PROCTOSCOPY      06/25/2016 -  Chemotherapy    FOLFOX every 2 weeks for 3 months        HISTORY OF PRESENTING ILLNESS:  Mario Mcdowell 66 y.o. male is here because of recently diagnosed colorectalcancer. He has reported to Dr. [name] on 09/19/15 for a evaluation of a rectosigmoid colon mass. 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 He denied any change in his bowel movement or habits of rectal bleeding.   He had a screening colonoscopy by Dr. Fuller Plan 03/12/2016. Who referred him to [name]. He denies any symptoms before. Before surgery, no changes in weight loss or change in energy level  Since the surgery, his appetite has been good and his energy level is fine.No pain reported from the incision. He takes hydrocodone for his pain.  August 08, 2015 he was admitted to the hospital for right lower quadrant abdominal pain due to a kidney stone.  He presents to the clinic today with his wife for consult of his stage 3 colon cancer and chemotherapy. The patient's wife says he doesn't go to the doctor often. He is complaint and wants to do whatever he can to get treated. He is worried about what he can eat when he can start chemo.  CURRENT THERAPY: adjuvant FOLFOX every 2 weeks,  starting 06/25/2016, plan for 6 cycles   INTERVAL HISTORY:  Wildon Cuevas returns for follow up and cycle 5 FOLFOX treatment. He has been doing well. Post treatment, he has some nausea and vomiting. He denies any neuropathy. He has not quit smoking but he has cut back, smoking half of what he usually smokes. He has had some  numbness in his left foot and back of leg. Doctors have told him he had hardened of organs.   MEDICAL HISTORY:  Past Medical History:  Diagnosis Date  . Aorto-iliac atherosclerosis ([place])    by CT; in bilateral legs   . Arthritis   . Atherosclerotic PVD with intermittent claudication ([place])   . BPH without obstruction/lower urinary tract symptoms 10/25/2015  . Centrilobular emphysema ([place])    per chest CT 04-23-2016  . Colon adenocarcinoma [facility]) oncologist- dr Burr Medico   dx 05-21-2016 -- Stage IIIB (pT3,pN1,M0) , Invasive sigmoid adenocarcinoma well to moderately differentiated with metastatic intra-abdominal lymph node (1 out of 22), s/p lap. partial colectomy [name]) 05/21/2016 and chemotherapy is planned  . Full dentures   . History of kidney stones 07/2015   passed without intervention  . Pulmonary nodules    per chest CT 04-23-2016 bilateral nodules (consistent w/ granulomatous disease)  . Smokers' cough ([place])   . Wears contact lenses     SURGICAL HISTORY: Past Surgical History:  Procedure Laterality Date  . COLONOSCOPY  03/12/2016   4 polyps, sigmoid tumor biopsied (TVA w/ MICROSCOPIC FOCUS SUSPICIOUS FOR INVASION) pending surgical resection, rpt 1 yr Fuller Plan)  . LAPAROSCOPIC PARTIAL COLECTOMY N/A 05/21/2016   colon cancer, 1/20 positive lymp nodes. LAPAROSCOPIC PARTIAL COLECTOMY, SPLENIC FLEXURE MOBILIZATION AND PROCTOSCOPY;  [name], MD)  . MULTIPLE TOOTH EXTRACTIONS  1980's  . PORTACATH PLACEMENT N/A 06/21/2016   Procedure: INSERTION PORT-A-CATH;  Surgeon: [name], MD;  Location: [facility];  Service: General;  Laterality: N/A;  right    SOCIAL HISTORY: Social History   Social History  . Marital status: Married    Spouse name: N/A  . Number of children: N/A  . Years of education: N/A   Occupational History  . Not on file.   Social History Main Topics  . Smoking status: Current Every Day Smoker    Packs/day: 0.25    Years: 46.00     Types: Cigarettes  . Smokeless tobacco: Never Used     Comment: PT TRYING TO QUIT DOWN TO 1PP3 DAYS FROM 1.5PPD  . Alcohol use No     Comment: quit 2000  . Drug use: No  . Sexual activity: No   Other Topics Concern  . Not on file   Social History Narrative   Lives with wife, adopted daughter, adopted son   Occ: Games developer   Edu: 8th grade     FAMILY HISTORY: Family History  Problem Relation Age of Onset  . Cancer Paternal Aunt        unsure details  . Diabetes Cousin   . Cancer Paternal Uncle        unknow type cancer   . Prostate cancer Neg Hx   . Kidney cancer Neg Hx   . CAD Neg Hx   . Stroke Neg Hx   . Colon cancer Neg Hx     ALLERGIES:  is allergic to penicillins and lipitor [atorvastatin].  MEDICATIONS:  Current Outpatient Prescriptions  Medication Sig Dispense Refill  . acetaminophen (TYLENOL) 500 MG chewable tablet Chew 500 mg by mouth as needed for pain.    Marland Kitchen  aspirin EC 81 MG tablet Take 1 tablet (81 mg total) by mouth daily.    Marland Kitchen lidocaine-prilocaine (EMLA) cream Apply to affected area once 30 g 3  . promethazine (PHENERGAN) 25 MG tablet TAKE 1 TABLET BY MOUTH EVERY 4 TO 6 HOURS AS NEEDED FOR NAUSEA 20 tablet 0  . traZODone (DESYREL) 50 MG tablet Take 0.5-1 tablets (25-50 mg total) by mouth at bedtime as needed for sleep. (Patient not taking: Reported on 08/20/2016) 30 tablet 3   No current facility-administered medications for this visit.    Facility-Administered Medications Ordered in Other Visits  Medication Dose Route Frequency Provider Last Rate Last Dose  . fluorouracil (ADRUCIL) 3,700 mg in sodium chloride 0.9 % 76 mL chemo infusion  2,400 mg/m2 (Treatment Plan Recorded) Intravenous 1 day or 1 dose Truitt Merle, MD   3,700 mg at 08/20/16 1804  . sodium chloride flush (NS) 0.9 % injection 10 mL  10 mL Intracatheter PRN Truitt Merle, MD        REVIEW OF SYSTEMS:  Constitutional: Denies fevers, chills or abnormal night sweats Eyes: Denies blurriness of vision,  double vision or watery eyes Ears, nose, mouth, throat, and face: Denies mucositis or sore throat Respiratory: Denies cough, dyspnea or wheezes Cardiovascular: Denies palpitation, chest discomfort or lower extremity swelling Gastrointestinal:  Denies heartburn or change in bowel habits (+) nausea  Skin: Denies abnormal skin rashes Lymphatics: Denies new lymphadenopathy or easy bruising  Neurological:Denies numbness, tingling or new weaknesses (+) occasional numbness of left foot Behavioral/Psych: Mood is stable, no new changes  All other systems were reviewed with the patient and are negative.  PHYSICAL EXAMINATION:  ECOG PERFORMANCE STATUS: 1 - Symptomatic but completely ambulatory  Vitals:   08/20/16 1413  BP: (!) 142/67  Pulse: 70  Resp: 18  Temp: 98.3 F (36.8 C)   Filed Weights   08/20/16 1413  Weight: 115 lb 9.6 oz (52.4 kg)    GENERAL:alert, no distress and comfortable SKIN: skin color, texture, turgor are normal, no rashes or significant lesions EYES: normal, conjunctiva are pink and non-injected, sclera clear OROPHARYNX:no exudate, no erythema and lips, buccal mucosa, and tongue normal  NECK: supple, thyroid normal size, non-tender, without nodularity LYMPH:  no palpable lymphadenopathy in the cervical, axillary or inguinal LUNGS: clear to auscultation and percussion with normal breathing effort HEART: regular rate & rhythm and no murmurs and no lower extremity edema ABDOMEN:abdomen soft, non-tender and normal bowel sounds, surgical incisions are well healed  Musculoskeletal:no cyanosis of digits and no clubbing  PSYCH: alert & oriented x 3 with fluent speech NEURO: no focal motor/sensory deficits  LABORATORY DATA:  I have reviewed the data as listed CBC Latest Ref Rng & Units 08/20/2016 08/06/2016 07/23/2016  WBC 4.0 - 10.3 10e3/uL 6.9 5.7 4.9  Hemoglobin 13.0 - 17.1 g/dL 13.2 12.9(L) 13.1  Hematocrit 38.4 - 49.9 % 38.3(L) 37.9(L) 38.3(L)  Platelets 140 - 400  10e3/uL 143 117(L) 124(L)   CMP Latest Ref Rng & Units 08/20/2016 08/06/2016 07/23/2016  Glucose 70 - 140 mg/dl 132 149(H) 115  BUN 7.0 - 26.0 mg/dL 17.0 11.9 11.2  Creatinine 0.7 - 1.3 mg/dL 0.8 0.9 0.9  Sodium 136 - 145 mEq/L 140 141 141  Potassium 3.5 - 5.1 mEq/L 4.1 4.0 4.2  Chloride 101 - 111 mmol/L - - -  CO2 22 - 29 mEq/L '25 23 27  '$ Calcium 8.4 - 10.4 mg/dL 9.5 9.1 9.2  Total Protein 6.4 - 8.3 g/dL 6.9 6.2(L) 6.5  Total  Bilirubin 0.20 - 1.20 mg/dL 0.22 0.23 0.23  Alkaline Phos 40 - 150 U/L 98 98 98  AST 5 - 34 U/L '19 18 19  '$ ALT 0 - 55 U/L '20 20 21    '$ Pathology report  Diagnosis 05/21/16 1. Colon, segmental resection for tumor, sigmoid - INVASIVE ADENOCARCINOMA, WELL TO MODERATELY DIFFERENTIATED, WITH ABUNDANT MUCIN (PT3 PN1) - METASTATIC ADENOCARCINOMA IN ONE OF TWENTY-TWO LYMPH NODES (1/22) - TWO TUMOR DEPOSITS PRESENT - MARGINS UNINVOLVED BY CARCINOMA - HYPERPLASTIC POLYPS (X2) - SEE ONCOLOGY TABLE BELOW 2. Colon, resection margin (donut), final distal margin - BENIGN COLON - NO MALIGNANCY IDENTIFIED Microscopic Comment 1. ONCOLOGY TABLE: COLON Specimen: Sigmoid colon Procedure: Sigmoidectomy Tumor site: Sigmoid Macroscopic tumor perforation: Not identified Invasive tumor: 3.4 x 2.5 x 0.6 cm Histologic type(s): Adenocarcinoma with abundant mucin Histologic grade and differentiation: G1- G2 Microscopic extension of invasive tumor: Tumor invades muscularis propria Lymph-Vascular invasion: Not identified Perineural invasion: Not identified Treatment Effect: No known presurgical therapy Tumor deposit(s): Present, 2 Resection margins: All margins are uninvolved by invasive carcinoma, high-grade dysplasia, intramucosal adenocarcinoma, and adenoma Margins examined: Proximal, distal and Radial Distance closest margin (if all above margins negative): 3.6 cm (distal) Additional polyp(s): Hyperplastic polyps (x2) Lymph nodes: number examined 22; number positive: 1 1 of  5 Supplemental copy SUPPLEMENTAL for FRANCOIS, ELK [id number]) Microscopic Comment(continued) Pathologic Staging: pT3, pN1 Ancillary studies: performed on block 1G (MMR by immunohistochemistry and MSI by PCR; will be reported separately.) Dr. Lyndon Code has reviewed the pertinent slide(1E) and agrees that adenocarcinoma is present in this sample.  RADIOGRAPHIC STUDIES: I have personally reviewed the radiological images as listed and agreed with the findings in the report. No results found.   CT CHEST W CONTRAST 04/23/16 IMPRESSION: 1. No evidence of pulmonary metastasis. 2. Bilateral calcified pulmonary nodules and mediastinal lymph nodes consistent with granulomatous disease. 3. Centrilobular emphysema. 4. Coronary artery calcification and aortic atherosclerotic calcification.  CT A/P W CONTRAST 03/14/16 IMPRESSION: Short segment area of wall thickening in the distal sigmoid colon, suspicious for site of primary colon carcinoma. Recommend correlation with colonoscopy results.  No evidence of local or distant metastatic disease. No evidence of bowel obstruction.  PROCEDURES Colonoscopy 03/12/2016 Four 6 to 8 mm polyps in the rectum and in the transverse colon, removed with a cold snare. Resected and retrieved. - Rule out malignancy, tumor in the sigmoid colon. Biopsied. Tattooed. - Internal hemorrhoids. - The examination was otherwise normal on direct and retroflexion views.   ASSESSMENT & PLAN:  66 y.o.  male  1. 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 -We previously discussed the risk of cancer recurrence after surgery. Due to his stage IIIB disease, he is at high risk for recurrence -We previously discussed the standard adjuvant chemotherapy for stage III  colon cancer,  reduce the risk of cancer recurrence. -We previously discussed the regimens for adjuvant chemotherapy, including FOLFOX, CAPOX, or single agent Xeloda or 5-FU. He is 61, but does not have many medical comorbidities and recovered from surgery quickly. I recommended him to consider FOLFOX -We previously discussed the duration of adjuvant chemotherapy. Based on the recent reported IDEA study in ASCO 2017, I recommended 3 months therapy.  -He has started adjuvant chemotherapy, tolerating well overall, mild nausea, no neuropathy so far. -Lab reviewed, adequate for treatment, he  has mild thrombocytopenia, I previously omitted 5-FU push from his cycle 4 -Continue adjuvant chemotherapy FOLFOX every 2 weeks, he has 1 remaining treatment.  --I discussed the risk of cancer recurrence in the future. I discussed the surveillance plan, which is a physical exam and lab test (including CBC, CMP and CEA) every 3 months for the first 2 years, then every 6-12 months, colonoscopy in one year, and surveilliance CT scan every 6-12 month for up to 5 year.    2. Smoking cessation  - I strongly encouraged him to work towards quitting. I recommended Nicotine patches or counseling to help him quit. - I again strongly encouraged him to cut back his smoking  3. Emphysema  4. BPH  5. History of kidney stones  PLAN -Lab reviewed, adequate for treatment, we'll proceed to cycle 5 FOLFOX today, no 5-fu bolus  -Lab, flush, f/u (me or APP), and chemo FOLFOX in 2 weeks (last cycle)  No orders of the defined types were placed in this encounter.   All questions were answered. The patient knows to call the clinic with any problems, questions or concerns.  I spent 20 minutes counseling the patient face to face. The total time spent in the appointment was 25 minutes and more than 50% was on counseling.     Truitt Merle, MD 08/20/2016   This document serves as a record of services personally performed by Truitt Merle,  MD. It was created on her behalf by Joslyn Devon, a trained medical scribe. The creation of this record is based on the scribe's personal observations and the provider's statements to them. This document has been checked and approved by the attending provider.

## 2016-08-20 ENCOUNTER — Other Ambulatory Visit: Payer: Self-pay | Admitting: Hematology

## 2016-08-20 ENCOUNTER — Encounter: Payer: Self-pay | Admitting: Hematology

## 2016-08-20 ENCOUNTER — Ambulatory Visit (HOSPITAL_BASED_OUTPATIENT_CLINIC_OR_DEPARTMENT_OTHER): Payer: Medicare Other | Admitting: Hematology

## 2016-08-20 ENCOUNTER — Other Ambulatory Visit (HOSPITAL_BASED_OUTPATIENT_CLINIC_OR_DEPARTMENT_OTHER): Payer: Medicare Other

## 2016-08-20 ENCOUNTER — Ambulatory Visit (HOSPITAL_BASED_OUTPATIENT_CLINIC_OR_DEPARTMENT_OTHER): Payer: Medicare Other

## 2016-08-20 ENCOUNTER — Ambulatory Visit: Payer: Medicare Other

## 2016-08-20 VITALS — BP 142/67 | HR 70 | Temp 98.3°F | Resp 18 | Ht 65.0 in | Wt 115.6 lb

## 2016-08-20 DIAGNOSIS — C772 Secondary and unspecified malignant neoplasm of intra-abdominal lymph nodes: Secondary | ICD-10-CM

## 2016-08-20 DIAGNOSIS — Z5111 Encounter for antineoplastic chemotherapy: Secondary | ICD-10-CM

## 2016-08-20 DIAGNOSIS — C187 Malignant neoplasm of sigmoid colon: Secondary | ICD-10-CM

## 2016-08-20 DIAGNOSIS — Z720 Tobacco use: Secondary | ICD-10-CM | POA: Diagnosis not present

## 2016-08-20 DIAGNOSIS — D696 Thrombocytopenia, unspecified: Secondary | ICD-10-CM

## 2016-08-20 DIAGNOSIS — R200 Anesthesia of skin: Secondary | ICD-10-CM

## 2016-08-20 DIAGNOSIS — Z95828 Presence of other vascular implants and grafts: Secondary | ICD-10-CM

## 2016-08-20 LAB — COMPREHENSIVE METABOLIC PANEL
ALT: 20 U/L (ref 0–55)
AST: 19 U/L (ref 5–34)
Albumin: 3.7 g/dL (ref 3.5–5.0)
Alkaline Phosphatase: 98 U/L (ref 40–150)
Anion Gap: 8 mEq/L (ref 3–11)
BILIRUBIN TOTAL: 0.22 mg/dL (ref 0.20–1.20)
BUN: 17 mg/dL (ref 7.0–26.0)
CHLORIDE: 108 meq/L (ref 98–109)
CO2: 25 meq/L (ref 22–29)
CREATININE: 0.8 mg/dL (ref 0.7–1.3)
Calcium: 9.5 mg/dL (ref 8.4–10.4)
EGFR: >90 mL/min/{1.73_m2} (ref >90)
GLUCOSE: 132 mg/dL (ref 70–140)
Potassium: 4.1 mEq/L (ref 3.5–5.1)
SODIUM: 140 meq/L (ref 136–145)
TOTAL PROTEIN: 6.9 g/dL (ref 6.4–8.3)

## 2016-08-20 LAB — CBC WITH DIFFERENTIAL/PLATELET
BASO%: 0.8 % (ref 0.0–2.0)
Basophils Absolute: 0.1 10*3/uL (ref 0.0–0.1)
EOS%: 1.5 % (ref 0.0–7.0)
Eosinophils Absolute: 0.1 10*3/uL (ref 0.0–0.5)
HCT: 38.3 % — ABNORMAL LOW (ref 38.4–49.9)
HGB: 13.2 g/dL (ref 13.0–17.1)
LYMPH%: 38.1 % (ref 14.0–49.0)
MCH: 32.9 pg (ref 27.2–33.4)
MCHC: 34.4 g/dL (ref 32.0–36.0)
MCV: 95.6 fL (ref 79.3–98.0)
MONO#: 0.8 10*3/uL (ref 0.1–0.9)
MONO%: 11.1 % (ref 0.0–14.0)
NEUT%: 48.5 % (ref 39.0–75.0)
NEUTROS ABS: 3.4 10*3/uL (ref 1.5–6.5)
Platelets: 143 10*3/uL (ref 140–400)
RBC: 4 10*6/uL — AB (ref 4.20–5.82)
RDW: 17.1 % — ABNORMAL HIGH (ref 11.0–14.6)
WBC: 6.9 10*3/uL (ref 4.0–10.3)
lymph#: 2.6 10*3/uL (ref 0.9–3.3)

## 2016-08-20 MED ORDER — PALONOSETRON HCL INJECTION 0.25 MG/5ML
INTRAVENOUS | Status: AC
  Filled 2016-08-20: qty 5

## 2016-08-20 MED ORDER — DEXTROSE 5 % IV SOLN
Freq: Once | INTRAVENOUS | Status: AC
  Administered 2016-08-20: 15:00:00 via INTRAVENOUS

## 2016-08-20 MED ORDER — SODIUM CHLORIDE 0.9 % IV SOLN
Freq: Once | INTRAVENOUS | Status: AC
  Administered 2016-08-20: 15:00:00 via INTRAVENOUS
  Filled 2016-08-20: qty 5

## 2016-08-20 MED ORDER — PALONOSETRON HCL INJECTION 0.25 MG/5ML
0.2500 mg | Freq: Once | INTRAVENOUS | Status: AC
  Administered 2016-08-20: 0.25 mg via INTRAVENOUS

## 2016-08-20 MED ORDER — SODIUM CHLORIDE 0.9 % IV SOLN
2400.0000 mg/m2 | INTRAVENOUS | Status: DC
  Administered 2016-08-20: 3700 mg via INTRAVENOUS
  Filled 2016-08-20: qty 74

## 2016-08-20 MED ORDER — SODIUM CHLORIDE 0.9% FLUSH
10.0000 mL | Freq: Once | INTRAVENOUS | Status: AC
  Administered 2016-08-20: 10 mL
  Filled 2016-08-20: qty 10

## 2016-08-20 MED ORDER — LEUCOVORIN CALCIUM INJECTION 350 MG
400.0000 mg/m2 | Freq: Once | INTRAVENOUS | Status: AC
  Administered 2016-08-20: 620 mg via INTRAVENOUS
  Filled 2016-08-20: qty 31

## 2016-08-20 MED ORDER — SODIUM CHLORIDE 0.9% FLUSH
10.0000 mL | INTRAVENOUS | Status: DC | PRN
  Filled 2016-08-20: qty 10

## 2016-08-20 MED ORDER — OXALIPLATIN CHEMO INJECTION 100 MG/20ML
85.0000 mg/m2 | Freq: Once | INTRAVENOUS | Status: AC
  Administered 2016-08-20: 130 mg via INTRAVENOUS
  Filled 2016-08-20: qty 20

## 2016-08-20 NOTE — Patient Instructions (Signed)
Implanted Port Home Guide An implanted port is a type of central line that is placed under the skin. Central lines are used to provide IV access when treatment or nutrition needs to be given through a person's veins. Implanted ports are used for long-term IV access. An implanted port may be placed because:  You need IV medicine that would be irritating to the small veins in your hands or arms.  You need long-term IV medicines, such as antibiotics.  You need IV nutrition for a long period.  You need frequent blood draws for lab tests.  You need dialysis.  Implanted ports are usually placed in the chest area, but they can also be placed in the upper arm, the abdomen, or the leg. An implanted port has two main parts:  Reservoir. The reservoir is round and will appear as a small, raised area under your skin. The reservoir is the part where a needle is inserted to give medicines or draw blood.  Catheter. The catheter is a thin, flexible tube that extends from the reservoir. The catheter is placed into a large vein. Medicine that is inserted into the reservoir goes into the catheter and then into the vein.  How will I care for my incision site? Do not get the incision site wet. Bathe or shower as directed by your health care provider. How is my port accessed? Special steps must be taken to access the port:  Before the port is accessed, a numbing cream can be placed on the skin. This helps numb the skin over the port site.  Your health care provider uses a sterile technique to access the port. ? Your health care provider must put on a mask and sterile gloves. ? The skin over your port is cleaned carefully with an antiseptic and allowed to dry. ? The port is gently pinched between sterile gloves, and a needle is inserted into the port.  Only "non-coring" port needles should be used to access the port. Once the port is accessed, a blood return should be checked. This helps ensure that the port  is in the vein and is not clogged.  If your port needs to remain accessed for a constant infusion, a clear (transparent) bandage will be placed over the needle site. The bandage and needle will need to be changed every week, or as directed by your health care provider.  Keep the bandage covering the needle clean and dry. Do not get it wet. Follow your health care provider's instructions on how to take a shower or bath while the port is accessed.  If your port does not need to stay accessed, no bandage is needed over the port.  What is flushing? Flushing helps keep the port from getting clogged. Follow your health care provider's instructions on how and when to flush the port. Ports are usually flushed with saline solution or a medicine called heparin. The need for flushing will depend on how the port is used.  If the port is used for intermittent medicines or blood draws, the port will need to be flushed: ? After medicines have been given. ? After blood has been drawn. ? As part of routine maintenance.  If a constant infusion is running, the port may not need to be flushed.  How long will my port stay implanted? The port can stay in for as long as your health care provider thinks it is needed. When it is time for the port to come out, surgery will be   done to remove it. The procedure is similar to the one performed when the port was put in. When should I seek immediate medical care? When you have an implanted port, you should seek immediate medical care if:  You notice a bad smell coming from the incision site.  You have swelling, redness, or drainage at the incision site.  You have more swelling or pain at the port site or the surrounding area.  You have a fever that is not controlled with medicine.  This information is not intended to replace advice given to you by your health care provider. Make sure you discuss any questions you have with your health care provider. Document  Released: 01/01/2005 Document Revised: 06/09/2015 Document Reviewed: 09/08/2012 Elsevier Interactive Patient Education  2017 [employer]  

## 2016-08-20 NOTE — Patient Instructions (Signed)
[facility] Discharge Instructions for Patients Receiving Chemotherapy  Today you received the following chemotherapy agents: Oxaliplatin, Leucovorin and Adrucil.   To help prevent nausea and vomiting after your treatment, we encourage you to take your nausea medication as directed.  If you develop nausea and vomiting that is not controlled by your nausea medication, call the clinic.   BELOW ARE SYMPTOMS THAT SHOULD BE REPORTED IMMEDIATELY:  *FEVER GREATER THAN 100.5 F  *CHILLS WITH OR WITHOUT FEVER  NAUSEA AND VOMITING THAT IS NOT CONTROLLED WITH YOUR NAUSEA MEDICATION  *UNUSUAL SHORTNESS OF BREATH  *UNUSUAL BRUISING OR BLEEDING  TENDERNESS IN MOUTH AND THROAT WITH OR WITHOUT PRESENCE OF ULCERS  *URINARY PROBLEMS  *BOWEL PROBLEMS  UNUSUAL RASH Items with * indicate a potential emergency and should be followed up as soon as possible.  Feel free to call the clinic you have any questions or concerns. The clinic phone number is [phone].  Please show the CHEMO ALERT CARD at check-in to the Emergency Department and triage nurse.   

## 2016-08-27 ENCOUNTER — Other Ambulatory Visit: Payer: Self-pay

## 2016-08-27 DIAGNOSIS — N401 Enlarged prostate with lower urinary tract symptoms: Secondary | ICD-10-CM

## 2016-08-28 ENCOUNTER — Other Ambulatory Visit: Payer: Medicare Other

## 2016-08-28 DIAGNOSIS — N401 Enlarged prostate with lower urinary tract symptoms: Secondary | ICD-10-CM | POA: Diagnosis not present

## 2016-08-29 LAB — PSA: PROSTATE SPECIFIC AG, SERUM: 0.7 ng/mL (ref 0.0–4.0)

## 2016-08-29 NOTE — Progress Notes (Signed)
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 Resected and retrieved. - Rule out malignancy, tumor in the sigmoid colon. Biopsied. Tattooed. - Internal hemorrhoids. - The examination was otherwise normal on direct and retroflexion views.       03/14/2016 Imaging    CT A/P W CONTRAST  IMPRESSION: Short segment area of wall thickening in the distal sigmoid colon, suspicious for site of primary colon carcinoma. Recommend correlation with colonoscopy results.      04/23/2016 Imaging    CT Chest W Contrast IMPRESSION: 1. No evidence of pulmonary metastasis. 2. Bilateral calcified pulmonary nodules and mediastinal lymph nodes consistent with granulomatous disease. 3. Centrilobular emphysema. 4. Coronary artery calcification and aortic atherosclerotic calcification.      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 Colon, segmental resection for tumor, sigmoid - INVASIVE ADENOCARCINOMA, WELL TO MODERATELY DIFFERENTIATED, WITH ABUNDANT MUCIN (PT3 PN1) -  METASTATIC ADENOCARCINOMA IN ONE OF TWENTY-TWO LYMPH NODES (1/22) - TWO TUMOR DEPOSITS PRESENT - MARGINS UNINVOLVED BY CARCINOMA - HYPERPLASTIC POLYPS (X2) - SEE ONCOLOGY TABLE BELOW 2. Colon, resection margin (donut), final distal margin - BENIGN COLON - NO MALIGNANCY IDENTIFIED      05/21/2016 Surgery    LAPAROSCOPIC PARTIAL COLECTOMY, SPLENIC FLEXURE MOBILIZATION AND PROCTOSCOPY      06/25/2016 -  Chemotherapy    FOLFOX every 2 weeks for 3 months        HISTORY OF PRESENTING ILLNESS:  Mario Mcdowell 66 y.o. male is here because of recently diagnosed colorectalcancer. He has reported to Dr. [name] on 05/18/95 for a evaluation of a rectosigmoid colon mass. 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 He denied any change in his bowel movement or habits of rectal bleeding.   He had a screening colonoscopy by Dr. Fuller Plan 03/12/2016. Who referred him to [name]. He denies any symptoms before. Before surgery, no changes in weight loss or change in energy level  Since the surgery, his appetite has been good and his energy level is fine.No pain reported from the incision. He takes hydrocodone for his pain.  August 08, 2015 he was admitted to the hospital for right lower quadrant abdominal pain due to a kidney stone.  He presents to the clinic today with his wife for consult of his stage 3 colon cancer and chemotherapy. The patient's wife says he doesn't go to the doctor often. He is complaint and wants to do whatever he can to get treated. He is worried about what he can eat when he can start chemo.  CURRENT THERAPY: adjuvant FOLFOX every 2 weeks,  starting 06/25/2016, plan for 6 cycles   INTERVAL HISTORY:  Mario Mcdowell returns for follow up and cycle 6 FOLFOX treatment. Patient arrived late for his appointment and was seen in the infusion area today. He questions when port-a-cath will be removed as he is worried about lifting things with port-a-cath in place. He does report  numbness from left foot to left buttock. He reports one instance of his left knee "giving out". He denies any issue with his right leg. He denies any issues with his hands or fingers. He has been taking Dulcolax twice daily. He denies nausea or stomach cramps at this time. He reports nausea and weakness after treatment which resolves by Friday of treatment week. Reports he is able to drink cold drinks again by the Friday of treatment week. He continues to try and reduce his cigarette smoking. He does not need any refills at this time.    MEDICAL HISTORY:  Past Medical History:  Diagnosis Date  . Aorto-iliac atherosclerosis ([name])    by CT; in bilateral legs   . Arthritis   . Atherosclerotic PVD with intermittent claudication ([name])   . BPH without obstruction/lower urinary tract symptoms 10/25/2015  . Centrilobular emphysema ([name])    per chest CT 04-23-2016  . Colon adenocarcinoma [name]) oncologist- dr Burr Medico   dx 05-21-2016 -- Stage IIIB (pT3,pN1,M0) , Invasive sigmoid adenocarcinoma well to moderately differentiated with metastatic intra-abdominal lymph node (1 out of 22), s/p lap. partial colectomy [name]) 05/21/2016 and chemotherapy is planned  . Full dentures   . History of kidney stones 07/2015   passed without intervention  . Pulmonary nodules    per chest CT 04-23-2016 bilateral nodules (consistent w/ granulomatous disease)  . Smokers' cough ([name])   . Wears contact lenses     SURGICAL HISTORY: Past Surgical History:  Procedure Laterality Date  . COLONOSCOPY  03/12/2016   4 polyps, sigmoid tumor biopsied (TVA w/ MICROSCOPIC FOCUS SUSPICIOUS FOR INVASION) pending surgical resection, rpt 1 yr Fuller Plan)  . LAPAROSCOPIC PARTIAL COLECTOMY N/A 05/21/2016   colon cancer, 1/20 positive lymp nodes. LAPAROSCOPIC PARTIAL COLECTOMY, SPLENIC FLEXURE MOBILIZATION AND PROCTOSCOPY;  [name], MD)  . MULTIPLE TOOTH EXTRACTIONS  1980's  . PORTACATH PLACEMENT N/A 06/21/2016   Procedure: INSERTION  PORT-A-CATH;  Surgeon: [name], MD;  Location: [name];  Service: General;  Laterality: N/A;  right    SOCIAL HISTORY: Social History   Social History  . Marital status: Married    Spouse name: N/A  . Number of children: N/A  . Years of education: N/A   Occupational History  . Not on file.   Social History Main Topics  . Smoking status: Current Every Day Smoker    Packs/day: 0.25    Years: 46.00    Types: Cigarettes  . Smokeless tobacco: Never Used     Comment: PT TRYING TO QUIT DOWN TO 1PP3 DAYS FROM 1.5PPD  . Alcohol use No     Comment: quit 2000  . Drug use: No  . Sexual activity: No   Other Topics Concern  . Not on file   Social History Narrative   Lives with wife, adopted daughter, adopted son   Occ: Games developer   Edu: 8th grade     FAMILY HISTORY: Family History  Problem Relation Age of Onset  . Cancer Paternal Aunt        unsure details  . Diabetes Cousin   . Cancer Paternal Uncle  unknow type cancer   . Prostate cancer Neg Hx   . Kidney cancer Neg Hx   . CAD Neg Hx   . Stroke Neg Hx   . Colon cancer Neg Hx     ALLERGIES:  is allergic to penicillins and lipitor [atorvastatin].  MEDICATIONS:  Current Outpatient Prescriptions  Medication Sig Dispense Refill  . acetaminophen (TYLENOL) 500 MG chewable tablet Chew 500 mg by mouth as needed for pain.    Marland Kitchen aspirin EC 81 MG tablet Take 1 tablet (81 mg total) by mouth daily.    Marland Kitchen lidocaine-prilocaine (EMLA) cream Apply to affected area once 30 g 3  . promethazine (PHENERGAN) 25 MG tablet TAKE 1 TABLET BY MOUTH EVERY 4 TO 6 HOURS AS NEEDED FOR NAUSEA 20 tablet 1  . traZODone (DESYREL) 50 MG tablet Take 0.5-1 tablets (25-50 mg total) by mouth at bedtime as needed for sleep. (Patient not taking: Reported on 08/20/2016) 30 tablet 3   No current facility-administered medications for this visit.     REVIEW OF SYSTEMS:  Constitutional: Denies fevers, chills or abnormal night  sweats Eyes: Denies blurriness of vision, double vision or watery eyes Ears, nose, mouth, throat, and face: Denies mucositis or sore throat Respiratory: Denies cough, dyspnea or wheezes Cardiovascular: Denies palpitation, chest discomfort or lower extremity swelling Gastrointestinal:  Denies heartburn or change in bowel habits  Skin: Denies abnormal skin rashes Lymphatics: Denies new lymphadenopathy or easy bruising  Neurological:Denies numbness, tingling or new weaknesses (+) occasional numbness of left foot to left buttock Behavioral/Psych: Mood is stable, no new changes  All other systems were reviewed with the patient and are negative.  PHYSICAL EXAMINATION:  ECOG PERFORMANCE STATUS: 1 - Symptomatic but completely ambulatory  There were no vitals filed for this visit. Filed Weights   Vitals with BMI 09/03/2016  Height   Weight (No Data)  BMI   Systolic 417  Diastolic 84  Pulse 78  Respirations 17    GENERAL:alert, no distress and comfortable. Seen in infusion area, presents in treatment chair. SKIN: skin color, texture, turgor are normal, no rashes or significant lesions EYES: normal, conjunctiva are pink and non-injected, sclera clear OROPHARYNX:no exudate, no erythema and lips, buccal mucosa, and tongue normal  NECK: supple, thyroid normal size, non-tender, without nodularity LYMPH:  no palpable lymphadenopathy in the cervical, axillary or inguinal LUNGS: clear to auscultation and percussion with normal breathing effort HEART: regular rate & rhythm and no murmurs and no lower extremity edema ABDOMEN:abdomen soft, non-tender and normal bowel sounds, surgical incisions are well healed  Musculoskeletal:no cyanosis of digits and no clubbing  PSYCH: alert & oriented x 3 with fluent speech NEURO: no focal motor/sensory deficits  LABORATORY DATA:  I have reviewed the data as listed CBC Latest Ref Rng & Units 09/03/2016 08/20/2016 08/06/2016  WBC 4.0 - 10.3 10e3/uL 7.3 6.9 5.7   Hemoglobin 13.0 - 17.1 g/dL 12.9(L) 13.2 12.9(L)  Hematocrit 38.4 - 49.9 % 38.4 38.3(L) 37.9(L)  Platelets 140 - 400 10e3/uL 125(L) 143 117(L)   CMP Latest Ref Rng & Units 09/03/2016 08/20/2016 08/06/2016  Glucose 70 - 140 mg/dl 77 132 149(H)  BUN 7.0 - 26.0 mg/dL 12.5 17.0 11.9  Creatinine 0.7 - 1.3 mg/dL 0.9 0.8 0.9  Sodium 136 - 145 mEq/L 144 140 141  Potassium 3.5 - 5.1 mEq/L 4.1 4.1 4.0  Chloride 101 - 111 mmol/L - - -  CO2 22 - 29 mEq/L _0 Calcium 8.4 - 10.4 mg/dL 9.7 9.5  9.1  Total Protein 6.4 - 8.3 g/dL 6.7 6.9 6.2(L)  Total Bilirubin 0.20 - 1.20 mg/dL <0.22 0.22 0.23  Alkaline Phos 40 - 150 U/L 97 98 98  AST 5 - 34 U/L _0 ALT 0 - 55 U/L _1 Pathology report  Diagnosis 05/21/16 1. Colon, segmental resection for tumor, sigmoid - INVASIVE ADENOCARCINOMA, WELL TO MODERATELY DIFFERENTIATED, WITH ABUNDANT MUCIN (PT3 PN1) - METASTATIC ADENOCARCINOMA IN ONE OF TWENTY-TWO LYMPH NODES (1/22) - TWO TUMOR DEPOSITS PRESENT - MARGINS UNINVOLVED BY CARCINOMA - HYPERPLASTIC POLYPS (X2) - SEE ONCOLOGY TABLE BELOW 2. Colon, resection margin (donut), final distal margin - BENIGN COLON - NO MALIGNANCY IDENTIFIED Microscopic Comment 1. ONCOLOGY TABLE: COLON Specimen: Sigmoid colon Procedure: Sigmoidectomy Tumor site: Sigmoid Macroscopic tumor perforation: Not identified Invasive tumor: 3.4 x 2.5 x 0.6 cm Histologic type(s): Adenocarcinoma with abundant mucin Histologic grade and differentiation: G1- G2 Microscopic extension of invasive tumor: Tumor invades muscularis propria Lymph-Vascular invasion: Not identified Perineural invasion: Not identified Treatment Effect: No known presurgical therapy Tumor deposit(s): Present, 2 Resection margins: All margins are uninvolved by invasive carcinoma, high-grade dysplasia, intramucosal adenocarcinoma, and adenoma Margins examined: Proximal, distal and Radial Distance closest margin (if all above margins negative): 3.6 cm  (distal) Additional polyp(s): Hyperplastic polyps (x2) Lymph nodes: number examined 22; number positive: 1 1 of 5 Supplemental copy SUPPLEMENTAL for MUKESH, KORNEGAY [id number]) Microscopic Comment(continued) Pathologic Staging: pT3, pN1 Ancillary studies: performed on block 1G (MMR by immunohistochemistry and MSI by PCR; will be reported separately.) Dr. Lyndon Code has reviewed the pertinent slide(1E) and agrees that adenocarcinoma is present in this sample.  RADIOGRAPHIC STUDIES: I have personally reviewed the radiological images as listed and agreed with the findings in the report. No results found.   CT CHEST W CONTRAST 04/23/16 IMPRESSION: 1. No evidence of pulmonary metastasis. 2. Bilateral calcified pulmonary nodules and mediastinal lymph nodes consistent with granulomatous disease. 3. Centrilobular emphysema. 4. Coronary artery calcification and aortic atherosclerotic calcification.  CT A/P W CONTRAST 03/14/16 IMPRESSION: Short segment area of wall thickening in the distal sigmoid colon, suspicious for site of primary colon carcinoma. Recommend correlation with colonoscopy results.  No evidence of local or distant metastatic disease. No evidence of bowel obstruction.  PROCEDURES Colonoscopy 03/12/2016 Four 6 to 8 mm polyps in the rectum and in the transverse colon, removed with a cold snare. Resected and retrieved. - Rule out malignancy, tumor in the sigmoid colon. Biopsied. Tattooed. - Internal hemorrhoids. - The examination was otherwise normal on direct and retroflexion views.   ASSESSMENT & PLAN:  66 y.o.  male  1. 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 -We previously discussed the risk of cancer recurrence after surgery. Due to his stage IIIB  disease, he is at high risk for recurrence -We previously discussed the standard adjuvant chemotherapy for stage III colon cancer,  reduce the risk of cancer recurrence. -We previously discussed the regimens for adjuvant chemotherapy, including FOLFOX, CAPOX, or single agent Xeloda or 5-FU. He is 3, but does not have many medical comorbidities and recovered from surgery quickly. I recommended him to consider FOLFOX -We previously discussed the duration of adjuvant chemotherapy. Based on the recent reported IDEA study in ASCO 2017, I recommended 3 months therapy.  -He has started adjuvant chemotherapy, tolerating well  overall, mild nausea, no neuropathy so far. -Lab reviewed, adequate for treatment, continue with last cycle FOLFOX today. --I discussed the risk of cancer recurrence in the future. I discussed the surveillance plan, which is a physical exam and lab test (including CBC, CMP and CEA) every 3-4 months for the first 2 years, then every 6-12 months, colonoscopy in one year, and surveilliance CT scan every 6-12 month for up to 5 year.  -I plan to see him back in 3 month with a surveillance CT abd/pel  -He wants his port to be removed, he understands he will need a port placement again if he develops cancer recurrence. I will send a message to Dr. [name]   2. Smoking cessation  - I strongly encouraged him to work towards quitting. I recommended Nicotine patches or counseling to help him quit. - I again strongly encouraged him to cut back his smoking  3. Emphysema  4. BPH  5. History of kidney stones  6. Left leg numbness and muscular pain  -not typical for chemo induced neuropathy  -continue monitoring   PLAN -Received last cycle FOLFOX today.  -He would like to remove his port.  -Follow up in 3 months with scan CT abd/pel w contrast a few days before   Orders Placed This Encounter  Procedures  . CT Abdomen Pelvis W Contrast    Standing Status:   Future    Standing Expiration  Date:   09/03/2017    Order Specific Question:   If indicated for the ordered procedure, I authorize the administration of contrast media per Radiology protocol    Answer:   Yes    Order Specific Question:   Preferred imaging location?    Answer:   [facility]    Order Specific Question:   Radiology Contrast Protocol - do NOT remove file path    Answer:   [email or web address]    All questions were answered. The patient knows to call the clinic with any problems, questions or concerns.  I spent 20 minutes counseling the patient face to face. The total time spent in the appointment was 25 minutes and more than 50% was on counseling.  This document serves as a record of services personally performed by Truitt Merle, MD. It was created on her behalf by Arlyce Harman, a trained medical scribe. The creation of this record is based on the scribe's personal observations and the provider's statements to them. This document has been checked and approved by the attending provider.    Truitt Merle, MD 09/03/2016

## 2016-09-03 ENCOUNTER — Ambulatory Visit (HOSPITAL_BASED_OUTPATIENT_CLINIC_OR_DEPARTMENT_OTHER): Payer: Medicare Other

## 2016-09-03 ENCOUNTER — Ambulatory Visit (HOSPITAL_BASED_OUTPATIENT_CLINIC_OR_DEPARTMENT_OTHER): Payer: Medicare Other | Admitting: Hematology

## 2016-09-03 ENCOUNTER — Encounter: Payer: Self-pay | Admitting: Hematology

## 2016-09-03 ENCOUNTER — Ambulatory Visit: Payer: Medicare Other

## 2016-09-03 ENCOUNTER — Other Ambulatory Visit (HOSPITAL_BASED_OUTPATIENT_CLINIC_OR_DEPARTMENT_OTHER): Payer: Medicare Other

## 2016-09-03 VITALS — BP 134/84 | HR 78 | Temp 98.5°F | Resp 17

## 2016-09-03 DIAGNOSIS — C187 Malignant neoplasm of sigmoid colon: Secondary | ICD-10-CM

## 2016-09-03 DIAGNOSIS — C772 Secondary and unspecified malignant neoplasm of intra-abdominal lymph nodes: Principal | ICD-10-CM

## 2016-09-03 DIAGNOSIS — C182 Malignant neoplasm of ascending colon: Secondary | ICD-10-CM | POA: Diagnosis not present

## 2016-09-03 DIAGNOSIS — Z5111 Encounter for antineoplastic chemotherapy: Secondary | ICD-10-CM

## 2016-09-03 DIAGNOSIS — Z720 Tobacco use: Secondary | ICD-10-CM | POA: Diagnosis not present

## 2016-09-03 DIAGNOSIS — R200 Anesthesia of skin: Secondary | ICD-10-CM

## 2016-09-03 DIAGNOSIS — C787 Secondary malignant neoplasm of liver and intrahepatic bile duct: Secondary | ICD-10-CM | POA: Diagnosis not present

## 2016-09-03 LAB — COMPREHENSIVE METABOLIC PANEL
ALT: 26 U/L (ref 0–55)
AST: 24 U/L (ref 5–34)
Albumin: 3.6 g/dL (ref 3.5–5.0)
Alkaline Phosphatase: 97 U/L (ref 40–150)
Anion Gap: 8 mEq/L (ref 3–11)
BUN: 12.5 mg/dL (ref 7.0–26.0)
CHLORIDE: 111 meq/L — AB (ref 98–109)
CO2: 26 meq/L (ref 22–29)
CREATININE: 0.9 mg/dL (ref 0.7–1.3)
Calcium: 9.7 mg/dL (ref 8.4–10.4)
EGFR: 84 mL/min/{1.73_m2} — ABNORMAL LOW (ref >90)
GLUCOSE: 77 mg/dL (ref 70–140)
POTASSIUM: 4.1 meq/L (ref 3.5–5.1)
SODIUM: 144 meq/L (ref 136–145)
Total Bilirubin: <0.22 mg/dL (ref 0.20–1.20)
Total Protein: 6.7 g/dL (ref 6.4–8.3)

## 2016-09-03 LAB — CBC WITH DIFFERENTIAL/PLATELET
BASO%: 0.4 % (ref 0.0–2.0)
BASOS ABS: 0 10*3/uL (ref 0.0–0.1)
EOS%: 1 % (ref 0.0–7.0)
Eosinophils Absolute: 0.1 10*3/uL (ref 0.0–0.5)
HEMATOCRIT: 38.4 % (ref 38.4–49.9)
HGB: 12.9 g/dL — ABNORMAL LOW (ref 13.0–17.1)
LYMPH#: 2.8 10*3/uL (ref 0.9–3.3)
LYMPH%: 38.5 % (ref 14.0–49.0)
MCH: 32.4 pg (ref 27.2–33.4)
MCHC: 33.6 g/dL (ref 32.0–36.0)
MCV: 96.5 fL (ref 79.3–98.0)
MONO#: 0.7 10*3/uL (ref 0.1–0.9)
MONO%: 10.1 % (ref 0.0–14.0)
NEUT#: 3.7 10*3/uL (ref 1.5–6.5)
NEUT%: 50 % (ref 39.0–75.0)
Platelets: 125 10*3/uL — ABNORMAL LOW (ref 140–400)
RBC: 3.98 10*6/uL — AB (ref 4.20–5.82)
RDW: 18.2 % — ABNORMAL HIGH (ref 11.0–14.6)
WBC: 7.3 10*3/uL (ref 4.0–10.3)

## 2016-09-03 LAB — CEA (IN HOUSE-CHCC): CEA (CHCC-IN HOUSE): 5.05 ng/mL — AB (ref 0.00–5.00)

## 2016-09-03 MED ORDER — PALONOSETRON HCL INJECTION 0.25 MG/5ML
0.2500 mg | Freq: Once | INTRAVENOUS | Status: AC
  Administered 2016-09-03: 0.25 mg via INTRAVENOUS

## 2016-09-03 MED ORDER — LEUCOVORIN CALCIUM INJECTION 350 MG
400.0000 mg/m2 | Freq: Once | INTRAVENOUS | Status: AC
  Administered 2016-09-03: 620 mg via INTRAVENOUS
  Filled 2016-09-03: qty 31

## 2016-09-03 MED ORDER — FLUOROURACIL CHEMO INJECTION 5 GM/100ML
2400.0000 mg/m2 | INTRAVENOUS | Status: DC
  Administered 2016-09-03: 3700 mg via INTRAVENOUS
  Filled 2016-09-03: qty 74

## 2016-09-03 MED ORDER — PALONOSETRON HCL INJECTION 0.25 MG/5ML
INTRAVENOUS | Status: AC
  Filled 2016-09-03: qty 5

## 2016-09-03 MED ORDER — SODIUM CHLORIDE 0.9 % IV SOLN
Freq: Once | INTRAVENOUS | Status: AC
  Administered 2016-09-03: 12:00:00 via INTRAVENOUS
  Filled 2016-09-03: qty 5

## 2016-09-03 MED ORDER — OXALIPLATIN CHEMO INJECTION 100 MG/20ML
85.0000 mg/m2 | Freq: Once | INTRAVENOUS | Status: AC
  Administered 2016-09-03: 130 mg via INTRAVENOUS
  Filled 2016-09-03: qty 16

## 2016-09-03 MED ORDER — DEXTROSE 5 % IV SOLN
Freq: Once | INTRAVENOUS | Status: AC
  Administered 2016-09-03: 12:00:00 via INTRAVENOUS

## 2016-09-03 NOTE — Patient Instructions (Signed)
[facility] Discharge Instructions for Patients Receiving Chemotherapy  Today you received the following chemotherapy agents Oxaliplatin, Leucovorin, 5FU  To help prevent nausea and vomiting after your treatment, we encourage you to take your nausea medication as directed.    If you develop nausea and vomiting that is not controlled by your nausea medication, call the clinic.   BELOW ARE SYMPTOMS THAT SHOULD BE REPORTED IMMEDIATELY:  *FEVER GREATER THAN 100.5 F  *CHILLS WITH OR WITHOUT FEVER  NAUSEA AND VOMITING THAT IS NOT CONTROLLED WITH YOUR NAUSEA MEDICATION  *UNUSUAL SHORTNESS OF BREATH  *UNUSUAL BRUISING OR BLEEDING  TENDERNESS IN MOUTH AND THROAT WITH OR WITHOUT PRESENCE OF ULCERS  *URINARY PROBLEMS  *BOWEL PROBLEMS  UNUSUAL RASH Items with * indicate a potential emergency and should be followed up as soon as possible.  Feel free to call the clinic you have any questions or concerns. The clinic phone number is [phone].  Please show the CHEMO ALERT CARD at check-in to the Emergency Department and triage nurse.   

## 2016-09-03 NOTE — Progress Notes (Signed)
09/04/2016 4:06 PM   Mario Mcdowell 1950-12-31 [id number]  Referring provider: Neena Rhymes, MD [address] [place], [place] [address]  Chief Complaint  Patient presents with  . Follow-up    1 year Ureteral Calculus/ BPH    HPI: Patient is a 66 year old Caucasian male with a history of nephrolithiasis, BPH with LU TS and meatal stenosis who presents today for a yearly follow up.     History of nephrolithiasis CT abdomen/pelvis with contrast noted a 3 mm right UVJ stone versus a recently passed right renal calculus with mild right hydronephrosis.  No evidence of bowel obstruction or active inflammation. Normal appendix.  RUS performed on 08/29/2015 noted no hydronephrosis.   BPH WITH LUTS  (prostate and/or bladder) His IPSS score today is 0, which is no lower urinary tract symptomatology. He is delighted with his quality life due to his urinary symptoms.     He has no major complaint today.  He denies any dysuria, hematuria or suprapubic pain.  He also denies any recent fevers, chills, nausea or vomiting.  He does not have a family history of PCa.      IPSS    Row Name 09/04/16 1500         International Prostate Symptom Score   How often have you had the sensation of not emptying your bladder? Not at All     How often have you had to urinate less than every two hours? Not at All     How often have you found you stopped and started again several times when you urinated? Not at All     How often have you found it difficult to postpone urination? Not at All     How often have you had a weak urinary stream? Not at All     How often have you had to strain to start urination? Not at All     How many times did you typically get up at night to urinate? None     Total IPSS Score 0       Quality of Life due to urinary symptoms   If you were to spend the rest of your life with your urinary condition just the way it is now how would you feel about that? Delighted         Score:  1-7 Mild 8-19 Moderate 20-35 Severe   Meatal stenosis Patient is not having any difficulty with urination.Marland Kitchen       PMH: Past Medical History:  Diagnosis Date  . Aorto-iliac atherosclerosis ([place])    by CT; in bilateral legs   . Arthritis   . Atherosclerotic PVD with intermittent claudication ([place])   . BPH without obstruction/lower urinary tract symptoms 10/25/2015  . Centrilobular emphysema ([place])    per chest CT 04-23-2016  . Colon adenocarcinoma [facility]) oncologist- dr Burr Medico   dx 05-21-2016 -- Stage IIIB (pT3,pN1,M0) , Invasive sigmoid adenocarcinoma well to moderately differentiated with metastatic intra-abdominal lymph node (1 out of 22), s/p lap. partial colectomy [name]) 05/21/2016 and chemotherapy is planned  . Full dentures   . History of kidney stones 07/2015   passed without intervention  . Pulmonary nodules    per chest CT 04-23-2016 bilateral nodules (consistent w/ granulomatous disease)  . Smokers' cough ([place])   . Wears contact lenses     Surgical History: Past Surgical History:  Procedure Laterality Date  . COLONOSCOPY  03/12/2016   4 polyps, sigmoid tumor biopsied (TVA  w/ MICROSCOPIC FOCUS SUSPICIOUS FOR INVASION) pending surgical resection, rpt 1 yr Fuller Plan)  . LAPAROSCOPIC PARTIAL COLECTOMY N/A 05/21/2016   colon cancer, 1/20 positive lymp nodes. LAPAROSCOPIC PARTIAL COLECTOMY, SPLENIC FLEXURE MOBILIZATION AND PROCTOSCOPY;  [name], MD)  . MULTIPLE TOOTH EXTRACTIONS  1980's  . PORTACATH PLACEMENT N/A 06/21/2016   Procedure: INSERTION PORT-A-CATH;  Surgeon: [name], MD;  Location: [facility];  Service: General;  Laterality: N/A;  right    Home Medications:  Allergies as of 09/04/2016      Reactions   Penicillins Swelling   Has patient had a PCN reaction causing immediate rash, facial/tongue/throat swelling, SOB or lightheadedness with hypotension: Yes Has patient had a PCN reaction causing severe rash involving mucus  membranes or skin necrosis: No Has patient had a PCN reaction that required hospitalization No Has patient had a PCN reaction occurring within the last 10 years: No If all of the above answers are "NO", then may proceed with Cephalosporin use.   Lipitor [atorvastatin] Nausea Only      Medication List       Accurate as of 09/04/16  4:06 PM. Always use your most recent med list.          acetaminophen 500 MG chewable tablet Commonly known as:  TYLENOL Chew 500 mg by mouth as needed for pain.   aspirin EC 81 MG tablet Take 1 tablet (81 mg total) by mouth daily.   lidocaine-prilocaine cream Commonly known as:  EMLA Apply to affected area once   promethazine 25 MG tablet Commonly known as:  PHENERGAN TAKE 1 TABLET BY MOUTH EVERY 4 TO 6 HOURS AS NEEDED FOR NAUSEA   traZODone 50 MG tablet Commonly known as:  DESYREL Take 0.5-1 tablets (25-50 mg total) by mouth at bedtime as needed for sleep.       Allergies:  Allergies  Allergen Reactions  . Penicillins Swelling    Has patient had a PCN reaction causing immediate rash, facial/tongue/throat swelling, SOB or lightheadedness with hypotension: Yes Has patient had a PCN reaction causing severe rash involving mucus membranes or skin necrosis: No Has patient had a PCN reaction that required hospitalization No Has patient had a PCN reaction occurring within the last 10 years: No If all of the above answers are "NO", then may proceed with Cephalosporin use.   . Lipitor [Atorvastatin] Nausea Only    Family History: Family History  Problem Relation Age of Onset  . Cancer Paternal Aunt        unsure details  . Diabetes Cousin   . Cancer Paternal Uncle        unknow type cancer   . Prostate cancer Neg Hx   . Kidney cancer Neg Hx   . CAD Neg Hx   . Stroke Neg Hx   . Colon cancer Neg Hx     Social History:  reports that he has been smoking Cigarettes.  He has a 11.50 pack-year smoking history. He has never used smokeless  tobacco. He reports that he does not drink alcohol or use drugs.  ROS: UROLOGY Frequent Urination?: No Hard to postpone urination?: No Burning/pain with urination?: No Get up at night to urinate?: No Leakage of urine?: No Urine stream starts and stops?: No Trouble starting stream?: No Do you have to strain to urinate?: No Blood in urine?: No Urinary tract infection?: No Sexually transmitted disease?: No Injury to kidneys or bladder?: No Painful intercourse?: No Weak stream?: No Erection problems?: No Penile pain?:  No  Gastrointestinal Nausea?: No Vomiting?: No Indigestion/heartburn?: No Diarrhea?: No Constipation?: No  Constitutional Fever: No Night sweats?: No Weight loss?: No Fatigue?: No  Skin Skin rash/lesions?: No Itching?: No  Eyes Blurred vision?: No Double vision?: No  Ears/Nose/Throat Sore throat?: No Sinus problems?: No  Hematologic/Lymphatic Swollen glands?: No Easy bruising?: No  Cardiovascular Leg swelling?: No Chest pain?: No  Respiratory Cough?: No Shortness of breath?: No  Endocrine Excessive thirst?: No  Musculoskeletal Back pain?: No Joint pain?: No  Neurological Headaches?: No Dizziness?: No  Psychologic Depression?: No Anxiety?: No  Physical Exam: BP 100/67   Pulse 80   Ht 5\' 5"  (1.651 m)   Wt 118 lb (53.5 kg)   BMI 19.64 kg/m   Constitutional: Well nourished. Alert and oriented, No acute distress. HEENT: [place] AT, moist mucus membranes. Trachea midline, no masses. Cardiovascular: No clubbing, cyanosis, or edema. Respiratory: Normal respiratory effort, no increased work of breathing. GI: Abdomen is soft, non tender, non distended, no abdominal masses. Liver and spleen not palpable.  No hernias appreciated.  Stool sample for occult testing is not indicated.   GU: No CVA tenderness.  No bladder fullness or masses.  Patient deferred genital and rectal exam.   Skin: No rashes, bruises or suspicious lesions. Lymph:  No cervical or inguinal adenopathy. Neurologic: Grossly intact, no focal deficits, moving all 4 extremities. Psychiatric: Normal mood and affect.  Laboratory Data: PSA History  0.7 ng/mL on 08/28/2016     Lab Results  Component Value Date   WBC 7.3 09/03/2016   HGB 12.9 (L) 09/03/2016   HCT 38.4 09/03/2016   MCV 96.5 09/03/2016   PLT 125 (L) 09/03/2016    Lab Results  Component Value Date   CREATININE 0.9 09/03/2016    Lab Results  Component Value Date   AST 24 09/03/2016   Lab Results  Component Value Date   ALT 26 09/03/2016   I have reviewed the labs   Assessment & Plan:    1. BPH with LUTS  - PSA was 0.7 ng/mL on 08/21/2016  - RTC in 12 months for IPSS, PSA and exam  2. Meatal stenosis  - not having issues with urination  - continue to monitor with yearly IPSS    Return in about 1 year (around 09/04/2017) for IPSS, PSA and exam.  These notes generated with voice recognition software. I apologize for typographical errors.  Zara Council, [facility] [address] [place], [place] [address] [phone]

## 2016-09-04 ENCOUNTER — Encounter: Payer: Self-pay | Admitting: Urology

## 2016-09-04 ENCOUNTER — Telehealth: Payer: Self-pay | Admitting: Hematology

## 2016-09-04 ENCOUNTER — Ambulatory Visit: Payer: Medicare Other | Admitting: Urology

## 2016-09-04 VITALS — BP 100/67 | HR 80 | Ht 65.0 in | Wt 118.0 lb

## 2016-09-04 DIAGNOSIS — IMO0002 Reserved for concepts with insufficient information to code with codable children: Secondary | ICD-10-CM

## 2016-09-04 DIAGNOSIS — N359 Urethral stricture, unspecified: Secondary | ICD-10-CM

## 2016-09-04 DIAGNOSIS — N400 Enlarged prostate without lower urinary tract symptoms: Secondary | ICD-10-CM | POA: Diagnosis not present

## 2016-09-04 NOTE — Telephone Encounter (Signed)
Called patient regarding the addition of some appointments in November. I left a voicemail about this and told them we could print out an updated schedule when they come in the office tomorrow.

## 2016-09-05 ENCOUNTER — Ambulatory Visit (HOSPITAL_BASED_OUTPATIENT_CLINIC_OR_DEPARTMENT_OTHER): Payer: Medicare Other

## 2016-09-05 VITALS — BP 122/69 | HR 65 | Temp 99.4°F | Resp 18

## 2016-09-05 DIAGNOSIS — C187 Malignant neoplasm of sigmoid colon: Secondary | ICD-10-CM

## 2016-09-05 DIAGNOSIS — C772 Secondary and unspecified malignant neoplasm of intra-abdominal lymph nodes: Secondary | ICD-10-CM

## 2016-09-05 MED ORDER — SODIUM CHLORIDE 0.9% FLUSH
10.0000 mL | INTRAVENOUS | Status: DC | PRN
  Administered 2016-09-05: 10 mL
  Filled 2016-09-05: qty 10

## 2016-09-05 MED ORDER — HEPARIN SOD (PORK) LOCK FLUSH 100 UNIT/ML IV SOLN
500.0000 [IU] | Freq: Once | INTRAVENOUS | Status: AC | PRN
  Administered 2016-09-05: 500 [IU]
  Filled 2016-09-05: qty 5

## 2016-09-12 ENCOUNTER — Other Ambulatory Visit: Payer: Self-pay | Admitting: General Surgery

## 2016-09-28 ENCOUNTER — Encounter (HOSPITAL_BASED_OUTPATIENT_CLINIC_OR_DEPARTMENT_OTHER): Payer: Self-pay | Admitting: *Deleted

## 2016-09-28 NOTE — Progress Notes (Signed)
To [facility] at 1300-Hg on arrival.-Npo after Mm solids-clear liquids ( no dairy or pulp )until 0830,then Npo. Spoke with [place] scheduler regarding conflicting pre op antibiotic order.Will send message to Dr [name] to clarify.

## 2016-10-04 ENCOUNTER — Encounter (HOSPITAL_BASED_OUTPATIENT_CLINIC_OR_DEPARTMENT_OTHER)
Admission: RE | Disposition: A | Discharge status: Home / Self Care | Payer: Self-pay | Source: Ambulatory Visit | Attending: General Surgery

## 2016-10-04 ENCOUNTER — Encounter (HOSPITAL_BASED_OUTPATIENT_CLINIC_OR_DEPARTMENT_OTHER): Payer: Self-pay

## 2016-10-04 ENCOUNTER — Ambulatory Visit (HOSPITAL_BASED_OUTPATIENT_CLINIC_OR_DEPARTMENT_OTHER): Payer: Medicare Other | Admitting: Anesthesiology

## 2016-10-04 ENCOUNTER — Ambulatory Visit (HOSPITAL_BASED_OUTPATIENT_CLINIC_OR_DEPARTMENT_OTHER)
Admission: RE | Admit: 2016-10-04 | Discharge: 2016-10-04 | Disposition: A | Discharge status: Home / Self Care | Payer: Medicare Other | Source: Ambulatory Visit | Attending: General Surgery | Admitting: General Surgery

## 2016-10-04 DIAGNOSIS — Z7982 Long term (current) use of aspirin: Secondary | ICD-10-CM | POA: Insufficient documentation

## 2016-10-04 DIAGNOSIS — Z888 Allergy status to other drugs, medicaments and biological substances status: Secondary | ICD-10-CM | POA: Diagnosis not present

## 2016-10-04 DIAGNOSIS — C187 Malignant neoplasm of sigmoid colon: Secondary | ICD-10-CM | POA: Diagnosis not present

## 2016-10-04 DIAGNOSIS — F17210 Nicotine dependence, cigarettes, uncomplicated: Secondary | ICD-10-CM | POA: Insufficient documentation

## 2016-10-04 DIAGNOSIS — N400 Enlarged prostate without lower urinary tract symptoms: Secondary | ICD-10-CM | POA: Diagnosis not present

## 2016-10-04 DIAGNOSIS — Z880 Allergy status to penicillin: Secondary | ICD-10-CM | POA: Insufficient documentation

## 2016-10-04 DIAGNOSIS — Z791 Long term (current) use of non-steroidal anti-inflammatories (NSAID): Secondary | ICD-10-CM | POA: Insufficient documentation

## 2016-10-04 DIAGNOSIS — C189 Malignant neoplasm of colon, unspecified: Secondary | ICD-10-CM | POA: Insufficient documentation

## 2016-10-04 DIAGNOSIS — Z452 Encounter for adjustment and management of vascular access device: Secondary | ICD-10-CM | POA: Insufficient documentation

## 2016-10-04 DIAGNOSIS — J449 Chronic obstructive pulmonary disease, unspecified: Secondary | ICD-10-CM | POA: Diagnosis not present

## 2016-10-04 DIAGNOSIS — C772 Secondary and unspecified malignant neoplasm of intra-abdominal lymph nodes: Secondary | ICD-10-CM | POA: Diagnosis not present

## 2016-10-04 DIAGNOSIS — Z79899 Other long term (current) drug therapy: Secondary | ICD-10-CM | POA: Insufficient documentation

## 2016-10-04 HISTORY — PX: PORT-A-CATH REMOVAL: SHX5289

## 2016-10-04 SURGERY — REMOVAL PORT-A-CATH
Anesthesia: Monitor Anesthesia Care | Site: Chest | Laterality: Right

## 2016-10-04 MED ORDER — FENTANYL CITRATE (PF) 100 MCG/2ML IJ SOLN
INTRAMUSCULAR | Status: DC | PRN
Start: 2016-10-04 — End: 2016-10-04
  Administered 2016-10-04 (×8): 12.5 ug via INTRAVENOUS

## 2016-10-04 MED ORDER — ONDANSETRON HCL 4 MG/2ML IJ SOLN
INTRAMUSCULAR | Status: AC
  Filled 2016-10-04: qty 2

## 2016-10-04 MED ORDER — FENTANYL CITRATE (PF) 100 MCG/2ML IJ SOLN
25.0000 ug | INTRAMUSCULAR | Status: DC | PRN
  Filled 2016-10-04: qty 1

## 2016-10-04 MED ORDER — FENTANYL CITRATE (PF) 100 MCG/2ML IJ SOLN
INTRAMUSCULAR | Status: AC
  Filled 2016-10-04: qty 2

## 2016-10-04 MED ORDER — PROPOFOL 500 MG/50ML IV EMUL
INTRAVENOUS | Status: AC
  Filled 2016-10-04: qty 50

## 2016-10-04 MED ORDER — CLINDAMYCIN PHOSPHATE 600 MG/50ML IV SOLN
600.0000 mg | INTRAVENOUS | Status: AC
  Administered 2016-10-04: 600 mg via INTRAVENOUS
  Filled 2016-10-04: qty 50

## 2016-10-04 MED ORDER — ACETAMINOPHEN 325 MG PO TABS
650.0000 mg | ORAL_TABLET | ORAL | Status: DC | PRN
  Filled 2016-10-04: qty 2

## 2016-10-04 MED ORDER — BUPIVACAINE-EPINEPHRINE 0.5% -1:200000 IJ SOLN
INTRAMUSCULAR | Status: DC | PRN
  Administered 2016-10-04: 11 mL

## 2016-10-04 MED ORDER — PROPOFOL 500 MG/50ML IV EMUL
INTRAVENOUS | Status: DC | PRN
  Administered 2016-10-04: 25 ug/kg/min via INTRAVENOUS

## 2016-10-04 MED ORDER — MIDAZOLAM HCL 2 MG/2ML IJ SOLN
INTRAMUSCULAR | Status: AC
  Filled 2016-10-04: qty 2

## 2016-10-04 MED ORDER — SODIUM CHLORIDE 0.9% FLUSH
3.0000 mL | Freq: Two times a day (BID) | INTRAVENOUS | Status: DC
  Filled 2016-10-04: qty 3

## 2016-10-04 MED ORDER — LIDOCAINE HCL (CARDIAC) 20 MG/ML IV SOLN
INTRAVENOUS | Status: DC | PRN
Start: 2016-10-04 — End: 2016-10-04
  Administered 2016-10-04: 40 mg via INTRAVENOUS

## 2016-10-04 MED ORDER — ONDANSETRON HCL 4 MG/2ML IJ SOLN
INTRAMUSCULAR | Status: DC | PRN
  Administered 2016-10-04: 4 mg via INTRAVENOUS

## 2016-10-04 MED ORDER — DEXAMETHASONE SODIUM PHOSPHATE 4 MG/ML IJ SOLN
INTRAMUSCULAR | Status: DC | PRN
  Administered 2016-10-04: 5 mg via INTRAVENOUS

## 2016-10-04 MED ORDER — SODIUM CHLORIDE 0.9% FLUSH
3.0000 mL | INTRAVENOUS | Status: DC | PRN
  Filled 2016-10-04: qty 3

## 2016-10-04 MED ORDER — ACETAMINOPHEN 650 MG RE SUPP
650.0000 mg | RECTAL | Status: DC | PRN
  Filled 2016-10-04: qty 1

## 2016-10-04 MED ORDER — CLINDAMYCIN PHOSPHATE 600 MG/50ML IV SOLN
INTRAVENOUS | Status: AC
  Filled 2016-10-04: qty 50

## 2016-10-04 MED ORDER — ONDANSETRON HCL 4 MG/2ML IJ SOLN
4.0000 mg | Freq: Four times a day (QID) | INTRAMUSCULAR | Status: DC | PRN
  Filled 2016-10-04: qty 2

## 2016-10-04 MED ORDER — MIDAZOLAM HCL 5 MG/5ML IJ SOLN
INTRAMUSCULAR | Status: DC | PRN
  Administered 2016-10-04: 2 mg via INTRAVENOUS

## 2016-10-04 MED ORDER — OXYCODONE HCL 5 MG/5ML PO SOLN
5.0000 mg | Freq: Once | ORAL | Status: DC | PRN
  Filled 2016-10-04: qty 5

## 2016-10-04 MED ORDER — LIDOCAINE 2% (20 MG/ML) 5 ML SYRINGE
INTRAMUSCULAR | Status: AC
  Filled 2016-10-04: qty 5

## 2016-10-04 MED ORDER — SODIUM CHLORIDE 0.9 % IV SOLN
250.0000 mL | INTRAVENOUS | Status: DC | PRN
  Filled 2016-10-04: qty 250

## 2016-10-04 MED ORDER — LACTATED RINGERS IV SOLN
INTRAVENOUS | Status: DC
  Administered 2016-10-04: 1000 mL via INTRAVENOUS
  Filled 2016-10-04: qty 1000

## 2016-10-04 MED ORDER — OXYCODONE HCL 5 MG PO TABS
5.0000 mg | ORAL_TABLET | Freq: Once | ORAL | Status: DC | PRN
  Filled 2016-10-04: qty 1

## 2016-10-04 SURGICAL SUPPLY — 35 items
BLADE CLIPPER SURG (BLADE) IMPLANT
BLADE HEX COATED 2.75 (ELECTRODE) ×2 IMPLANT
BLADE SURG 15 STRL LF DISP TIS (BLADE) ×1 IMPLANT
BLADE SURG 15 STRL SS (BLADE) ×1
CANISTER SUCT 1200ML W/VALVE (MISCELLANEOUS) IMPLANT
CHLORAPREP W/TINT 26ML (MISCELLANEOUS) ×4 IMPLANT
COVER BACK TABLE 60X90IN (DRAPES) ×2 IMPLANT
COVER MAYO STAND STRL (DRAPES) ×2 IMPLANT
DECANTER SPIKE VIAL GLASS SM (MISCELLANEOUS) IMPLANT
DERMABOND ADVANCED (GAUZE/BANDAGES/DRESSINGS) ×1
DERMABOND ADVANCED .7 DNX12 (GAUZE/BANDAGES/DRESSINGS) ×1 IMPLANT
DRAPE LAPAROTOMY TRNSV 102X78 (DRAPE) ×2 IMPLANT
DRAPE UTILITY XL STRL (DRAPES) ×2 IMPLANT
ELECT REM PT RETURN 9FT ADLT (ELECTROSURGICAL) ×2
ELECTRODE REM PT RTRN 9FT ADLT (ELECTROSURGICAL) ×1 IMPLANT
GLOVE BIO SURGEON STRL SZ 6.5 (GLOVE) ×2 IMPLANT
GLOVE BIOGEL PI IND STRL 7.0 (GLOVE) ×1 IMPLANT
GLOVE BIOGEL PI INDICATOR 7.0 (GLOVE) ×1
GOWN STRL REUS W/ TWL LRG LVL3 (GOWN DISPOSABLE) ×1 IMPLANT
GOWN STRL REUS W/TWL 2XL LVL3 (GOWN DISPOSABLE) ×2 IMPLANT
GOWN STRL REUS W/TWL LRG LVL3 (GOWN DISPOSABLE) ×1
NEEDLE HYPO 25X1 1.5 SAFETY (NEEDLE) ×2 IMPLANT
NS IRRIG 500ML POUR BTL (IV SOLUTION) IMPLANT
PACK BASIN DAY SURGERY FS (CUSTOM PROCEDURE TRAY) ×2 IMPLANT
PENCIL BUTTON HOLSTER BLD 10FT (ELECTRODE) ×2 IMPLANT
SLEEVE SCD COMPRESS KNEE MED (MISCELLANEOUS) IMPLANT
SPONGE GAUZE 4X4 12PLY STER LF (GAUZE/BANDAGES/DRESSINGS) ×2 IMPLANT
SPONGE LAP 4X18 X RAY DECT (DISPOSABLE) ×2 IMPLANT
SUT VIC AB 3-0 SH 27 (SUTURE) ×1
SUT VIC AB 3-0 SH 27X BRD (SUTURE) ×1 IMPLANT
SUT VICRYL 4-0 PS2 18IN ABS (SUTURE) ×2 IMPLANT
SYR CONTROL 10ML LL (SYRINGE) ×2 IMPLANT
TOWEL OR 17X24 6PK STRL BLUE (TOWEL DISPOSABLE) ×2 IMPLANT
TUBE CONNECTING 12X1/4 (SUCTIONS) IMPLANT
YANKAUER SUCT BULB TIP NO VENT (SUCTIONS) IMPLANT

## 2016-10-04 NOTE — Transfer of Care (Signed)
Immediate Anesthesia Transfer of Care Note  Patient: Mario Mcdowell  Procedure(s) Performed: Procedure(s) (LRB): REMOVAL PORT-A-CATH (Right)  Patient Location: PACU  Anesthesia Type: General  Level of Consciousness: awake, sedated, patient cooperative and responds to stimulation  Airway & Oxygen Therapy: Patient Spontanous Breathing and Patient connected to NA  Post-op Assessment: Report given to PACU RN, Post -op Vital signs reviewed and stable and Patient moving all extremities  Post vital signs: Reviewed and stable  Complications: No apparent anesthesia complications

## 2016-10-04 NOTE — Discharge Instructions (Addendum)
GENERAL SURGERY: POST OP INSTRUCTIONS ° °1. DIET: Follow a light bland diet the first 24 hours after arrival home, such as soup, liquids, crackers, etc.  Be sure to include lots of fluids daily.  Avoid fast food or heavy meals as your are more likely to get nauseated.   °2. Take your usually prescribed home medications unless otherwise directed. °3. PAIN CONTROL: °a. Pain is best controlled by a usual combination of three different methods TOGETHER: °i. Ice/Heat °ii. Over the counter pain medication °iii. Prescription pain medication °b. Most patients will experience some swelling and bruising around the incisions.  Ice packs or heating pads (30-60 minutes up to 6 times a day) will help. Use ice for the first few days to help decrease swelling and bruising, then switch to heat to help relax tight/sore spots and speed recovery.  Some people prefer to use ice alone, heat alone, alternating between ice & heat.  Experiment to what works for you.  Swelling and bruising can take several weeks to resolve.   °c. It is helpful to take an over-the-counter pain medication regularly for the first few weeks.  Choose one of the following that works best for you: °i. Naproxen (Aleve, etc)  Two 220mg tabs twice a day °ii. Ibuprofen (Advil, etc) Three 200mg tabs four times a day (every meal & bedtime) °d. A  prescription for pain medication (such as Percocet, oxycodone, hydrocodone, etc) should be given to you upon discharge.  Take your pain medication as prescribed.  °i. If you are having problems/concerns with the prescription medicine (does not control pain, nausea, vomiting, rash, itching, etc), please call us [phone] to see if we need to switch you to a different pain medicine that will work better for you and/or control your side effect better. °ii. If you need a refill on your pain medication, please contact your pharmacy.  They will contact our office to request authorization. Prescriptions will not be filled after 5  pm or on week-ends. °4. Avoid getting constipated.  Between the surgery and the pain medications, it is common to experience some constipation.  Increasing fluid intake and taking a fiber supplement (such as Metamucil, Citrucel, FiberCon, MiraLax, etc) 1-2 times a day regularly will usually help prevent this problem from occurring.  A mild laxative (prune juice, Milk of Magnesia, MiraLax, etc) should be taken according to package directions if there are no bowel movements after 48 hours.   °5. Wash / shower every day.  You may shower over the dressings as they are waterproof.  Continue to shower over incision(s) after the dressing is off. °6. Remove your waterproof bandages 5 days after surgery.  You may leave the incision open to air.  You may have skin tapes (Steri Strips) covering the incision(s).  Leave them on until one week, then remove.  You may replace a dressing/Band-Aid to cover the incision for comfort if you wish.  ° ° ° ° °7. ACTIVITIES as tolerated:   °a. You may resume regular (light) daily activities beginning the next day--such as daily self-care, walking, climbing stairs--gradually increasing activities as tolerated.  If you can walk 30 minutes without difficulty, it is safe to try more intense activity such as jogging, treadmill, bicycling, low-impact aerobics, swimming, etc. °b. Save the most intensive and strenuous activity for last such as sit-ups, heavy lifting, contact sports, etc  Refrain from any heavy lifting or straining until you are off narcotics for pain control.   °c. DO NOT PUSH   THROUGH PAIN.  Let pain be your guide: If it hurts to do something, don't do it.  Pain is your body warning you to avoid that activity for another week until the pain goes down. °d. You may drive when you are no longer taking prescription pain medication, you can comfortably wear a seatbelt, and you can safely maneuver your car and apply brakes. °e. You may have sexual intercourse when it is comfortable.   °8. FOLLOW UP in our office °a. Please call [facility] at [phone] to set up an appointment to see your surgeon in the office for a follow-up appointment approximately 2-3 weeks after your surgery. °b. Make sure that you call for this appointment the day you arrive home to insure a convenient appointment time. °9. IF YOU HAVE DISABILITY OR FAMILY LEAVE FORMS, BRING THEM TO THE OFFICE FOR PROCESSING.  DO NOT GIVE THEM TO YOUR DOCTOR. ° ° °WHEN TO CALL US [phone]: °1. Poor pain control °2. Reactions / problems with new medications (rash/itching, nausea, etc)  °3. Fever over 101.5 F (38.5 C) °4. Worsening swelling or bruising °5. Continued bleeding from incision. °6. Increased pain, redness, or drainage from the incision ° ° The clinic staff is available to answer your questions during regular business hours (8:30am-5pm).  Please don’t hesitate to call and ask to speak to one of our nurses for clinical concerns.  ° If you have a medical emergency, go to the nearest emergency room or call 911. ° A surgeon from [facility] is always on call at the hospitals ° ° °[facility] °[address], [address], [place], [place]  [address] ? °MAIN: [phone] ? TOLL FREE: [phone] ?  °FAX [phone] °[email or web address] ° ° ° °Post Anesthesia Home Care Instructions ° °Activity: °Get plenty of rest for the remainder of the day. A responsible individual must stay with you for 24 hours following the procedure.  °For the next 24 hours, DO NOT: °-Drive a car °-Operate machinery °-Drink alcoholic beverages °-Take any medication unless instructed by your physician °-Make any legal decisions or sign important papers. ° °Meals: °Start with liquid foods such as gelatin or soup. Progress to regular foods as tolerated. Avoid greasy, spicy, heavy foods. If nausea and/or vomiting occur, drink only clear liquids until the nausea and/or vomiting subsides. Call your physician if  vomiting continues. ° °Special Instructions/Symptoms: °Your throat may feel dry or sore from the anesthesia or the breathing tube placed in your throat during surgery. If this causes discomfort, gargle with warm salt water. The discomfort should disappear within 24 hours. ° °If you had a scopolamine patch placed behind your ear for the management of post- operative nausea and/or vomiting: ° °1. The medication in the patch is effective for 72 hours, after which it should be removed.  Wrap patch in a tissue and discard in the trash. Wash hands thoroughly with soap and water. °2. You may remove the patch earlier than 72 hours if you experience unpleasant side effects which may include dry mouth, dizziness or visual disturbances. °3. Avoid touching the patch. Wash your hands with soap and water after contact with the patch. °  ° ° ° °

## 2016-10-04 NOTE — Op Note (Signed)
10/04/2016  2:24 PM  PATIENT:  Mario Mcdowell  66 y.o. male  Patient Care Team: Ria Bush, MD as PCP - General (Family Medicine) Ladene Artist, MD as Consulting Physician (Gastroenterology) [name], MD as Consulting Physician (General Surgery) Truitt Merle, MD as Consulting Physician (Hematology)  PRE-OPERATIVE DIAGNOSIS:  COLON CANCER STAGE 3, Port in place  POST-OPERATIVE DIAGNOSIS:  COLON CANCER STAGE 3, Port in place  PROCEDURE:   REMOVAL PORT-A-CATH  SURGEON:  Surgeon(s): [name], MD  ASSISTANT: none   ANESTHESIA:   local and MAC  EBL:  Total I/O In: 300 [I.V.:300] Out: 0   DRAINS: none   SPECIMEN:  Source of Specimen:  port  DISPOSITION OF SPECIMEN:  PATHOLOGY  COUNTS:  YES  PLAN OF CARE: Discharge to home after PACU  PATIENT DISPOSITION:  PACU - hemodynamically stable.  INDICATION: 66 y.o. M s/p completion of adjuvant chemotherapy    OR FINDINGS: intact port  DESCRIPTION: the patient was identified in the preoperative holding area and taken to the OR where they were laid supine on the operating room table.  MAC anesthesia was induced without difficulty. SCDs were also noted to be in place prior to the initiation of anesthesia.  The patient was then prepped and draped in the usual sterile fashion.   A surgical timeout was performed indicating the correct patient, procedure, positioning and need for preoperative antibiotics.   I made an incision in the skin using a 15 blade scalpel and incised the previous scar.  Dissection was carried down through the subcutaneous fascia and over top of the port. The capsule was entered and the port was loosened from the capsule. The 2 Prolene sutures were cut. A figure-of-eight 3-0 Vicryl suture was placed at the exit site and the port was removed intact and the Vicryl suture was tied tightly around this. The remaining capsule was closed using a running 3-0 Vicryl suture. The skin was closed using a running  4-0 Vicryl suture and Dermabond. All counts were correct per operating room staff. The patient tolerated procedure well and sent to the postanesthesia care unit in stable condition.

## 2016-10-04 NOTE — H&P (Addendum)
66 y.o. M who is s/p chemotherapy for stage 3 colon cancer.  He has completed this and is ready for his port to be removed.  Past Medical History:  Diagnosis Date  . Aorto-iliac atherosclerosis ([place])    by CT; in bilateral legs   . Arthritis   . Atherosclerotic PVD with intermittent claudication ([place])   . BPH without obstruction/lower urinary tract symptoms 10/25/2015  . Centrilobular emphysema ([place])    per chest CT 04-23-2016  . Colon adenocarcinoma [facility]) oncologist- dr Burr Medico   dx 05-21-2016 -- Stage IIIB (pT3,pN1,M0) , Invasive sigmoid adenocarcinoma well to moderately differentiated with metastatic intra-abdominal lymph node (1 out of 22), s/p lap. partial colectomy [name]) 05/21/2016 and chemotherapy is planned  . Full dentures   . History of kidney stones 07/2015   passed without intervention  . Pulmonary nodules    per chest CT 04-23-2016 bilateral nodules (consistent w/ granulomatous disease)  . Smokers' cough ([place])   . Wears contact lenses    Past Surgical History:  Procedure Laterality Date  . COLONOSCOPY  03/12/2016   4 polyps, sigmoid tumor biopsied (TVA w/ MICROSCOPIC FOCUS SUSPICIOUS FOR INVASION) pending surgical resection, rpt 1 yr Fuller Plan)  . LAPAROSCOPIC PARTIAL COLECTOMY N/A 05/21/2016   colon cancer, 1/20 positive lymp nodes. LAPAROSCOPIC PARTIAL COLECTOMY, SPLENIC FLEXURE MOBILIZATION AND PROCTOSCOPY;  [name], MD)  . MULTIPLE TOOTH EXTRACTIONS  1980's  . PORTACATH PLACEMENT N/A 06/21/2016   Procedure: INSERTION PORT-A-CATH;  Surgeon: [name], MD;  Location: [facility];  Service: General;  Laterality: N/A;  right   Family History  Problem Relation Age of Onset  . Cancer Paternal Aunt        unsure details  . Diabetes Cousin   . Cancer Paternal Uncle        unknow type cancer   . Prostate cancer Neg Hx   . Kidney cancer Neg Hx   . CAD Neg Hx   . Stroke Neg Hx   . Colon cancer Neg Hx    Social History   Social History  .  Marital status: Married    Spouse name: N/A  . Number of children: N/A  . Years of education: N/A   Occupational History  . Not on file.   Social History Main Topics  . Smoking status: Current Every Day Smoker    Packs/day: 0.50    Years: 46.00    Types: Cigarettes  . Smokeless tobacco: Never Used     Comment: trying to quit -reduced from 1.5ppd  . Alcohol use No     Comment: quit 2000  . Drug use: No  . Sexual activity: No   Other Topics Concern  . Not on file   Social History Narrative   Lives with wife, adopted daughter, adopted son   Occ: carpenter   Edu: 8th grade    No outpatient prescriptions have been marked as taking for the 10/04/16 encounter [facility] Encounter).   Allergies  Allergen Reactions  . Penicillins Swelling    Has patient had a PCN reaction causing immediate rash, facial/tongue/throat swelling, SOB or lightheadedness with hypotension: Yes Has patient had a PCN reaction causing severe rash involving mucus membranes or skin necrosis: No Has patient had a PCN reaction that required hospitalization No Has patient had a PCN reaction occurring within the last 10 years: No If all of the above answers are "NO", then may proceed with Cephalosporin use.   . Lipitor [Atorvastatin] Nausea Only   Review of  Systems - General ROS: negative for - chills or fever Respiratory ROS: no cough, shortness of breath, or wheezing Cardiovascular ROS: no chest pain or dyspnea on exertion Gastrointestinal ROS: no abdominal pain, change in bowel habits, or black or bloody stools Genito-Urinary ROS: no dysuria, trouble voiding, or hematuria   Physical Exam  Constitutional: He is oriented to person, place, and time. He appears well-developed and well-nourished.  HENT:  Head: Normocephalic and atraumatic.  Eyes: Pupils are equal, round, and reactive to light. EOM are normal.  Neck: Normal range of motion. Neck supple.  Cardiovascular: Normal rate and regular rhythm.    Pulmonary/Chest: Effort normal and breath sounds normal.  Abdominal: Soft. He exhibits no distension. There is no tenderness.  Musculoskeletal: Normal range of motion.  Neurological: He is alert and oriented to person, place, and time.  Skin: Skin is warm and dry.   Assessment: Port in place  Plan: remove port in OR under MAC.  Risks include bleeding, infection and device dysfunction.  I believe the patient understands this and agrees to proceed.

## 2016-10-04 NOTE — Anesthesia Postprocedure Evaluation (Signed)
Anesthesia Post Note  Patient: Mario Mcdowell  Procedure(s) Performed: Procedure(s) (LRB): REMOVAL PORT-A-CATH (Right)     Patient location during evaluation: PACU Anesthesia Type: MAC Level of consciousness: awake Pain management: pain level controlled Vital Signs Assessment: post-procedure vital signs reviewed and stable Respiratory status: spontaneous breathing Cardiovascular status: stable Anesthetic complications: no    Last Vitals:  Vitals:   10/04/16 1430 10/04/16 1445  BP: 105/60   Pulse: (!) 59 (!) 57  Resp: 17 12  Temp: (!) 36.1 C   SpO2: 95% 94%    Last Pain:  Vitals:   10/04/16 1230  TempSrc: Oral                 Isolde Skaff

## 2016-10-04 NOTE — Anesthesia Procedure Notes (Deleted)
Performed by: Justice Rocher

## 2016-10-04 NOTE — Anesthesia Preprocedure Evaluation (Signed)
Anesthesia Evaluation  Patient identified by MRN, date of birth, ID band Patient awake    Reviewed: Allergy & Precautions, H&P , NPO status , Patient's Chart, lab work & pertinent test results  Airway Mallampati: II   Neck ROM: full    Dental   Pulmonary COPD, Current Smoker,    breath sounds clear to auscultation       Cardiovascular + Peripheral Vascular Disease   Rhythm:regular Rate:Normal     Neuro/Psych    GI/Hepatic   Endo/Other    Renal/GU stones     Musculoskeletal  (+) Arthritis ,   Abdominal   Peds  Hematology   Anesthesia Other Findings   Reproductive/Obstetrics                             Anesthesia Physical Anesthesia Plan  ASA: III  Anesthesia Plan: MAC   Post-op Pain Management:    Induction: Intravenous  PONV Risk Score and Plan: 0 and Propofol infusion, Midazolam and Ondansetron  Airway Management Planned: Simple Face Mask  Additional Equipment:   Intra-op Plan:   Post-operative Plan:   Informed Consent: I have reviewed the patients History and Physical, chart, labs and discussed the procedure including the risks, benefits and alternatives for the proposed anesthesia with the patient or authorized representative who has indicated his/her understanding and acceptance.     Plan Discussed with: CRNA, Anesthesiologist and Surgeon  Anesthesia Plan Comments:         Anesthesia Quick Evaluation

## 2016-10-04 NOTE — Anesthesia Procedure Notes (Signed)
Procedure Name: MAC Date/Time: 10/04/2016 2:00 PM Performed by: Justice Rocher Pre-anesthesia Checklist: Patient identified, Emergency Drugs available, Suction available, Patient being monitored and Timeout performed Patient Re-evaluated:Patient Re-evaluated prior to induction Oxygen Delivery Method: Simple face mask Preoxygenation: Pre-oxygenation with 100% oxygen Induction Type: IV induction Placement Confirmation: positive ETCO2 and breath sounds checked- equal and bilateral

## 2016-10-05 ENCOUNTER — Encounter (HOSPITAL_BASED_OUTPATIENT_CLINIC_OR_DEPARTMENT_OTHER): Payer: Self-pay | Admitting: General Surgery

## 2016-10-05 LAB — POCT HEMOGLOBIN-HEMACUE: Hemoglobin: 13.4 g/dL (ref 13.0–17.0)

## 2016-11-08 ENCOUNTER — Ambulatory Visit (INDEPENDENT_AMBULATORY_CARE_PROVIDER_SITE_OTHER): Payer: Medicare Other | Admitting: Family Medicine

## 2016-11-08 ENCOUNTER — Encounter: Payer: Self-pay | Admitting: Family Medicine

## 2016-11-08 VITALS — BP 118/66 | HR 66 | Temp 97.8°F | Wt 116.5 lb

## 2016-11-08 DIAGNOSIS — J208 Acute bronchitis due to other specified organisms: Secondary | ICD-10-CM

## 2016-11-08 DIAGNOSIS — F17200 Nicotine dependence, unspecified, uncomplicated: Secondary | ICD-10-CM | POA: Diagnosis not present

## 2016-11-08 DIAGNOSIS — C772 Secondary and unspecified malignant neoplasm of intra-abdominal lymph nodes: Secondary | ICD-10-CM

## 2016-11-08 DIAGNOSIS — C187 Malignant neoplasm of sigmoid colon: Secondary | ICD-10-CM

## 2016-11-08 DIAGNOSIS — B9689 Other specified bacterial agents as the cause of diseases classified elsewhere: Secondary | ICD-10-CM | POA: Insufficient documentation

## 2016-11-08 MED ORDER — AZITHROMYCIN 250 MG PO TABS: ORAL_TABLET | ORAL | 0 refills | Status: DC

## 2016-11-08 NOTE — Patient Instructions (Signed)
I think you have bronchitis - cover with zpack antibiotic.  Push fluids and rest.  Let us know if not improving with treatment, or if fever >101 or worsening productive cough.

## 2016-11-08 NOTE — Assessment & Plan Note (Addendum)
Anticipate acute bronchitis. Given chemo hx, cover bacterial /atypical sources with zpack antibiotic. Further supportive care reviewed. Update if not improving with treatment. Pt agrees with plan.

## 2016-11-08 NOTE — Assessment & Plan Note (Signed)
Continue to encourage cessation. Precontemplative.  ?

## 2016-11-08 NOTE — Progress Notes (Signed)
BP 118/66 (BP Location: Left Arm, Patient Position: Sitting, Cuff Size: Normal)   Pulse 66   Temp 97.8 F (36.6 C) (Oral)   Wt 116 lb 8 oz (52.8 kg)   SpO2 96%   BMI 19.39 kg/m    CC: multiple issues predominantly congestion/cough Subjective:    Patient ID: Pepper Wyndham, male    DOB: [birth year], 66 y.o.   MRN: [id number]  HPI: Jamare Vanatta is a 66 y.o. male presenting on 11/08/2016 for Fatigue (says last chemo tx was 08/2016. Not sure if that is the cause); Hip Pain (Started 10/31/16); Knee Pain; Diarrhea (off and on for past week); and Cough (productive. Has taken Mucinex, barely helpful)   1+ wk h/o productive cough, nasal congestion, intermittent diarrhea, fatigue, malaise. + post nasal drainage and chest congestion. He is sleeping well.   No fevers/chills, ear or tooth pain, ST, headaches, dyspnea or wheezing.  Treating with mucinex without significant relief.  Did not yet get flu shot.  Current smoker 1/2 ppd.  Known COPD.   Known metastatic colon cancer over doing well, recently had port removed. S/p partial colectomy, finished folfox chemo 08/2016.   Relevant past medical, surgical, family and social history reviewed and updated as indicated. Interim medical history since our last visit reviewed. Allergies and medications reviewed and updated. Outpatient Medications Prior to Visit  Medication Sig Dispense Refill  . acetaminophen (TYLENOL) 500 MG chewable tablet Chew 500 mg by mouth as needed for pain.    Marland Kitchen aspirin EC 81 MG tablet Take 1 tablet (81 mg total) by mouth daily.    Marland Kitchen lidocaine-prilocaine (EMLA) cream Apply to affected area once 30 g 3  . promethazine (PHENERGAN) 25 MG tablet TAKE 1 TABLET BY MOUTH EVERY 4 TO 6 HOURS AS NEEDED FOR NAUSEA 20 tablet 1  . traZODone (DESYREL) 50 MG tablet Take 0.5-1 tablets (25-50 mg total) by mouth at bedtime as needed for sleep. 30 tablet 3   No facility-administered medications prior to visit.      Per HPI unless specifically  indicated in ROS section below Review of Systems     Objective:    BP 118/66 (BP Location: Left Arm, Patient Position: Sitting, Cuff Size: Normal)   Pulse 66   Temp 97.8 F (36.6 C) (Oral)   Wt 116 lb 8 oz (52.8 kg)   SpO2 96%   BMI 19.39 kg/m   Wt Readings from Last 3 Encounters:  11/08/16 116 lb 8 oz (52.8 kg)  10/04/16 116 lb (52.6 kg)  09/04/16 118 lb (53.5 kg)    Physical Exam  Constitutional: He appears well-developed and well-nourished. No distress.  HENT:  Head: Normocephalic and atraumatic.  Right Ear: Hearing, tympanic membrane, external ear and ear canal normal.  Left Ear: Hearing, tympanic membrane, external ear and ear canal normal.  Nose: Nose normal. No mucosal edema or rhinorrhea. Right sinus exhibits no maxillary sinus tenderness and no frontal sinus tenderness. Left sinus exhibits no maxillary sinus tenderness and no frontal sinus tenderness.  Mouth/Throat: Uvula is midline, oropharynx is clear and moist and mucous membranes are normal. No oropharyngeal exudate, posterior oropharyngeal edema, posterior oropharyngeal erythema or tonsillar abscesses.  Eyes: Pupils are equal, round, and reactive to light. Conjunctivae and EOM are normal. No scleral icterus.  Neck: Normal range of motion. Neck supple.  Cardiovascular: Normal rate, regular rhythm, normal heart sounds and intact distal pulses.   No murmur heard. Pulmonary/Chest: Effort normal and breath sounds normal. No respiratory distress. He has  no wheezes. He has no rales.  Lungs overall clear  Lymphadenopathy:    He has no cervical adenopathy.  Skin: Skin is warm and dry. No rash noted.  Nursing note and vitals reviewed.  Results for orders placed or performed during the hospital encounter of 10/04/16  Hemoglobin-hemacue, POC  Result Value Ref Range   Hemoglobin 13.4 13.0 - 17.0 g/dL      Assessment & Plan:   Problem List Items Addressed This Visit    Acute bacterial bronchitis - Primary    Anticipate  acute bronchitis. Given chemo hx, cover bacterial /atypical sources with zpack antibiotic. Further supportive care reviewed. Update if not improving with treatment. Pt agrees with plan.       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 Precontemplative.           Follow up plan: Return if symptoms worsen or fail to improve.  Ria Bush, MD

## 2016-11-22 ENCOUNTER — Ambulatory Visit (INDEPENDENT_AMBULATORY_CARE_PROVIDER_SITE_OTHER): Payer: Medicare Other

## 2016-11-22 ENCOUNTER — Encounter (INDEPENDENT_AMBULATORY_CARE_PROVIDER_SITE_OTHER): Payer: Self-pay | Admitting: Orthopaedic Surgery

## 2016-11-22 ENCOUNTER — Ambulatory Visit (INDEPENDENT_AMBULATORY_CARE_PROVIDER_SITE_OTHER): Payer: Medicare Other | Admitting: Orthopaedic Surgery

## 2016-11-22 VITALS — BP 147/80 | HR 64 | Resp 12 | Ht 65.0 in | Wt 116.0 lb

## 2016-11-22 DIAGNOSIS — M79604 Pain in right leg: Secondary | ICD-10-CM

## 2016-11-22 DIAGNOSIS — M79605 Pain in left leg: Secondary | ICD-10-CM

## 2016-11-22 DIAGNOSIS — M174 Other bilateral secondary osteoarthritis of knee: Secondary | ICD-10-CM | POA: Diagnosis not present

## 2016-11-22 NOTE — Addendum Note (Signed)
Addended by: Mervyn Skeeters on: 11/22/2016 05:06 PM   Modules accepted: Orders

## 2016-11-22 NOTE — Progress Notes (Signed)
Office Visit Note   Patient: Mario Mcdowell           Date of Birth: Jun 15, 1950           MRN: [id number] Visit Date: 11/22/2016              Requested by: Ria Bush, MD [place], [place] [address] PCP: Ria Bush, MD   Assessment & Plan: Visit Diagnoses:  1. Pain in left leg   2. Pain in right leg     Plan: Bilateral lower extremity pain appears to be "worsening" over the last several months. He is diffuse calcification of the abdominal aorta and femoral arteries. I'd like to obtain a vascular consult to be sure this is not related to a vascular insufficiency. Also will obtain an MRI scan of the lumbar spine. This could represent neurogenic claudication. See back after the MRI scan. Consider EMGs and nerve conduction studies with the possibility of a neuropathy related to his chemotherapy total office time was approximately 40 minutes in discussing diagnostic possibilities and treatment options.  Orders:  Orders Placed This Encounter  Procedures  . XR Lumbar Spine 2-3 Views  . XR Pelvis 1-2 Views   No orders of the defined types were placed in this encounter.     Procedures: No procedures performed   Clinical Data: No additional findings.   Subjective: Chief Complaint  Patient presents with  . Right Foot - Numbness    Mario Mcdowell is a 66 y o that presents with bilateral foot pain with bunions on both,burning/stinging pain on plantar since 3 months. Hx of colon cancer, chemo (last one August 2018). The pain radiates now from buttocks to feet.  . Left Foot - Numbness  Bilateral lower extremity pain with insidious in onset. Was experiencing some "foot" pain associated with numbness and tingling prior to his chemotherapy for colon cancer. During and after his chemotherapy he developed more pain in his legs and lateral hips. Now experiencing trouble when he does any heavy work., Sits for a length of time, or does any heavy lifting. Chemotherapy was  completed at the end of August. He feels quite compromised in his activities. Not experiencing groin pain. Not having any specific back pain but does have some buttock and lateral hip joint discomfort. Does smoke. Has not discussed with his oncologist but has an appointment in the next several weeks.  HPI  Review of Systems   Objective: Vital Signs: BP (!) 147/80   Pulse 64   Resp 12   Ht 5\' 5"  (1.651 m)   Wt 116 lb (52.6 kg)   BMI 19.30 kg/m   Physical Exam  Ortho Exam awake alert and oriented 3. Comfortable sitting. Straight leg raise negative bilaterally. No percussible tenderness of lumbar spine. This range of motion both hips with internal/external rotation. No pain along either greater trochanter or buttock. Minimal pulses at ankles. Feet warm. Motor exam intact. Sensory exam intact  Specialty Comments:  No specialty comments available.  Imaging: No results found.   PMFS History: Patient Active Problem List   Diagnosis Date Noted  . Acute bacterial bronchitis 11/08/2016  . Insomnia 07/24/2016  . Port catheter in place 07/23/2016  . 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 Welcome to Medicare preventive visit 01/06/2016  . Advanced care planning/counseling discussion 01/06/2016  . HLD (hyperlipidemia) 01/06/2016  . Corns and callus 01/06/2016  . Paresthesia of right foot 10/25/2015  .  BPH without obstruction/lower urinary tract symptoms 10/25/2015  . Aorto-iliac atherosclerosis (Mario Mcdowell)   . Smoker    Past Medical History:  Diagnosis Date  . Aorto-iliac atherosclerosis (Mario Mcdowell)    by CT; in bilateral legs   . Arthritis   . Atherosclerotic PVD with intermittent claudication ([place])   . BPH without obstruction/lower urinary tract symptoms 10/25/2015  . Centrilobular emphysema ([place])    per chest CT 04-23-2016  . Colon adenocarcinoma [facility]) oncologist- dr Burr Medico   dx 05-21-2016 -- Stage IIIB (pT3,pN1,M0) , Invasive sigmoid  adenocarcinoma well to moderately differentiated with metastatic intra-abdominal lymph node (1 out of 22), s/p lap. partial colectomy Mario Mcdowell) 05/21/2016 and chemotherapy is planned  . Full dentures   . History of kidney stones 07/2015   passed without intervention  . Pulmonary nodules    per chest CT 04-23-2016 bilateral nodules (consistent w/ granulomatous disease)  . Smokers' cough ([place])   . Wears contact lenses     Family History  Problem Relation Age of Onset  . Cancer Paternal Aunt        unsure details  . Diabetes Cousin   . Cancer Paternal Uncle        unknow type cancer   . Prostate cancer Neg Hx   . Kidney cancer Neg Hx   . CAD Neg Hx   . Stroke Neg Hx   . Colon cancer Neg Hx     Past Surgical History:  Procedure Laterality Date  . COLONOSCOPY  03/12/2016   4 polyps, sigmoid tumor biopsied (TVA w/ MICROSCOPIC FOCUS SUSPICIOUS FOR INVASION) pending surgical resection, rpt 1 yr [facility])  . MULTIPLE TOOTH EXTRACTIONS  1980's   Social History   Occupational History  . Not on file  Tobacco Use  . Smoking status: Current Every Day Smoker    Packs/day: 0.50    Years: 46.00    Pack years: 23.00    Types: Cigarettes  . Smokeless tobacco: Never Used  . Tobacco comment: trying to quit -reduced from 1.5ppd  Substance and Sexual Activity  . Alcohol use: No    Comment: quit 2000  . Drug use: No  . Sexual activity: No    Birth control/protection: Abstinence     Garald Balding, MD   Note - This record has been created using [employer].  Chart creation errors have been sought, but may not always  have been located. Such creation errors do not reflect on  the standard of medical care.

## 2016-11-22 NOTE — Addendum Note (Signed)
Addended by: Mervyn Skeeters on: 11/22/2016 04:53 PM   Modules accepted: Orders

## 2016-11-22 NOTE — Progress Notes (Deleted)
Office Visit Note   Patient: Mario Mcdowell           Date of Birth: 07-Jun-1950           MRN: [id number] Visit Date: 11/22/2016              Requested by: Mario Bush, MD [address] [place], [place] [address] PCP: Mario Bush, MD   Assessment & Mcdowell: Visit Diagnoses: No diagnosis found.  Mcdowell: ***  Follow-Up Instructions: No Follow-up on file.   Orders:  No orders of the defined types were placed in this encounter.  No orders of the defined types were placed in this encounter.     Procedures: No procedures performed   Clinical Data: No additional findings.   Subjective: Chief Complaint  Patient presents with  . Right Foot - Numbness    Mario Mcdowell is a 66 y o that presents with bilateral foot pain with bunions on both,burning/stinging pain on plantar since 3 months. Hx of colon cancer, chemo (last one August 2018). The pain radiates now from buttocks to feet.  . Left Foot - Numbness    HPI  Review of Systems  Constitutional: Negative for fatigue.  HENT: Negative for hearing loss.   Respiratory: Negative for apnea, chest tightness and shortness of breath.   Cardiovascular: Negative for chest pain, palpitations and leg swelling.  Gastrointestinal: Negative for blood in stool, constipation and diarrhea.  Genitourinary: Negative for difficulty urinating.  Musculoskeletal: Negative for arthralgias, back pain, joint swelling, myalgias, neck pain and neck stiffness.  Neurological: Positive for numbness. Negative for weakness and headaches.  Hematological: Does not bruise/bleed easily.  Psychiatric/Behavioral: Positive for sleep disturbance. The patient is not nervous/anxious.      Objective: Vital Signs: BP (!) 147/80   Pulse 64   Resp 12   Ht 5\' 5"  (1.651 m)   Wt 116 lb (52.6 kg)   BMI 19.30 kg/m   Physical Exam  Ortho Exam  Specialty Comments:  No specialty comments available.  Imaging: No results found.   PMFS  History: Patient Active Problem List   Diagnosis Date Noted  . Acute bacterial bronchitis 11/08/2016  . Insomnia 07/24/2016  . Port catheter in place 07/23/2016  . 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 Welcome to Medicare preventive visit 01/06/2016  . Advanced care planning/counseling discussion 01/06/2016  . HLD (hyperlipidemia) 01/06/2016  . Corns and callus 01/06/2016  . Paresthesia of right foot 10/25/2015  . BPH without obstruction/lower urinary tract symptoms 10/25/2015  . Aorto-iliac atherosclerosis (Mario Mcdowell)   . Smoker    Past Medical History:  Diagnosis Date  . Aorto-iliac atherosclerosis ([place])    by CT; in bilateral legs   . Arthritis   . Atherosclerotic PVD with intermittent claudication (Mario Mcdowell)   . BPH without obstruction/lower urinary tract symptoms 10/25/2015  . Centrilobular emphysema (Mario Mcdowell)    per chest CT 04-23-2016  . Colon adenocarcinoma Mario Mcdowell) oncologist- dr Mario Mcdowell   dx 05-21-2016 -- Stage IIIB (pT3,pN1,M0) , Invasive sigmoid adenocarcinoma well to moderately differentiated with metastatic intra-abdominal lymph node (1 out of 22), s/p lap. partial colectomy Mario Mcdowell) 05/21/2016 and chemotherapy is planned  . Full dentures   . History of kidney stones 07/2015   passed without intervention  . Pulmonary nodules    per chest CT 04-23-2016 bilateral nodules (consistent w/ granulomatous disease)  . Smokers' cough (Mario Mcdowell)   . Wears contact lenses     Family History  Problem  Relation Age of Onset  . Cancer Paternal Aunt        unsure details  . Diabetes Cousin   . Cancer Paternal Uncle        unknow type cancer   . Prostate cancer Neg Hx   . Kidney cancer Neg Hx   . CAD Neg Hx   . Stroke Neg Hx   . Colon cancer Neg Hx     Past Surgical History:  Procedure Laterality Date  . COLONOSCOPY  03/12/2016   4 polyps, sigmoid tumor biopsied (TVA w/ MICROSCOPIC FOCUS SUSPICIOUS FOR INVASION) pending surgical resection, rpt 1 yr  Mario Mcdowell)  . MULTIPLE TOOTH EXTRACTIONS  1980's   Social History   Occupational History  . Not on file  Tobacco Use  . Smoking status: Current Every Day Smoker    Packs/day: 0.50    Years: 46.00    Pack years: 23.00    Types: Cigarettes  . Smokeless tobacco: Never Used  . Tobacco comment: trying to quit -reduced from 1.5ppd  Substance and Sexual Activity  . Alcohol use: No    Comment: quit 2000  . Drug use: No  . Sexual activity: No    Birth control/protection: Abstinence

## 2016-11-29 ENCOUNTER — Telehealth (INDEPENDENT_AMBULATORY_CARE_PROVIDER_SITE_OTHER): Payer: Self-pay | Admitting: Orthopaedic Surgery

## 2016-11-29 NOTE — Telephone Encounter (Signed)
Patient is a little confused about scheduling MRI. Per patient vein speciality told patient they would do all scans there. Patient confused as to scheduling MRI still, or just appt with Vein Specialist. Please call to advise.

## 2016-11-30 ENCOUNTER — Other Ambulatory Visit: Payer: Self-pay

## 2016-11-30 DIAGNOSIS — M79604 Pain in right leg: Secondary | ICD-10-CM

## 2016-11-30 DIAGNOSIS — M79605 Pain in left leg: Secondary | ICD-10-CM

## 2016-12-03 ENCOUNTER — Telehealth (INDEPENDENT_AMBULATORY_CARE_PROVIDER_SITE_OTHER): Payer: Self-pay

## 2016-12-03 ENCOUNTER — Other Ambulatory Visit (HOSPITAL_BASED_OUTPATIENT_CLINIC_OR_DEPARTMENT_OTHER): Payer: Medicare Other

## 2016-12-03 ENCOUNTER — Ambulatory Visit (HOSPITAL_COMMUNITY)
Admission: RE | Admit: 2016-12-03 | Discharge: 2016-12-03 | Disposition: A | Discharge status: Home / Self Care | Payer: Medicare Other | Source: Ambulatory Visit | Attending: Hematology | Admitting: Hematology

## 2016-12-03 DIAGNOSIS — Z9889 Other specified postprocedural states: Secondary | ICD-10-CM | POA: Insufficient documentation

## 2016-12-03 DIAGNOSIS — R109 Unspecified abdominal pain: Secondary | ICD-10-CM | POA: Diagnosis not present

## 2016-12-03 DIAGNOSIS — C187 Malignant neoplasm of sigmoid colon: Secondary | ICD-10-CM | POA: Insufficient documentation

## 2016-12-03 DIAGNOSIS — C772 Secondary and unspecified malignant neoplasm of intra-abdominal lymph nodes: Secondary | ICD-10-CM | POA: Insufficient documentation

## 2016-12-03 LAB — COMPREHENSIVE METABOLIC PANEL
ALBUMIN: 3.6 g/dL (ref 3.5–5.0)
ALK PHOS: 85 U/L (ref 40–150)
ALT: 14 U/L (ref 0–55)
AST: 17 U/L (ref 5–34)
Anion Gap: 9 mEq/L (ref 3–11)
BUN: 14.4 mg/dL (ref 7.0–26.0)
CO2: 26 mEq/L (ref 22–29)
Calcium: 9.5 mg/dL (ref 8.4–10.4)
Chloride: 104 mEq/L (ref 98–109)
Creatinine: 0.8 mg/dL (ref 0.7–1.3)
GLUCOSE: 108 mg/dL (ref 70–140)
Potassium: 4.4 mEq/L (ref 3.5–5.1)
Sodium: 139 mEq/L (ref 136–145)
TOTAL PROTEIN: 7.1 g/dL (ref 6.4–8.3)

## 2016-12-03 LAB — CBC WITH DIFFERENTIAL/PLATELET
BASO%: 0.6 % (ref 0.0–2.0)
Basophils Absolute: 0 10*3/uL (ref 0.0–0.1)
EOS%: 1.4 % (ref 0.0–7.0)
Eosinophils Absolute: 0.1 10*3/uL (ref 0.0–0.5)
HCT: 44.3 % (ref 38.4–49.9)
HGB: 14.9 g/dL (ref 13.0–17.1)
LYMPH#: 3.5 10*3/uL — AB (ref 0.9–3.3)
LYMPH%: 49.2 % — AB (ref 14.0–49.0)
MCH: 32.8 pg (ref 27.2–33.4)
MCHC: 33.6 g/dL (ref 32.0–36.0)
MCV: 97.5 fL (ref 79.3–98.0)
MONO#: 0.5 10*3/uL (ref 0.1–0.9)
MONO%: 7.3 % (ref 0.0–14.0)
NEUT#: 2.9 10*3/uL (ref 1.5–6.5)
NEUT%: 41.5 % (ref 39.0–75.0)
Platelets: 178 10*3/uL (ref 140–400)
RBC: 4.54 10*6/uL (ref 4.20–5.82)
RDW: 12.7 % (ref 11.0–14.6)
WBC: 7 10*3/uL (ref 4.0–10.3)

## 2016-12-03 LAB — CEA (IN HOUSE-CHCC): CEA (CHCC-IN HOUSE): 4.61 ng/mL (ref 0.00–5.00)

## 2016-12-03 MED ORDER — IOPAMIDOL (ISOVUE-300) INJECTION 61%
INTRAVENOUS | Status: AC
  Filled 2016-12-03: qty 100

## 2016-12-03 MED ORDER — IOPAMIDOL (ISOVUE-300) INJECTION 61%
100.0000 mL | Freq: Once | INTRAVENOUS | Status: AC | PRN
  Administered 2016-12-03: 80 mL via INTRAVENOUS

## 2016-12-03 NOTE — Telephone Encounter (Signed)
Patient was returning call to [facility] concerning message she had left on his VM.  CB# is [phone].  Please advise.  Thank You.

## 2016-12-03 NOTE — Telephone Encounter (Signed)
LVMOM to ask about MRI and VV

## 2016-12-04 NOTE — Progress Notes (Signed)
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 Resected and retrieved. - Rule out malignancy, tumor in the sigmoid colon. Biopsied. Tattooed. - Internal hemorrhoids. - The examination was otherwise normal on direct and retroflexion views.       03/14/2016 Imaging    CT A/P W CONTRAST  IMPRESSION: Short segment area of wall thickening in the distal sigmoid colon, suspicious for site of primary colon carcinoma. Recommend correlation with colonoscopy results.      04/23/2016 Imaging    CT Chest W Contrast IMPRESSION: 1. No evidence of pulmonary metastasis. 2. Bilateral calcified pulmonary nodules and mediastinal lymph nodes consistent with granulomatous disease. 3. Centrilobular emphysema. 4. Coronary artery calcification and aortic atherosclerotic calcification.      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 Colon, segmental resection for tumor, sigmoid - INVASIVE ADENOCARCINOMA, WELL TO MODERATELY DIFFERENTIATED, WITH ABUNDANT MUCIN (PT3  PN1) - METASTATIC ADENOCARCINOMA IN ONE OF TWENTY-TWO LYMPH NODES (1/22) - TWO TUMOR DEPOSITS PRESENT - MARGINS UNINVOLVED BY CARCINOMA - HYPERPLASTIC POLYPS (X2) - SEE ONCOLOGY TABLE BELOW 2. Colon, resection margin (donut), final distal margin - BENIGN COLON - NO MALIGNANCY IDENTIFIED      05/21/2016 Surgery    LAPAROSCOPIC PARTIAL COLECTOMY, SPLENIC FLEXURE MOBILIZATION AND PROCTOSCOPY      06/25/2016 - 09/03/2016 Chemotherapy    FOLFOX every 2 weeks for 3 months       12/03/2016 Imaging    CT AP 12/03/16 IMPRESSION: Status post sigmoid resection. No evidence of recurrent or metastatic disease.       HISTORY OF PRESENTING ILLNESS:  Mario Mcdowell 66 y.o. male is here because of recently diagnosed colorectalcancer. He has reported to Dr. [name] on [birth year] for a evaluation of a rectosigmoid colon mass. 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 He denied any change in his bowel movement or habits of rectal bleeding.   He had a screening colonoscopy by Dr. Fuller Plan 03/12/2016. Who referred him to [name]. He denies any symptoms before. Before surgery, no changes in weight loss or change in energy level  Since the surgery, his appetite has been good and his energy level is fine.No pain reported from the incision. He takes hydrocodone for his pain.  August 08, 2015 he was admitted to the hospital for right lower quadrant abdominal pain due to a kidney stone.  He presents to the clinic today with his wife for consult of his stage 3 colon cancer and chemotherapy. The patient's wife says he doesn't go to the doctor often. He is complaint and wants to  do whatever he can to get treated. He is worried about what he can eat when he can start chemo.  CURRENT THERAPY: Surveillance   INTERVAL HISTORY:   Mario Mcdowell returns for follow up.He presents to the clinic today noting he feels well. He feels better overall since completing chemo. His appetite is better, no  abdominal pain. He reports he cannot walk very far before stopping due to pain in his buttocks. He saw Dr. Durward Fortes who set up [place] MRI appointment next week. He notes to still having tingling in toes that occurs before the pain in his buttocks starts. When still the pain is not there but when walking for some time that is when this occurs. He think it may be a vascular issue. He has no other concerns. He has not quit smoking. He is willing to get flu shot done by a pharmacy.     MEDICAL HISTORY:  Past Medical History:  Diagnosis Date  . Aorto-iliac atherosclerosis ([place])    by CT; in bilateral legs   . Arthritis   . Atherosclerotic PVD with intermittent claudication ([place])   . BPH without obstruction/lower urinary tract symptoms 10/25/2015  . Centrilobular emphysema ([place])    per chest CT 04-23-2016  . Colon adenocarcinoma [facility]) oncologist- dr Burr Medico   dx 05-21-2016 -- Stage IIIB (pT3,pN1,M0) , Invasive sigmoid adenocarcinoma well to moderately differentiated with metastatic intra-abdominal lymph node (1 out of 22), s/p lap. partial colectomy [name]) 05/21/2016 and chemotherapy is planned  . Full dentures   . History of kidney stones 07/2015   passed without intervention  . Pulmonary nodules    per chest CT 04-23-2016 bilateral nodules (consistent w/ granulomatous disease)  . Smokers' cough ([place])   . Wears contact lenses     SURGICAL HISTORY: Past Surgical History:  Procedure Laterality Date  . COLONOSCOPY  03/12/2016   4 polyps, sigmoid tumor biopsied (TVA w/ MICROSCOPIC FOCUS SUSPICIOUS FOR INVASION) pending surgical resection, rpt 1 yr Fuller Plan)  . LAPAROSCOPIC PARTIAL COLECTOMY N/A 05/21/2016   colon cancer, 1/20 positive lymp nodes. LAPAROSCOPIC PARTIAL COLECTOMY, SPLENIC FLEXURE MOBILIZATION AND PROCTOSCOPY;  [name], MD)  . MULTIPLE TOOTH EXTRACTIONS  1980's  . PORT-A-CATH REMOVAL Right 10/04/2016   Procedure: REMOVAL PORT-A-CATH;  Surgeon: [name], MD;   Location: [facility];  Service: General;  Laterality: Right;  . PORTACATH PLACEMENT N/A 06/21/2016   Procedure: INSERTION PORT-A-CATH;  Surgeon: [name], MD;  Location: [facility];  Service: General;  Laterality: N/A;  right    SOCIAL HISTORY: Social History   Socioeconomic History  . Marital status: Married    Spouse name: Not on file  . Number of children: Not on file  . Years of education: Not on file  . Highest education level: Not on file  Social Needs  . Financial resource strain: Not on file  . Food insecurity - worry: Not on file  . Food insecurity - inability: Not on file  . Transportation needs - medical: Not on file  . Transportation needs - non-medical: Not on file  Occupational History  . Not on file  Tobacco Use  . Smoking status: Current Every Day Smoker    Packs/day: 0.50    Years: 46.00    Pack years: 23.00    Types: Cigarettes  . Smokeless tobacco: Never Used  . Tobacco comment: trying to quit -reduced from 1.5ppd  Substance and Sexual Activity  . Alcohol use: No    Comment: quit 2000  .  Drug use: No  . Sexual activity: No    Birth control/protection: Abstinence  Other Topics Concern  . Not on file  Social History Narrative   Lives with wife, adopted daughter, adopted son   Occ: Games developer   Edu: 8th grade     FAMILY HISTORY: Family History  Problem Relation Age of Onset  . Cancer Paternal Aunt        unsure details  . Diabetes Cousin   . Cancer Paternal Uncle        unknow type cancer   . Prostate cancer Neg Hx   . Kidney cancer Neg Hx   . CAD Neg Hx   . Stroke Neg Hx   . Colon cancer Neg Hx     ALLERGIES:  is allergic to penicillins and lipitor [atorvastatin].  MEDICATIONS:  Current Outpatient Medications  Medication Sig Dispense Refill  . aspirin EC 81 MG tablet Take 81 mg by mouth daily.     No current facility-administered medications for this visit.     REVIEW OF SYSTEMS:  Constitutional:  Denies fevers, chills or abnormal night sweats Eyes: Denies blurriness of vision, double vision or watery eyes Ears, nose, mouth, throat, and face: Denies mucositis or sore throat Respiratory: Denies cough, dyspnea or wheezes Cardiovascular: Denies palpitation, chest discomfort or lower extremity swelling Gastrointestinal:  Denies heartburn or change in bowel habits  Skin: Denies abnormal skin rashes Lymphatics: Denies new lymphadenopathy or easy bruising  Neurological:Denies numbness, tingling or new weaknesses (+) occasional numbness of left foot leading to pain in bilateral buttocks  Behavioral/Psych: Mood is stable, no new changes  All other systems were reviewed with the patient and are negative.  PHYSICAL EXAMINATION:  ECOG PERFORMANCE STATUS: 1 - Symptomatic but completely ambulatory  Vitals:   12/05/16 0947  BP: 119/67  Pulse: 74  Resp: 18  Temp: (!) 97.5 F (36.4 C)  SpO2: 98%   Filed Weights   12/05/16 0947  Weight: 119 lb (54 kg)    GENERAL:alert, no distress and comfortable. Seen in infusion area, presents in treatment chair. SKIN: skin color, texture, turgor are normal, no rashes or significant lesions EYES: normal, conjunctiva are pink and non-injected, sclera clear OROPHARYNX:no exudate, no erythema and lips, buccal mucosa, and tongue normal  NECK: supple, thyroid normal size, non-tender, without nodularity LYMPH:  no palpable lymphadenopathy in the cervical, axillary or inguinal LUNGS: clear to auscultation and percussion with normal breathing effort HEART: regular rate & rhythm and no murmurs and no lower extremity edema ABDOMEN:abdomen soft, non-tender and normal bowel sounds, surgical incisions are well healed  Musculoskeletal:no cyanosis of digits and no clubbing  PSYCH: alert & oriented x 3 with fluent speech NEURO: no focal motor/sensory deficits  LABORATORY DATA:  I have reviewed the data as listed CBC Latest Ref Rng & Units 12/03/2016 10/04/2016  09/03/2016  WBC 4.0 - 10.3 10e3/uL 7.0 - 7.3  Hemoglobin 13.0 - 17.1 g/dL 14.9 13.4 12.9(L)  Hematocrit 38.4 - 49.9 % 44.3 - 38.4  Platelets 140 - 400 10e3/uL 178 - 125(L)   CMP Latest Ref Rng & Units 12/03/2016 09/03/2016 08/20/2016  Glucose 70 - 140 mg/dl 108 77 132  BUN 7.0 - 26.0 mg/dL 14.4 12.5 17.0  Creatinine 0.7 - 1.3 mg/dL 0.8 0.9 0.8  Sodium 136 - 145 mEq/L 139 144 140  Potassium 3.5 - 5.1 mEq/L 4.4 4.1 4.1  Chloride 101 - 111 mmol/L - - -  CO2 22 - 29 mEq/L '26 26 25  '$ Calcium 8.4 -  10.4 mg/dL 9.5 9.7 9.5  Total Protein 6.4 - 8.3 g/dL 7.1 6.7 6.9  Total Bilirubin 0.20 - 1.20 mg/dL <0.22 <0.22 0.22  Alkaline Phos 40 - 150 U/L 85 97 98  AST 5 - 34 U/L '17 24 19  '$ ALT 0 - 55 U/L '14 26 20    '$ Pathology report  Diagnosis 05/21/16 1. Colon, segmental resection for tumor, sigmoid - INVASIVE ADENOCARCINOMA, WELL TO MODERATELY DIFFERENTIATED, WITH ABUNDANT MUCIN (PT3 PN1) - METASTATIC ADENOCARCINOMA IN ONE OF TWENTY-TWO LYMPH NODES (1/22) - TWO TUMOR DEPOSITS PRESENT - MARGINS UNINVOLVED BY CARCINOMA - HYPERPLASTIC POLYPS (X2) - SEE ONCOLOGY TABLE BELOW 2. Colon, resection margin (donut), final distal margin - BENIGN COLON - NO MALIGNANCY IDENTIFIED Microscopic Comment 1. ONCOLOGY TABLE: COLON Specimen: Sigmoid colon Procedure: Sigmoidectomy Tumor site: Sigmoid Macroscopic tumor perforation: Not identified Invasive tumor: 3.4 x 2.5 x 0.6 cm Histologic type(s): Adenocarcinoma with abundant mucin Histologic grade and differentiation: G1- G2 Microscopic extension of invasive tumor: Tumor invades muscularis propria Lymph-Vascular invasion: Not identified Perineural invasion: Not identified Treatment Effect: No known presurgical therapy Tumor deposit(s): Present, 2 Resection margins: All margins are uninvolved by invasive carcinoma, high-grade dysplasia, intramucosal adenocarcinoma, and adenoma Margins examined: Proximal, distal and Radial Distance closest margin (if all above  margins negative): 3.6 cm (distal) Additional polyp(s): Hyperplastic polyps (x2) Lymph nodes: number examined 22; number positive: 1 1 of 5 Supplemental copy SUPPLEMENTAL for OZAN, MACLAY [phone]) Microscopic Comment(continued) Pathologic Staging: pT3, pN1 Ancillary studies: performed on block 1G (MMR by immunohistochemistry and MSI by PCR; will be reported separately.) Dr. Lyndon Code has reviewed the pertinent slide(1E) and agrees that adenocarcinoma is present in this sample.  RADIOGRAPHIC STUDIES: I have personally reviewed the radiological images as listed and agreed with the findings in the report. Ct Abdomen Pelvis W Contrast  Result Date: 12/03/2016 CLINICAL DATA:  Colon cancer with nodal metastases, status post resection and chemotherapy EXAM: CT ABDOMEN AND PELVIS WITH CONTRAST TECHNIQUE: Multidetector CT imaging of the abdomen and pelvis was performed using the standard protocol following bolus administration of intravenous contrast. CONTRAST:  46m ISOVUE-300 IOPAMIDOL (ISOVUE-300) INJECTION 61% COMPARISON:  03/14/2016 FINDINGS: Lower chest: Calcified granulomata in the bilateral lower lobes. Hepatobiliary: Liver is within normal limits. Gallbladder is unremarkable. No intrahepatic extrahepatic duct dilatation. Pancreas: Within normal limits. Spleen: Calcified buttock granulomata. Adrenals/Urinary Tract: Adrenal glands are within normal limits. Kidneys are within normal limits.  No hydronephrosis. Bladder is within normal limits. Stomach/Bowel: Stomach is within normal limits. No evidence of bowel obstruction. Normal appendix (series 2/ image 49). Status post sigmoid resection with suture line in the left lower pelvis (series 2/image 60). Vascular/Lymphatic: No evidence of abdominal aortic aneurysm. Atherosclerotic calcifications of the abdominal aorta and branch vessels. No suspicious abdominopelvic lymphadenopathy. Reproductive: Prostate is grossly unremarkable. Other: No abdominopelvic  ascites. Musculoskeletal: Mild degenerative changes of the visualized thoracolumbar spine. IMPRESSION: Status post sigmoid resection. No evidence of recurrent or metastatic disease. Electronically Signed   By: SJulian HyM.D.   On: 12/03/2016 12:58   Xr Lumbar Spine 2-3 Views  Result Date: 11/22/2016 Films of lumbar spine were obtained in AP and lateral projection. There are degenerative changes at L4-5 and L5 5 S1 facet joints there is a decrease in the disc space height between L5 and S1. No evidence of a compression fracture. Diffuse calcification within the abdominal aorta. Widest diameter that I measured was 32.4 mm at the level of L3  Xr Pelvis 1-2 Views  Result Date: 11/22/2016 AP the pelvis  demonstrates diffuse calcification within the arterial tree bilaterally extending to the bifurcation. There are diffuse degenerative changes at L4-5 and L5-S1 which limited on the one view. Hip joints appear to be well maintained. Sacral iliac joints are intact. Bony pelvis intact. No ectopic calcification other than within the arterial tree    CT CHEST W CONTRAST 04/23/16 IMPRESSION: 1. No evidence of pulmonary metastasis. 2. Bilateral calcified pulmonary nodules and mediastinal lymph nodes consistent with granulomatous disease. 3. Centrilobular emphysema. 4. Coronary artery calcification and aortic atherosclerotic calcification.  CT A/P W CONTRAST 03/14/16 IMPRESSION: Short segment area of wall thickening in the distal sigmoid colon, suspicious for site of primary colon carcinoma. Recommend correlation with colonoscopy results.  No evidence of local or distant metastatic disease. No evidence of bowel obstruction.  PROCEDURES Colonoscopy 03/12/2016 Four 6 to 8 mm polyps in the rectum and in the transverse colon, removed with a cold snare. Resected and retrieved. - Rule out malignancy, tumor in the sigmoid colon. Biopsied. Tattooed. - Internal hemorrhoids. - The examination was  otherwise normal on direct and retroflexion views.   ASSESSMENT & PLAN:  66 y.o.  male  1. 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 -We previously discussed the risk of cancer recurrence after surgery. Due to his stage IIIB disease, he is at high risk for recurrence -We previously discussed the standard adjuvant chemotherapy for stage III colon cancer,  reduce the risk of cancer recurrence. -We previously discussed the regimens for adjuvant chemotherapy, including FOLFOX, CAPOX, or single agent Xeloda or 5-FU. He is 81, but does not have many medical comorbidities and recovered from surgery quickly. I recommended him to consider FOLFOX -We previously discussed the duration of adjuvant chemotherapy. Based on the recent reported IDEA study in ASCO 2017, I recommended 3 months therapy.  -He underwent adjuvant chemotherapy 06/25/16-09/03/16, tolerated well overall, mild nausea, no neuropathy. --I discussed the risk of cancer recurrence in the future. I discussed the surveillance plan, which is a physical exam and lab test (including CBC, CMP and CEA) every 3-4 months for the first 2 years, then every 6-12 months, colonoscopy in one year, and surveilliance CT scan every 6-12 month for up to 5 year.  -Port was removed on 10/04/16 by Dr. [name]  -I reviewed his surveillance CT AP from 12/03/16 which showed no evidence of recurrence.  -12/03/16 Labs reviewed, CEA slightly elevated likely due to his smoking 3 months ago, back to normal now. CBC and CMP are overall in normal limits.  -I offered Flu shot today, he agreed -He is Due for Colonoscopy and repeat scan in May 2019 -I plan to see him back in 3 month    2. Smoking cessation  - I strongly encouraged him to work towards quitting. I  recommended Nicotine patches or counseling to help him quit. - I again strongly encouraged him to cut back his smoking -I encouraged him to get flu vaccination especially as he has yet to stop smoking, he agreed to flu shot with his pharmacy.   3. Emphysema  4. BPH  5. History of kidney stones  6. Bilateral numbness and muscular pain in thighs/buttocks, claudication -not typical for chemo induced neuropathy  -continue monitoring  -I suspect his leg numbness and buttock pain is related to vascular reasons. Dr. Donne Hazel has ordered a  lumbar MRI and referred him to see a vascular surgeon.   PLAN -Skin and lab results reviewed, NED -Continue cancer surveillance, lab and f/u in 3 months    No orders of the defined types were placed in this encounter.   All questions were answered. The patient knows to call the clinic with any problems, questions or concerns.  I spent 20 minutes counseling the patient face to face. The total time spent in the appointment was 25 minutes and more than 50% was on counseling.  This document serves as a record of services personally performed by Truitt Merle, MD. It was created on her behalf by Joslyn Devon, a trained medical scribe. The creation of this record is based on the scribe's personal observations and the provider's statements to them.    I have reviewed the above documentation for accuracy and completeness, and I agree with the above.    Truitt Merle, MD 12/05/2016

## 2016-12-05 ENCOUNTER — Telehealth: Payer: Self-pay | Admitting: Hematology

## 2016-12-05 ENCOUNTER — Ambulatory Visit (HOSPITAL_BASED_OUTPATIENT_CLINIC_OR_DEPARTMENT_OTHER): Payer: Medicare Other | Admitting: Hematology

## 2016-12-05 ENCOUNTER — Encounter: Payer: Self-pay | Admitting: Hematology

## 2016-12-05 VITALS — BP 119/67 | HR 74 | Temp 97.5°F | Resp 18 | Ht 65.0 in | Wt 119.0 lb

## 2016-12-05 DIAGNOSIS — Z23 Encounter for immunization: Secondary | ICD-10-CM | POA: Diagnosis not present

## 2016-12-05 DIAGNOSIS — C187 Malignant neoplasm of sigmoid colon: Secondary | ICD-10-CM

## 2016-12-05 DIAGNOSIS — R200 Anesthesia of skin: Secondary | ICD-10-CM

## 2016-12-05 DIAGNOSIS — C772 Secondary and unspecified malignant neoplasm of intra-abdominal lymph nodes: Secondary | ICD-10-CM | POA: Diagnosis not present

## 2016-12-05 DIAGNOSIS — M7918 Myalgia, other site: Secondary | ICD-10-CM

## 2016-12-05 DIAGNOSIS — Z720 Tobacco use: Secondary | ICD-10-CM | POA: Diagnosis not present

## 2016-12-05 MED ORDER — INFLUENZA VAC SPLIT QUAD 0.5 ML IM SUSY
0.5000 mL | PREFILLED_SYRINGE | Freq: Once | INTRAMUSCULAR | Status: AC
  Administered 2016-12-05: 0.5 mL via INTRAMUSCULAR
  Filled 2016-12-05: qty 0.5

## 2016-12-05 NOTE — Telephone Encounter (Signed)
Scheduled appt per 11/21 los - Gave patient AVS and  Calender per los.,

## 2016-12-10 ENCOUNTER — Ambulatory Visit
Admission: RE | Admit: 2016-12-10 | Discharge: 2016-12-10 | Disposition: A | Discharge status: Home / Self Care | Payer: Medicare Other | Source: Ambulatory Visit | Attending: Orthopaedic Surgery | Admitting: Orthopaedic Surgery

## 2016-12-10 DIAGNOSIS — M5127 Other intervertebral disc displacement, lumbosacral region: Secondary | ICD-10-CM | POA: Diagnosis not present

## 2016-12-10 DIAGNOSIS — M174 Other bilateral secondary osteoarthritis of knee: Secondary | ICD-10-CM

## 2016-12-11 NOTE — Telephone Encounter (Signed)
MRI 11/26

## 2016-12-14 ENCOUNTER — Encounter: Payer: Self-pay | Admitting: Family Medicine

## 2016-12-14 ENCOUNTER — Other Ambulatory Visit: Payer: Self-pay

## 2016-12-14 ENCOUNTER — Ambulatory Visit: Payer: Medicare Other | Admitting: Family Medicine

## 2016-12-14 DIAGNOSIS — M79642 Pain in left hand: Secondary | ICD-10-CM | POA: Diagnosis not present

## 2016-12-14 MED ORDER — DICLOFENAC SODIUM 75 MG PO TBEC: 75.0000 mg | DELAYED_RELEASE_TABLET | Freq: Two times a day (BID) | ORAL | 0 refills | Status: DC

## 2016-12-14 NOTE — Patient Instructions (Signed)
Ice hand , elevate, start anti-inflammatory. Limit use of left hand.  Call if any fever or pain not improving as expected in 1-2 weeks.

## 2016-12-14 NOTE — Assessment & Plan Note (Signed)
MCP pain and inflammation.. Likely OA flare.  Less likely gout.  Will treat with NSAIDs, Ice , elevation.   No red flags for bacterial infection.   Re-eval with X-ray and uric acid if not improving as expected.

## 2016-12-14 NOTE — Progress Notes (Signed)
   Subjective:    Patient ID: Mario Mcdowell, male    DOB: Jan 24, 1950, 67 y.o.   MRN: [id number]  HPI    66 year old male pt of Dr.  Synthia Innocent presents with new onset  Left middle knuckle pain. He reports  24 hours of pain following changing light fixture. Using screwdriver for a few moments.  Noted soreness and swelling immediately after.  Redness.  Pain is moderate to severe, pain increase with moving central finger.   No known falls.  No gout in past. He has not tried to treat it.  He is on low dose aspirin.   No history of hand issues.  Has hip issues seeing ORTHO.  Has PAD.Marland Kitchen Has appt with vascular MD upcoming.  Social History /Family History/Past Medical History reviewed in detail and updated in EMR if needed. Blood pressure 120/60, pulse (!) 58, temperature 97.6 F (36.4 C), temperature source Oral, height 5\' 5"  (1.651 m), weight 120 lb (54.4 kg).  Review of Systems  Constitutional: Negative for fatigue and fever.  HENT: Negative for ear pain.   Eyes: Negative for pain.  Respiratory: Negative for cough and shortness of breath.   Cardiovascular: Negative for chest pain, palpitations and leg swelling.  Gastrointestinal: Negative for abdominal pain.  Genitourinary: Negative for dysuria.  Musculoskeletal: Negative for arthralgias.  Neurological: Negative for syncope, light-headedness and headaches.  Psychiatric/Behavioral: Negative for dysphoric mood.       Objective:   Physical Exam  Constitutional: Vital signs are normal. He appears well-developed and well-nourished.  HENT:  Head: Normocephalic.  Right Ear: Hearing normal.  Left Ear: Hearing normal.  Nose: Nose normal.  Mouth/Throat: Oropharynx is clear and moist and mucous membranes are normal.  Neck: Trachea normal. Carotid bruit is not present. No thyroid mass and no thyromegaly present.  Cardiovascular: Normal rate, regular rhythm and normal pulses. Exam reveals no gallop, no distant heart sounds and no friction rub.  No  murmur heard. No peripheral edema  Pulmonary/Chest: Effort normal and breath sounds normal. No respiratory distress.  Musculoskeletal:       Left wrist: Normal.       Hands: Pain and swelling of third MCP joint, decreased ROM given pain and swelling.  all other hand joints nontender and full ROM.   Skin: Skin is warm, dry and intact. No rash noted.  Psychiatric: He has a normal mood and affect. His speech is normal and behavior is normal. Thought content normal.          Assessment & Plan:

## 2016-12-20 ENCOUNTER — Ambulatory Visit: Payer: Medicare Other | Admitting: Podiatry

## 2016-12-20 ENCOUNTER — Encounter: Payer: Self-pay | Admitting: Podiatry

## 2016-12-20 DIAGNOSIS — Q828 Other specified congenital malformations of skin: Secondary | ICD-10-CM

## 2016-12-24 NOTE — Progress Notes (Signed)
Subjective: 66 year old male presents to the office today for concerns of painful calluses to both of his feet that he would like trimmed.  He states that he does well for a couple of months before the pain starts to come back to the calluses.  He like to hold off on surgery as this is not a guarantee that this is been affixed any other issues but he is interested in possibly a custom orthotic, but is concerned about cost.  Denies any systemic complaints such as fevers, chills, nausea, vomiting. No acute changes since last appointment, and no other complaints at this time.   Objective: AAO x3, NAD DP/PT pulses palpable bilaterally, CRT less than 3 seconds Protective sensation intact with Derrel Nip monofilament Hyperkeratotic lesions continue bilateral submetatarsal 5, medial hallux, left heel.  Upon debridement there is no underlying ulceration, drainage or any clinical signs of infection noted. There is no swelling, erythema identified bilaterally. No area of tenderness identified otherwise except for the hyperkeratotic lesions. No other open lesions or pre-ulcerative lesions.  No pain with calf compression, swelling, warmth, erythema  Assessment: Hyperkeratotic lesions  Plan:  -All treatment options discussed with the patient including all alternatives, risks, complications.  -Hyperkeratotic lesions were sharply debrided x5 without any complications or bleeding. -We discussed other options at this point including surgery as well as conservative treatment.  He wishes to hold off on any surgical intervention at this time.  Will check with insurance coverage in regards to possibly a custom accommodative orthotic to help offload the hyperkeratotic lesions.  We will contact him with the cost and insurance benefits that if he wishes to get these I will have him follow-up with Liliane Channel or Benjie Karvonen.   Trula Slade DPM

## 2016-12-27 ENCOUNTER — Telehealth: Payer: Self-pay | Admitting: Podiatry

## 2016-12-27 NOTE — Telephone Encounter (Signed)
Pt aware medicare will not cover and cost is 300.00 and is going to think about it.  I also made pt aware we can set a payment plan if needed.

## 2016-12-27 NOTE — Telephone Encounter (Signed)
Left message for pt to call to discuss orthotics coverage... °

## 2017-01-02 ENCOUNTER — Ambulatory Visit: Payer: Medicare Other | Admitting: Vascular Surgery

## 2017-01-02 ENCOUNTER — Encounter: Payer: Self-pay | Admitting: Vascular Surgery

## 2017-01-02 ENCOUNTER — Ambulatory Visit (HOSPITAL_COMMUNITY)
Admission: RE | Admit: 2017-01-02 | Discharge: 2017-01-02 | Disposition: A | Discharge status: Home / Self Care | Payer: Medicare Other | Source: Ambulatory Visit | Attending: Vascular Surgery | Admitting: Vascular Surgery

## 2017-01-02 ENCOUNTER — Other Ambulatory Visit: Payer: Self-pay

## 2017-01-02 VITALS — BP 109/64 | HR 62 | Temp 97.0°F | Resp 18 | Ht 65.0 in | Wt 119.4 lb

## 2017-01-02 DIAGNOSIS — M79605 Pain in left leg: Secondary | ICD-10-CM | POA: Diagnosis not present

## 2017-01-02 DIAGNOSIS — R0989 Other specified symptoms and signs involving the circulatory and respiratory systems: Secondary | ICD-10-CM

## 2017-01-02 DIAGNOSIS — I739 Peripheral vascular disease, unspecified: Secondary | ICD-10-CM | POA: Diagnosis not present

## 2017-01-02 DIAGNOSIS — M79604 Pain in right leg: Secondary | ICD-10-CM | POA: Diagnosis not present

## 2017-01-02 MED ORDER — CILOSTAZOL 100 MG PO TABS
100.0000 mg | ORAL_TABLET | Freq: Two times a day (BID) | ORAL | 11 refills | Status: DC
Start: 2017-01-02 — End: 2017-09-02

## 2017-01-02 NOTE — Progress Notes (Signed)
Patient name: Mario Mcdowell MRN: [id number] DOB: 07-Aug-1950 Sex: male   REASON FOR CONSULT:    Bilateral lower extremity pain.  The consult is requested by Dr. Joni Fears.  HPI:   Mario Mcdowell is a pleasant 65 y.o. male, who developed the onset of bilateral lower extremity pain about 6 months ago.  He describes tingling in both feet which is brought on by ambulation and then gradually extends up to his hips and then he gets severe pain in his hips.  If he sits down the symptoms eventually resolved.  It sounds like he is having significant hip claudication.  If the symptoms do not occur with standing or sitting.  He denies any history of rest pain or history of nonhealing ulcers.  His risk factors for peripheral vascular disease include hypercholesterolemia and tobacco use.  He denies any history of diabetes, hypertension, or family history of premature cardiovascular disease.  Of note he underwent resection of a colon cancer which was diagnosed in May of this year.  He feels that his symptoms worsened after his surgery.  Past Medical History:  Diagnosis Date  . Aorto-iliac atherosclerosis ([place])    by CT; in bilateral legs   . Arthritis   . Atherosclerotic PVD with intermittent claudication ([place])   . BPH without obstruction/lower urinary tract symptoms 10/25/2015  . Centrilobular emphysema ([place])    per chest CT 04-23-2016  . Colon adenocarcinoma [facility]) oncologist- dr Burr Medico   dx 05-21-2016 -- Stage IIIB (pT3,pN1,M0) , Invasive sigmoid adenocarcinoma well to moderately differentiated with metastatic intra-abdominal lymph node (1 out of 22), s/p lap. partial colectomy [name]) 05/21/2016 and chemotherapy is planned  . Full dentures   . History of kidney stones 07/2015   passed without intervention  . Pulmonary nodules    per chest CT 04-23-2016 bilateral nodules (consistent w/ granulomatous disease)  . Smokers' cough ([place])   . Wears contact lenses     Family History  Problem  Relation Age of Onset  . Cancer Paternal Aunt        unsure details  . Diabetes Cousin   . Cancer Paternal Uncle        unknow type cancer   . Prostate cancer Neg Hx   . Kidney cancer Neg Hx   . CAD Neg Hx   . Stroke Neg Hx   . Colon cancer Neg Hx     SOCIAL HISTORY: He smokes a pack per day of cigarettes and has been smoking since he was 66 years old. Social History   Socioeconomic History  . Marital status: Married    Spouse name: Not on file  . Number of children: Not on file  . Years of education: Not on file  . Highest education level: Not on file  Social Needs  . Financial resource strain: Not on file  . Food insecurity - worry: Not on file  . Food insecurity - inability: Not on file  . Transportation needs - medical: Not on file  . Transportation needs - non-medical: Not on file  Occupational History  . Not on file  Tobacco Use  . Smoking status: Current Every Day Smoker    Packs/day: 1.00    Years: 46.00    Pack years: 46.00    Types: Cigarettes  . Smokeless tobacco: Never Used  . Tobacco comment: trying to quit -reduced from 1.5ppd  Substance and Sexual Activity  . Alcohol use: No    Comment: quit 2000  . Drug use:  No  . Sexual activity: No    Birth control/protection: Abstinence  Other Topics Concern  . Not on file  Social History Narrative   Lives with wife, adopted daughter, adopted son   Occ: Games developer   Edu: 8th grade     Allergies  Allergen Reactions  . Penicillins Swelling    Has patient had a PCN reaction causing immediate rash, facial/tongue/throat swelling, SOB or lightheadedness with hypotension: Yes Has patient had a PCN reaction causing severe rash involving mucus membranes or skin necrosis: No Has patient had a PCN reaction that required hospitalization No Has patient had a PCN reaction occurring within the last 10 years: No If all of the above answers are "NO", then may proceed with Cephalosporin use.   . Lipitor [Atorvastatin]  Nausea Only    Current Outpatient Medications  Medication Sig Dispense Refill  . aspirin EC 81 MG tablet Take 81 mg by mouth daily.    . diclofenac (VOLTAREN) 75 MG EC tablet Take 1 tablet (75 mg total) by mouth 2 (two) times daily. (Patient not taking: Reported on 01/02/2017) 30 tablet 0   No current facility-administered medications for this visit.     REVIEW OF SYSTEMS:  [X]  denotes positive finding, [ ]  denotes negative finding Cardiac  Comments:  Chest pain or chest pressure:    Shortness of breath upon exertion:    Short of breath when lying flat:    Irregular heart rhythm:        Vascular    Pain in calf, thigh, or hip brought on by ambulation: x   Pain in feet at night that wakes you up from your sleep:     Blood clot in your veins:    Leg swelling:         Pulmonary    Oxygen at home:    Productive cough:     Wheezing:         Neurologic    Sudden weakness in arms or legs:     Sudden numbness in arms or legs:     Sudden onset of difficulty speaking or slurred speech:    Temporary loss of vision in one eye:     Problems with dizziness:         Gastrointestinal    Blood in stool:     Vomited blood:         Genitourinary    Burning when urinating:     Blood in urine:        Psychiatric    Major depression:         Hematologic    Bleeding problems:    Problems with blood clotting too easily:        Skin    Rashes or ulcers:        Constitutional    Fever or chills:     PHYSICAL EXAM:   Vitals:   01/02/17 0933  BP: 109/64  Pulse: 62  Resp: 18  Temp: (!) 97 F (36.1 C)  TempSrc: Oral  Weight: 119 lb 6.4 oz (54.2 kg)  Height: 5\' 5"  (1.651 m)    GENERAL: The patient is a well-nourished male, in no acute distress. The vital signs are documented above. CARDIAC: There is a regular rate and rhythm.  VASCULAR: He has bilateral carotid bruits. I cannot palpate femoral, popliteal, or pedal pulses. He has no significant lower extremity  swelling. PULMONARY: There is good air exchange bilaterally without wheezing or rales. ABDOMEN: Soft and non-tender  with normal pitched bowel sounds.  I do not palpate an abdominal aortic aneurysm.  He has a lower abdominal incision which is healed from his previous colon surgery. MUSCULOSKELETAL: There are no major deformities or cyanosis. NEUROLOGIC: No focal weakness or paresthesias are detected. SKIN: There are no ulcers or rashes noted. PSYCHIATRIC: The patient has a normal affect.  DATA:    CT ABDOMEN PELVIS: I did review his CT of the abdomen and pelvis which was done with contrast but was not a dedicated CT angiogram.  This shows occlusion of the aorta with reconstitution of the common femoral arteries.  He appears to have superficial femoral artery occlusions bilaterally.  ARTERIAL DOPPLER STUDY: I have independently interpreted his arterial Doppler study today.  On the right side he has a monophasic dorsalis pedis and posterior tibial signal.  ABI is 57%.  Toe pressure is 54 mmHg. On the left side he has a monophasic dorsalis pedis and posterior tibial signal.  ABI is 46% toe pressure on the left is 61 mmHg.   MEDICAL ISSUES:   AORTOILIAC OCCLUSIVE DISEASE: Based on his CT scan and exam he has evidence of aortoiliac occlusive disease.  We have discussed conservative treatment including the importance of tobacco cessation.  In addition I have encouraged him to get on a structured walking program and I have written him a prescription for cilostazol.  We also discussed the importance of nutrition.  I will see him back in 6 months with follow-up ABIs.  If his symptoms progress before that then he will call sooner.  If his symptoms progress  He may not be a candidate for aorto femoral bypass grafting given the severe calcific disease.  He might potentially be a candidate for axillobifemoral bypass.  I think he would need a dedicated CT angiogram if his symptoms progress.  BILATERAL  CAROTID BRUITS: He did have bilateral carotid bruits.  He is asymptomatic.  We will try to arrange to get a carotid duplex scan in the near future.  Deitra Mayo [facility] [phone]

## 2017-01-07 ENCOUNTER — Ambulatory Visit: Payer: Medicare Other

## 2017-01-08 ENCOUNTER — Other Ambulatory Visit: Payer: Self-pay | Admitting: Family Medicine

## 2017-01-08 DIAGNOSIS — N400 Enlarged prostate without lower urinary tract symptoms: Secondary | ICD-10-CM

## 2017-01-08 DIAGNOSIS — E7800 Pure hypercholesterolemia, unspecified: Secondary | ICD-10-CM

## 2017-01-09 ENCOUNTER — Ambulatory Visit (INDEPENDENT_AMBULATORY_CARE_PROVIDER_SITE_OTHER): Payer: Medicare Other

## 2017-01-09 VITALS — BP 112/74 | HR 73 | Temp 97.7°F | Ht 63.75 in | Wt 117.2 lb

## 2017-01-09 DIAGNOSIS — Z0000 Encounter for general adult medical examination without abnormal findings: Secondary | ICD-10-CM

## 2017-01-09 DIAGNOSIS — E7800 Pure hypercholesterolemia, unspecified: Secondary | ICD-10-CM | POA: Diagnosis not present

## 2017-01-09 DIAGNOSIS — N400 Enlarged prostate without lower urinary tract symptoms: Secondary | ICD-10-CM

## 2017-01-09 LAB — LIPID PANEL
CHOLESTEROL: 179 mg/dL (ref 0–200)
HDL: 34.3 mg/dL — ABNORMAL LOW (ref >39.00)
LDL Cholesterol: 126 mg/dL — ABNORMAL HIGH (ref 0–99)
NonHDL: 144.4
TRIGLYCERIDES: 93 mg/dL (ref 0.0–149.0)
Total CHOL/HDL Ratio: 5
VLDL: 18.6 mg/dL (ref 0.0–40.0)

## 2017-01-09 LAB — PSA: PSA: 0.55 ng/mL (ref 0.10–4.00)

## 2017-01-09 NOTE — Progress Notes (Signed)
PCP notes:   Health maintenance:  Tetanus - postponed/insurance PNA vaccines - pt declined  Abnormal screenings:   Fall risk - hx of fall without injury Hearing - failed  Hearing Screening   125Hz  250Hz  500Hz  1000Hz  2000Hz  3000Hz  4000Hz  6000Hz  8000Hz   Right ear:   0 40 0  0    Left ear:   0 40 40  0     Patient concerns:   PCP is asked to address swollen knuckle on left hand middle finger. Previously seen by Dr. Diona Browner who prescribed Diclofenac Sodium. Pt states he had stopped taking medication because the swelling has decreased.  Nurse concerns:  None  Next PCP appt:   01/11/17 @ 0830

## 2017-01-09 NOTE — Progress Notes (Signed)
Subjective:   Mario Mcdowell is a 66 y.o. male who presents for Medicare Annual/Subsequent preventive examination.  Review of Systems:  N/A Cardiac Risk Factors include: advanced age (>37men, >3 women);male gender     Objective:    Vitals: BP 112/74 (BP Location: Left Arm, Patient Position: Sitting, Cuff Size: Normal)   Pulse 73   Temp 97.7 F (36.5 C) (Oral)   Ht 5' 3.75" (1.619 m) Comment: no shoes  Wt 117 lb 4 oz (53.2 kg)   SpO2 96%   BMI 20.28 kg/m   Body mass index is 20.28 kg/m.  Advanced Directives 01/09/2017 12/05/2016 10/04/2016 08/20/2016 08/06/2016 06/25/2016 06/21/2016  Does Patient Have a Medical Advance Directive? No No No No No No No  Would patient like information on creating a medical advance directive? No - Patient declined - No - Patient declined No - Patient declined - No - Patient declined No - Patient declined    Tobacco Social History   Tobacco Use  Smoking Status Current Every Day Smoker  . Packs/day: 1.00  . Years: 46.00  . Pack years: 46.00  . Types: Cigarettes  Smokeless Tobacco Never Used  Tobacco Comment   trying to quit -reduced from 1.5ppd     Ready to quit: No Counseling given: No Comment: trying to quit -reduced from 1.5ppd   Clinical Intake:  Pre-visit preparation completed: Yes  Pain : No/denies pain Pain Score: 0-No pain     Nutritional Status: BMI of 19-24  Normal Nutritional Risks: None Diabetes: No  How often do you need to have someone help you when you read instructions, pamphlets, or other written materials from your doctor or pharmacy?: 1 - Never What is the last grade level you completed in school?: 7th grade  Interpreter Needed?: No  Comments: pt lives with spouse Information entered by :: LPinson, LPN  Past Medical History:  Diagnosis Date  . Aorto-iliac atherosclerosis ([place])    by CT; in bilateral legs   . Arthritis   . Atherosclerotic PVD with intermittent claudication ([place])   . BPH without  obstruction/lower urinary tract symptoms 10/25/2015  . Centrilobular emphysema ([place])    per chest CT 04-23-2016  . Colon adenocarcinoma [facility]) oncologist- dr Burr Medico   dx 05-21-2016 -- Stage IIIB (pT3,pN1,M0) , Invasive sigmoid adenocarcinoma well to moderately differentiated with metastatic intra-abdominal lymph node (1 out of 22), s/p lap. partial colectomy [name]) 05/21/2016 and chemotherapy is planned  . Full dentures   . History of kidney stones 07/2015   passed without intervention  . Pulmonary nodules    per chest CT 04-23-2016 bilateral nodules (consistent w/ granulomatous disease)  . Smokers' cough ([place])   . Wears contact lenses    Past Surgical History:  Procedure Laterality Date  . COLONOSCOPY  03/12/2016   4 polyps, sigmoid tumor biopsied (TVA w/ MICROSCOPIC FOCUS SUSPICIOUS FOR INVASION) pending surgical resection, rpt 1 yr [facility] Plan)  . LAPAROSCOPIC PARTIAL COLECTOMY N/A 05/21/2016   colon cancer, 1/20 positive lymp nodes. LAPAROSCOPIC PARTIAL COLECTOMY, SPLENIC FLEXURE MOBILIZATION AND PROCTOSCOPY;  [name], MD)  . MULTIPLE TOOTH EXTRACTIONS  1980's  . PORT-A-CATH REMOVAL Right 10/04/2016   Procedure: REMOVAL PORT-A-CATH;  Surgeon: [name], MD;  [facility]: [facility];  Service: General;  Laterality: Right;  . PORTACATH PLACEMENT N/A 06/21/2016   Procedure: INSERTION PORT-A-CATH;  Surgeon: [name], MD;  Location[facility];  Service: General;  Laterality: N/A;  right   Family History  Problem Relation Age of Onset  .  Cancer Paternal Aunt        unsure details  . Diabetes Cousin   . Cancer Paternal Uncle        unknow type cancer   . Prostate cancer Neg Hx   . Kidney cancer Neg Hx   . CAD Neg Hx   . Stroke Neg Hx   . Colon cancer Neg Hx    Social History   Socioeconomic History  . Marital status: Married    Spouse name: None  . Number of children: None  . Years of education: None  . Highest education level: None    Social Needs  . Financial resource strain: None  . Food insecurity - worry: None  . Food insecurity - inability: None  . Transportation needs - medical: None  . Transportation needs - non-medical: None  Occupational History  . None  Tobacco Use  . Smoking status: Current Every Day Smoker    Packs/day: 1.00    Years: 46.00    Pack years: 46.00    Types: Cigarettes  . Smokeless tobacco: Never Used  . Tobacco comment: trying to quit -reduced from 1.5ppd  Substance and Sexual Activity  . Alcohol use: No    Comment: quit 2000  . Drug use: No  . Sexual activity: No    Birth control/protection: Abstinence  Other Topics Concern  . None  Social History Narrative   Lives with wife, adopted daughter, adopted son   Occ: carpenter   Edu: 8th grade     Outpatient Encounter Medications as of 01/09/2017  Medication Sig  . cilostazol (PLETAL) 100 MG tablet Take 1 tablet (100 mg total) by mouth 2 (two) times daily before a meal.  . aspirin EC 81 MG tablet Take 81 mg by mouth daily.  . diclofenac (VOLTAREN) 75 MG EC tablet Take 1 tablet (75 mg total) by mouth 2 (two) times daily. (Patient not taking: Reported on 01/02/2017)   No facility-administered encounter medications on file as of 01/09/2017.     Activities of Daily Living In your present state of health, do you have any difficulty performing the following activities: 01/09/2017 10/04/2016  Hearing? N N  Vision? N N  Difficulty concentrating or making decisions? N N  Walking or climbing stairs? N N  Dressing or bathing? N N  Doing errands, shopping? N -  Preparing Food and eating ? N -  Using the Toilet? N -  In the past six months, have you accidently leaked urine? N -  Do you have problems with loss of bowel control? N -  Managing your Medications? N -  Managing your Finances? N -  Housekeeping or managing your Housekeeping? N -  Some recent data might be hidden    Patient Care Team: Ria Bush, MD as PCP -  General (Family Medicine) Ladene Artist, MD as Consulting Physician (Gastroenterology) [name], MD as Consulting Physician (General Surgery) Truitt Merle, MD as Consulting Physician (Hematology)   Assessment:   This is a routine wellness examination for Mario Mcdowell.  Exercise Activities and Dietary recommendations Current Exercise Habits: The patient has a physically strenous job, but has no regular exercise apart from work., Exercise limited by: None identified  Goals    . Follow up with Primary Care Provider     Starting 01/09/2017, I will continue to take medications as prescribed and to keep appointments as scheduled with PCP and other specialists.        Fall Risk Fall Risk  01/09/2017 01/06/2016  Falls in the past year? Yes No  Number falls in past yr: 1 -  Injury with Fall? No -   Depression Screen PHQ 2/9 Scores 01/09/2017 01/06/2016  PHQ - 2 Score 0 0  PHQ- 9 Score 0 -    Cognitive Function MMSE - Mini Mental State Exam 01/09/2017  Orientation to time 5  Orientation to Place 5  Registration 3  Attention/ Calculation 0  Recall 3  Language- name 2 objects 0  Language- repeat 1  Language- follow 3 step command 3  Language- read & follow direction 0  Write a sentence 0  Copy design 0  Total score 20        Immunization History  Administered Date(s) Administered  . Influenza,inj,Quad PF,6+ Mos 12/05/2016    Screening Tests Health Maintenance  Topic Date Due  . DTaP/Tdap/Td (1 - Tdap) 01/09/2018 (Originally 08/16/1969)  . TETANUS/TDAP  01/09/2018 (Originally 08/16/1969)  . PNA vac Low Risk Adult (1 of 2 - PCV13) 01/09/2018 (Originally 08/17/2015)  . COLONOSCOPY  03/12/2017  . INFLUENZA VACCINE  Completed  . Hepatitis C Screening  Completed   Plan:     I have personally reviewed, addressed, and noted the following in the patient's chart:  A. Medical and social history B. Use of alcohol, tobacco or illicit drugs  C. Current medications and  supplements D. Functional ability and status E.  Nutritional status F.  Physical activity G. Advance directives H. List of other physicians I.  Hospitalizations, surgeries, and ER visits in previous 12 months J.  [place] to include hearing, vision, cognitive, depression L. Referrals and appointments - none  In addition, I have reviewed and discussed with patient certain preventive protocols, quality metrics, and best practice recommendations. A written personalized care plan for preventive services as well as general preventive health recommendations were provided to patient.  See attached scanned questionnaire for additional information.   Signed,   Lindell Noe, MHA, BS, LPN Health Coach

## 2017-01-09 NOTE — Patient Instructions (Addendum)
Mario Mcdowell , Thank you for taking time to come for your Medicare Wellness Visit. I appreciate your ongoing commitment to your health goals. Please review the following plan we discussed and let me know if I can assist you in the future.   These are the goals we discussed: Goals    . Follow up with Primary Care Provider     Starting 01/09/2017, I will continue to take medications as prescribed and to keep appointments as scheduled with PCP and other specialists.        This is a list of the screening recommended for you and due dates:  Health Maintenance  Topic Date Due  . DTaP/Tdap/Td vaccine (1 - Tdap) 01/09/2018*  . Tetanus Vaccine  01/09/2018*  . Pneumonia vaccines (1 of 2 - PCV13) 01/09/2018*  . Colon Cancer Screening  03/12/2017  . Flu Shot  Completed  .  Hepatitis C: One time screening is recommended by Center for Disease Control  (CDC) for  adults born from [birth year] through 1965.   Completed  *Topic was postponed. The date shown is not the original due date.   Preventive Care for Adults  A healthy lifestyle and preventive care can promote health and wellness. Preventive health guidelines for adults include the following key practices.  . A routine yearly physical is a good way to check with your health care provider about your health and preventive screening. It is a chance to share any concerns and updates on your health and to receive a thorough exam.  . Visit your dentist for a routine exam and preventive care every 6 months. Brush your teeth twice a day and floss once a day. Good oral hygiene prevents tooth decay and gum disease.  . The frequency of eye exams is based on your age, health, family medical history, use  of contact lenses, and other factors. Follow your health care provider's recommendations for frequency of eye exams.  . Eat a healthy diet. Foods like vegetables, fruits, whole grains, low-fat dairy products, and lean protein foods contain the nutrients you need  without too many calories. Decrease your intake of foods high in solid fats, added sugars, and salt. Eat the right amount of calories for you. Get information about a proper diet from your health care provider, if necessary.  . Regular physical exercise is one of the most important things you can do for your health. Most adults should get at least 150 minutes of moderate-intensity exercise (any activity that increases your heart rate and causes you to sweat) each week. In addition, most adults need muscle-strengthening exercises on 2 or more days a week.  Silver Sneakers may be a benefit available to you. To determine eligibility, you may visit the website: www.silversneakers.com or contact program at (929)574-6558 Mon-Fri between 8AM-8PM.   . Maintain a healthy weight. The body mass index (BMI) is a screening tool to identify possible weight problems. It provides an estimate of body fat based on height and weight. Your health care provider can find your BMI and can help you achieve or maintain a healthy weight.   For adults 20 years and older: ? A BMI below 18.5 is considered underweight. ? A BMI of 18.5 to 24.9 is normal. ? A BMI of 25 to 29.9 is considered overweight. ? A BMI of 30 and above is considered obese.   . Maintain normal blood lipids and cholesterol levels by exercising and minimizing your intake of saturated fat. Eat a balanced diet with plenty  of fruit and vegetables. Blood tests for lipids and cholesterol should begin at age 76 and be repeated every 5 years. If your lipid or cholesterol levels are high, you are over 50, or you are at high risk for heart disease, you may need your cholesterol levels checked more frequently. Ongoing high lipid and cholesterol levels should be treated with medicines if diet and exercise are not working.  . If you smoke, find out from your health care provider how to quit. If you do not use tobacco, please do not start.  . If you choose to drink  alcohol, please do not consume more than 2 drinks per day. One drink is considered to be 12 ounces (355 mL) of beer, 5 ounces (148 mL) of wine, or 1.5 ounces (44 mL) of liquor.  . If you are 23-33 years old, ask your health care provider if you should take aspirin to prevent strokes.  . Use sunscreen. Apply sunscreen liberally and repeatedly throughout the day. You should seek shade when your shadow is shorter than you. Protect yourself by wearing long sleeves, pants, a wide-brimmed hat, and sunglasses year round, whenever you are outdoors.  . Once a month, do a whole body skin exam, using a mirror to look at the skin on your back. Tell your health care provider of new moles, moles that have irregular borders, moles that are larger than a pencil eraser, or moles that have changed in shape or color.

## 2017-01-09 NOTE — Progress Notes (Signed)
Pre visit review using our clinic review tool, if applicable. No additional management support is needed unless otherwise documented below in the visit note. 

## 2017-01-09 NOTE — Progress Notes (Signed)
I reviewed health advisor's note, was available for consultation, and agree with documentation and plan.  

## 2017-01-10 ENCOUNTER — Ambulatory Visit (HOSPITAL_COMMUNITY)
Admission: RE | Admit: 2017-01-10 | Discharge: 2017-01-10 | Disposition: A | Discharge status: Home / Self Care | Payer: Medicare Other | Source: Ambulatory Visit | Attending: Vascular Surgery | Admitting: Vascular Surgery

## 2017-01-10 DIAGNOSIS — R0989 Other specified symptoms and signs involving the circulatory and respiratory systems: Secondary | ICD-10-CM | POA: Diagnosis not present

## 2017-01-10 DIAGNOSIS — I6523 Occlusion and stenosis of bilateral carotid arteries: Secondary | ICD-10-CM | POA: Insufficient documentation

## 2017-01-10 LAB — VAS US CAROTID
LCCADDIAS: 32 cm/s
LEFT ECA DIAS: 28 cm/s
LICADSYS: -112 cm/s
LICAPSYS: -109 cm/s
Left CCA dist sys: 101 cm/s
Left CCA prox dias: 43 cm/s
Left CCA prox sys: 144 cm/s
Left ICA dist dias: -41 cm/s
Left ICA prox dias: -45 cm/s
RCCAPSYS: 100 cm/s
RIGHT CCA MID DIAS: 35 cm/s
RIGHT ECA DIAS: -27 cm/s
Right CCA prox dias: 32 cm/s
Right cca dist sys: -82 cm/s

## 2017-01-11 ENCOUNTER — Ambulatory Visit (INDEPENDENT_AMBULATORY_CARE_PROVIDER_SITE_OTHER): Payer: Medicare Other | Admitting: Family Medicine

## 2017-01-11 ENCOUNTER — Encounter: Payer: Self-pay | Admitting: Family Medicine

## 2017-01-11 VITALS — BP 118/80 | HR 77 | Temp 98.2°F | Ht 65.0 in | Wt 119.0 lb

## 2017-01-11 DIAGNOSIS — I70299 Other atherosclerosis of native arteries of extremities, unspecified extremity: Secondary | ICD-10-CM

## 2017-01-11 DIAGNOSIS — F17200 Nicotine dependence, unspecified, uncomplicated: Secondary | ICD-10-CM

## 2017-01-11 DIAGNOSIS — M79642 Pain in left hand: Secondary | ICD-10-CM

## 2017-01-11 DIAGNOSIS — I6522 Occlusion and stenosis of left carotid artery: Secondary | ICD-10-CM | POA: Diagnosis not present

## 2017-01-11 DIAGNOSIS — E7800 Pure hypercholesterolemia, unspecified: Secondary | ICD-10-CM | POA: Diagnosis not present

## 2017-01-11 DIAGNOSIS — I700 Atherosclerosis of aorta: Secondary | ICD-10-CM

## 2017-01-11 DIAGNOSIS — C187 Malignant neoplasm of sigmoid colon: Secondary | ICD-10-CM

## 2017-01-11 DIAGNOSIS — C772 Secondary and unspecified malignant neoplasm of intra-abdominal lymph nodes: Secondary | ICD-10-CM | POA: Diagnosis not present

## 2017-01-11 DIAGNOSIS — Z0000 Encounter for general adult medical examination without abnormal findings: Secondary | ICD-10-CM | POA: Insufficient documentation

## 2017-01-11 DIAGNOSIS — I708 Atherosclerosis of other arteries: Secondary | ICD-10-CM

## 2017-01-11 DIAGNOSIS — Z7189 Other specified counseling: Secondary | ICD-10-CM

## 2017-01-11 DIAGNOSIS — Z0001 Encounter for general adult medical examination with abnormal findings: Secondary | ICD-10-CM | POA: Insufficient documentation

## 2017-01-11 MED ORDER — LOVASTATIN 40 MG PO TABS: 40.0000 mg | ORAL_TABLET | Freq: Every day | ORAL | 3 refills | Status: DC

## 2017-01-11 NOTE — Patient Instructions (Addendum)
Think about pneumonia shot.  Try lovastatin new cholesterol medicine. Let me know how you tolerate this.  Good to see you today. Return as needed or in 1 year for next medicare wellness visit with Katha Cabal and physical with me.   Health Maintenance, Male A healthy lifestyle and preventive care is important for your health and wellness. Ask your health care provider about what schedule of regular examinations is right for you. What should I know about weight and diet? Eat a Healthy Diet  Eat plenty of vegetables, fruits, whole grains, low-fat dairy products, and lean protein.  Do not eat a lot of foods high in solid fats, added sugars, or salt.  Maintain a Healthy Weight Regular exercise can help you achieve or maintain a healthy weight. You should:  Do at least 150 minutes of exercise each week. The exercise should increase your heart rate and make you sweat (moderate-intensity exercise).  Do strength-training exercises at least twice a week.  Watch Your Levels of Cholesterol and Blood Lipids  Have your blood tested for lipids and cholesterol every 5 years starting at 66 years of age. If you are at high risk for heart disease, you should start having your blood tested when you are 66 years old. You may need to have your cholesterol levels checked more often if: ? Your lipid or cholesterol levels are high. ? You are older than 66 years of age. ? You are at high risk for heart disease.  What should I know about cancer screening? Many types of cancers can be detected early and may often be prevented. Lung Cancer  You should be screened every year for lung cancer if: ? You are a current smoker who has smoked for at least 30 years. ? You are a former smoker who has quit within the past 15 years.  Talk to your health care provider about your screening options, when you should start screening, and how often you should be screened.  Colorectal Cancer  Routine colorectal cancer screening  usually begins at 66 years of age and should be repeated every 5-10 years until you are 66 years old. You may need to be screened more often if early forms of precancerous polyps or small growths are found. Your health care provider may recommend screening at an earlier age if you have risk factors for colon cancer.  Your health care provider may recommend using home test kits to check for hidden blood in the stool.  A small camera at the end of a tube can be used to examine your colon (sigmoidoscopy or colonoscopy). This checks for the earliest forms of colorectal cancer.  Prostate and Testicular Cancer  Depending on your age and overall health, your health care provider may do certain tests to screen for prostate and testicular cancer.  Talk to your health care provider about any symptoms or concerns you have about testicular or prostate cancer.  Skin Cancer  Check your skin from head to toe regularly.  Tell your health care provider about any new moles or changes in moles, especially if: ? There is a change in a mole's size, shape, or color. ? You have a mole that is larger than a pencil eraser.  Always use sunscreen. Apply sunscreen liberally and repeat throughout the day.  Protect yourself by wearing long sleeves, pants, a wide-brimmed hat, and sunglasses when outside.  What should I know about heart disease, diabetes, and high blood pressure?  If you are 58-74 years of age,  have your blood pressure checked every 3-5 years. If you are 87 years of age or older, have your blood pressure checked every year. You should have your blood pressure measured twice-once when you are at a hospital or clinic, and once when you are not at a hospital or clinic. Record the average of the two measurements. To check your blood pressure when you are not at a hospital or clinic, you can use: ? An automated blood pressure machine at a pharmacy. ? A home blood pressure monitor.  Talk to your health care  provider about your target blood pressure.  If you are between 75-17 years old, ask your health care provider if you should take aspirin to prevent heart disease.  Have regular diabetes screenings by checking your fasting blood sugar level. ? If you are at a normal weight and have a low risk for diabetes, have this test once every three years after the age of 63. ? If you are overweight and have a high risk for diabetes, consider being tested at a younger age or more often.  A one-time screening for abdominal aortic aneurysm (AAA) by ultrasound is recommended for men aged 7-75 years who are current or former smokers. What should I know about preventing infection? Hepatitis B If you have a higher risk for hepatitis B, you should be screened for this virus. Talk with your health care provider to find out if you are at risk for hepatitis B infection. Hepatitis C Blood testing is recommended for:  Everyone born from [birth year] through 1965.  Anyone with known risk factors for hepatitis C.  Sexually Transmitted Diseases (STDs)  You should be screened each year for STDs including gonorrhea and chlamydia if: ? You are sexually active and are younger than 66 years of age. ? You are older than 66 years of age and your health care provider tells you that you are at risk for this type of infection. ? Your sexual activity has changed since you were last screened and you are at an increased risk for chlamydia or gonorrhea. Ask your health care provider if you are at risk.  Talk with your health care provider about whether you are at high risk of being infected with HIV. Your health care provider may recommend a prescription medicine to help prevent HIV infection.  What else can I do?  Schedule regular health, dental, and eye exams.  Stay current with your vaccines (immunizations).  Do not use any tobacco products, such as cigarettes, chewing tobacco, and e-cigarettes. If you need help quitting, ask  your health care provider.  Limit alcohol intake to no more than 2 drinks per day. One drink equals 12 ounces of beer, 5 ounces of wine, or 1 ounces of hard liquor.  Do not use street drugs.  Do not share needles.  Ask your health care provider for help if you need support or information about quitting drugs.  Tell your health care provider if you often feel depressed.  Tell your health care provider if you have ever been abused or do not feel safe at home. This information is not intended to replace advice given to you by your health care provider. Make sure you discuss any questions you have with your health care provider. Document Released: 06/30/2007 Document Revised: 08/31/2015 Document Reviewed: 10/05/2014 Elsevier Interactive Patient Education  Jonpaul Schein.

## 2017-01-11 NOTE — Assessment & Plan Note (Signed)
Preventative protocols reviewed and updated unless pt declined. Discussed healthy diet and lifestyle.  

## 2017-01-11 NOTE — Assessment & Plan Note (Signed)
OA vs gout flare. Discussed urate next labs, PRN diclofenac use.

## 2017-01-11 NOTE — Assessment & Plan Note (Signed)
Continue to encourage cessation. Precontemplative.  ?

## 2017-01-11 NOTE — Assessment & Plan Note (Signed)
He had carotid US last night, pending

## 2017-01-11 NOTE — Assessment & Plan Note (Signed)
Chronic, did not tolerate lipitor trial. Agrees to trial lovastatin.  The 10-year ASCVD risk score Mikey Bussing DC Brooke Bonito., et al., 2013) is: 18.3%   Values used to calculate the score:     Age: 66 years     Sex: Male     Is Non-Hispanic African American: No     Diabetic: No     Tobacco smoker: Yes     Systolic Blood Pressure: 517 mmHg     Is BP treated: No     HDL Cholesterol: 34.3 mg/dL     Total Cholesterol: 179 mg/dL

## 2017-01-11 NOTE — Assessment & Plan Note (Signed)
Appreciate onc care.  

## 2017-01-11 NOTE — Assessment & Plan Note (Signed)
Saw Scot Dock. Started on pletal.

## 2017-01-11 NOTE — Assessment & Plan Note (Signed)
Advanced directive discussion - discussed. Wife would be HCPOA. Packet at home.

## 2017-01-11 NOTE — Progress Notes (Signed)
BP 118/80 (BP Location: Left Arm, Patient Position: Sitting, Cuff Size: Normal)   Pulse 77   Temp 98.2 F (36.8 C) (Oral)   Ht 5\' 5"  (1.651 m)   Wt 119 lb (54 kg)   SpO2 95%   BMI 19.80 kg/m    CC: CPE Subjective:    Patient ID: Mario Mcdowell, male    DOB: 1950/06/15, 66 y.o.   MRN: [id number]  HPI: Mario Mcdowell is a 66 y.o. male presenting on 01/11/2017 for Annual Exam (Pt 2)   Saw Katha Cabal last week for medicare wellness visit. Note reviewed. Failed hearing screen on right. Recent swollen L 3rd MCP treated with oral voltaren - thought OA flare. swleling has improved, but pain remains. Denies inciting trauma or injury.   PAD with claudication - saw Dr Scot Dock - started on pletal and structured walk program. Possible candidate for axillobifemoral bypass if progressing symptoms. Pending carotid US results. Even 1/2 lipitor caused malaise.   Continued smoker 1/2+ ppd. Pre-contemplative.   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  Lung cancer screening - contrasted CT 04/2016 reassuring Flu shot - declines Pneumonia shot - declines Tetanus shot - declines Shingles shot - declines Advanced directive discussion - discussed. Wife would be HCPOA. Packet at home.  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 Fruits/vegetables daily.   Relevant past medical, surgical, family and social history reviewed and updated as indicated. Interim medical history since our last visit reviewed. Allergies and medications reviewed and updated. Outpatient Medications Prior  to Visit  Medication Sig Dispense Refill  . aspirin EC 81 MG tablet Take 81 mg by mouth daily.    . cilostazol (PLETAL) 100 MG tablet Take 1 tablet (100 mg total) by mouth 2 (two) times daily before a meal. 60 tablet 11  . diclofenac (VOLTAREN) 75 MG EC tablet Take 1 tablet (75 mg total) by mouth 2 (two) times daily. 30 tablet 0   No facility-administered medications prior to visit.      Per HPI unless specifically indicated in ROS section below Review of Systems  Constitutional: Negative for activity change, appetite change, chills, fatigue, fever and unexpected weight change.  HENT: Negative for hearing loss.   Eyes: Negative for visual disturbance.  Respiratory: Negative for cough, chest tightness, shortness of breath and wheezing.   Cardiovascular: Negative for chest pain, palpitations and leg swelling.  Gastrointestinal: Negative for abdominal distention, abdominal pain, blood in stool, constipation, diarrhea, nausea and vomiting.  Genitourinary: Negative for difficulty urinating and hematuria.  Musculoskeletal: Negative for arthralgias, myalgias and neck pain.  Skin: Negative for rash.  Neurological: Negative for dizziness, seizures, syncope and headaches.  Hematological: Negative for adenopathy. Does not bruise/bleed easily.  Psychiatric/Behavioral: Negative for dysphoric mood. The patient is not nervous/anxious.        Objective:    BP 118/80 (BP Location: Left Arm, Patient Position: Sitting, Cuff Size: Normal)   Pulse 77   Temp 98.2 F (36.8 C) (Oral)   Ht 5\' 5"  (1.651 m)   Wt 119 lb (54 kg)   SpO2 95%   BMI  19.80 kg/m   Wt Readings from Last 3 Encounters:  01/11/17 119 lb (54 kg)  01/09/17 117 lb 4 oz (53.2 kg)  01/02/17 119 lb 6.4 oz (54.2 kg)    Physical Exam  Constitutional: He is oriented to person, place, and time. He appears well-developed and well-nourished. No distress.  HENT:  Head: Normocephalic and atraumatic.  Right Ear: Hearing, tympanic membrane,  external ear and ear canal normal.  Left Ear: Hearing, tympanic membrane, external ear and ear canal normal.  Nose: Nose normal.  Mouth/Throat: Uvula is midline, oropharynx is clear and moist and mucous membranes are normal. No oropharyngeal exudate, posterior oropharyngeal edema or posterior oropharyngeal erythema.  Eyes: Conjunctivae and EOM are normal. Pupils are equal, round, and reactive to light. No scleral icterus.  Neck: Normal range of motion. Neck supple. Carotid bruit is present (L sided). No thyromegaly present.  Cardiovascular: Normal rate, regular rhythm, normal heart sounds and intact distal pulses.  No murmur heard. Pulses:      Radial pulses are 2+ on the right side, and 2+ on the left side.  Pulmonary/Chest: Effort normal and breath sounds normal. No respiratory distress. He has no wheezes. He has no rales.  Abdominal: Soft. Bowel sounds are normal. He exhibits no distension and no mass. There is no tenderness. There is no rebound and no guarding.  Musculoskeletal: Normal range of motion. He exhibits no edema.  Lymphadenopathy:    He has no cervical adenopathy.  Neurological: He is alert and oriented to person, place, and time.  CN grossly intact, station and gait intact  Skin: Skin is warm and dry. No rash noted.  Psychiatric: He has a normal mood and affect. His behavior is normal. Judgment and thought content normal.  Nursing note and vitals reviewed.  Depression screen [facility] 2/9 01/09/2017  Decreased Interest 0  Down, Depressed, Hopeless 0  PHQ - 2 Score 0  Altered sleeping 0  Tired, decreased energy 0  Change in appetite 0  Feeling bad or failure about yourself  0  Trouble concentrating 0  Moving slowly or fidgety/restless 0  Suicidal thoughts 0  PHQ-9 Score 0  Difficult doing work/chores Not difficult at all      Assessment & Plan:   Problem List Items Addressed This Visit    Advanced care planning/counseling discussion    Advanced directive discussion -  discussed. Wife would be HCPOA. Packet at home.       Aorto-iliac atherosclerosis ([place])    Saw Dickson. Started on pletal.       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       Health maintenance examination - Primary    Preventative protocols reviewed and updated unless pt declined. Discussed healthy diet and lifestyle.       HLD (hyperlipidemia)    Chronic, did not tolerate lipitor trial. Agrees to trial lovastatin.  The 10-year ASCVD risk score Mikey Bussing DC Brooke Bonito., et al., 2013) is: 18.3%   Values used to calculate the score:     Age: 58 years     Sex: Male     Is Non-Hispanic African American: No     Diabetic: No     Tobacco smoker: Yes     Systolic Blood Pressure: 381 mmHg     Is BP treated: No     HDL Cholesterol: 34.3 mg/dL     Total Cholesterol:  179 mg/dL       Relevant Medications   lovastatin (MEVACOR) 40 MG tablet   Left carotid stenosis    He had carotid US last night, pending      Relevant Medications   lovastatin (MEVACOR) 40 MG tablet   Left hand pain    OA vs gout flare. Discussed urate next labs, PRN diclofenac use.       Smoker    Continue to encourage cessation. Precontemplative.           Follow up plan: Return in about 1 year (around 01/11/2018) for medicare wellness visit, annual exam, prior fasting for blood work.  Ria Bush, MD

## 2017-01-17 NOTE — Addendum Note (Signed)
Addended by: Lianne Cure A on: 01/17/2017 03:42 PM   Modules accepted: Orders

## 2017-03-05 NOTE — Progress Notes (Signed)
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 Resected and retrieved. - Rule out malignancy, tumor in the sigmoid colon. Biopsied. Tattooed. - Internal hemorrhoids. - The examination was otherwise normal on direct and retroflexion views.       03/14/2016 Imaging    CT A/P W CONTRAST  IMPRESSION: Short segment area of wall thickening in the distal sigmoid colon, suspicious for site of primary colon carcinoma. Recommend correlation with colonoscopy results.      04/23/2016 Imaging    CT Chest W Contrast IMPRESSION: 1. No evidence of pulmonary metastasis. 2. Bilateral calcified pulmonary nodules and mediastinal lymph nodes consistent with granulomatous disease. 3. Centrilobular emphysema. 4. Coronary artery calcification and aortic atherosclerotic calcification.      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 Colon, segmental resection for tumor, sigmoid - INVASIVE ADENOCARCINOMA, WELL TO MODERATELY DIFFERENTIATED, WITH  ABUNDANT MUCIN (PT3 PN1) - METASTATIC ADENOCARCINOMA IN ONE OF TWENTY-TWO LYMPH NODES (1/22) - TWO TUMOR DEPOSITS PRESENT - MARGINS UNINVOLVED BY CARCINOMA - HYPERPLASTIC POLYPS (X2) - SEE ONCOLOGY TABLE BELOW 2. Colon, resection margin (donut), final distal margin - BENIGN COLON - NO MALIGNANCY IDENTIFIED      05/21/2016 Surgery    LAPAROSCOPIC PARTIAL COLECTOMY, SPLENIC FLEXURE MOBILIZATION AND PROCTOSCOPY      06/25/2016 - 09/03/2016 Chemotherapy    FOLFOX every 2 weeks for 3 months       12/03/2016 Imaging    CT AP 12/03/16 IMPRESSION: Status post sigmoid resection. No evidence of recurrent or metastatic disease.       HISTORY OF PRESENTING ILLNESS:  Mario Mcdowell 67 y.o. male is here because of recently diagnosed colorectalcancer. He has reported to Dr. [name] on 01/24/60 for a evaluation of a rectosigmoid colon mass. 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 He denied any change in his bowel movement or habits of rectal bleeding.   He had a screening colonoscopy by Dr. Fuller Plan 03/12/2016. Who referred him to [name]. He denies any symptoms before. Before surgery, no changes in weight loss or change in energy level  Since the surgery, his appetite has been good and his energy level is fine.No pain reported from the incision. He takes hydrocodone for his pain.  August 08, 2015 he was admitted to the hospital for right lower quadrant abdominal pain due to a kidney stone.  He presents to the clinic today with his wife for consult of his stage 3 colon cancer and chemotherapy. The patient's wife says he doesn't go to the doctor often.  He is complaint and wants to do whatever he can to get treated. He is worried about what he can eat when he can start chemo.  CURRENT THERAPY: Surveillance   INTERVAL HISTORY:   Yuto Cajuste returns for follow up for his colon cancer. He presents to the clinic today noting he has been gaining weight back since he completed  chemo. He notes who is to check his prostate. He has not seen Dr. [name] since his port removal.   On review of symptoms, pt notes weight gain.  He denies abdominal pain or bowel movement change. He denies any pain.      MEDICAL HISTORY:  Past Medical History:  Diagnosis Date  . Aorto-iliac atherosclerosis ([place])    by CT; in bilateral legs   . Arthritis   . Atherosclerotic PVD with intermittent claudication ([place])   . BPH without obstruction/lower urinary tract symptoms 10/25/2015  . Centrilobular emphysema ([place])    per chest CT 04-23-2016  . Colon adenocarcinoma [facility]) oncologist- dr Burr Medico   dx 05-21-2016 -- Stage IIIB (pT3,pN1,M0) , Invasive sigmoid adenocarcinoma well to moderately differentiated with metastatic intra-abdominal lymph node (1 out of 22), s/p lap. partial colectomy [name]) 05/21/2016 and chemotherapy is planned  . Full dentures   . History of kidney stones 07/2015   passed without intervention  . Pulmonary nodules    per chest CT 04-23-2016 bilateral nodules (consistent w/ granulomatous disease)  . Smokers' cough ([place])   . Wears contact lenses     SURGICAL HISTORY: Past Surgical History:  Procedure Laterality Date  . COLONOSCOPY  03/12/2016   4 polyps, sigmoid tumor biopsied (TVA w/ MICROSCOPIC FOCUS SUSPICIOUS FOR INVASION) pending surgical resection, rpt 1 yr Fuller Plan)  . LAPAROSCOPIC PARTIAL COLECTOMY N/A 05/21/2016   colon cancer, 1/20 positive lymp nodes. LAPAROSCOPIC PARTIAL COLECTOMY, SPLENIC FLEXURE MOBILIZATION AND PROCTOSCOPY;  [name], MD)  . MULTIPLE TOOTH EXTRACTIONS  1980's  . PORT-A-CATH REMOVAL Right 10/04/2016   Procedure: REMOVAL PORT-A-CATH;  Surgeon: [name], MD;  Location: [facility];  Service: General;  Laterality: Right;  . PORTACATH PLACEMENT N/A 06/21/2016   Procedure: INSERTION PORT-A-CATH;  Surgeon: [name], MD;  Location: [facility];  Service: General;  Laterality: N/A;  right     SOCIAL HISTORY: Social History   Socioeconomic History  . Marital status: Married    Spouse name: Not on file  . Number of children: Not on file  . Years of education: Not on file  . Highest education level: Not on file  Social Needs  . Financial resource strain: Not on file  . Food insecurity - worry: Not on file  . Food insecurity - inability: Not on file  . Transportation needs - medical: Not on file  . Transportation needs - non-medical: Not on file  Occupational History  . Not on file  Tobacco Use  . Smoking status: Current Every Day Smoker    Packs/day: 1.00    Years: 46.00    Pack years: 46.00    Types: Cigarettes  . Smokeless tobacco: Never Used  . Tobacco comment: trying to quit -reduced from 1.5ppd  Substance and Sexual Activity  . Alcohol use: No    Comment: quit 2000  . Drug use: No  . Sexual activity: No    Birth control/protection: Abstinence  Other Topics Concern  . Not on file  Social History Narrative   Lives with wife, adopted daughter, adopted son   Occ: Games developer   Edu: 8th  grade     FAMILY HISTORY: Family History  Problem Relation Age of Onset  . Cancer Paternal Aunt        unsure details  . Diabetes Cousin   . Cancer Paternal Uncle        unknow type cancer   . Prostate cancer Neg Hx   . Kidney cancer Neg Hx   . CAD Neg Hx   . Stroke Neg Hx   . Colon cancer Neg Hx     ALLERGIES:  is allergic to penicillins and lipitor [atorvastatin].  MEDICATIONS:  Current Outpatient Medications  Medication Sig Dispense Refill  . aspirin EC 81 MG tablet Take 81 mg by mouth daily.    . cilostazol (PLETAL) 100 MG tablet Take 1 tablet (100 mg total) by mouth 2 (two) times daily before a meal. 60 tablet 11  . diclofenac (VOLTAREN) 75 MG EC tablet Take 1 tablet (75 mg total) by mouth 2 (two) times daily. 30 tablet 0  . lovastatin (MEVACOR) 40 MG tablet Take 1 tablet (40 mg total) by mouth at bedtime. 30 tablet 3   No current facility-administered  medications for this visit.     REVIEW OF SYSTEMS:  Constitutional: Denies fevers, chills or abnormal night sweats (+) weight gain Eyes: Denies blurriness of vision, double vision or watery eyes Ears, nose, mouth, throat, and face: Denies mucositis or sore throat Respiratory: Denies cough, dyspnea or wheezes Cardiovascular: Denies palpitation, chest discomfort or lower extremity swelling Gastrointestinal:  Denies heartburn or change in bowel habits  Skin: Denies abnormal skin rashes Lymphatics: Denies new lymphadenopathy or easy bruising  Neurological:Denies numbness, tingling or new weaknesses  Behavioral/Psych: Mood is stable, no new changes  All other systems were reviewed with the patient and are negative.  PHYSICAL EXAMINATION:  ECOG PERFORMANCE STATUS: 0  Vitals:   03/07/17 1400  BP: (!) 148/67  Pulse: 81  Resp: 18  Temp: 98.2 F (36.8 C)  SpO2: 98%   Filed Weights   03/07/17 1400  Weight: 121 lb 12.8 oz (55.2 kg)    GENERAL:alert, no distress and comfortable. Seen in infusion area, presents in treatment chair. SKIN: skin color, texture, turgor are normal, no rashes or significant lesions EYES: normal, conjunctiva are pink and non-injected, sclera clear OROPHARYNX:no exudate, no erythema and lips, buccal mucosa, and tongue normal  NECK: supple, thyroid normal size, non-tender, without nodularity LYMPH:  no palpable lymphadenopathy in the cervical, axillary or inguinal LUNGS: clear to auscultation and percussion with normal breathing effort HEART: regular rate & rhythm and no murmurs and no lower extremity edema ABDOMEN:abdomen soft, non-tender and normal bowel sounds, surgical incisions are well healed  Musculoskeletal:no cyanosis of digits and no clubbing  PSYCH: alert & oriented x 3 with fluent speech NEURO: no focal motor/sensory deficits  LABORATORY DATA:  I have reviewed the data as listed CBC Latest Ref Rng & Units 03/07/2017 12/03/2016 10/04/2016  WBC 4.0 -  10.3 K/uL 7.4 7.0 -  Hemoglobin 13.0 - 17.1 g/dL 14.2 14.9 13.4  Hematocrit 38.4 - 49.9 % 41.3 44.3 -  Platelets 140 - 400 K/uL 176 178 -   CMP Latest Ref Rng & Units 03/07/2017 12/03/2016 09/03/2016  Glucose 70 - 140 mg/dL 161(H) 108 77  BUN 7 - 26 mg/dL 14 14.4 12.5  Creatinine 0.70 - 1.30 mg/dL 1.10 0.8 0.9  Sodium 136 - 145 mmol/L 139 139 144  Potassium 3.5 - 5.1 mmol/L 4.0 4.4 4.1  Chloride 98 - 109 mmol/L 105 - -  CO2 22 - 29 mmol/L '24 26 26  '$ Calcium 8.4 - 10.4 mg/dL 9.2 9.5 9.7  Total Protein 6.4 - 8.3 g/dL 6.6 7.1 6.7  Total Bilirubin 0.2 - 1.2 mg/dL <0.2(L) <0.22 <0.22  Alkaline Phos 40 - 150 U/L 86 85 97  AST 5 - 34 U/L '18 17 24  '$ ALT 0 - 55 U/L '22 14 26    '$ Pathology report  Diagnosis 05/21/16 1. Colon, segmental resection for tumor, sigmoid - INVASIVE ADENOCARCINOMA, WELL TO MODERATELY DIFFERENTIATED, WITH ABUNDANT MUCIN (PT3 PN1) - METASTATIC ADENOCARCINOMA IN ONE OF TWENTY-TWO LYMPH NODES (1/22) - TWO TUMOR DEPOSITS PRESENT - MARGINS UNINVOLVED BY CARCINOMA - HYPERPLASTIC POLYPS (X2) - SEE ONCOLOGY TABLE BELOW 2. Colon, resection margin (donut), final distal margin - BENIGN COLON - NO MALIGNANCY IDENTIFIED Microscopic Comment 1. ONCOLOGY TABLE: COLON Specimen: Sigmoid colon Procedure: Sigmoidectomy Tumor site: Sigmoid Macroscopic tumor perforation: Not identified Invasive tumor: 3.4 x 2.5 x 0.6 cm Histologic type(s): Adenocarcinoma with abundant mucin Histologic grade and differentiation: G1- G2 Microscopic extension of invasive tumor: Tumor invades muscularis propria Lymph-Vascular invasion: Not identified Perineural invasion: Not identified Treatment Effect: No known presurgical therapy Tumor deposit(s): Present, 2 Resection margins: All margins are uninvolved by invasive carcinoma, high-grade dysplasia, intramucosal adenocarcinoma, and adenoma Margins examined: Proximal, distal and Radial Distance closest margin (if all above margins negative): 3.6 cm  (distal) Additional polyp(s): Hyperplastic polyps (x2) Lymph nodes: number examined 22; number positive: 1 1 of 5 Supplemental copy SUPPLEMENTAL for MACIO, KISSOON [phone]) Microscopic Comment(continued) Pathologic Staging: pT3, pN1 Ancillary studies: performed on block 1G (MMR by immunohistochemistry and MSI by PCR; will be reported separately.) Dr. Lyndon Code has reviewed the pertinent slide(1E) and agrees that adenocarcinoma is present in this sample.  RADIOGRAPHIC STUDIES: I have personally reviewed the radiological images as listed and agreed with the findings in the report. No results found.   CT CHEST W CONTRAST 04/23/16 IMPRESSION: 1. No evidence of pulmonary metastasis. 2. Bilateral calcified pulmonary nodules and mediastinal lymph nodes consistent with granulomatous disease. 3. Centrilobular emphysema. 4. Coronary artery calcification and aortic atherosclerotic calcification.  CT A/P W CONTRAST 03/14/16 IMPRESSION: Short segment area of wall thickening in the distal sigmoid colon, suspicious for site of primary colon carcinoma. Recommend correlation with colonoscopy results.  No evidence of local or distant metastatic disease. No evidence of bowel obstruction.  PROCEDURES Colonoscopy 03/12/2016 Four 6 to 8 mm polyps in the rectum and in the transverse colon, removed with a cold snare. Resected and retrieved. - Rule out malignancy, tumor in the sigmoid colon. Biopsied. Tattooed. - Internal hemorrhoids. - The examination was otherwise normal on direct and retroflexion views.   ASSESSMENT & PLAN:  67 y.o.  male  1. 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 -We previously discussed the risk of cancer recurrence after surgery. Due to his stage IIIB  disease, he is at high risk for recurrence -We previously discussed the standard adjuvant chemotherapy for stage III colon cancer,  reduce the risk of cancer recurrence. -We previously discussed the regimens for adjuvant chemotherapy, including FOLFOX, CAPOX, or single agent Xeloda or 5-FU. He is 4, but does not have many medical comorbidities and recovered from surgery quickly. I recommended him to consider FOLFOX -We previously discussed the duration of adjuvant chemotherapy. Based on the recent reported IDEA study  in ASCO 2017, I recommended 3 months therapy.  -He underwent adjuvant chemotherapy 06/25/16-09/03/16, tolerated well overall, mild nausea, no neuropathy. -Port was removed on 10/04/16 by Dr. [name]  -I reviewed his surveillance CT AP from 12/03/16 which showed no evidence of recurrence.  -Labs reviewed, CBC WNL, previous CEA normalized to 4.61. Today' marker is still pending. His BG is 161 today. Given he may be borderline diabatic I advised him to watch his diet and lessen his sugar intake. He should regularly check his BG and HB1C with his PCP. No clinical concern for recurrence.  -He is due for colonoscopy this year. Will send message to Dr. Traci Sermon about this.  -Repeat scan in 6 months, then yearly for the first 2-3 years.  -F/u in 3 months    2. Smoking cessation  -He is still smoking 1 pack or less a day. He has been trying to quit, but unsuccessful.  -I again strongly encouraged him to work towards quitting. I recommended Nicotine patches or counseling to help him quit. He declined at this time.   3. Emphysema  4. BPH -01/09/17 PSA normal at 0.55 -Will continue to be followed by his PCP.   5. History of kidney stones   PLAN -Lab and f/u in 3 months, will order CT scan on next visit   No orders of the defined types were placed in this encounter.   All questions were answered. The patient knows to call the clinic with any problems, questions or concerns.  I spent 15  minutes counseling the patient face to face. The total time spent in the appointment was 20 minutes and more than 50% was on counseling.    Truitt Merle, MD 03/07/2017   This document serves as a record of services personally performed by Truitt Merle, MD. It was created on her behalf by Joslyn Devon, a trained medical scribe. The creation of this record is based on the scribe's personal observations and the provider's statements to them.    I have reviewed the above documentation for accuracy and completeness, and I agree with the above.

## 2017-03-07 ENCOUNTER — Inpatient Hospital Stay: Payer: Medicare Other

## 2017-03-07 ENCOUNTER — Inpatient Hospital Stay: Payer: Medicare Other | Attending: Hematology | Admitting: Hematology

## 2017-03-07 ENCOUNTER — Telehealth: Payer: Self-pay | Admitting: Hematology

## 2017-03-07 ENCOUNTER — Encounter: Payer: Self-pay | Admitting: Hematology

## 2017-03-07 VITALS — BP 148/67 | HR 81 | Temp 98.2°F | Resp 18 | Ht 65.0 in | Wt 121.8 lb

## 2017-03-07 DIAGNOSIS — C772 Secondary and unspecified malignant neoplasm of intra-abdominal lymph nodes: Principal | ICD-10-CM

## 2017-03-07 DIAGNOSIS — C187 Malignant neoplasm of sigmoid colon: Secondary | ICD-10-CM | POA: Insufficient documentation

## 2017-03-07 DIAGNOSIS — I700 Atherosclerosis of aorta: Secondary | ICD-10-CM | POA: Diagnosis not present

## 2017-03-07 DIAGNOSIS — R918 Other nonspecific abnormal finding of lung field: Secondary | ICD-10-CM | POA: Insufficient documentation

## 2017-03-07 DIAGNOSIS — Z87442 Personal history of urinary calculi: Secondary | ICD-10-CM | POA: Diagnosis not present

## 2017-03-07 DIAGNOSIS — J432 Centrilobular emphysema: Secondary | ICD-10-CM | POA: Diagnosis not present

## 2017-03-07 DIAGNOSIS — N400 Enlarged prostate without lower urinary tract symptoms: Secondary | ICD-10-CM | POA: Insufficient documentation

## 2017-03-07 DIAGNOSIS — I2510 Atherosclerotic heart disease of native coronary artery without angina pectoris: Secondary | ICD-10-CM | POA: Insufficient documentation

## 2017-03-07 DIAGNOSIS — I739 Peripheral vascular disease, unspecified: Secondary | ICD-10-CM | POA: Insufficient documentation

## 2017-03-07 DIAGNOSIS — Z79899 Other long term (current) drug therapy: Secondary | ICD-10-CM | POA: Diagnosis not present

## 2017-03-07 DIAGNOSIS — Z7982 Long term (current) use of aspirin: Secondary | ICD-10-CM | POA: Insufficient documentation

## 2017-03-07 DIAGNOSIS — Z9221 Personal history of antineoplastic chemotherapy: Secondary | ICD-10-CM | POA: Diagnosis not present

## 2017-03-07 DIAGNOSIS — F17210 Nicotine dependence, cigarettes, uncomplicated: Secondary | ICD-10-CM | POA: Insufficient documentation

## 2017-03-07 DIAGNOSIS — Z800 Family history of malignant neoplasm of digestive organs: Secondary | ICD-10-CM | POA: Diagnosis not present

## 2017-03-07 LAB — COMPREHENSIVE METABOLIC PANEL
ALK PHOS: 86 U/L (ref 40–150)
ALT: 22 U/L (ref 0–55)
AST: 18 U/L (ref 5–34)
Albumin: 3.4 g/dL — ABNORMAL LOW (ref 3.5–5.0)
Anion gap: 10 (ref 3–11)
BUN: 14 mg/dL (ref 7–26)
CALCIUM: 9.2 mg/dL (ref 8.4–10.4)
CO2: 24 mmol/L (ref 22–29)
CREATININE: 1.1 mg/dL (ref 0.70–1.30)
Chloride: 105 mmol/L (ref 98–109)
GFR calc Af Amer: >60 mL/min (ref >60)
GFR calc non Af Amer: >60 mL/min (ref >60)
GLUCOSE: 161 mg/dL — AB (ref 70–140)
Potassium: 4 mmol/L (ref 3.5–5.1)
SODIUM: 139 mmol/L (ref 136–145)
Total Bilirubin: <0.2 mg/dL — ABNORMAL LOW (ref 0.2–1.2)
Total Protein: 6.6 g/dL (ref 6.4–8.3)

## 2017-03-07 LAB — CBC WITH DIFFERENTIAL/PLATELET
BASOS PCT: 0 %
Basophils Absolute: 0 10*3/uL (ref 0.0–0.1)
EOS ABS: 0.1 10*3/uL (ref 0.0–0.5)
Eosinophils Relative: 1 %
HEMATOCRIT: 41.3 % (ref 38.4–49.9)
Hemoglobin: 14.2 g/dL (ref 13.0–17.1)
Lymphocytes Relative: 31 %
Lymphs Abs: 2.3 10*3/uL (ref 0.9–3.3)
MCH: 31.9 pg (ref 27.2–33.4)
MCHC: 34.4 g/dL (ref 32.0–36.0)
MCV: 92.8 fL (ref 79.3–98.0)
MONO ABS: 0.8 10*3/uL (ref 0.1–0.9)
MONOS PCT: 10 %
NEUTROS ABS: 4.3 10*3/uL (ref 1.5–6.5)
Neutrophils Relative %: 58 %
Platelets: 176 10*3/uL (ref 140–400)
RBC: 4.45 MIL/uL (ref 4.20–5.82)
RDW: 15.1 % — AB (ref 11.0–14.6)
WBC: 7.4 10*3/uL (ref 4.0–10.3)

## 2017-03-07 LAB — CEA (IN HOUSE-CHCC): CEA (CHCC-In House): 3.71 ng/mL (ref 0.00–5.00)

## 2017-03-07 NOTE — Telephone Encounter (Signed)
Appointment scheudled AVS/Calendar printed per 2/21 los

## 2017-03-08 ENCOUNTER — Encounter: Payer: Self-pay | Admitting: Gastroenterology

## 2017-04-30 ENCOUNTER — Telehealth: Payer: Self-pay

## 2017-04-30 ENCOUNTER — Ambulatory Visit (INDEPENDENT_AMBULATORY_CARE_PROVIDER_SITE_OTHER): Payer: Medicare Other | Admitting: Gastroenterology

## 2017-04-30 ENCOUNTER — Encounter: Payer: Self-pay | Admitting: Gastroenterology

## 2017-04-30 VITALS — BP 102/60 | HR 74 | Ht 63.75 in | Wt 120.1 lb

## 2017-04-30 DIAGNOSIS — R197 Diarrhea, unspecified: Secondary | ICD-10-CM

## 2017-04-30 DIAGNOSIS — Z85038 Personal history of other malignant neoplasm of large intestine: Secondary | ICD-10-CM

## 2017-04-30 MED ORDER — NA SULFATE-K SULFATE-MG SULF 17.5-3.13-1.6 GM/177ML PO SOLN: 1.0000 | Freq: Once | ORAL | 0 refills | Status: AC

## 2017-04-30 NOTE — Telephone Encounter (Signed)
   Mario Mcdowell Jun 06, 1950 [id number]  Dear Dr. Scot Dock:  We have scheduled the above named patient for a(n) Colonoscopy procedure. Our records show that (s)he is on anticoagulation therapy.  Please advise as to whether the patient may come off their therapy of Pletal 5 days prior to their procedure which is scheduled for 05/29/17.  Please route your response to [name], CMA or fax response to [phone].  Sincerely,    [facility]

## 2017-04-30 NOTE — Patient Instructions (Signed)
You have been scheduled for a colonoscopy. Please follow written instructions given to you at your visit today.  Please pick up your prep supplies at the pharmacy within the next 1-3 days. If you use inhalers (even only as needed), please bring them with you on the day of your procedure. Your physician has requested that you go to www.startemmi.com and enter the access code given to you at your visit today. This web site gives a general overview about your procedure. However, you should still follow specific instructions given to you by our office regarding your preparation for the procedure.  Thank you for choosing me and [facility].  Malcolm T. Stark, Jr., MD., FACG  

## 2017-04-30 NOTE — Telephone Encounter (Signed)
Anticoagulation  Received: Today  Message Contents  Angelia Mould, MD  Marzella Schlein, CMA        This patient may come off of Pletal 5 days prior to the procedure.  Thanks

## 2017-04-30 NOTE — Progress Notes (Signed)
    History of Present Illness: This is a 67 year old male with stage IIIB colon cancer S/P sigmoid colectomy in 05/2016. Followed by Oncology, considering FOLFOX adjuvant chemotherapy. CT scan in 11/2016 showed no evidence of metastatic disease.  He states he has had intermittent problems with loose stools since beginning Pletal.  He has a follow-up appointment with his vascular surgeon in June.  No other gastrointestinal complaints.  Current Medications, Allergies, Past Medical History, Past Surgical History, Family History and Social History were reviewed in [employer] record.  Physical Exam: General: Well developed, well nourished, no acute distress Head: Normocephalic and atraumatic Eyes:  sclerae anicteric, EOMI Ears: Normal auditory acuity Mouth: No deformity or lesions Lungs: Clear throughout to auscultation Heart: Regular rate and rhythm; no murmurs, rubs or bruits Abdomen: Soft, non tender and non distended. No masses, hepatosplenomegaly or hernias noted. Normal Bowel sounds Rectal: Deferred to colonoscopy Musculoskeletal: Symmetrical with no gross deformities  Pulses:  Normal pulses noted Extremities: No clubbing, cyanosis, edema or deformities noted Neurological: Alert oriented x 4, grossly nonfocal Psychological:  Alert and cooperative. Normal mood and affect  Assessment and Recommendations:  1.  Personal history of stage IIIB colon cancer status post sigmoid colectomy in May 2018.  Intermittent mild diarrhea.  Schedule colonoscopy. The risks (including bleeding, perforation, infection, missed lesions, medication reactions and possible hospitalization or surgery if complications occur), benefits, and alternatives to colonoscopy with possible biopsy and possible polypectomy were discussed with the patient and they consent to proceed.   2.  Peripheral vascular disease maintained on Pletal. Hold Pletal 5 days before procedure - will instruct when and how to  resume after procedure. Low but real risk of cardiovascular event such as heart attack, stroke, embolism, thrombosis or ischemia/infarct of other organs off Pletal explained and need to seek urgent help if this occurs. The patient consents to proceed. Will communicate by phone or EMR with patient's prescribing provider to confirm that holding Pletal is reasonable in this case.

## 2017-05-02 NOTE — Telephone Encounter (Signed)
Left a message for patient to return my call. 

## 2017-05-06 NOTE — Telephone Encounter (Signed)
Left a message for patient to return my call. 

## 2017-05-06 NOTE — Telephone Encounter (Signed)
Informed patient he can hold his Pletal 5 days prior to his procedure per Dr. Scot Dock. Patient verbalized understanding.

## 2017-05-15 HISTORY — PX: COLONOSCOPY: SHX174

## 2017-05-17 ENCOUNTER — Encounter: Payer: Self-pay | Admitting: Gastroenterology

## 2017-05-29 ENCOUNTER — Encounter: Payer: Self-pay | Admitting: Gastroenterology

## 2017-05-29 ENCOUNTER — Ambulatory Visit (AMBULATORY_SURGERY_CENTER): Payer: Medicare Other | Admitting: Gastroenterology

## 2017-05-29 ENCOUNTER — Other Ambulatory Visit: Payer: Self-pay

## 2017-05-29 VITALS — BP 110/72 | HR 66 | Temp 98.7°F | Resp 18 | Ht 63.75 in | Wt 120.0 lb

## 2017-05-29 DIAGNOSIS — D124 Benign neoplasm of descending colon: Secondary | ICD-10-CM | POA: Diagnosis not present

## 2017-05-29 DIAGNOSIS — Z85038 Personal history of other malignant neoplasm of large intestine: Secondary | ICD-10-CM | POA: Diagnosis not present

## 2017-05-29 DIAGNOSIS — I739 Peripheral vascular disease, unspecified: Secondary | ICD-10-CM | POA: Diagnosis not present

## 2017-05-29 DIAGNOSIS — K635 Polyp of colon: Secondary | ICD-10-CM | POA: Diagnosis not present

## 2017-05-29 DIAGNOSIS — D123 Benign neoplasm of transverse colon: Secondary | ICD-10-CM

## 2017-05-29 DIAGNOSIS — J439 Emphysema, unspecified: Secondary | ICD-10-CM | POA: Diagnosis not present

## 2017-05-29 DIAGNOSIS — R197 Diarrhea, unspecified: Secondary | ICD-10-CM | POA: Diagnosis not present

## 2017-05-29 MED ORDER — SODIUM CHLORIDE 0.9 % IV SOLN: 500.0000 mL | Freq: Once | INTRAVENOUS | Status: DC

## 2017-05-29 NOTE — Progress Notes (Signed)
Called to room to assist during endoscopic procedure.  Patient ID and intended procedure confirmed with present staff. Received instructions for my participation in the procedure from the performing physician.  

## 2017-05-29 NOTE — Patient Instructions (Addendum)
*  Handouts given on polyps and hemorrhoids. Resume Pletal tomorrow at prior dose.  Refer to managing physician for further adjustment of therapy. Continue other medications. Await pathology results.      YOU HAD AN ENDOSCOPIC PROCEDURE TODAY AT [facility]:   Refer to the procedure report that was given to you for any specific questions about what was found during the examination.  If the procedure report does not answer your questions, please call your gastroenterologist to clarify.  If you requested that your care partner not be given the details of your procedure findings, then the procedure report has been included in a sealed envelope for you to review at your convenience later.  YOU SHOULD EXPECT: Some feelings of bloating in the abdomen. Passage of more gas than usual.  Walking can help get rid of the air that was put into your GI tract during the procedure and reduce the bloating. If you had a lower endoscopy (such as a colonoscopy or flexible sigmoidoscopy) you may notice spotting of blood in your stool or on the toilet paper. If you underwent a bowel prep for your procedure, you may not have a normal bowel movement for a few days.  Please Note:  You might notice some irritation and congestion in your nose or some drainage.  This is from the oxygen used during your procedure.  There is no need for concern and it should clear up in a day or so.  SYMPTOMS TO REPORT IMMEDIATELY:   Following lower endoscopy (colonoscopy or flexible sigmoidoscopy):  Excessive amounts of blood in the stool  Significant tenderness or worsening of abdominal pains  Swelling of the abdomen that is new, acute  Fever of 100F or higher   For urgent or emergent issues, a gastroenterologist can be reached at any hour by calling [phone].   DIET:  We do recommend a small meal at first, but then you may proceed to your regular diet.  Drink plenty of fluids but you should avoid alcoholic  beverages for 24 hours.  ACTIVITY:  You should plan to take it easy for the rest of today and you should NOT DRIVE or use heavy machinery until tomorrow (because of the sedation medicines used during the test).    FOLLOW UP: Our staff will call the number listed on your records the next business day following your procedure to check on you and address any questions or concerns that you may have regarding the information given to you following your procedure. If we do not reach you, we will leave a message.  However, if you are feeling well and you are not experiencing any problems, there is no need to return our call.  We will assume that you have returned to your regular daily activities without incident.  If any biopsies were taken you will be contacted by phone or by letter within the next 1-3 weeks.  Please call us at [phone] if you have not heard about the biopsies in 3 weeks.    SIGNATURES/CONFIDENTIALITY: You and/or your care partner have signed paperwork which will be entered into your electronic medical record.  These signatures attest to the fact that that the information above on your After Visit Summary has been reviewed and is understood.  Full responsibility of the confidentiality of this discharge information lies with you and/or your care-partner.

## 2017-05-29 NOTE — Progress Notes (Signed)
Report to PACU, RN, vss, BBS= Clear.  

## 2017-05-29 NOTE — Op Note (Signed)
[facility] Patient Name: Mario Mcdowell Procedure Date: 05/29/2017 2:04 PM MRN: [id number] Endoscopist: Ladene Artist , MD Age: 67 Referring MD:  Date of Birth: 01/21/1950 Gender: Male Account #: [id number] Procedure:                Colonoscopy Indications:              High risk colon cancer surveillance: Personal                            history of colon cancer. Diarrhea. Medicines:                Monitored Anesthesia Care Procedure:                Pre-Anesthesia Assessment:                           - Prior to the procedure, a History and Physical                            was performed, and patient medications and                            allergies were reviewed. The patient's tolerance of                            previous anesthesia was also reviewed. The risks                            and benefits of the procedure and the sedation                            options and risks were discussed with the patient.                            All questions were answered, and informed consent                            was obtained. Prior Anticoagulants: The patient has                            taken Pletal (cilostazol), last dose was 6 days                            prior to procedure. ASA Grade Assessment: III - A                            patient with severe systemic disease. After                            reviewing the risks and benefits, the patient was                            deemed in satisfactory condition to undergo the  procedure.                           After obtaining informed consent, the colonoscope                            was passed under direct vision. Throughout the                            procedure, the patient's blood pressure, pulse, and                            oxygen saturations were monitored continuously. The                            Model PCF-H190DL [id number]) scope was introduced             through the anus and advanced to the the cecum,                            identified by appendiceal orifice and ileocecal                            valve. The ileocecal valve, appendiceal orifice,                            and rectum were photographed. The quality of the                            bowel preparation was adequate. The colonoscopy was                            performed without difficulty. The patient tolerated                            the procedure well. Scope In: 2:13:36 PM Scope Out: 2:28:41 PM Scope Withdrawal Time: 0 hours 12 minutes 55 seconds  Total Procedure Duration: 0 hours 15 minutes 5 seconds  Findings:                 The perianal and digital rectal examinations were                            normal.                           A 7 mm polyp was found in the descending colon. The                            polyp was sessile. The polyp was removed with a                            cold snare. Resection and retrieval were complete.                           A  4 mm polyp was found in the transverse colon. The                            polyp was sessile. The polyp was removed with a                            cold biopsy forceps. Resection and retrieval were                            complete.                           There was evidence of a prior end-to-end                            colo-colonic anastomosis in the distal sigmoid                            colon. This was patent and was characterized by                            healthy appearing mucosa. The anastomosis was                            traversed.                           Internal hemorrhoids were found during                            retroflexion. The hemorrhoids were small and Grade                            I (internal hemorrhoids that do not prolapse).                           The exam was otherwise without abnormality on                            direct and retroflexion  views. Random biopsies                            taken throughout. Complications:            No immediate complications. Estimated blood loss:                            None. Estimated Blood Loss:     Estimated blood loss: none. Impression:               - One 7 mm polyp in the descending colon, removed                            with a cold snare. Resected and retrieved.                           -  One 4 mm polyp in the transverse colon, removed                            with a cold biopsy forceps. Resected and retrieved.                           - Patent end-to-end colo-colonic anastomosis,                            characterized by healthy appearing mucosa.                           - Internal hemorrhoids.                           - The examination was otherwise normal on direct                            and retroflexion views. Recommendation:           - Repeat colonoscopy in 2 years for surveillance.                           - Resume Pletal (cilostazol) tomorrow at prior                            dose. Refer to managing physician for further                            adjustment of therapy.                           - Patient has a contact number available for                            emergencies. The signs and symptoms of potential                            delayed complications were discussed with the                            patient. Return to normal activities tomorrow.                            Written discharge instructions were provided to the                            patient.                           - Resume previous diet.                           - Continue present medications.                           - Await  pathology results. Ladene Artist, MD 05/29/2017 2:34:18 PM This report has been signed electronically.

## 2017-05-30 ENCOUNTER — Telehealth: Payer: Self-pay

## 2017-05-30 NOTE — Telephone Encounter (Signed)
Left message

## 2017-05-30 NOTE — Telephone Encounter (Signed)
  Follow up Call-  Call Deija Buhrman number 05/29/2017 03/12/2016  Post procedure Call Mickey Esguerra phone  # [phone]  Permission to leave phone message Yes Yes  Some recent data might be hidden     Patient questions:  Do you have a fever, pain , or abdominal swelling? No. Pain Score  0 *  Have you tolerated food without any problems? Yes.    Have you been able to return to your normal activities? Yes.    Do you have any questions about your discharge instructions: Diet   No. Medications  No. Follow up visit  No.  Do you have questions or concerns about your Care? No.  Actions: * If pain score is 4 or above: No action needed, pain <4.

## 2017-06-02 ENCOUNTER — Other Ambulatory Visit: Payer: Self-pay | Admitting: Hematology

## 2017-06-02 DIAGNOSIS — C772 Secondary and unspecified malignant neoplasm of intra-abdominal lymph nodes: Principal | ICD-10-CM

## 2017-06-02 DIAGNOSIS — C187 Malignant neoplasm of sigmoid colon: Secondary | ICD-10-CM

## 2017-06-03 ENCOUNTER — Telehealth: Payer: Self-pay | Admitting: Hematology

## 2017-06-03 ENCOUNTER — Inpatient Hospital Stay: Payer: Medicare Other | Attending: Hematology

## 2017-06-03 ENCOUNTER — Encounter: Payer: Self-pay | Admitting: Hematology

## 2017-06-03 ENCOUNTER — Inpatient Hospital Stay (HOSPITAL_BASED_OUTPATIENT_CLINIC_OR_DEPARTMENT_OTHER): Payer: Medicare Other | Admitting: Hematology

## 2017-06-03 VITALS — BP 121/72 | HR 75 | Temp 97.9°F | Resp 18 | Ht 63.75 in | Wt 120.1 lb

## 2017-06-03 DIAGNOSIS — J432 Centrilobular emphysema: Secondary | ICD-10-CM | POA: Insufficient documentation

## 2017-06-03 DIAGNOSIS — I2510 Atherosclerotic heart disease of native coronary artery without angina pectoris: Secondary | ICD-10-CM

## 2017-06-03 DIAGNOSIS — C187 Malignant neoplasm of sigmoid colon: Secondary | ICD-10-CM

## 2017-06-03 DIAGNOSIS — Z85038 Personal history of other malignant neoplasm of large intestine: Secondary | ICD-10-CM | POA: Insufficient documentation

## 2017-06-03 DIAGNOSIS — I700 Atherosclerosis of aorta: Secondary | ICD-10-CM | POA: Diagnosis not present

## 2017-06-03 DIAGNOSIS — I739 Peripheral vascular disease, unspecified: Secondary | ICD-10-CM | POA: Diagnosis not present

## 2017-06-03 DIAGNOSIS — N400 Enlarged prostate without lower urinary tract symptoms: Secondary | ICD-10-CM | POA: Diagnosis not present

## 2017-06-03 DIAGNOSIS — F17210 Nicotine dependence, cigarettes, uncomplicated: Secondary | ICD-10-CM | POA: Insufficient documentation

## 2017-06-03 DIAGNOSIS — Z87442 Personal history of urinary calculi: Secondary | ICD-10-CM | POA: Insufficient documentation

## 2017-06-03 DIAGNOSIS — Z9221 Personal history of antineoplastic chemotherapy: Secondary | ICD-10-CM

## 2017-06-03 DIAGNOSIS — C772 Secondary and unspecified malignant neoplasm of intra-abdominal lymph nodes: Secondary | ICD-10-CM

## 2017-06-03 LAB — COMPREHENSIVE METABOLIC PANEL
ALBUMIN: 3.7 g/dL (ref 3.5–5.0)
ALK PHOS: 91 U/L (ref 40–150)
ALT: 21 U/L (ref 0–55)
AST: 20 U/L (ref 5–34)
Anion gap: 7 (ref 3–11)
BUN: 16 mg/dL (ref 7–26)
CALCIUM: 8.9 mg/dL (ref 8.4–10.4)
CO2: 25 mmol/L (ref 22–29)
Chloride: 104 mmol/L (ref 98–109)
Creatinine, Ser: 0.88 mg/dL (ref 0.70–1.30)
GFR calc Af Amer: >60 mL/min (ref >60)
GFR calc non Af Amer: >60 mL/min (ref >60)
GLUCOSE: 80 mg/dL (ref 70–140)
Potassium: 3.9 mmol/L (ref 3.5–5.1)
Sodium: 136 mmol/L (ref 136–145)
TOTAL PROTEIN: 6.8 g/dL (ref 6.4–8.3)

## 2017-06-03 LAB — CBC WITH DIFFERENTIAL/PLATELET
BASOS ABS: 0.1 10*3/uL (ref 0.0–0.1)
BASOS PCT: 1 %
Eosinophils Absolute: 0.3 10*3/uL (ref 0.0–0.5)
Eosinophils Relative: 3 %
HEMATOCRIT: 40.8 % (ref 38.4–49.9)
HEMOGLOBIN: 14 g/dL (ref 13.0–17.1)
Lymphocytes Relative: 40 %
Lymphs Abs: 3.8 10*3/uL — ABNORMAL HIGH (ref 0.9–3.3)
MCH: 32 pg (ref 27.2–33.4)
MCHC: 34.3 g/dL (ref 32.0–36.0)
MCV: 93.2 fL (ref 79.3–98.0)
Monocytes Absolute: 0.6 10*3/uL (ref 0.1–0.9)
Monocytes Relative: 7 %
NEUTROS ABS: 4.6 10*3/uL (ref 1.5–6.5)
NEUTROS PCT: 49 %
Platelets: 172 10*3/uL (ref 140–400)
RBC: 4.38 MIL/uL (ref 4.20–5.82)
RDW: 14.7 % — ABNORMAL HIGH (ref 11.0–14.6)
WBC: 9.3 10*3/uL (ref 4.0–10.3)

## 2017-06-03 LAB — CEA (IN HOUSE-CHCC): CEA (CHCC-IN HOUSE): 3.99 ng/mL (ref 0.00–5.00)

## 2017-06-03 NOTE — Telephone Encounter (Signed)
Appointments scheduled AVS/Calendar contrast material provided per 5/20 los

## 2017-06-03 NOTE — Progress Notes (Signed)
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 Resected and retrieved. - Rule out malignancy, tumor in the sigmoid colon. Biopsied. Tattooed. - Internal hemorrhoids. - The examination was otherwise normal on direct and retroflexion views.       03/14/2016 Imaging    CT A/P W CONTRAST  IMPRESSION: Short segment area of wall thickening in the distal sigmoid colon, suspicious for site of primary colon carcinoma. Recommend correlation with colonoscopy results.      04/23/2016 Imaging    CT Chest W Contrast IMPRESSION: 1. No evidence of pulmonary metastasis. 2. Bilateral calcified pulmonary nodules and mediastinal lymph nodes consistent with granulomatous disease. 3. Centrilobular emphysema. 4. Coronary artery calcification and aortic atherosclerotic calcification.      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 Colon, segmental resection for tumor, sigmoid - INVASIVE ADENOCARCINOMA, WELL TO MODERATELY DIFFERENTIATED, WITH  ABUNDANT MUCIN (PT3 PN1) - METASTATIC ADENOCARCINOMA IN ONE OF TWENTY-TWO LYMPH NODES (1/22) - TWO TUMOR DEPOSITS PRESENT - MARGINS UNINVOLVED BY CARCINOMA - HYPERPLASTIC POLYPS (X2) - SEE ONCOLOGY TABLE BELOW 2. Colon, resection margin (donut), final distal margin - BENIGN COLON - NO MALIGNANCY IDENTIFIED      05/21/2016 Surgery    LAPAROSCOPIC PARTIAL COLECTOMY, SPLENIC FLEXURE MOBILIZATION AND PROCTOSCOPY      06/25/2016 - 09/03/2016 Chemotherapy    FOLFOX every 2 weeks for 3 months       12/03/2016 Imaging    CT AP 12/03/16 IMPRESSION: Status post sigmoid resection. No evidence of recurrent or metastatic disease.       HISTORY OF PRESENTING ILLNESS:  Mithran Strike 67 y.o. male is here because of recently diagnosed colorectal cancer. He has reported to Dr. [name] on 01/21/92 for a evaluation of a rectosigmoid colon mass. 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 He denied any change in his bowel movement or habits of rectal bleeding.   He had a screening colonoscopy by Dr. Fuller Plan 03/12/2016. Who referred him to [name]. He denies any symptoms before. Before surgery, no changes in weight loss or change in energy level  Since the surgery, his appetite has been good and his energy level is fine.No pain reported from the incision. He takes hydrocodone for his pain.  August 08, 2015 he was admitted to the hospital for right lower quadrant abdominal pain due to a kidney stone.  He presents to the clinic today with his wife for consult of his stage 3 colon cancer and chemotherapy. The patient's wife says he doesn't go to the doctor  often. He is complaint and wants to do whatever he can to get treated. He is worried about what he can eat when he can start chemo.  CURRENT THERAPY: Surveillance   INTERVAL HISTORY:   Mario Mcdowell returns for follow up for his colon cancer. Since his last visit he had a colonoscopy on 05/29/17 which should internal hemorrhoids and  2 small polyp that were removed. His pathology is still pending. He presents to the clinic today by himself. He notes he has gotten sun burnt recently so he wears a hat outside now. He sees Dr. Danise Mina yearly.     On review of symptoms, pt notes good appetite and denies abdominal pain. He notes his stool will be loose when he is on his vein medication, Pletal. He has a blockage abdominal arteries which causes lower extremity numbness which only occurs when he walks for a distance.  He denies chest problems.      MEDICAL HISTORY:  Past Medical History:  Diagnosis Date  . Aorto-iliac atherosclerosis ([place])    by CT; in bilateral legs   . Arthritis   . Atherosclerotic PVD with intermittent claudication ([place])   . BPH without obstruction/lower urinary tract symptoms 10/25/2015  . Centrilobular emphysema ([place])    per chest CT 04-23-2016  . Colon adenocarcinoma [facility]) oncologist- dr Burr Medico   dx 05-21-2016 -- Stage IIIB (pT3,pN1,M0) , Invasive sigmoid adenocarcinoma well to moderately differentiated with metastatic intra-abdominal lymph node (1 out of 22), s/p lap. partial colectomy [name]) 05/21/2016 and chemotherapy is planned  . Full dentures   . History of kidney stones 07/2015   passed without intervention  . Pulmonary nodules    per chest CT 04-23-2016 bilateral nodules (consistent w/ granulomatous disease)  . Smokers' cough ([place])   . Wears contact lenses     SURGICAL HISTORY: Past Surgical History:  Procedure Laterality Date  . COLONOSCOPY  03/12/2016   4 polyps, sigmoid tumor biopsied (TVA w/ MICROSCOPIC FOCUS SUSPICIOUS FOR INVASION) pending surgical resection, rpt 1 yr Fuller Plan)  . LAPAROSCOPIC PARTIAL COLECTOMY N/A 05/21/2016   colon cancer, 1/20 positive lymp nodes. LAPAROSCOPIC PARTIAL COLECTOMY, SPLENIC FLEXURE MOBILIZATION AND PROCTOSCOPY;  [name], MD)  . MULTIPLE TOOTH EXTRACTIONS  1980's  . PORT-A-CATH REMOVAL Right 10/04/2016   Procedure: REMOVAL PORT-A-CATH;   Surgeon: [name], MD;  [facility];  Service: General;  Laterality: Right;  . PORTACATH PLACEMENT N/A 06/21/2016   Procedure: INSERTION PORT-A-CATH;  Surgeon: [name], MD;  Location: [facility];  Service: General;  Laterality: N/A;  right    SOCIAL HISTORY: Social History   Socioeconomic History  . Marital status: Married    Spouse name: Not on file  . Number of children: Not on file  . Years of education: Not on file  . Highest education level: Not on file  Occupational History  . Not on file  Social Needs  . Financial resource strain: Not on file  . Food insecurity:    Worry: Not on file    Inability: Not on file  . Transportation needs:    Medical: Not on file    Non-medical: Not on file  Tobacco Use  . Smoking status: Current Every Day Smoker    Packs/day: 1.00    Years: 46.00    Pack years: 46.00    Types: Cigarettes  . Smokeless tobacco: Never Used  . Tobacco comment: trying to quit -reduced from 1.5ppd  Substance and Sexual Activity  . Alcohol use:  No    Comment: quit 2000  . Drug use: No  . Sexual activity: Never    Birth control/protection: Abstinence  Lifestyle  . Physical activity:    Days per week: Not on file    Minutes per session: Not on file  . Stress: Not on file  Relationships  . Social connections:    Talks on phone: Not on file    Gets together: Not on file    Attends religious service: Not on file    Active member of club or organization: Not on file    Attends meetings of clubs or organizations: Not on file    Relationship status: Not on file  . Intimate partner violence:    Fear of current or ex partner: Not on file    Emotionally abused: Not on file    Physically abused: Not on file    Forced sexual activity: Not on file  Other Topics Concern  . Not on file  Social History Narrative   Lives with wife, adopted daughter, adopted son   Occ: Games developer   Edu: 8th grade     FAMILY  HISTORY: Family History  Problem Relation Age of Onset  . Cancer Paternal Aunt        unsure details  . Diabetes Cousin   . Cancer Paternal Uncle        unknow type cancer   . Prostate cancer Neg Hx   . Kidney cancer Neg Hx   . CAD Neg Hx   . Stroke Neg Hx   . Colon cancer Neg Hx     ALLERGIES:  is allergic to penicillins and lipitor [atorvastatin].  MEDICATIONS:  Current Outpatient Medications  Medication Sig Dispense Refill  . aspirin EC 81 MG tablet Take 81 mg by mouth daily.    . cilostazol (PLETAL) 100 MG tablet Take 1 tablet (100 mg total) by mouth 2 (two) times daily before a meal. 60 tablet 11  . diclofenac (VOLTAREN) 75 MG EC tablet Take 1 tablet (75 mg total) by mouth 2 (two) times daily. 30 tablet 0  . lovastatin (MEVACOR) 40 MG tablet Take 1 tablet (40 mg total) by mouth at bedtime. 30 tablet 3   Current Facility-Administered Medications  Medication Dose Route Frequency Provider Last Rate Last Dose  . 0.9 %  sodium chloride infusion  500 mL Intravenous Once Ladene Artist, MD        REVIEW OF SYSTEMS:  Constitutional: Denies fevers, chills or abnormal night sweats  Eyes: Denies blurriness of vision, double vision or watery eyes Ears, nose, mouth, throat, and face: Denies mucositis or sore throat Respiratory: Denies cough, dyspnea or wheezes Cardiovascular: Denies palpitation, chest discomfort or lower extremity swelling  Gastrointestinal:  Denies heartburn or change in bowel habits  Skin: Denies abnormal skin rashes (+) sun burn  Lymphatics: Denies new lymphadenopathy or easy bruising  Neurological:Denies numbness, tingling or new weaknesses (+) numbness in lower extremities from walking long distance Behavioral/Psych: Mood is stable, no new changes  All other systems were reviewed with the patient and are negative.  PHYSICAL EXAMINATION:  ECOG PERFORMANCE STATUS: 0  Vitals:   06/03/17 1255  BP: 121/72  Pulse: 75  Resp: 18  Temp: 97.9 F (36.6 C)    SpO2: 97%   Filed Weights   06/03/17 1255  Weight: 120 lb 1.6 oz (54.5 kg)    GENERAL:alert, no distress and comfortable. Seen in infusion area, presents in treatment chair. SKIN: skin color, texture, turgor  are normal, no rashes or significant lesions EYES: normal, conjunctiva are pink and non-injected, sclera clear OROPHARYNX:no exudate, no erythema and lips, buccal mucosa, and tongue normal  NECK: supple, thyroid normal size, non-tender, without nodularity LYMPH:  no palpable lymphadenopathy in the cervical, axillary or inguinal LUNGS: clear to auscultation and percussion with normal breathing effort HEART: regular rate & rhythm and no murmurs and no lower extremity edema ABDOMEN:abdomen soft, non-tender and normal bowel sounds, surgical incisions are well healed  Musculoskeletal:no cyanosis of digits and no clubbing  PSYCH: alert & oriented x 3 with fluent speech NEURO: no focal motor/sensory deficits  LABORATORY DATA:  I have reviewed the data as listed CBC Latest Ref Rng & Units 06/03/2017 03/07/2017 12/03/2016  WBC 4.0 - 10.3 K/uL 9.3 7.4 7.0  Hemoglobin 13.0 - 17.1 g/dL 14.0 14.2 14.9  Hematocrit 38.4 - 49.9 % 40.8 41.3 44.3  Platelets 140 - 400 K/uL 172 176 178   CMP Latest Ref Rng & Units 06/03/2017 03/07/2017 12/03/2016  Glucose 70 - 140 mg/dL 80 161(H) 108  BUN 7 - 26 mg/dL 16 14 14.4  Creatinine 0.70 - 1.30 mg/dL 0.88 1.10 0.8  Sodium 136 - 145 mmol/L 136 139 139  Potassium 3.5 - 5.1 mmol/L 3.9 4.0 4.4  Chloride 98 - 109 mmol/L 104 105 -  CO2 22 - 29 mmol/L _0 Calcium 8.4 - 10.4 mg/dL 8.9 9.2 9.5  Total Protein 6.4 - 8.3 g/dL 6.8 6.6 7.1  Total Bilirubin 0.2 - 1.2 mg/dL <0.2(L) <0.2(L) <0.22  Alkaline Phos 40 - 150 U/L 91 86 85  AST 5 - 34 U/L _1 ALT 0 - 55 U/L _2 Pathology report  Diagnosis 05/21/16 1. Colon, segmental resection for tumor, sigmoid - INVASIVE ADENOCARCINOMA, WELL TO MODERATELY DIFFERENTIATED, WITH ABUNDANT MUCIN (PT3  PN1) - METASTATIC ADENOCARCINOMA IN ONE OF TWENTY-TWO LYMPH NODES (1/22) - TWO TUMOR DEPOSITS PRESENT - MARGINS UNINVOLVED BY CARCINOMA - HYPERPLASTIC POLYPS (X2) - SEE ONCOLOGY TABLE BELOW 2. Colon, resection margin (donut), final distal margin - BENIGN COLON - NO MALIGNANCY IDENTIFIED Microscopic Comment 1. ONCOLOGY TABLE: COLON Specimen: Sigmoid colon Procedure: Sigmoidectomy Tumor site: Sigmoid Macroscopic tumor perforation: Not identified Invasive tumor: 3.4 x 2.5 x 0.6 cm Histologic type(s): Adenocarcinoma with abundant mucin Histologic grade and differentiation: G1- G2 Microscopic extension of invasive tumor: Tumor invades muscularis propria Lymph-Vascular invasion: Not identified Perineural invasion: Not identified Treatment Effect: No known presurgical therapy Tumor deposit(s): Present, 2 Resection margins: All margins are uninvolved by invasive carcinoma, high-grade dysplasia, intramucosal adenocarcinoma, and adenoma Margins examined: Proximal, distal and Radial Distance closest margin (if all above margins negative): 3.6 cm (distal) Additional polyp(s): Hyperplastic polyps (x2) Lymph nodes: number examined 22; number positive: 1 1 of 5 Supplemental copy SUPPLEMENTAL for ARTICE, BERGERSON [phone]) Microscopic Comment(continued) Pathologic Staging: pT3, pN1 Ancillary studies: performed on block 1G (MMR by immunohistochemistry and MSI by PCR; will be reported separately.) Dr. Lyndon Code has reviewed the pertinent slide(1E) and agrees that adenocarcinoma is present in this sample.    PROCEDURES  Colonoscopy on 05/29/17 by Dr. Fuller Plan  IMPRESSION: - One 7 mm polyp in the descending colon, removed with a cold snare. Resected and retrieved. - One 4 mm polyp in the transverse colon, removed with a cold biopsy forceps. Resected and retrieved. - Patent end-to-end colo-colonic anastomosis, characterized by healthy appearing mucosa. - Internal hemorrhoids. - The examination was  otherwise normal on direct and retroflexion views.   RADIOGRAPHIC  STUDIES: I have personally reviewed the radiological images as listed and agreed with the findings in the report. No results found.   CT CHEST W CONTRAST 04/23/16 IMPRESSION: 1. No evidence of pulmonary metastasis. 2. Bilateral calcified pulmonary nodules and mediastinal lymph nodes consistent with granulomatous disease. 3. Centrilobular emphysema. 4. Coronary artery calcification and aortic atherosclerotic calcification.  CT A/P W CONTRAST 03/14/16 IMPRESSION: Short segment area of wall thickening in the distal sigmoid colon, suspicious for site of primary colon carcinoma. Recommend correlation with colonoscopy results.  No evidence of local or distant metastatic disease. No evidence of bowel obstruction.  PROCEDURES Colonoscopy 03/12/2016 Four 6 to 8 mm polyps in the rectum and in the transverse colon, removed with a cold snare. Resected and retrieved. - Rule out malignancy, tumor in the sigmoid colon. Biopsied. Tattooed. - Internal hemorrhoids. - The examination was otherwise normal on direct and retroflexion views.   ASSESSMENT & PLAN:  67 y.o.  male  1. 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 -We previously discussed the risk of cancer recurrence after surgery. Due to his stage IIIB disease, he is at high risk for recurrence -We previously discussed the standard adjuvant chemotherapy for stage III colon cancer,  reduce the risk of cancer recurrence. -We previously discussed the regimens for adjuvant chemotherapy, including FOLFOX, CAPOX, or single agent Xeloda or 5-FU. He is 51, but does not have many medical comorbidities and recovered from surgery quickly. I recommended him to consider  FOLFOX -We previously discussed the duration of adjuvant chemotherapy. Based on the recent reported IDEA study in ASCO 2017, I recommended 3 months therapy.  -He underwent adjuvant chemotherapy 06/25/16-09/03/16, tolerated well overall, mild nausea, no neuropathy. -Port was removed on 10/04/16 by Dr. [name]  -I reviewed his surveillance CT AP from 12/03/16 which showed no evidence of recurrence.  -Pt is clinically doing well, except notes abdominal artery blockage has lead to lower extremity numbness when walking long distance. He is currently on Pletal for this managed by Dr. Scot Dock. I encouraged him to quit smoking as this smoking can worsen his condition.  -He had a coloscopy by Dr. Fuller Plan on 05/29/17, he had 2 polyps found and removed, pathology is still pending. He also was seen to have internal hemorrhoids.  -Labs reviewed, Glucose is normal, Lymphocytes at 3.8 otherwise CBC and CMP WNL.  -plan to repeat CT CAP w contrast in  08/2017 -F/u in 08/2017    2. Smoking cessation  -He is still smoking 1 pack or less a day. He has been trying to quit, but unsuccessful.  -I again strongly encouraged him to work towards quitting. I recommended Nicotine patches or counseling to help him quit. He declined at this time.  -I discussed smoking causes blockage of his blood vessels and stroke.  -He notes he has thought of smoking but has not tried any medication or patches to help. I suggest he tries Chantax managed by his PCP to help him.   3. Emphysema  4. BPH -01/09/17 PSA normal at 0.55 -Will continue to be followed by his PCP.   5. History of kidney stones   PLAN -F/u in 3 months -Lab and CT a few days before     Orders Placed This Encounter  Procedures  . CT Abdomen Pelvis W Contrast  Standing Status:   Future    Standing Expiration Date:   06/03/2018    Order Specific Question:   If indicated for the ordered procedure, I authorize the administration of contrast media per Radiology  protocol    Answer:   Yes    Order Specific Question:   Preferred imaging location?    Answer:   [facility]    Order Specific Question:   Is Oral Contrast requested for this exam?    Answer:   Yes, Per Radiology protocol    Order Specific Question:   Radiology Contrast Protocol - do NOT remove file path    Answer:   \\[email or web address]  . CT Chest W Contrast    Standing Status:   Future    Standing Expiration Date:   06/03/2018    Order Specific Question:   If indicated for the ordered procedure, I authorize the administration of contrast media per Radiology protocol    Answer:   Yes    Order Specific Question:   Preferred imaging location?    Answer:   [facility]    Order Specific Question:   Radiology Contrast Protocol - do NOT remove file path    Answer:   \\[email or web address]    All questions were answered. The patient knows to call the clinic with any problems, questions or concerns.  I spent 15 minutes counseling the patient face to face. The total time spent in the appointment was 20 minutes and more than 50% was on counseling.    Truitt Merle, MD 06/03/2017   This document serves as a record of services personally performed by Truitt Merle, MD. It was created on her behalf by Joslyn Devon, a trained medical scribe. The creation of this record is based on the scribe's personal observations and the provider's statements to them.    I have reviewed the above documentation for accuracy and completeness, and I agree with the above.

## 2017-06-09 ENCOUNTER — Encounter: Payer: Self-pay | Admitting: Gastroenterology

## 2017-06-10 ENCOUNTER — Encounter: Payer: Self-pay | Admitting: Family Medicine

## 2017-06-25 ENCOUNTER — Other Ambulatory Visit: Payer: Self-pay

## 2017-06-25 MED ORDER — LOVASTATIN 40 MG PO TABS: 40.0000 mg | ORAL_TABLET | Freq: Every day | ORAL | 6 refills | Status: DC

## 2017-06-25 NOTE — Telephone Encounter (Signed)
Sent refill

## 2017-07-10 ENCOUNTER — Ambulatory Visit (INDEPENDENT_AMBULATORY_CARE_PROVIDER_SITE_OTHER): Payer: Medicare Other | Admitting: Vascular Surgery

## 2017-07-10 ENCOUNTER — Ambulatory Visit (HOSPITAL_COMMUNITY)
Admission: RE | Admit: 2017-07-10 | Discharge: 2017-07-10 | Disposition: A | Discharge status: Home / Self Care | Payer: Medicare Other | Source: Ambulatory Visit | Attending: Vascular Surgery | Admitting: Vascular Surgery

## 2017-07-10 ENCOUNTER — Other Ambulatory Visit: Payer: Self-pay

## 2017-07-10 ENCOUNTER — Encounter: Payer: Self-pay | Admitting: Vascular Surgery

## 2017-07-10 VITALS — BP 117/77 | HR 76 | Temp 97.2°F | Resp 16 | Ht 65.0 in | Wt 117.0 lb

## 2017-07-10 DIAGNOSIS — I739 Peripheral vascular disease, unspecified: Secondary | ICD-10-CM

## 2017-07-10 DIAGNOSIS — I6522 Occlusion and stenosis of left carotid artery: Secondary | ICD-10-CM

## 2017-07-10 NOTE — Progress Notes (Signed)
Vitals:   07/10/17 1540  BP: (!) 143/84  Pulse: 72  Resp: 16  Temp: (!) 97.2 F (36.2 C)  TempSrc: Oral  SpO2: 98%  Weight: 117 lb (53.1 kg)  Height: 5\' 5"  (1.651 m)

## 2017-07-10 NOTE — Progress Notes (Signed)
Patient name: Mario Mcdowell MRN: [id number] DOB: August 07, 1950 Sex: male  REASON FOR VISIT:   Follow-up of peripheral vascular disease and carotid disease.  HPI:   Mario Mcdowell is a pleasant 67 y.o. male who I last saw 6 months ago on 01/02/2017.  He was seen in consultation with bilateral lower extremity pain.  He had been referred by Dr. Joni Fears.  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  He also has superficial femoral artery occlusions bilaterally.  At the time of his last visit his ABI was 57% on the right and 46% on the left.  I recommended conservative treatment.  We discussed the importance of tobacco cessation.  I encouraged him to get on a structured walking program.  Of note, I felt that if his symptoms worsened he may not be a candidate for aortofemoral bypass grafting given his severe calcific disease of the aorta.  He would more likely require an axillobifemoral bypass.  I also heard carotid bruits at the time of that visit and therefore I ordered a carotid duplex scan which was done on 01/10/2017.  This showed no significant stenosis on the right.  There was a 40 to 59% left carotid stenosis.  Since I saw him last, he complains of bilateral hip thigh and calf claudication.  His symptoms are brought on by ambulation and relieved with rest.  They occur at approximately 30 yards.  His symptoms have been gradually progressive.  There are no other aggravating or alleviating factors.  His symptoms are equal on both sides.  He denies any history of rest pain or nonhealing ulcers.  His symptoms do not occur at rest.  He has no history of back pain.  He continues to smoke over a pack per day.  He is on aspirin and is on a statin.   Past Medical History:  Diagnosis Date  . Aorto-iliac atherosclerosis ([place])    by CT; in bilateral legs   . Arthritis   . Atherosclerotic PVD with intermittent claudication  ([place])   . BPH without obstruction/lower urinary tract symptoms 10/25/2015  . Centrilobular emphysema ([place])    per chest CT 04-23-2016  . Colon adenocarcinoma [facility]) oncologist- dr Burr Medico   dx 05-21-2016 -- Stage IIIB (pT3,pN1,M0) , Invasive sigmoid adenocarcinoma well to moderately differentiated with metastatic intra-abdominal lymph node (1 out of 22), s/p lap. partial colectomy [name]) 05/21/2016 and chemotherapy is planned  . Full dentures   . History of kidney stones 07/2015   passed without intervention  . Pulmonary nodules    per chest CT 04-23-2016 bilateral nodules (consistent w/ granulomatous disease)  . Smokers' cough ([place])   . Wears contact lenses     Family History  Problem Relation Age of Onset  . Cancer Paternal Aunt        unsure details  . Diabetes Cousin   . Cancer Paternal Uncle        unknow type cancer   . Prostate cancer Neg Hx   . Kidney cancer Neg Hx   . CAD Neg Hx   . Stroke Neg Hx   . Colon cancer Neg Hx     SOCIAL HISTORY: He smokes over a pack per day of cigarettes. Social History   Tobacco Use  . Smoking status: Current Every Day Smoker    Packs/day: 1.00    Years: 46.00    Pack years: 46.00    Types:  Cigarettes  . Smokeless tobacco: Never Used  . Tobacco comment: trying to quit -reduced from 1.5ppd  Substance Use Topics  . Alcohol use: No    Comment: quit 2000    Allergies  Allergen Reactions  . Penicillins Swelling    Has patient had a PCN reaction causing immediate rash, facial/tongue/throat swelling, SOB or lightheadedness with hypotension: Yes Has patient had a PCN reaction causing severe rash involving mucus membranes or skin necrosis: No Has patient had a PCN reaction that required hospitalization No Has patient had a PCN reaction occurring within the last 10 years: No If all of the above answers are "NO", then may proceed with Cephalosporin use.   . Lipitor [Atorvastatin] Nausea Only    Current Outpatient Medications    Medication Sig Dispense Refill  . aspirin EC 81 MG tablet Take 81 mg by mouth daily.    . cilostazol (PLETAL) 100 MG tablet Take 1 tablet (100 mg total) by mouth 2 (two) times daily before a meal. 60 tablet 11  . diclofenac (VOLTAREN) 75 MG EC tablet Take 1 tablet (75 mg total) by mouth 2 (two) times daily. 30 tablet 0  . lovastatin (MEVACOR) 40 MG tablet Take 1 tablet (40 mg total) by mouth at bedtime. 30 tablet 6   Current Facility-Administered Medications  Medication Dose Route Frequency Provider Last Rate Last Dose  . 0.9 %  sodium chloride infusion  500 mL Intravenous Once Ladene Artist, MD        REVIEW OF SYSTEMS:  [X]  denotes positive finding, [ ]  denotes negative finding Cardiac  Comments:  Chest pain or chest pressure:    Shortness of breath upon exertion:    Short of breath when lying flat:    Irregular heart rhythm:        Vascular    Pain in calf, thigh, or hip brought on by ambulation: x   Pain in feet at night that wakes you up from your sleep:     Blood clot in your veins:    Leg swelling:         Pulmonary    Oxygen at home:    Productive cough:     Wheezing:         Neurologic    Sudden weakness in arms or legs:     Sudden numbness in arms or legs:     Sudden onset of difficulty speaking or slurred speech:    Temporary loss of vision in one eye:     Problems with dizziness:         Gastrointestinal    Blood in stool:     Vomited blood:         Genitourinary    Burning when urinating:     Blood in urine:        Psychiatric    Major depression:         Hematologic    Bleeding problems:    Problems with blood clotting too easily:        Skin    Rashes or ulcers:        Constitutional    Fever or chills:     PHYSICAL EXAM:   Vitals:   07/10/17 1540 07/10/17 1545  BP: (!) 143/84 117/77  Pulse: 72 76  Resp: 16   Temp: (!) 97.2 F (36.2 C)   TempSrc: Oral   SpO2: 98%   Weight: 117 lb (53.1 kg)   Height: 5\' 5"  (1.651 m)  GENERAL: The patient is a well-nourished male, in no acute distress. The vital signs are documented above. CARDIAC: There is a regular rate and rhythm.  VASCULAR: He has soft bilateral carotid bruits. I cannot palpate femoral, popliteal, or pedal pulses on either side. He has no significant lower extremity swelling. PULMONARY: There is good air exchange bilaterally without wheezing or rales. ABDOMEN: Soft and non-tender with normal pitched bowel sounds.  MUSCULOSKELETAL: There are no major deformities or cyanosis. NEUROLOGIC: No focal weakness or paresthesias are detected. SKIN: There are no ulcers or rashes noted. PSYCHIATRIC: The patient has a normal affect.  DATA:    ARTERIAL DOPPLER STUDY: I have independently interpreted his arterial Doppler study today.  On the right side he has a monophasic dorsalis pedis and posterior tibial signal.  ABIs 57%.  This is unchanged compared to the study 6 months ago.  Toe pressure on the right is 50 mmHg.  On the left side, he has a monophasic dorsalis pedis and posterior tibial signal.  ABI is 60%.  This is improved compared to 46% 6 months ago.  Toe pressure on the left is 53 mmHg.    MEDICAL ISSUES:   PERIPHERAL VASCULAR DISEASE: This patient has an aortic occlusion with reconstitution of the common femoral arteries bilaterally.  He has significant calcific disease and I do not think he is a good candidate for aortofemoral bypass grafting.  He feels that his symptoms are quite disabling.  We have discussed conservative treatment including the importance of tobacco cessation and walking as much as possible.  He is not sure that this is helping and wants to consider surgery.  I have explained that I think the best option, given his severe calcific disease of the infrarenal aorta would be axillofemoral bypass grafting.  I reviewed his previous carotid duplex scan that was done in December 2018.  This showed bidirectional flow in the left vertebral  artery.  The arm pressure on the left was 90 compared to 120 on the right.  He had a monophasic signals in the left arm.  He has an easily palpable brachial and radial pulse in the left, however given these findings I think he would need formal upper extremity arterial Doppler assessment prior to axillofemoral bypass grafting.  He would likely require a RIGHT axillofemoral bypass.  In addition he would need a formal CT angiogram of the abdomen with runoff.  His previous study was in November 2018 and was only assessing the abdomen and pelvis.  Finally if we were to consider axillobifemoral bypass grafting he would need preoperative cardiac clearance.  I have discussed the potential complications of surgery including but not limited to graft thrombosis (five-year patency 70%), graft infection, wound healing problems and bleeding.  He would like to discuss this with his wife and if he decides to consider surgery and he will need preoperative cardiac clearance, a formal upper extremity arterial Doppler assessment, and a CT angiogram of the aorta with runoff for preoperative planning.  In addition we have again discussed the importance of tobacco cessation.  ASYMPTOMATIC 40-59% LEFT CAROTID STENOSIS: He has an asymptomatic 40 to 59% left carotid stenosis.  He is due for a follow-up duplex scan in December 2019 and I have ordered that study.  He is on aspirin and is on a statin.  We have again discussed the importance of tobacco cessation.  Deitra Mayo [facility] [phone]

## 2017-08-27 NOTE — Progress Notes (Signed)
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 Resected and retrieved. - Rule out malignancy, tumor in the sigmoid colon. Biopsied. Tattooed. - Internal hemorrhoids. - The examination was otherwise normal on direct and retroflexion views.     03/14/2016 Imaging    CT A/P W CONTRAST  IMPRESSION: Short segment area of wall thickening in the distal sigmoid colon, suspicious for site of primary colon carcinoma. Recommend correlation with colonoscopy results.    04/23/2016 Imaging    CT Chest W Contrast IMPRESSION: 1. No evidence of pulmonary metastasis. 2. Bilateral calcified pulmonary nodules and mediastinal lymph nodes consistent with granulomatous disease. 3. Centrilobular emphysema. 4. Coronary artery calcification and aortic atherosclerotic calcification.    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 Colon, segmental resection for tumor, sigmoid - INVASIVE ADENOCARCINOMA, WELL TO MODERATELY DIFFERENTIATED, WITH ABUNDANT  MUCIN (PT3 PN1) - METASTATIC ADENOCARCINOMA IN ONE OF TWENTY-TWO LYMPH NODES (1/22) - TWO TUMOR DEPOSITS PRESENT - MARGINS UNINVOLVED BY CARCINOMA - HYPERPLASTIC POLYPS (X2) - SEE ONCOLOGY TABLE BELOW 2. Colon, resection margin (donut), final distal margin - BENIGN COLON - NO MALIGNANCY IDENTIFIED    05/21/2016 Surgery    LAPAROSCOPIC PARTIAL COLECTOMY, SPLENIC FLEXURE MOBILIZATION AND PROCTOSCOPY    06/25/2016 - 09/03/2016 Chemotherapy    FOLFOX every 2 weeks for 3 months     12/03/2016 Imaging    CT AP 12/03/16 IMPRESSION: Status post sigmoid resection. No evidence of recurrent or metastatic disease.    05/29/2017 Procedure    05/29/2017 Colonoscopy - One 7 mm polyp in the descending colon, removed with a cold snare. Resected and retrieved. - One 4 mm polyp in the transverse colon, removed with a cold biopsy forceps. Resected and retrieved. - Patent end-to-end colo-colonic anastomosis, characterized by healthy appearing mucosa. - Internal hemorrhoids. - The examination was otherwise normal on direct and retroflexion views.    05/29/2017 Pathology Results    05/29/2017 Surgical Pathology Diagnosis 1. Surgical [P], random sites - BENIGN COLONIC MUCOSA. - NO ACTIVE INFLAMMATION OR EVIDENCE OF MICROSCOPIC COLITIS. - NO DYSPLASIA OR MALIGNANCY. 2. Surgical [P], descending and transverse, polyp (2) - HYPERPLASTIC POLYP (X2 FRAGMENTS). - NO DYSPLASIA OR MALIGNANCY.    08/29/2017 Imaging    08/29/2017 CT CAP W Contrast IMPRESSION: 1. No findings of active malignancy. 2. 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 Chart review indicates that the patient was seen for this by Dr. Gae Gallop on 01/02/2017. 3. 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 4. Other imaging findings  of potential clinical significance: Aortic Atherosclerosis (ICD10-I70.0) and Emphysema (ICD10-J43.9). Coronary atherosclerosis. 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 Small right scrotal hydrocele     HISTORY OF PRESENTING ILLNESS:  Mario Mcdowell 67 y.o. male is here because of recently diagnosed colorectal cancer. He has reported to Dr. [name] on 0/9/73 for a evaluation of a rectosigmoid colon mass. 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 He denied any change in his bowel movement or habits of rectal bleeding.   He had a screening colonoscopy by Dr. Fuller Plan 03/12/2016. Who referred him to [name]. He denies any symptoms before. Before surgery, no changes in weight loss or change in energy level  Since the surgery, his appetite has been good and his energy level is fine.No pain reported from the incision. He takes hydrocodone for his pain.  August 08, 2015 he was admitted to the hospital for right lower quadrant abdominal pain due to a kidney stone.  He presents to the clinic today with his wife for consult of his stage 3 colon cancer and chemotherapy. The patient's wife says he doesn't go to the doctor often. He is complaint and wants to do whatever he can to get treated. He is worried about what he can eat when he can start chemo.  CURRENT THERAPY: Surveillance   INTERVAL HISTORY:   Dain Laseter returns for follow up for his colon cancer. He is here alone. He is feeling well and is having vascular bypass surgery soon. He reports having leg pain when he walks.    MEDICAL HISTORY:  Past Medical History:  Diagnosis Date  . Aorto-iliac atherosclerosis ([place])    by CT; in bilateral legs   . Arthritis   . Atherosclerotic PVD with intermittent claudication ([place])   . BPH without obstruction/lower urinary tract symptoms 10/25/2015  . Centrilobular emphysema ([place])    per chest CT 04-23-2016  . Colon adenocarcinoma [facility]) oncologist- dr Burr Medico   dx 05-21-2016 -- Stage IIIB (pT3,pN1,M0) , Invasive sigmoid adenocarcinoma well to moderately differentiated with metastatic intra-abdominal  lymph node (1 out of 22), s/p lap. partial colectomy [name]) 05/21/2016 and chemotherapy is planned  . Full dentures   . History of kidney stones 07/2015   passed without intervention  . Pulmonary nodules    per chest CT 04-23-2016 bilateral nodules (consistent w/ granulomatous disease)  . Smokers' cough ([place])   . Wears contact lenses     SURGICAL HISTORY: Past Surgical History:  Procedure Laterality Date  . COLONOSCOPY  03/12/2016   4 polyps, sigmoid tumor biopsied (TVA w/ MICROSCOPIC FOCUS SUSPICIOUS FOR INVASION) pending surgical resection, rpt 1 yr Fuller Plan)  . COLONOSCOPY  05/2017   2 benign polyps, rpt 2 yrs Fuller Plan)  . LAPAROSCOPIC PARTIAL COLECTOMY N/A 05/21/2016   colon cancer, 1/20 positive lymp nodes. LAPAROSCOPIC PARTIAL COLECTOMY, SPLENIC FLEXURE MOBILIZATION AND PROCTOSCOPY;  [name], MD)  . MULTIPLE TOOTH EXTRACTIONS  1980's  . PORT-A-CATH REMOVAL Right 10/04/2016   Procedure: REMOVAL PORT-A-CATH;  Surgeon: [name], MD;  Location: [facility];  Service: General;  Laterality: Right;  . PORTACATH PLACEMENT N/A 06/21/2016   Procedure: INSERTION PORT-A-CATH;  Surgeon: [name], MD;  Location: [facility];  Service: General;  Laterality: N/A;  right    SOCIAL HISTORY: Social History   Socioeconomic History  . Marital status: Married    Spouse name: Not on file  . Number of children: Not on file  . Years of education: Not on file  .  Highest education level: Not on file  Occupational History  . Not on file  Social Needs  . Financial resource strain: Not on file  . Food insecurity:    Worry: Not on file    Inability: Not on file  . Transportation needs:    Medical: Not on file    Non-medical: Not on file  Tobacco Use  . Smoking status: Current Every Day Smoker    Packs/day: 1.00    Years: 46.00    Pack years: 46.00    Types: Cigarettes  . Smokeless tobacco: Never Used  . Tobacco comment: trying to quit -reduced from  1.5ppd  Substance and Sexual Activity  . Alcohol use: No    Comment: quit 2000  . Drug use: No  . Sexual activity: Never    Birth control/protection: Abstinence  Lifestyle  . Physical activity:    Days per week: Not on file    Minutes per session: Not on file  . Stress: Not on file  Relationships  . Social connections:    Talks on phone: Not on file    Gets together: Not on file    Attends religious service: Not on file    Active member of club or organization: Not on file    Attends meetings of clubs or organizations: Not on file    Relationship status: Not on file  . Intimate partner violence:    Fear of current or ex partner: Not on file    Emotionally abused: Not on file    Physically abused: Not on file    Forced sexual activity: Not on file  Other Topics Concern  . Not on file  Social History Narrative   Lives with wife, adopted daughter, adopted son   Occ: Games developer   Edu: 8th grade     FAMILY HISTORY: Family History  Problem Relation Age of Onset  . Cancer Paternal Aunt        unsure details  . Diabetes Cousin   . Cancer Paternal Uncle        unknow type cancer   . Prostate cancer Neg Hx   . Kidney cancer Neg Hx   . CAD Neg Hx   . Stroke Neg Hx   . Colon cancer Neg Hx     ALLERGIES:  is allergic to penicillins and lipitor [atorvastatin].  MEDICATIONS:  Current Outpatient Medications  Medication Sig Dispense Refill  . aspirin EC 81 MG tablet Take 81 mg by mouth daily.    Marland Kitchen lovastatin (MEVACOR) 40 MG tablet Take 1 tablet (40 mg total) by mouth at bedtime. 30 tablet 6   Current Facility-Administered Medications  Medication Dose Route Frequency Provider Last Rate Last Dose  . 0.9 %  sodium chloride infusion  500 mL Intravenous Once Ladene Artist, MD        REVIEW OF SYSTEMS:  Constitutional: Denies fevers, chills or abnormal night sweats  Eyes: Denies blurriness of vision, double vision or watery eyes Ears, nose, mouth, throat, and face: Denies  mucositis or sore throat Respiratory: Denies cough, dyspnea or wheezes Cardiovascular: Denies palpitation, chest discomfort or lower extremity swelling  Gastrointestinal:  Denies heartburn or change in bowel habits  Skin: Denies abnormal skin rashes (+) sun burn  Lymphatics: Denies new lymphadenopathy or easy bruising  Neurological:Denies numbness, tingling or new weaknesses (+) numbness in lower extremities from walking long distance Behavioral/Psych: Mood is stable, no new changes  All other systems were reviewed with the patient and are negative.  PHYSICAL EXAMINATION:  ECOG PERFORMANCE STATUS: 1  Vitals:   09/02/17 1432  BP: 111/71  Pulse: 64  Resp: 17  Temp: 98.4 F (36.9 C)  SpO2: 99%   Filed Weights   09/02/17 1432  Weight: 121 lb 1.6 oz (54.9 kg)    GENERAL:alert, no distress and comfortable. Seen in infusion area, presents in treatment chair. SKIN: skin color, texture, turgor are normal, no rashes or significant lesions EYES: normal, conjunctiva are pink and non-injected, sclera clear OROPHARYNX:no exudate, no erythema and lips, buccal mucosa, and tongue normal  NECK: supple, thyroid normal size, non-tender, without nodularity LYMPH:  no palpable lymphadenopathy in the cervical, axillary or inguinal LUNGS: clear to auscultation and percussion with normal breathing effort HEART: regular rate & rhythm and no murmurs and no lower extremity edema ABDOMEN:abdomen soft, non-tender and normal bowel sounds, surgical incisions are well healed  Musculoskeletal:no cyanosis of digits and no clubbing  PSYCH: alert & oriented x 3 with fluent speech NEURO: no focal motor/sensory deficits  LABORATORY DATA:  I have reviewed the data as listed CBC Latest Ref Rng & Units 08/29/2017 06/03/2017 03/07/2017  WBC 4.0 - 10.3 K/uL 8.1 9.3 7.4  Hemoglobin 13.0 - 17.1 g/dL 15.7 14.0 14.2  Hematocrit 38.4 - 49.9 % 46.1 40.8 41.3  Platelets 140 - 400 K/uL 161 172 176   CMP Latest Ref Rng &  Units 08/29/2017 06/03/2017 03/07/2017  Glucose 70 - 99 mg/dL 83 80 161(H)  BUN 8 - 23 mg/dL _0 Creatinine 0.61 - 1.24 mg/dL 0.98 0.88 1.10  Sodium 135 - 145 mmol/L 141 136 139  Potassium 3.5 - 5.1 mmol/L 4.6 3.9 4.0  Chloride 98 - 111 mmol/L 103 104 105  CO2 22 - 32 mmol/L _1 Calcium 8.9 - 10.3 mg/dL 9.7 8.9 9.2  Total Protein 6.5 - 8.1 g/dL 7.5 6.8 6.6  Total Bilirubin 0.3 - 1.2 mg/dL 0.3 <0.2(L) <0.2(L)  Alkaline Phos 38 - 126 U/L 86 91 86  AST 15 - 41 U/L _2 ALT 0 - 44 U/L _3 Tumor Marker Results for AVEON, COLQUHOUN (MRN [id number]) as of 09/02/2017 23:30  Ref. Range 12/03/2016 07:48 03/07/2017 13:28 06/03/2017 12:15 08/29/2017 08:44  CEA ([facility]) Latest Ref Range: 0.00 - 5.00 ng/mL 4.61 3.71 3.99 5.69 (H)    Pathology report  05/29/2017 Surgical Pathology Diagnosis 1. Surgical [P], random sites - BENIGN COLONIC MUCOSA. - NO ACTIVE INFLAMMATION OR EVIDENCE OF MICROSCOPIC COLITIS. - NO DYSPLASIA OR MALIGNANCY. 2. Surgical [P], descending and transverse, polyp (2) - HYPERPLASTIC POLYP (X2 FRAGMENTS). - NO DYSPLASIA OR MALIGNANCY. Specimen Comment 1. Personal history of colon cancer; diarrhea, unspecified type Specimen(s) Obtained: 1. Surgical [P], random sites 2. Surgical [P], descending and transverse, polyp (2) Specimen Clinical Information 1. R/O microscopic colitis 2. R/O adenoma Gross 1. Received in formalin are tan, soft tissue fragments that are submitted in toto. Number: 4, Size: 0.3 cm smallest to 0.5 cm largest, (1 B) ( TA ) 2. Received in formalin are tan, soft tissue fragments that are submitted in toto. Number: 2, Size: 0.2 cm smallest to 0.4 cm largest, (1 B) ( TA )   Diagnosis 05/21/16 1. Colon, segmental resection for tumor, sigmoid - INVASIVE ADENOCARCINOMA, WELL TO MODERATELY DIFFERENTIATED, WITH ABUNDANT MUCIN (PT3 PN1) - METASTATIC ADENOCARCINOMA IN ONE OF TWENTY-TWO LYMPH NODES (1/22) - TWO TUMOR DEPOSITS PRESENT -  MARGINS UNINVOLVED BY CARCINOMA - HYPERPLASTIC POLYPS (X2) - SEE ONCOLOGY TABLE  BELOW 2. Colon, resection margin (donut), final distal margin - BENIGN COLON - NO MALIGNANCY IDENTIFIED Microscopic Comment 1. ONCOLOGY TABLE: COLON Specimen: Sigmoid colon Procedure: Sigmoidectomy Tumor site: Sigmoid Macroscopic tumor perforation: Not identified Invasive tumor: 3.4 x 2.5 x 0.6 cm Histologic type(s): Adenocarcinoma with abundant mucin Histologic grade and differentiation: G1- G2 Microscopic extension of invasive tumor: Tumor invades muscularis propria Lymph-Vascular invasion: Not identified Perineural invasion: Not identified Treatment Effect: No known presurgical therapy Tumor deposit(s): Present, 2 Resection margins: All margins are uninvolved by invasive carcinoma, high-grade dysplasia, intramucosal adenocarcinoma, and adenoma Margins examined: Proximal, distal and Radial Distance closest margin (if all above margins negative): 3.6 cm (distal) Additional polyp(s): Hyperplastic polyps (x2) Lymph nodes: number examined 22; number positive: 1 1 of 5 Supplemental copy SUPPLEMENTAL for OUSMANE, SEEMAN [phone]) Microscopic Comment(continued) Pathologic Staging: pT3, pN1 Ancillary studies: performed on block 1G (MMR by immunohistochemistry and MSI by PCR; will be reported separately.) Dr. Lyndon Code has reviewed the pertinent slide(1E) and agrees that adenocarcinoma is present in this sample.   RADIOGRAPHIC STUDIES: I have personally reviewed the radiological images as listed and agreed with the findings in the report.  08/29/2017 CT CAP W Contrast IMPRESSION: 1. No findings of active malignancy. 2. 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 Chart review indicates that the patient was seen for this by Dr. Gae Gallop on 01/02/2017. 3. 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 4. Other imaging findings of potential  clinical significance: Aortic Atherosclerosis (ICD10-I70.0) and Emphysema (ICD10-J43.9). Coronary atherosclerosis. 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 Small right scrotal hydrocele  12/03/2016 CT AP W Contrast IMPRESSION: Status post sigmoid resection.  No evidence of recurrent or metastatic disease  CT CHEST W CONTRAST 04/23/16 IMPRESSION: 1. No evidence of pulmonary metastasis. 2. Bilateral calcified pulmonary nodules and mediastinal lymph nodes consistent with granulomatous disease. 3. Centrilobular emphysema. 4. Coronary artery calcification and aortic atherosclerotic calcification.  CT A/P W CONTRAST 03/14/16 IMPRESSION: Short segment area of wall thickening in the distal sigmoid colon, suspicious for site of primary colon carcinoma. Recommend correlation with colonoscopy results.  No evidence of local or distant metastatic disease. No evidence of bowel obstruction.  PROCEDURES  05/29/2017 Colonoscopy - One 7 mm polyp in the descending colon, removed with a cold snare. Resected and retrieved. - One 4 mm polyp in the transverse colon, removed with a cold biopsy forceps. Resected and retrieved. - Patent end-to-end colo-colonic anastomosis, characterized by healthy appearing mucosa. - Internal hemorrhoids. - The examination was otherwise normal on direct and retroflexion views.  Colonoscopy 03/12/2016 Four 6 to 8 mm polyps in the rectum and in the transverse colon, removed with a cold snare. Resected and retrieved. - Rule out malignancy, tumor in the sigmoid colon. Biopsied. Tattooed. - Internal hemorrhoids. - The examination was otherwise normal on direct and retroflexion views.   ASSESSMENT & PLAN:   67 y.o.  male  1. 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 -We previously discussed the risk of cancer recurrence after surgery. Due to his stage IIIB disease, he is at high risk for recurrence -We previously discussed the standard adjuvant chemotherapy for stage III colon cancer,  reduce the risk of cancer recurrence. -We previously discussed the regimens for adjuvant chemotherapy, including FOLFOX, CAPOX, or single  agent Xeloda or 5-FU. He is 15, but does not have many medical comorbidities and recovered from surgery quickly. I recommended him to consider FOLFOX -We previously discussed the duration of adjuvant chemotherapy. Based on the recent reported IDEA study in ASCO 2017, I recommended 3 months therapy.  -He underwent adjuvant chemotherapy 06/25/16-09/03/16, tolerated well overall, mild nausea, no neuropathy. -Port was removed on 10/04/16 by Dr. [name]  -I reviewed his surveillance CT AP from 12/03/16 which showed no evidence of recurrence.  -Pt is clinically doing well, except notes abdominal artery blockage has lead to lower extremity numbness and pain when walking long distance. He is currently on Pletal for this managed by Dr. Scot Dock. I encouraged him to quit smoking as this smoking can worsen his condition.  -He had a coloscopy by Dr. Fuller Plan on 05/29/17, he had 2 polyps found and removed, pathology is still pending. He also was seen to have internal hemorrhoids.  -He is clinically doing well without any concerning signs.  I reviewed his recent surveillance CT scan from 08/29/2017, which showed no evidence of recurrence. -Labs reviewed, his CBC and CMP are within normal limits.  His tumor marker CEA is slightly elevated, which is likely nonspecific, given his negative surveillance CT scan last week, will continue monitoring. -Continue colon cancer surveillance. -F/u in 4 months   2. Smoking cessation  -He is still smoking 1 pack or less a day. He has been trying to quit, but unsuccessful.  -I again strongly  encouraged him to work towards quitting. I recommended Nicotine patches or counseling to help him quit. He declined at this time.  -I discussed smoking causes blockage of his blood vessels and stroke.  -He notes he has thought of smoking but has not tried any medication or patches to help. I suggest he tries Chantax managed by his PCP to help him.   3. Emphysema  4. BPH -01/09/17 PSA normal at 0.55 -Will continue to be followed by his PCP.   5. History of kidney stones  6.PAD -He will see his vascular surgeon  -His recent CT scan showed complete occlusion of his abdominal aorta, he was recently seen by his vascular surgeon Dr. Doren Custard in 06/2017.  Patient has not decided if he wants to proceed with surgery.  PLAN -Scan and lab reviewed, no evidence of recurrence.   -F/u and labs in 4 months    All questions were answered. The patient knows to call the clinic with any problems, questions or concerns.  I spent 20 minutes counseling the patient face to face. The total time spent in the appointment was 25 minutes and more than 50% was on counseling.  Dierdre Searles Dweik am acting as scribe for Dr. Truitt Merle.  I have reviewed the above documentation for accuracy and completeness, and I agree with the above.     Truitt Merle, MD 09/02/2017

## 2017-08-29 ENCOUNTER — Inpatient Hospital Stay: Payer: Medicare Other | Attending: Hematology

## 2017-08-29 ENCOUNTER — Ambulatory Visit (HOSPITAL_COMMUNITY)
Admission: RE | Admit: 2017-08-29 | Discharge: 2017-08-29 | Disposition: A | Discharge status: Home / Self Care | Payer: Medicare Other | Source: Ambulatory Visit | Attending: Hematology | Admitting: Hematology

## 2017-08-29 ENCOUNTER — Encounter (HOSPITAL_COMMUNITY): Payer: Self-pay

## 2017-08-29 DIAGNOSIS — F17210 Nicotine dependence, cigarettes, uncomplicated: Secondary | ICD-10-CM | POA: Diagnosis not present

## 2017-08-29 DIAGNOSIS — C772 Secondary and unspecified malignant neoplasm of intra-abdominal lymph nodes: Secondary | ICD-10-CM

## 2017-08-29 DIAGNOSIS — I2510 Atherosclerotic heart disease of native coronary artery without angina pectoris: Secondary | ICD-10-CM | POA: Diagnosis not present

## 2017-08-29 DIAGNOSIS — Z800 Family history of malignant neoplasm of digestive organs: Secondary | ICD-10-CM | POA: Insufficient documentation

## 2017-08-29 DIAGNOSIS — Z85038 Personal history of other malignant neoplasm of large intestine: Secondary | ICD-10-CM | POA: Diagnosis not present

## 2017-08-29 DIAGNOSIS — N400 Enlarged prostate without lower urinary tract symptoms: Secondary | ICD-10-CM | POA: Diagnosis not present

## 2017-08-29 DIAGNOSIS — I708 Atherosclerosis of other arteries: Secondary | ICD-10-CM | POA: Diagnosis not present

## 2017-08-29 DIAGNOSIS — R918 Other nonspecific abnormal finding of lung field: Secondary | ICD-10-CM | POA: Insufficient documentation

## 2017-08-29 DIAGNOSIS — Z7982 Long term (current) use of aspirin: Secondary | ICD-10-CM | POA: Diagnosis not present

## 2017-08-29 DIAGNOSIS — D71 Functional disorders of polymorphonuclear neutrophils: Secondary | ICD-10-CM | POA: Insufficient documentation

## 2017-08-29 DIAGNOSIS — C187 Malignant neoplasm of sigmoid colon: Secondary | ICD-10-CM

## 2017-08-29 DIAGNOSIS — J439 Emphysema, unspecified: Secondary | ICD-10-CM | POA: Insufficient documentation

## 2017-08-29 DIAGNOSIS — Z87442 Personal history of urinary calculi: Secondary | ICD-10-CM | POA: Insufficient documentation

## 2017-08-29 DIAGNOSIS — I700 Atherosclerosis of aorta: Secondary | ICD-10-CM | POA: Insufficient documentation

## 2017-08-29 DIAGNOSIS — K648 Other hemorrhoids: Secondary | ICD-10-CM | POA: Insufficient documentation

## 2017-08-29 DIAGNOSIS — Z9221 Personal history of antineoplastic chemotherapy: Secondary | ICD-10-CM | POA: Diagnosis not present

## 2017-08-29 DIAGNOSIS — Z86010 Personal history of colonic polyps: Secondary | ICD-10-CM | POA: Insufficient documentation

## 2017-08-29 DIAGNOSIS — C189 Malignant neoplasm of colon, unspecified: Secondary | ICD-10-CM | POA: Diagnosis not present

## 2017-08-29 DIAGNOSIS — I7409 Other arterial embolism and thrombosis of abdominal aorta: Secondary | ICD-10-CM | POA: Insufficient documentation

## 2017-08-29 DIAGNOSIS — Z79899 Other long term (current) drug therapy: Secondary | ICD-10-CM | POA: Insufficient documentation

## 2017-08-29 DIAGNOSIS — N433 Hydrocele, unspecified: Secondary | ICD-10-CM | POA: Diagnosis not present

## 2017-08-29 DIAGNOSIS — I739 Peripheral vascular disease, unspecified: Secondary | ICD-10-CM | POA: Insufficient documentation

## 2017-08-29 LAB — CBC WITH DIFFERENTIAL/PLATELET
BASOS ABS: 0 10*3/uL (ref 0.0–0.1)
BASOS PCT: 0 %
EOS ABS: 0.1 10*3/uL (ref 0.0–0.5)
EOS PCT: 2 %
HEMATOCRIT: 46.1 % (ref 38.4–49.9)
Hemoglobin: 15.7 g/dL (ref 13.0–17.1)
Lymphocytes Relative: 50 %
Lymphs Abs: 4.1 10*3/uL — ABNORMAL HIGH (ref 0.9–3.3)
MCH: 32 pg (ref 27.2–33.4)
MCHC: 34.1 g/dL (ref 32.0–36.0)
MCV: 94.1 fL (ref 79.3–98.0)
MONO ABS: 0.8 10*3/uL (ref 0.1–0.9)
MONOS PCT: 10 %
NEUTROS ABS: 3.1 10*3/uL (ref 1.5–6.5)
Neutrophils Relative %: 38 %
PLATELETS: 161 10*3/uL (ref 140–400)
RBC: 4.9 MIL/uL (ref 4.20–5.82)
RDW: 14.9 % — AB (ref 11.0–14.6)
WBC: 8.1 10*3/uL (ref 4.0–10.3)

## 2017-08-29 LAB — COMPREHENSIVE METABOLIC PANEL
ALBUMIN: 4.1 g/dL (ref 3.5–5.0)
ALK PHOS: 86 U/L (ref 38–126)
ALT: 23 U/L (ref 0–44)
ANION GAP: 11 (ref 5–15)
AST: 22 U/L (ref 15–41)
BILIRUBIN TOTAL: 0.3 mg/dL (ref 0.3–1.2)
BUN: 15 mg/dL (ref 8–23)
CALCIUM: 9.7 mg/dL (ref 8.9–10.3)
CO2: 27 mmol/L (ref 22–32)
CREATININE: 0.98 mg/dL (ref 0.61–1.24)
Chloride: 103 mmol/L (ref 98–111)
GFR calc Af Amer: >60 mL/min (ref >60)
GFR calc non Af Amer: >60 mL/min (ref >60)
GLUCOSE: 83 mg/dL (ref 70–99)
Potassium: 4.6 mmol/L (ref 3.5–5.1)
SODIUM: 141 mmol/L (ref 135–145)
TOTAL PROTEIN: 7.5 g/dL (ref 6.5–8.1)

## 2017-08-29 LAB — CEA (IN HOUSE-CHCC): CEA (CHCC-In House): 5.69 ng/mL — ABNORMAL HIGH (ref 0.00–5.00)

## 2017-08-29 MED ORDER — IOHEXOL 300 MG/ML  SOLN
100.0000 mL | Freq: Once | INTRAMUSCULAR | Status: AC | PRN
  Administered 2017-08-29: 80 mL via INTRAVENOUS

## 2017-08-30 ENCOUNTER — Encounter: Payer: Self-pay | Admitting: Podiatry

## 2017-08-30 ENCOUNTER — Ambulatory Visit: Payer: Medicare Other | Admitting: Podiatry

## 2017-08-30 DIAGNOSIS — Q828 Other specified congenital malformations of skin: Secondary | ICD-10-CM

## 2017-09-02 ENCOUNTER — Encounter: Payer: Self-pay | Admitting: Hematology

## 2017-09-02 ENCOUNTER — Other Ambulatory Visit: Payer: Medicare Other

## 2017-09-02 ENCOUNTER — Telehealth: Payer: Self-pay | Admitting: Hematology

## 2017-09-02 ENCOUNTER — Inpatient Hospital Stay (HOSPITAL_BASED_OUTPATIENT_CLINIC_OR_DEPARTMENT_OTHER): Payer: Medicare Other | Admitting: Hematology

## 2017-09-02 VITALS — BP 111/71 | HR 64 | Temp 98.4°F | Resp 17 | Ht 65.0 in | Wt 121.1 lb

## 2017-09-02 DIAGNOSIS — C187 Malignant neoplasm of sigmoid colon: Secondary | ICD-10-CM

## 2017-09-02 DIAGNOSIS — K648 Other hemorrhoids: Secondary | ICD-10-CM | POA: Diagnosis not present

## 2017-09-02 DIAGNOSIS — I700 Atherosclerosis of aorta: Secondary | ICD-10-CM

## 2017-09-02 DIAGNOSIS — J439 Emphysema, unspecified: Secondary | ICD-10-CM

## 2017-09-02 DIAGNOSIS — N433 Hydrocele, unspecified: Secondary | ICD-10-CM

## 2017-09-02 DIAGNOSIS — Z7982 Long term (current) use of aspirin: Secondary | ICD-10-CM

## 2017-09-02 DIAGNOSIS — F17210 Nicotine dependence, cigarettes, uncomplicated: Secondary | ICD-10-CM | POA: Diagnosis not present

## 2017-09-02 DIAGNOSIS — Z87442 Personal history of urinary calculi: Secondary | ICD-10-CM

## 2017-09-02 DIAGNOSIS — R918 Other nonspecific abnormal finding of lung field: Secondary | ICD-10-CM

## 2017-09-02 DIAGNOSIS — Z86010 Personal history of colonic polyps: Secondary | ICD-10-CM

## 2017-09-02 DIAGNOSIS — Z85038 Personal history of other malignant neoplasm of large intestine: Secondary | ICD-10-CM

## 2017-09-02 DIAGNOSIS — I2510 Atherosclerotic heart disease of native coronary artery without angina pectoris: Secondary | ICD-10-CM

## 2017-09-02 DIAGNOSIS — Z9221 Personal history of antineoplastic chemotherapy: Secondary | ICD-10-CM

## 2017-09-02 DIAGNOSIS — C772 Secondary and unspecified malignant neoplasm of intra-abdominal lymph nodes: Principal | ICD-10-CM

## 2017-09-02 DIAGNOSIS — Z79899 Other long term (current) drug therapy: Secondary | ICD-10-CM

## 2017-09-02 DIAGNOSIS — Z800 Family history of malignant neoplasm of digestive organs: Secondary | ICD-10-CM

## 2017-09-02 DIAGNOSIS — I739 Peripheral vascular disease, unspecified: Secondary | ICD-10-CM

## 2017-09-02 DIAGNOSIS — N400 Enlarged prostate without lower urinary tract symptoms: Secondary | ICD-10-CM

## 2017-09-02 NOTE — Telephone Encounter (Signed)
Appts scheduled AVS/Calendar printed per 8/19 los °

## 2017-09-03 NOTE — Progress Notes (Signed)
Subjective: 67 y.o. returns the office today for painful calluses to both of his feet which she could not trim.  Denies any redness or drainage or swelling.  Denies any acute changes since last appointment and no new complaints today. Denies any systemic complaints such as fevers, chills, nausea, vomiting.   PCP: Ria Bush, MD   Objective: AAO 3, NAD DP/PT pulses palpable, CRT less than 3 seconds Hyperkeratotic lesions bilateral submetatarsal 5, medial hallux and left heel.  Upon treatment there is no underlying ulceration, drainage or any signs of infection. No open lesions or other pre-ulcerative lesions are identified. No other areas of tenderness bilateral lower extremities. No overlying edema, erythema, increased warmth. No pain with calf compression, swelling, warmth, erythema.  Assessment: Patient presents with painful hyperkeratotic lesions  Plan: -Treatment options including alternatives, risks, complications were discussed -Hyperkeratotic lesion sharply debrided x4 without any complications or bleeding. -Discussed daily foot inspection. If there are any changes, to call the office immediately.  -Follow-up in 3 months or sooner if any problems are to arise. In the meantime, encouraged to call the office with any questions, concerns, changes symptoms.  Celesta Gentile, DPM

## 2017-09-04 ENCOUNTER — Ambulatory Visit: Payer: Medicare Other | Admitting: Urology

## 2017-11-18 ENCOUNTER — Other Ambulatory Visit: Payer: Self-pay

## 2017-11-18 DIAGNOSIS — R0989 Other specified symptoms and signs involving the circulatory and respiratory systems: Secondary | ICD-10-CM

## 2017-11-18 DIAGNOSIS — I6522 Occlusion and stenosis of left carotid artery: Secondary | ICD-10-CM

## 2017-11-27 ENCOUNTER — Ambulatory Visit (INDEPENDENT_AMBULATORY_CARE_PROVIDER_SITE_OTHER): Payer: Medicare Other | Admitting: Vascular Surgery

## 2017-11-27 ENCOUNTER — Ambulatory Visit (HOSPITAL_COMMUNITY)
Admission: RE | Admit: 2017-11-27 | Discharge: 2017-11-27 | Disposition: A | Discharge status: Home / Self Care | Payer: Medicare Other | Source: Ambulatory Visit | Attending: Vascular Surgery | Admitting: Vascular Surgery

## 2017-11-27 ENCOUNTER — Other Ambulatory Visit: Payer: Self-pay

## 2017-11-27 ENCOUNTER — Encounter: Payer: Self-pay | Admitting: Vascular Surgery

## 2017-11-27 VITALS — BP 172/85 | HR 72 | Temp 97.7°F | Resp 16 | Ht 65.0 in | Wt 122.0 lb

## 2017-11-27 DIAGNOSIS — R0989 Other specified symptoms and signs involving the circulatory and respiratory systems: Secondary | ICD-10-CM | POA: Diagnosis not present

## 2017-11-27 DIAGNOSIS — I6522 Occlusion and stenosis of left carotid artery: Secondary | ICD-10-CM

## 2017-11-27 NOTE — Progress Notes (Signed)
Patient name: Mario Mcdowell MRN: [id number] DOB: Nov 29, 1950 Sex: male  REASON FOR VISIT:   Follow-up of peripheral vascular disease and carotid disease.  HPI:   Mario Mcdowell is a pleasant 67 y.o. 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  At that time his ABI was 57% of the right and 46% on the left.  I heard carotid bruits and set up a carotid duplex scan which was done in December 2018 which showed a 40 to 59% left carotid stenosis.  When I saw him last on 07/10/2017, I noted that he was due for a follow-up study at this time.  He comes in for his follow-up study.  Of note we have discussed the importance of tobacco cessation during his last visit.  Since I saw him last, he denies any history of stroke, TIAs, expressive or receptive aphasia, or amaurosis fugax.  He is on aspirin and is on a statin.  His main complaint is bilateral lower extremity claudication.  He states that both legs give out on him after walking a very short distance of less than 1 block.  He denies any history of rest pain or nonhealing ulcers.  He does continue to smoke a pack per day of cigarettes and is been smoking since he was 67 years old.  He denies any recent cardiac symptoms.  Past Medical History:  Diagnosis Date  . Aorto-iliac atherosclerosis ([place])    by CT; in bilateral legs   . Arthritis   . Atherosclerotic PVD with intermittent claudication ([place])   . BPH without obstruction/lower urinary tract symptoms 10/25/2015  . Centrilobular emphysema ([place])    per chest CT 04-23-2016  . Colon adenocarcinoma [facility]) oncologist- dr Burr Medico   dx 05-21-2016 -- Stage IIIB (pT3,pN1,M0) , Invasive sigmoid adenocarcinoma well to moderately differentiated with metastatic intra-abdominal lymph node (1 out of 22), s/p lap. partial colectomy [name]) 05/21/2016 and chemotherapy is planned  . Full dentures   . History of kidney stones 07/2015   passed without intervention  . Pulmonary  nodules    per chest CT 04-23-2016 bilateral nodules (consistent w/ granulomatous disease)  . Smokers' cough ([place])   . Wears contact lenses     Family History  Problem Relation Age of Onset  . Cancer Paternal Aunt        unsure details  . Diabetes Cousin   . Cancer Paternal Uncle        unknow type cancer   . Prostate cancer Neg Hx   . Kidney cancer Neg Hx   . CAD Neg Hx   . Stroke Neg Hx   . Colon cancer Neg Hx     SOCIAL HISTORY: Social History   Tobacco Use  . Smoking status: Current Every Day Smoker    Packs/day: 1.00    Years: 46.00    Pack years: 46.00    Types: Cigarettes  . Smokeless tobacco: Never Used  Substance Use Topics  . Alcohol use: No    Comment: quit 2000    Allergies  Allergen Reactions  . Penicillins Swelling    Has patient had a PCN reaction causing immediate rash, facial/tongue/throat swelling, SOB or lightheadedness with hypotension: Yes Has patient had a PCN reaction causing severe rash involving mucus membranes or skin necrosis: No Has patient had a PCN reaction that required hospitalization No Has patient had a PCN reaction occurring within the last 10 years: No If all of the above answers are "  NO", then may proceed with Cephalosporin use.   . Lipitor [Atorvastatin] Nausea Only    Current Outpatient Medications  Medication Sig Dispense Refill  . aspirin EC 81 MG tablet Take 81 mg by mouth daily.    Marland Kitchen lovastatin (MEVACOR) 40 MG tablet Take 1 tablet (40 mg total) by mouth at bedtime. 30 tablet 6   Current Facility-Administered Medications  Medication Dose Route Frequency Provider Last Rate Last Dose  . 0.9 %  sodium chloride infusion  500 mL Intravenous Once Ladene Artist, MD        REVIEW OF SYSTEMS:  [X]  denotes positive finding, [ ]  denotes negative finding Cardiac  Comments:  Chest pain or chest pressure:    Shortness of breath upon exertion:    Short of breath when lying flat:    Irregular heart rhythm:        Vascular     Pain in calf, thigh, or hip brought on by ambulation:    Pain in feet at night that wakes you up from your sleep:     Blood clot in your veins:    Leg swelling:         Pulmonary    Oxygen at home:    Productive cough:     Wheezing:         Neurologic    Sudden weakness in arms or legs:     Sudden numbness in arms or legs:     Sudden onset of difficulty speaking or slurred speech:    Temporary loss of vision in one eye:     Problems with dizziness:         Gastrointestinal    Blood in stool:     Vomited blood:         Genitourinary    Burning when urinating:     Blood in urine:        Psychiatric    Major depression:         Hematologic    Bleeding problems:    Problems with blood clotting too easily:        Skin    Rashes or ulcers:        Constitutional    Fever or chills:     PHYSICAL EXAM:   Vitals:   11/27/17 1508 11/27/17 1513  BP: (!) 162/79 (!) 172/85  Pulse: 72   Resp: 16   Temp: 97.7 F (36.5 C)   TempSrc: Oral   SpO2: 98%   Weight: 122 lb (55.3 kg)   Height: 5\' 5"  (1.651 m)     GENERAL: The patient is a well-nourished male, in no acute distress. The vital signs are documented above. CARDIAC: There is a regular rate and rhythm.  VASCULAR: He does have bilateral carotid bruits. I cannot palpate femoral, popliteal, or pedal pulses. He has no significant lower extremity swelling. PULMONARY: There is good air exchange bilaterally without wheezing or rales. ABDOMEN: Soft and non-tender with normal pitched bowel sounds.  MUSCULOSKELETAL: There are no major deformities or cyanosis. NEUROLOGIC: No focal weakness or paresthesias are detected. SKIN: There are no ulcers or rashes noted. PSYCHIATRIC: The patient has a normal affect.  DATA:    CAROTID DUPLEX: I have independently interpreted his carotid duplex scan today.  On the right side he is a less than 39% stenosis.  The right subclavian artery has normal flow patterns.  He has antegrade flow  in the right vertebral artery.  On the left side he has  a less than 39% stenosis.  He has monophasic flow in the left subclavian artery with atypical flow in the left vertebral artery.  CT ABDOMEN PELVIS: The patient does have a history of colon cancer and underwent a routine follow-up CT of the abdomen and pelvis on 08/29/2017.  I have reviewed those images.  This was not a dedicated CT angiogram.  However based on the study he has severe calcific disease of his infrarenal aorta and therefore is likely not a good candidate for aortofemoral bypass grafting.  I do not really see a good place to clamp the infrarenal aorta.  MEDICAL ISSUES:   HISTORY OF MODERATE LEFT CAROTID STENOSIS: Previous study had suggested a 40 to 59% left carotid stenosis.  His follow-up study today shows significant improvement with a less than 39% stenosis bilaterally.  He is asymptomatic.  He is on aspirin and is on a statin.  I think we can stretch his follow-up out to 1 year and I have ordered a follow-up duplex scan in 1 year.  We have again discussed the importance of tobacco cessation.  AORTOILIAC OCCLUSIVE DISEASE: This patient has a known aortic occlusion.  He has significant disabling claudication of both lower extremities and would like to consider revascularization.  As per my previous notes this would likely involve a right axillobifemoral bypass.  I do not think he is a good candidate for aortofemoral bypass grafting given the severe calcific disease of his aorta.  He has evidence of some disease in the left subclavian artery so this would be a right axillofemoral bypass.  I have recommended that we obtain preoperative cardiac evaluation first.  If he is felt to be a reasonable candidate for surgery that I think we need to somehow evaluate his femoral arteries either with a CT angiogram or at least duplex to be sure that there is a distal target.  I will have to see him back in the office before considering surgery.  I  have discussed the indications for axillofemoral bypass grafting and the potential complications including but not limited to the risk of graft thrombosis and graft infection.  Deitra Mayo [facility] [phone]

## 2017-11-29 ENCOUNTER — Encounter: Payer: Self-pay | Admitting: Cardiology

## 2017-11-29 ENCOUNTER — Ambulatory Visit (INDEPENDENT_AMBULATORY_CARE_PROVIDER_SITE_OTHER): Payer: Medicare Other | Admitting: Cardiology

## 2017-11-29 VITALS — BP 114/66 | HR 64 | Ht 65.0 in | Wt 123.4 lb

## 2017-11-29 DIAGNOSIS — E7800 Pure hypercholesterolemia, unspecified: Secondary | ICD-10-CM | POA: Diagnosis not present

## 2017-11-29 DIAGNOSIS — I708 Atherosclerosis of other arteries: Secondary | ICD-10-CM

## 2017-11-29 DIAGNOSIS — Z716 Tobacco abuse counseling: Secondary | ICD-10-CM

## 2017-11-29 DIAGNOSIS — I700 Atherosclerosis of aorta: Secondary | ICD-10-CM

## 2017-11-29 DIAGNOSIS — Z7189 Other specified counseling: Secondary | ICD-10-CM

## 2017-11-29 DIAGNOSIS — I6523 Occlusion and stenosis of bilateral carotid arteries: Secondary | ICD-10-CM | POA: Diagnosis not present

## 2017-11-29 DIAGNOSIS — Z01818 Encounter for other preprocedural examination: Secondary | ICD-10-CM

## 2017-11-29 NOTE — Patient Instructions (Signed)
Medication Instructions:  Your Physician recommend you continue on your current medication as directed.    If you need a refill on your cardiac medications before your next appointment, please call your pharmacy.   Lab work: None  Testing/Procedures: none  Follow-Up: At [facility], you and your health needs are our priority.  As part of our continuing mission to provide you with exceptional heart care, we have created designated Provider Care Teams.  These Care Teams include your primary Cardiologist (physician) and Advanced Practice Providers (APPs -  Physician Assistants and Nurse Practitioners) who all work together to provide you with the care you need, when you need it. You will need a follow up appointment as needed.  Please call our office 2 months in advance to schedule this appointment.  You may see Buford Dresser, MD or one of the following Advanced Practice Providers on your designated Care Team:   Rosaria Ferries, PA-C . Jory Sims, DNP, ANP  Any Other Special Instructions Will Be Listed Below (If Applicable).

## 2017-11-29 NOTE — Progress Notes (Signed)
Cardiology Office Note:    Date:  11/29/2017   ID:  Mario Mcdowell, DOB Nov 03, 1950, MRN [id number]  PCP:  Mario Bush, MD  Cardiologist:  Mario Dresser, MD PhD  Referring MD: Mario Bush, MD   CC: preoperative cardiovascular exam  History of Present Illness:    Mario Mcdowell is a 67 y.o. male with a hx of peripheral vascular/carotid disease, chronically occluded aorta, stage 3B colon cancer, tobacco abuse who is seen as a new consult at the request of Mario Bush, MD for the evaluation and management of preoperative cardiac evaluation prior to potential axillofemoral bypass surgery by Dr. Scot Dock.  Preoperative cardiovascular evaluation Pertinent past cardiac history: none Prior cardiac workup: never had stress test or echo History of valve disease: none History of CAD/PAD/CVA/TIA: yes, occluded aorta, PAD, carotid disease History of heart failure: none History of arrhythmia: none On anticoagulation: none, only baby aspirin Additional history (hypertension, diabetes/on insulin, CKD/current creatinine, OSA, anesthesia complications): denies all Tobacco: 1 ppd, not interested in quitting Current symptoms: no chest pain, shortness of breath, syncope, palpitations, LE edema, PND, orthopnea Functional capacity: got up house, cleaned gutters. Can climb a flight or more of stairs, more than a flight or two he needs to stop due to hip pain.   Past Medical History:  Diagnosis Date  . Aorto-iliac atherosclerosis ([place])    by CT; in bilateral legs   . Arthritis   . Atherosclerotic PVD with intermittent claudication ([place])   . BPH without obstruction/lower urinary tract symptoms 10/25/2015  . Centrilobular emphysema ([place])    per chest CT 04-23-2016  . Colon adenocarcinoma [facility]) oncologist- dr Burr Medico   dx 05-21-2016 -- Stage IIIB (pT3,pN1,M0) , Invasive sigmoid adenocarcinoma well to moderately differentiated with metastatic intra-abdominal lymph node (1 out of 22), s/p  lap. partial colectomy [name]) 05/21/2016 and chemotherapy is planned  . Full dentures   . History of kidney stones 07/2015   passed without intervention  . Pulmonary nodules    per chest CT 04-23-2016 bilateral nodules (consistent w/ granulomatous disease)  . Smokers' cough ([place])   . Wears contact lenses     Past Surgical History:  Procedure Laterality Date  . COLONOSCOPY  03/12/2016   4 polyps, sigmoid tumor biopsied (TVA w/ MICROSCOPIC FOCUS SUSPICIOUS FOR INVASION) pending surgical resection, rpt 1 yr [facility] Plan)  . COLONOSCOPY  05/2017   2 benign polyps, rpt 2 yrs [facility] Plan)  . LAPAROSCOPIC PARTIAL COLECTOMY N/A 05/21/2016   colon cancer, 1/20 positive lymp nodes. LAPAROSCOPIC PARTIAL COLECTOMY, SPLENIC FLEXURE MOBILIZATION AND PROCTOSCOPY;  [name], MD)  . MULTIPLE TOOTH EXTRACTIONS  1980's  . PORT-A-CATH REMOVAL Right 10/04/2016   Procedure: REMOVAL PORT-A-CATH;  Surgeon: [name], MD;  [facility];  Service: General;  Laterality: Right;  . PORTACATH PLACEMENT N/A 06/21/2016   Procedure: INSERTION PORT-A-CATH;  Surgeon: [name], MD;  Location[facility];  Service: General;  Laterality: N/A;  right    Current Medications: Current Outpatient Medications on File Prior to Visit  Medication Sig  . aspirin EC 81 MG tablet Take 81 mg by mouth daily.  Marland Kitchen lovastatin (MEVACOR) 40 MG tablet Take 1 tablet (40 mg total) by mouth at bedtime.   Current Facility-Administered Medications on File Prior to Visit  Medication  . 0.9 %  sodium chloride infusion     Allergies:   Penicillins and Lipitor [atorvastatin]   Social History   Socioeconomic History  . Marital status: Married    Spouse name: Not  on file  . Number of children: Not on file  . Years of education: Not on file  . Highest education level: Not on file  Occupational History  . Not on file  Social Needs  . Financial resource strain: Not on file  . Food insecurity:     Worry: Not on file    Inability: Not on file  . Transportation needs:    Medical: Not on file    Non-medical: Not on file  Tobacco Use  . Smoking status: Current Every Day Smoker    Packs/day: 1.00    Years: 46.00    Pack years: 46.00    Types: Cigarettes  . Smokeless tobacco: Never Used  Substance and Sexual Activity  . Alcohol use: No    Comment: quit 2000  . Drug use: No  . Sexual activity: Never    Birth control/protection: Abstinence  Lifestyle  . Physical activity:    Days per week: Not on file    Minutes per session: Not on file  . Stress: Not on file  Relationships  . Social connections:    Talks on phone: Not on file    Gets together: Not on file    Attends religious service: Not on file    Active member of club or organization: Not on file    Attends meetings of clubs or organizations: Not on file    Relationship status: Not on file  Other Topics Concern  . Not on file  Social History Narrative   Lives with wife, adopted daughter, adopted son   Occ: Games developer   Edu: 8th grade      Family History: The patient's family history includes Cancer in his paternal aunt and paternal uncle; Diabetes in his cousin. There is no history of Prostate cancer, Kidney cancer, CAD, Stroke, or Colon cancer.  ROS:   Please see the history of present illness.  Additional pertinent ROS:  Constitutional: Negative for chills, fever, night sweats, unintentional weight loss  HENT: Negative for ear pain and hearing loss.   Eyes: Negative for loss of vision and eye pain.  Respiratory: Negative for cough, sputum, shortness of breath, wheezing.   Cardiovascular: Positive for claudication. Negative for chest pain, palpitations, PND, orthopnea, lower extremity edema.  Gastrointestinal: Negative for abdominal pain, melena, and hematochezia.  Genitourinary: Negative for dysuria and hematuria.  Musculoskeletal: Negative for falls and myalgias.  Skin: Negative for itching and rash.    Neurological: Negative for focal weakness, focal sensory changes and loss of consciousness.  Endo/Heme/Allergies: Does not bruise/bleed easily.    EKGs/Labs/Other Studies Reviewed:    The following studies were reviewed today: Prior notes and imaging  EKG:  EKG is personally reviewed.  The ekg ordered today demonstrates normal sinus rhythm. No R waves in V1-V3, similar to prior ecg in 2017.  Recent Labs: 08/29/2017: ALT 23; BUN 15; Creatinine, Ser 0.98; Hemoglobin 15.7; Platelets 161; Potassium 4.6; Sodium 141  Recent Lipid Panel    Component Value Date/Time   CHOL 179 01/09/2017 0851   TRIG 93.0 01/09/2017 0851   HDL 34.30 (L) 01/09/2017 0851   CHOLHDL 5 01/09/2017 0851   VLDL 18.6 01/09/2017 0851   LDLCALC 126 (H) 01/09/2017 0851    Physical Exam:    VS:  BP 114/66   Pulse 64   Ht 5\' 5"  (1.651 m)   Wt 123 lb 6.4 oz (56 kg)   BMI 20.53 kg/m     Wt Readings from Last 3 Encounters:  11/29/17  123 lb 6.4 oz (56 kg)  11/27/17 122 lb (55.3 kg)  09/02/17 121 lb 1.6 oz (54.9 kg)     GEN: Well nourished, well developed in no acute distress HEENT: Normal NECK: No JVD; bilateral carotid bruits LYMPHATICS: No lymphadenopathy CARDIAC: regular rhythm, normal S1 and S2, no murmurs, rubs, gallops. Radial pulses 2+ bilaterally. Unable to palpate lower extremity pulses. RESPIRATORY:  Clear to auscultation without rales, wheezing or rhonchi  ABDOMEN: Soft, non-tender, non-distended MUSCULOSKELETAL:  No edema; No deformity  SKIN: Warm and dry NEUROLOGIC:  Alert and oriented x 3 PSYCHIATRIC:  Normal affect   ASSESSMENT:    1. Pre-op evaluation   2. Aorto-iliac atherosclerosis ([place])   3. Bilateral carotid artery stenosis   4. Pure hypercholesterolemia   5. Tobacco abuse counseling   6. Counseling on health promotion and disease prevention    PLAN:    1. Preoperative evaluation: pending potential surgery for axillofemoral bypass surgery (date not yet scheduled).  -RCRI  risk=1 for vascular surgery, no other risk factors According to ACC/AHA guidelines, no further cardiovascular testing needed.  The patient may proceed to surgery at acceptable risk.   He can achieve >4 METs without chest pain, and he does not have heart failure, valvular disease, or history of arrhythmia.  2. Tobacco cessation counseling: discussed that tobacco cessation is extremely important given his vascular disease and potential major surgery. He is precontemplative. The patient was counseled on tobacco cessation today for 7 minutes.  Counseling included reviewing the risks of smoking tobacco products, how it impacts the patient's current medical diagnoses and different strategies for quitting.  Pharmacotherapy to aid in tobacco cessation was discussed and declined today.  3. Significant ASCVD: PAD, carotid disease (1-39% bilaterally), hyperlipidemia -given the above, his LDL goal is <70. His last lipids noted an LDL of 126, but he believes he started the lovastatin after this. -he had nausea on atorvastatin. Given his disease and risk, would recommend trialing rosuvastatin. However, he has lipids to be rechecked next month and wants to see if he reaches goal on lovastatin. -on aspirin 81 mg -tobacco cessation as above  4. Secondary prevention: -recommend heart healthy/Mediterranean diet, with whole grains, fruits, vegetable, fish, lean meats, nuts, and olive oil. Limit salt. -recommend moderate walking, 3-5 times/week for 30-50 minutes each session. Aim for at least 150 minutes.week. Goal should be pace of 3 miles/hours, or walking 1.5 miles in 30 minutes -recommend avoidance of tobacco products. Avoid excess alcohol. -Additional risk factor control:  -Diabetes: A1c is not available  -Lipids: LDL 126, HDL 34, Tchol 178, TG 93 (notes that this was before lovastatin)  -Blood pressure control:at goal today  -Weight: BMI 20 -ASCVD risk score: not technically applicable given he is secondary  prevention, but does support that he is high risk The 10-year ASCVD risk score Mikey Bussing DC Jr., et al., 2013) is: 18%   Values used to calculate the score:     Age: 58 years     Sex: Male     Is Non-Hispanic African American: No     Diabetic: No     Tobacco smoker: Yes     Systolic Blood Pressure: 607 mmHg     Is BP treated: No     HDL Cholesterol: 34.3 mg/dL     Total Cholesterol: 179 mg/dL   Plan for follow up: as needed  Medication Adjustments/Labs and Tests Ordered: Current medicines are reviewed at length with the patient today.  Concerns regarding medicines are outlined above.  Orders Placed This Encounter  Procedures  . EKG 12-Lead   No orders of the defined types were placed in this encounter.   Patient Instructions  Medication Instructions:  Your Physician recommend you continue on your current medication as directed.    If you need a refill on your cardiac medications before your next appointment, please call your pharmacy.   Lab work: None  Testing/Procedures: none  Follow-Up: At [facility], you and your health needs are our priority.  As part of our continuing mission to provide you with exceptional heart care, we have created designated Provider Care Teams.  These Care Teams include your primary Cardiologist (physician) and Advanced Practice Providers (APPs -  Physician Assistants and Nurse Practitioners) who all work together to provide you with the care you need, when you need it. You will need a follow up appointment as needed.  Please call our office 2 months in advance to schedule this appointment.  You may see Mario Dresser, MD or one of the following Advanced Practice Providers on your designated Care Team:   Rosaria Ferries, PA-C . Jory Sims, DNP, ANP  Any Other Special Instructions Will Be Listed Below (If Applicable).      Signed, Mario Dresser, MD PhD 11/29/2017 4:31 PM    [facility]

## 2017-12-19 ENCOUNTER — Ambulatory Visit (INDEPENDENT_AMBULATORY_CARE_PROVIDER_SITE_OTHER): Payer: Medicare Other

## 2017-12-19 DIAGNOSIS — Z23 Encounter for immunization: Secondary | ICD-10-CM | POA: Diagnosis not present

## 2017-12-26 ENCOUNTER — Other Ambulatory Visit: Payer: Self-pay

## 2017-12-26 NOTE — Telephone Encounter (Signed)
Received fax from [facility] stating pt informed them he is only taking 1/2 tab of lovastatin 40 mg. If he is, pharmacy is requesting a new rx with new directions.  Left message on vm per dpr asking pt to call back to confirm pt is taking 1/2 tab of lovastatin and not as prescribed, 1 tab daily at bedtime.

## 2017-12-30 ENCOUNTER — Telehealth: Payer: Self-pay | Admitting: *Deleted

## 2017-12-30 NOTE — Telephone Encounter (Signed)
Patient called our answering service and cancelled his appt to see Dr. Scot Dock and to set up his Right axillobifemoral bypass surgery. He has been cleared by cardiology for this surgery. I will put his surgery scheduling information in Dr. Nicole Cella pending file.

## 2018-01-01 ENCOUNTER — Ambulatory Visit: Payer: Medicare Other | Admitting: Vascular Surgery

## 2018-01-01 ENCOUNTER — Encounter (HOSPITAL_COMMUNITY): Payer: Medicare Other

## 2018-01-02 ENCOUNTER — Telehealth: Payer: Self-pay

## 2018-01-02 ENCOUNTER — Inpatient Hospital Stay: Payer: Medicare Other | Attending: Hematology

## 2018-01-02 ENCOUNTER — Inpatient Hospital Stay (HOSPITAL_BASED_OUTPATIENT_CLINIC_OR_DEPARTMENT_OTHER): Payer: Medicare Other | Admitting: Hematology

## 2018-01-02 ENCOUNTER — Encounter: Payer: Self-pay | Admitting: Hematology

## 2018-01-02 VITALS — BP 114/74 | HR 65 | Temp 97.9°F | Resp 18 | Ht 65.0 in | Wt 123.6 lb

## 2018-01-02 DIAGNOSIS — Z7982 Long term (current) use of aspirin: Secondary | ICD-10-CM | POA: Diagnosis not present

## 2018-01-02 DIAGNOSIS — C772 Secondary and unspecified malignant neoplasm of intra-abdominal lymph nodes: Secondary | ICD-10-CM

## 2018-01-02 DIAGNOSIS — F17210 Nicotine dependence, cigarettes, uncomplicated: Secondary | ICD-10-CM | POA: Insufficient documentation

## 2018-01-02 DIAGNOSIS — I739 Peripheral vascular disease, unspecified: Secondary | ICD-10-CM

## 2018-01-02 DIAGNOSIS — Z87442 Personal history of urinary calculi: Secondary | ICD-10-CM

## 2018-01-02 DIAGNOSIS — Z9049 Acquired absence of other specified parts of digestive tract: Secondary | ICD-10-CM | POA: Insufficient documentation

## 2018-01-02 DIAGNOSIS — Z79899 Other long term (current) drug therapy: Secondary | ICD-10-CM

## 2018-01-02 DIAGNOSIS — C187 Malignant neoplasm of sigmoid colon: Secondary | ICD-10-CM | POA: Insufficient documentation

## 2018-01-02 DIAGNOSIS — N400 Enlarged prostate without lower urinary tract symptoms: Secondary | ICD-10-CM | POA: Insufficient documentation

## 2018-01-02 DIAGNOSIS — Z9221 Personal history of antineoplastic chemotherapy: Secondary | ICD-10-CM

## 2018-01-02 LAB — COMPREHENSIVE METABOLIC PANEL
ALK PHOS: 79 U/L (ref 38–126)
ALT: 16 U/L (ref 0–44)
ANION GAP: 10 (ref 5–15)
AST: 19 U/L (ref 15–41)
Albumin: 3.6 g/dL (ref 3.5–5.0)
BUN: 13 mg/dL (ref 8–23)
CALCIUM: 8.8 mg/dL — AB (ref 8.9–10.3)
CO2: 24 mmol/L (ref 22–32)
CREATININE: 0.91 mg/dL (ref 0.61–1.24)
Chloride: 105 mmol/L (ref 98–111)
GFR calc non Af Amer: >60 mL/min (ref >60)
Glucose, Bld: 160 mg/dL — ABNORMAL HIGH (ref 70–99)
Potassium: 4.2 mmol/L (ref 3.5–5.1)
Sodium: 139 mmol/L (ref 135–145)
TOTAL PROTEIN: 6.4 g/dL — AB (ref 6.5–8.1)
Total Bilirubin: <0.2 mg/dL — ABNORMAL LOW (ref 0.3–1.2)

## 2018-01-02 LAB — CBC WITH DIFFERENTIAL/PLATELET
Abs Immature Granulocytes: 0.02 10*3/uL (ref 0.00–0.07)
BASOS ABS: 0.1 10*3/uL (ref 0.0–0.1)
BASOS PCT: 1 %
EOS ABS: 0.2 10*3/uL (ref 0.0–0.5)
EOS PCT: 2 %
HEMATOCRIT: 41.5 % (ref 39.0–52.0)
Hemoglobin: 13.9 g/dL (ref 13.0–17.0)
Immature Granulocytes: 0 %
LYMPHS ABS: 3 10*3/uL (ref 0.7–4.0)
Lymphocytes Relative: 39 %
MCH: 31.9 pg (ref 26.0–34.0)
MCHC: 33.5 g/dL (ref 30.0–36.0)
MCV: 95.2 fL (ref 80.0–100.0)
Monocytes Absolute: 0.6 10*3/uL (ref 0.1–1.0)
Monocytes Relative: 8 %
NRBC: 0 % (ref 0.0–0.2)
Neutro Abs: 3.8 10*3/uL (ref 1.7–7.7)
Neutrophils Relative %: 50 %
Platelets: 163 10*3/uL (ref 150–400)
RBC: 4.36 MIL/uL (ref 4.22–5.81)
RDW: 14.3 % (ref 11.5–15.5)
WBC: 7.6 10*3/uL (ref 4.0–10.5)

## 2018-01-02 LAB — CEA (IN HOUSE-CHCC): CEA (CHCC-In House): 4.06 ng/mL (ref 0.00–5.00)

## 2018-01-02 NOTE — Telephone Encounter (Signed)
Printed avs and calender of upcoming appointment. Per 12/19 los 

## 2018-01-02 NOTE — Progress Notes (Signed)
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 Resected and retrieved. - Rule out malignancy, tumor in the sigmoid colon. Biopsied. Tattooed. - Internal hemorrhoids. - The examination was otherwise normal on direct and retroflexion views.     03/14/2016 Imaging    CT A/P W CONTRAST  IMPRESSION: Short segment area of wall thickening in the distal sigmoid colon, suspicious for site of primary colon carcinoma. Recommend correlation with colonoscopy results.    04/23/2016 Imaging    CT Chest W Contrast IMPRESSION: 1. No evidence of pulmonary metastasis. 2. Bilateral calcified pulmonary nodules and mediastinal lymph nodes consistent with granulomatous disease. 3. Centrilobular emphysema. 4. Coronary artery calcification and aortic atherosclerotic calcification.    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 Colon, segmental resection for  tumor, sigmoid - INVASIVE ADENOCARCINOMA, WELL TO MODERATELY DIFFERENTIATED, WITH ABUNDANT MUCIN (PT3 PN1) - METASTATIC ADENOCARCINOMA IN ONE OF TWENTY-TWO LYMPH NODES (1/22) - TWO TUMOR DEPOSITS PRESENT - MARGINS UNINVOLVED BY CARCINOMA - HYPERPLASTIC POLYPS (X2) - SEE ONCOLOGY TABLE BELOW 2. Colon, resection margin (donut), final distal margin - BENIGN COLON - NO MALIGNANCY IDENTIFIED    05/21/2016 Surgery    LAPAROSCOPIC PARTIAL COLECTOMY, SPLENIC FLEXURE MOBILIZATION AND PROCTOSCOPY    06/25/2016 - 09/03/2016 Chemotherapy    FOLFOX every 2 weeks for 3 months     12/03/2016 Imaging    CT AP 12/03/16 IMPRESSION: Status post sigmoid resection. No evidence of recurrent or metastatic disease.    05/29/2017 Procedure    05/29/2017 Colonoscopy - One 7 mm polyp in the descending colon, removed with a cold snare. Resected and retrieved. - One 4 mm polyp in the transverse colon, removed with a cold biopsy forceps. Resected and retrieved. - Patent end-to-end colo-colonic anastomosis, characterized by healthy appearing mucosa. - Internal hemorrhoids. - The examination was otherwise normal on direct and retroflexion views.    05/29/2017 Pathology Results    05/29/2017 Surgical Pathology Diagnosis 1. Surgical [P], random sites - BENIGN COLONIC MUCOSA. - NO ACTIVE INFLAMMATION OR EVIDENCE OF MICROSCOPIC COLITIS. - NO DYSPLASIA OR MALIGNANCY. 2. Surgical [P], descending and transverse, polyp (2) - HYPERPLASTIC POLYP (X2 FRAGMENTS). - NO DYSPLASIA OR MALIGNANCY.    08/29/2017 Imaging    08/29/2017 CT CAP W Contrast IMPRESSION: 1. No findings of active malignancy. 2. 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 Chart review indicates that the patient was seen for this by Dr. Gae Gallop on 01/02/2017.  3. 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 4. Other imaging findings of potential clinical significance:  Aortic Atherosclerosis (ICD10-I70.0) and Emphysema (ICD10-J43.9). Coronary atherosclerosis. 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 Small right scrotal hydrocele      CURRENT THERAPY:  Surveillance   INTERVAL HISTORY:  Mario Mcdowell is here for a follow up of colorectal cancer. He presents to the clinic today by himself. He notes he is doing well overall. He denies abdominal issues and notes normal appetite and BMs. He notes he was able to gain weight. He still has buttock pain when he ambulates. He would need rest to relieve pain. He notes thinking about extensive vascular surgery to help or resolved this. He is worried about possible complications or infections from surgery. He also notes decreased blockage in his neck blood vessels. He notes he still smokes.     REVIEW OF SYSTEMS:   Constitutional: Denies fevers, chills or abnormal weight loss (+) weight gain  Eyes: Denies blurriness of vision Ears, nose, mouth, throat, and face: Denies mucositis or sore throat Respiratory: Denies cough, dyspnea or wheezes Cardiovascular: Denies palpitation, chest discomfort or lower extremity swelling MKS: (+) Pain around buttock when ambulating  Gastrointestinal:  Denies nausea, heartburn or change in bowel habits Skin: Denies abnormal skin rashes Lymphatics: Denies new lymphadenopathy or easy bruising Neurological:Denies numbness, tingling or new weaknesses Behavioral/Psych: Mood is stable, no new changes  All other systems were reviewed with the patient and are negative.  MEDICAL HISTORY:  Past Medical History:  Diagnosis Date  . Aorto-iliac atherosclerosis ([place])    by CT; in bilateral legs   . Arthritis   . Atherosclerotic PVD with intermittent claudication ([place])   . BPH without obstruction/lower urinary tract symptoms 10/25/2015  . Centrilobular emphysema ([place])    per chest CT 04-23-2016  . Colon adenocarcinoma [facility]) oncologist- dr Burr Medico   dx 05-21-2016 --  Stage IIIB (pT3,pN1,M0) , Invasive sigmoid adenocarcinoma well to moderately differentiated with metastatic intra-abdominal lymph node (1 out of 22), s/p lap. partial colectomy [name]) 05/21/2016 and chemotherapy is planned  . Full dentures   . History of kidney stones 07/2015   passed without intervention  . Pulmonary nodules    per chest CT 04-23-2016 bilateral nodules (consistent w/ granulomatous disease)  . Smokers' cough ([place])   . Wears contact lenses     SURGICAL HISTORY: Past Surgical History:  Procedure Laterality Date  . COLONOSCOPY  03/12/2016   4 polyps, sigmoid tumor biopsied (TVA w/ MICROSCOPIC FOCUS SUSPICIOUS FOR INVASION) pending surgical resection, rpt 1 yr [facility])  . COLONOSCOPY  05/2017   2 benign polyps, rpt 2 yrs [facility])  . LAPAROSCOPIC PARTIAL COLECTOMY N/A 05/21/2016   colon cancer, 1/20 positive lymp nodes. LAPAROSCOPIC PARTIAL COLECTOMY, SPLENIC FLEXURE MOBILIZATION AND PROCTOSCOPY;  [name], MD)  . MULTIPLE TOOTH EXTRACTIONS  1980's  . PORT-A-CATH REMOVAL Right 10/04/2016   Procedure: REMOVAL PORT-A-CATH;  Surgeon: [name], MD;  Location: [facility];  Service: General;  Laterality: Right;  . PORTACATH PLACEMENT N/A 06/21/2016   Procedure: INSERTION PORT-A-CATH;  Surgeon: [name], MD;  Location[facility];  Service: General;  Laterality: N/A;  right    I have reviewed the social history and family history with the patient and they are unchanged from previous note.  ALLERGIES:  is allergic to penicillins and lipitor [atorvastatin].  MEDICATIONS:  Current Outpatient Medications  Medication Sig Dispense Refill  . aspirin EC 81 MG tablet Take 81 mg  by mouth daily.    Marland Kitchen lovastatin (MEVACOR) 40 MG tablet Take 1 tablet (40 mg total) by mouth at bedtime. 30 tablet 6   Current Facility-Administered Medications  Medication Dose Route Frequency Provider Last Rate Last Dose  . 0.9 %  sodium chloride infusion  500 mL  Intravenous Once Ladene Artist, MD        PHYSICAL EXAMINATION: ECOG PERFORMANCE STATUS: 1 - Symptomatic but completely ambulatory  Vitals:   01/02/18 1251  BP: 114/74  Pulse: 65  Resp: 18  Temp: 97.9 F (36.6 C)  SpO2: 99%   Filed Weights   01/02/18 1251  Weight: 123 lb 9.6 oz (56.1 kg)    GENERAL:alert, no distress and comfortable SKIN: skin color, texture, turgor are normal, no rashes or significant lesions EYES: normal, Conjunctiva are pink and non-injected, sclera clear OROPHARYNX:no exudate, no erythema and lips, buccal mucosa, and tongue normal  NECK: supple, thyroid normal size, non-tender, without nodularity LYMPH:  no palpable lymphadenopathy in the cervical, axillary or inguinal LUNGS: clear to auscultation and percussion with normal breathing effort HEART: regular rate & rhythm and no murmurs and no lower extremity edema ABDOMEN:abdomen soft, non-tender and normal bowel sounds Musculoskeletal:no cyanosis of digits and no clubbing  NEURO: alert & oriented x 3 with fluent speech, no focal motor/sensory deficits  LABORATORY DATA:  I have reviewed the data as listed CBC Latest Ref Rng & Units 01/02/2018 08/29/2017 06/03/2017  WBC 4.0 - 10.5 K/uL 7.6 8.1 9.3  Hemoglobin 13.0 - 17.0 g/dL 13.9 15.7 14.0  Hematocrit 39.0 - 52.0 % 41.5 46.1 40.8  Platelets 150 - 400 K/uL 163 161 172     CMP Latest Ref Rng & Units 01/02/2018 08/29/2017 06/03/2017  Glucose 70 - 99 mg/dL 160(H) 83 80  BUN 8 - 23 mg/dL '13 15 16  '$ Creatinine 0.61 - 1.24 mg/dL 0.91 0.98 0.88  Sodium 135 - 145 mmol/L 139 141 136  Potassium 3.5 - 5.1 mmol/L 4.2 4.6 3.9  Chloride 98 - 111 mmol/L 105 103 104  CO2 22 - 32 mmol/L '24 27 25  '$ Calcium 8.9 - 10.3 mg/dL 8.8(L) 9.7 8.9  Total Protein 6.5 - 8.1 g/dL 6.4(L) 7.5 6.8  Total Bilirubin 0.3 - 1.2 mg/dL <0.2(L) 0.3 <0.2(L)  Alkaline Phos 38 - 126 U/L 79 86 91  AST 15 - 41 U/L '19 22 20  '$ ALT 0 - 44 U/L '16 23 21      '$ RADIOGRAPHIC STUDIES: I have  personally reviewed the radiological images as listed and agreed with the findings in the report. No results found.   ASSESSMENT & PLAN:  Keishon Chavarin is a 67 y.o. male with    1. 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 He is s/p hemicolectomy and treated with adjuvant FOLFOX.  He is on surveillance now. -He is clinically doing well. Labs reviewed, CBC is stable, CMP and CEA is still pending.  -He is almost 2 year since diagnosis. After 2 years his risk of recurrence drops significantly.  -Clinically doing well, asymptomatic except mild claudication.  Exam was unremarkable, no clinical concern for recurrence. -If he would like to proceed with vascular surgery, he is cleared as his risk of infection has decreased.  -Continue surveillance. Next scan in 08/2018.  -F/u in 4 months   2. Smoking cessation  -He previously declined Nicotine patches or counseling to help him quit. -He is still smoking 1 pack or less  a day. He has been trying to quit, but unsuccessful. -I again discussed smoking causes blockage of his blood vessels and stroke.    3. Emphysema  4. BPH -01/09/17 PSA normal at 0.55 -Will continue to be followed by his PCP.   5. History of kidney stones  6. PAD -Prior CT scan showed complete occlusion of his abdominal aorta. He is reluctant to proceed with surgery. His risk from infection s/p chemo is lower now. He is cleared to proceed.  -He will contact his vascular surgeon Dr. Doren Custard when he is ready to proceed  PLAN -He is clinically doing well with no concern for recurrence. Continue surveillance.    -F/u and labs in 4 months, will order CT scan on next visit.     No problem-specific Assessment & Plan notes found for this encounter.   No orders of the defined types were placed in this encounter.  All questions were answered. The patient knows to call the clinic with any problems,  questions or concerns. No barriers to learning was detected. I spent 15 minutes counseling the patient face to face. The total time spent in the appointment was 20 minutes and more than 50% was on counseling and review of test results     Truitt Merle, MD 01/02/2018   I, Joslyn Devon, am acting as scribe for Truitt Merle, MD.   I have reviewed the above documentation for accuracy and completeness, and I agree with the above.

## 2018-01-06 ENCOUNTER — Telehealth: Payer: Self-pay

## 2018-01-06 NOTE — Telephone Encounter (Signed)
Spoke with patient per Dr. Burr Medico notified him CEA was normal at this time, no concerns.  Patient verbalized an understanding

## 2018-01-06 NOTE — Telephone Encounter (Signed)
-----   Message from Truitt Merle, MD sent at 01/05/2018  1:06 PM EST ----- Please let pt know that his CEA was normal this time, no concerns, thanks   Truitt Merle 01/05/2018

## 2018-01-07 ENCOUNTER — Other Ambulatory Visit: Payer: Self-pay | Admitting: Family Medicine

## 2018-01-07 DIAGNOSIS — C187 Malignant neoplasm of sigmoid colon: Secondary | ICD-10-CM

## 2018-01-07 DIAGNOSIS — N400 Enlarged prostate without lower urinary tract symptoms: Secondary | ICD-10-CM

## 2018-01-07 DIAGNOSIS — C772 Secondary and unspecified malignant neoplasm of intra-abdominal lymph nodes: Secondary | ICD-10-CM

## 2018-01-07 DIAGNOSIS — R739 Hyperglycemia, unspecified: Secondary | ICD-10-CM

## 2018-01-07 DIAGNOSIS — E7800 Pure hypercholesterolemia, unspecified: Secondary | ICD-10-CM

## 2018-01-10 ENCOUNTER — Ambulatory Visit: Payer: Medicare Other

## 2018-01-10 ENCOUNTER — Ambulatory Visit (INDEPENDENT_AMBULATORY_CARE_PROVIDER_SITE_OTHER): Payer: Medicare Other

## 2018-01-10 VITALS — BP 122/70 | HR 70 | Temp 97.3°F | Ht 64.5 in | Wt 122.8 lb

## 2018-01-10 DIAGNOSIS — N400 Enlarged prostate without lower urinary tract symptoms: Secondary | ICD-10-CM | POA: Diagnosis not present

## 2018-01-10 DIAGNOSIS — Z0000 Encounter for general adult medical examination without abnormal findings: Secondary | ICD-10-CM

## 2018-01-10 DIAGNOSIS — E7800 Pure hypercholesterolemia, unspecified: Secondary | ICD-10-CM | POA: Diagnosis not present

## 2018-01-10 DIAGNOSIS — R739 Hyperglycemia, unspecified: Secondary | ICD-10-CM

## 2018-01-10 LAB — LIPID PANEL
CHOL/HDL RATIO: 5
CHOLESTEROL: 161 mg/dL (ref 0–200)
HDL: 34.1 mg/dL — ABNORMAL LOW (ref >39.00)
LDL Cholesterol: 106 mg/dL — ABNORMAL HIGH (ref 0–99)
NonHDL: 126.46
TRIGLYCERIDES: 103 mg/dL (ref 0.0–149.0)
VLDL: 20.6 mg/dL (ref 0.0–40.0)

## 2018-01-10 LAB — HEMOGLOBIN A1C: HEMOGLOBIN A1C: 6.2 % (ref 4.6–6.5)

## 2018-01-10 LAB — PSA: PSA: 0.84 ng/mL (ref 0.10–4.00)

## 2018-01-10 NOTE — Patient Instructions (Signed)
Mr. Kielty , Thank you for taking time to come for your Medicare Wellness Visit. I appreciate your ongoing commitment to your health goals. Please review the following plan we discussed and let me know if I can assist you in the future.   These are the goals we discussed: Goals    . Follow up with Primary Care Provider     Starting 01/10/2018, I will continue to take medications as prescribed and to keep appointments as scheduled with PCP and other specialists.     . Quit Smoking       This is a list of the screening recommended for you and due dates:  Health Maintenance  Topic Date Due  . DTaP/Tdap/Td vaccine (1 - Tdap) 01/15/2019*  . Tetanus Vaccine  01/15/2019*  . Pneumonia vaccines (1 of 2 - PCV13) 01/15/2019*  . Colon Cancer Screening  05/30/2019  . Flu Shot  Completed  .  Hepatitis C: One time screening is recommended by Center for Disease Control  (CDC) for  adults born from [birth year] through 1965.   Completed  *Topic was postponed. The date shown is not the original due date.   Preventive Care for Adults  A healthy lifestyle and preventive care can promote health and wellness. Preventive health guidelines for adults include the following key practices.  . A routine yearly physical is a good way to check with your health care provider about your health and preventive screening. It is a chance to share any concerns and updates on your health and to receive a thorough exam.  . Visit your dentist for a routine exam and preventive care every 6 months. Brush your teeth twice a day and floss once a day. Good oral hygiene prevents tooth decay and gum disease.  . The frequency of eye exams is based on your age, health, family medical history, use  of contact lenses, and other factors. Follow your health care provider's recommendations for frequency of eye exams.  . Eat a healthy diet. Foods like vegetables, fruits, whole grains, low-fat dairy products, and lean protein foods contain the  nutrients you need without too many calories. Decrease your intake of foods high in solid fats, added sugars, and salt. Eat the right amount of calories for you. Get information about a proper diet from your health care provider, if necessary.  . Regular physical exercise is one of the most important things you can do for your health. Most adults should get at least 150 minutes of moderate-intensity exercise (any activity that increases your heart rate and causes you to sweat) each week. In addition, most adults need muscle-strengthening exercises on 2 or more days a week.  Silver Sneakers may be a benefit available to you. To determine eligibility, you may visit the website: www.silversneakers.com or contact program at 4348626573 Mon-Fri between 8AM-8PM.   . Maintain a healthy weight. The body mass index (BMI) is a screening tool to identify possible weight problems. It provides an estimate of body fat based on height and weight. Your health care provider can find your BMI and can help you achieve or maintain a healthy weight.   For adults 20 years and older: ? A BMI below 18.5 is considered underweight. ? A BMI of 18.5 to 24.9 is normal. ? A BMI of 25 to 29.9 is considered overweight. ? A BMI of 30 and above is considered obese.   . Maintain normal blood lipids and cholesterol levels by exercising and minimizing your intake of saturated fat.  Eat a balanced diet with plenty of fruit and vegetables. Blood tests for lipids and cholesterol should begin at age 33 and be repeated every 5 years. If your lipid or cholesterol levels are high, you are over 50, or you are at high risk for heart disease, you may need your cholesterol levels checked more frequently. Ongoing high lipid and cholesterol levels should be treated with medicines if diet and exercise are not working.  . If you smoke, find out from your health care provider how to quit. If you do not use tobacco, please do not start.  . If you  choose to drink alcohol, please do not consume more than 2 drinks per day. One drink is considered to be 12 ounces (355 mL) of beer, 5 ounces (148 mL) of wine, or 1.5 ounces (44 mL) of liquor.  . If you are 59-82 years old, ask your health care provider if you should take aspirin to prevent strokes.  . Use sunscreen. Apply sunscreen liberally and repeatedly throughout the day. You should seek shade when your shadow is shorter than you. Protect yourself by wearing long sleeves, pants, a wide-brimmed hat, and sunglasses year round, whenever you are outdoors.  . Once a month, do a whole body skin exam, using a mirror to look at the skin on your back. Tell your health care provider of new moles, moles that have irregular borders, moles that are larger than a pencil eraser, or moles that have changed in shape or color.

## 2018-01-10 NOTE — Progress Notes (Signed)
PCP notes:   Health maintenance:  Tetanus vaccine - postponed/insurance PNA vaccine - pt declined  Abnormal screenings:   Hearing - failed  Hearing Screening   125Hz  250Hz  500Hz  1000Hz  2000Hz  3000Hz  4000Hz  6000Hz  8000Hz   Right ear:   40 40 40  0    Left ear:   40 40 40  0     Mini-Cog score: 19/20 MMSE - Mini Mental State Exam 01/10/2018 01/09/2017  Orientation to time 5 5  Orientation to Place 5 5  Registration 3 3  Attention/ Calculation 0 0  Recall 2 3  Recall-comments unable to recall 1 of 3 words -  Language- name 2 objects 0 0  Language- repeat 1 1  Language- follow 3 step command 3 3  Language- read & follow direction 0 0  Write a sentence 0 0  Copy design 0 0  Total score 19 20     Patient concerns:   None  Nurse concerns:  None  Next PCP appt:   01/13/2018 @ 1030

## 2018-01-10 NOTE — Progress Notes (Signed)
Subjective:   Mario Mcdowell is a 67 y.o. male who presents for Medicare Annual/Subsequent preventive examination.  Review of Systems:  N/A Cardiac Risk Factors include: advanced age (>77men, >56 women);male gender     Objective:    Vitals: BP 122/70 (BP Location: Right Arm, Patient Position: Sitting, Cuff Size: Normal)   Pulse 70   Temp (!) 97.3 F (36.3 C) (Oral)   Ht 5' 4.5" (1.638 m) Comment: shoes  Wt 122 lb 12 oz (55.7 kg)   SpO2 98%   BMI 20.74 kg/m   Body mass index is 20.74 kg/m.  Advanced Directives 01/10/2018 11/27/2017 09/02/2017 07/10/2017 05/29/2017 01/09/2017 12/05/2016  Does Patient Have a Medical Advance Directive? No No No No No No No  Would patient like information on creating a medical advance directive? No - Patient declined - - No - Patient declined - No - Patient declined -    Tobacco Social History   Tobacco Use  Smoking Status Current Every Day Smoker  . Packs/day: 1.00  . Years: 46.00  . Pack years: 46.00  . Types: Cigarettes  Smokeless Tobacco Never Used     Ready to quit: No Counseling given: No   Clinical Intake:  Pre-visit preparation completed: Yes  Pain : No/denies pain Pain Score: 0-No pain     Nutritional Status: BMI of 19-24  Normal Nutritional Risks: None Diabetes: No  How often do you need to have someone help you when you read instructions, pamphlets, or other written materials from your doctor or pharmacy?: 1 - Never What is the last grade level you completed in school?: 7th grade  Interpreter Needed?: No  Comments: pt lives with spouse Information entered by :: LPinson, LPN  Past Medical History:  Diagnosis Date  . Aorto-iliac atherosclerosis ([place])    by CT; in bilateral legs   . Arthritis   . Atherosclerotic PVD with intermittent claudication ([place])   . BPH without obstruction/lower urinary tract symptoms 10/25/2015  . Centrilobular emphysema ([place])    per chest CT 04-23-2016  . Colon adenocarcinoma [facility])  oncologist- dr Burr Medico   dx 05-21-2016 -- Stage IIIB (pT3,pN1,M0) , Invasive sigmoid adenocarcinoma well to moderately differentiated with metastatic intra-abdominal lymph node (1 out of 22), s/p lap. partial colectomy [name]) 05/21/2016 and chemotherapy is planned  . Full dentures   . History of kidney stones 07/2015   passed without intervention  . Pulmonary nodules    per chest CT 04-23-2016 bilateral nodules (consistent w/ granulomatous disease)  . Smokers' cough ([place])   . Wears contact lenses    Past Surgical History:  Procedure Laterality Date  . COLONOSCOPY  03/12/2016   4 polyps, sigmoid tumor biopsied (TVA w/ MICROSCOPIC FOCUS SUSPICIOUS FOR INVASION) pending surgical resection, rpt 1 yr [facility])  . COLONOSCOPY  05/2017   2 benign polyps, rpt 2 yrs [facility])  . LAPAROSCOPIC PARTIAL COLECTOMY N/A 05/21/2016   colon cancer, 1/20 positive lymp nodes. LAPAROSCOPIC PARTIAL COLECTOMY, SPLENIC FLEXURE MOBILIZATION AND PROCTOSCOPY;  [name], MD)  . MULTIPLE TOOTH EXTRACTIONS  1980's  . PORT-A-CATH REMOVAL Right 10/04/2016   Procedure: REMOVAL PORT-A-CATH;  Surgeon: [name], MD;  Location: [facility];  Service: General;  Laterality: Right;  . PORTACATH PLACEMENT N/A 06/21/2016   Procedure: INSERTION PORT-A-CATH;  Surgeon: [name], MD;  Location[facility];  Service: General;  Laterality: N/A;  right   Family History  Problem Relation Age of Onset  . Cancer Paternal Aunt  unsure details  . Diabetes Cousin   . Cancer Paternal Uncle        unknow type cancer   . Prostate cancer Neg Hx   . Kidney cancer Neg Hx   . CAD Neg Hx   . Stroke Neg Hx   . Colon cancer Neg Hx    Social History   Socioeconomic History  . Marital status: Married    Spouse name: Not on file  . Number of children: Not on file  . Years of education: Not on file  . Highest education level: Not on file  Occupational History  . Not on file  Social Needs  .  Financial resource strain: Not on file  . Food insecurity:    Worry: Not on file    Inability: Not on file  . Transportation needs:    Medical: Not on file    Non-medical: Not on file  Tobacco Use  . Smoking status: Current Every Day Smoker    Packs/day: 1.00    Years: 46.00    Pack years: 46.00    Types: Cigarettes  . Smokeless tobacco: Never Used  Substance and Sexual Activity  . Alcohol use: No    Comment: quit 2000  . Drug use: No  . Sexual activity: Not Currently    Birth control/protection: Abstinence  Lifestyle  . Physical activity:    Days per week: Not on file    Minutes per session: Not on file  . Stress: Not on file  Relationships  . Social connections:    Talks on phone: Not on file    Gets together: Not on file    Attends religious service: Not on file    Active member of club or organization: Not on file    Attends meetings of clubs or organizations: Not on file    Relationship status: Not on file  Other Topics Concern  . Not on file  Social History Narrative   Lives with wife, adopted daughter, adopted son   Occ: carpenter   Edu: 8th grade     Outpatient Encounter Medications as of 01/10/2018  Medication Sig  . aspirin EC 81 MG tablet Take 81 mg by mouth daily.  Marland Kitchen lovastatin (MEVACOR) 40 MG tablet Take 1 tablet (40 mg total) by mouth at bedtime.   Facility-Administered Encounter Medications as of 01/10/2018  Medication  . 0.9 %  sodium chloride infusion    Activities of Daily Living In your present state of health, do you have any difficulty performing the following activities: 01/10/2018  Hearing? N  Vision? N  Difficulty concentrating or making decisions? N  Walking or climbing stairs? N  Dressing or bathing? N  Doing errands, shopping? N  Preparing Food and eating ? N  Using the Toilet? N  In the past six months, have you accidently leaked urine? N  Do you have problems with loss of bowel control? N  Managing your Medications? N    Managing your Finances? N  Housekeeping or managing your Housekeeping? N  Some recent data might be hidden    Patient Care Team: Ria Bush, MD as PCP - General (Family Medicine) Buford Dresser, MD as PCP - Cardiology (Cardiology) Ladene Artist, MD as Consulting Physician (Gastroenterology) [name], MD as Consulting Physician (General Surgery) Truitt Merle, MD as Consulting Physician (Hematology)   Assessment:   This is a routine wellness examination for [place].   Hearing Screening   125Hz  250Hz  500Hz  1000Hz  2000Hz  3000Hz  4000Hz  6000Hz   8000Hz   Right ear:   40 40 40  0    Left ear:   40 40 40  0      Visual Acuity Screening   Right eye Left eye Both eyes  Without correction:     With correction: 20/30 20/40 20/25   Comments: Wears contact lens   Exercise Activities and Dietary recommendations Current Exercise Habits: The patient has a physically strenous job, but has no regular exercise apart from work., Exercise limited by: None identified  Goals    . Follow up with Primary Care Provider     Starting 01/09/2017, I will continue to take medications as prescribed and to keep appointments as scheduled with PCP and other specialists.     . Quit Smoking       Fall Risk Fall Risk  01/10/2018 01/09/2017 01/06/2016  Falls in the past year? 0 Yes No  Number falls in past yr: - 1 -  Injury with Fall? - No -   Depression Screen PHQ 2/9 Scores 01/10/2018 01/09/2017 01/06/2016  PHQ - 2 Score 0 0 0  PHQ- 9 Score 0 0 -    Cognitive Function MMSE - Mini Mental State Exam 01/10/2018 01/09/2017  Orientation to time 5 5  Orientation to Place 5 5  Registration 3 3  Attention/ Calculation 0 0  Recall 2 3  Recall-comments unable to recall 1 of 3 words -  Language- name 2 objects 0 0  Language- repeat 1 1  Language- follow 3 step command 3 3  Language- read & follow direction 0 0  Write a sentence 0 0  Copy design 0 0  Total score 19 20     PLEASE NOTE:  A Mini-Cog screen was completed. Maximum score is 20. A value of 0 denotes this part of Folstein MMSE was not completed or the patient failed this part of the Mini-Cog screening.   Mini-Cog Screening Orientation to Time - Max 5 pts Orientation to Place - Max 5 pts Registration - Max 3 pts Recall - Max 3 pts Language Repeat - Max 1 pts Language Follow 3 Step Command - Max 3 pts     Immunization History  Administered Date(s) Administered  . Influenza,inj,Quad PF,6+ Mos 12/05/2016, 12/19/2017    Screening Tests Health Maintenance  Topic Date Due  . DTaP/Tdap/Td (1 - Tdap) 01/15/2019 (Originally 08/16/1961)  . TETANUS/TDAP  01/15/2019 (Originally 08/16/1969)  . PNA vac Low Risk Adult (1 of 2 - PCV13) 01/15/2019 (Originally 08/17/2015)  . COLONOSCOPY  05/30/2019  . INFLUENZA VACCINE  Completed  . Hepatitis C Screening  Completed       Plan:     I have personally reviewed, addressed, and noted the following in the patient's chart:  A. Medical and social history B. Use of alcohol, tobacco or illicit drugs  C. Current medications and supplements D. Functional ability and status E.  Nutritional status F.  Physical activity G. Advance directives H. List of other physicians I.  Hospitalizations, surgeries, and ER visits in previous 12 months J.  Altha to include hearing, vision, cognitive, depression L. Referrals and appointments - none  In addition, I have reviewed and discussed with patient certain preventive protocols, quality metrics, and best practice recommendations. A written personalized care plan for preventive services as well as general preventive health recommendations were provided to patient.  See attached scanned questionnaire for additional information.   Signed,   Lindell Noe, MHA, BS, LPN Health Coach

## 2018-01-12 ENCOUNTER — Encounter: Payer: Self-pay | Admitting: Family Medicine

## 2018-01-12 NOTE — Progress Notes (Signed)
BP 116/64 (BP Location: Left Arm, Patient Position: Sitting, Cuff Size: Normal)   Pulse 72   Temp 97.6 F (36.4 C) (Oral)   Ht 5' 4.5" (1.638 m)   Wt 123 lb (55.8 kg)   SpO2 97%   BMI 20.79 kg/m    CC: CPE Subjective:    Patient ID: Mario Mcdowell, male    DOB: Jun 21, 1950, 67 y.o.   MRN: [id number]  HPI: Mario Mcdowell is a 67 y.o. male presenting on 01/13/2018 for Annual Exam (Pt 2. )    Saw Katha Cabal last week for medicare wellness visit. Note reviewed.    Seeing [facility] for aortoiliac occlusive disease with bilateral claudication. Has been cleared by cardiology to proceed with revascularization. Pt has decided to hold off on this for now.  Stable carotid stenosis bilaterally.   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 Latest onc note 01/02/2018 reviewed.  Prostate cancer screening -PSA reassuring, declines DRE. h/o BPH.  Lung cancer screening -contrasted CT 08/2017 reviewed Flu shot -yearly Pneumonia shot -declines Tetanus shot -declines Shingles shot -declines Advanced directive discussion -discussed. Wife would be HCPOA. Packet at home. Would not want prolonged life support. Would be ok with trial for reversible condition. Encouraged he discuss this with wife.  Seat belt use discussed.  Sunscreen use discussed, no changing moles on skin.  Smoking - 1/2-1 ppd  Alcohol - none (h/o remote use) Dentist - has dentures Eye exam yearly   Lives with wife, adopted daughter, adopted son [id number] Edu: 8th grade Activity: no regular exercise  Diet: no water - causes nausea. Drinks 1 gallon milk/day. Fruits/vegetables daily.     Relevant past medical, surgical, family and social history reviewed and updated as indicated. Interim medical history since our last visit reviewed. Allergies and medications reviewed and  updated. Outpatient Medications Prior to Visit  Medication Sig Dispense Refill  . aspirin EC 81 MG tablet Take 81 mg by mouth daily.    Marland Kitchen lovastatin (MEVACOR) 40 MG tablet Take 1 tablet (40 mg total) by mouth at bedtime. (Patient taking differently: Take 40 mg by mouth at bedtime. Takes 1/2 tablet at bedtime) 30 tablet 6   Facility-Administered Medications Prior to Visit  Medication Dose Route Frequency Provider Last Rate Last Dose  . 0.9 %  sodium chloride infusion  500 mL Intravenous Once Ladene Artist, MD         Per HPI unless specifically indicated in ROS section below Review of Systems  Constitutional: Negative for activity change, appetite change, chills, fatigue, fever and unexpected weight change.  HENT: Negative for hearing loss.   Eyes: Negative for visual disturbance.  Respiratory: Negative for cough, chest tightness, shortness of breath and wheezing.   Cardiovascular: Negative for chest pain, palpitations and leg swelling.  Gastrointestinal: Negative for abdominal distention, abdominal pain, blood in stool, constipation, diarrhea, nausea and vomiting.  Genitourinary: Negative for difficulty urinating and hematuria.  Musculoskeletal: Negative for arthralgias, myalgias and neck pain.       Bilateral buttock pain when walking dog from claudication  Skin: Negative for rash.  Neurological: Negative for dizziness, seizures, syncope and headaches.  Hematological: Negative for adenopathy. Does not bruise/bleed easily.  Psychiatric/Behavioral: Negative for dysphoric mood. The patient is not nervous/anxious.    Objective:    BP 116/64 (BP Location: Left Arm, Patient Position: Sitting, Cuff Size: Normal)   Pulse 72  Temp 97.6 F (36.4 C) (Oral)   Ht 5' 4.5" (1.638 m)   Wt 123 lb (55.8 kg)   SpO2 97%   BMI 20.79 kg/m   Wt Readings from Last 3 Encounters:  01/13/18 123 lb (55.8 kg)  01/10/18 122 lb 12 oz (55.7 kg)  01/02/18 123 lb 9.6 oz (56.1 kg)    Physical  Exam Vitals signs and nursing note reviewed.  Constitutional:      General: He is not in acute distress.    Appearance: He is well-developed.  HENT:     Head: Normocephalic and atraumatic.     Right Ear: Hearing, tympanic membrane, ear canal and external ear normal.     Left Ear: Hearing, tympanic membrane, ear canal and external ear normal.     Nose: Nose normal.     Mouth/Throat:     Pharynx: Uvula midline. Posterior oropharyngeal erythema present. No oropharyngeal exudate.  Eyes:     General: No scleral icterus.    Conjunctiva/sclera: Conjunctivae normal.     Pupils: Pupils are equal, round, and reactive to light.  Neck:     Musculoskeletal: Normal range of motion and neck supple.     Vascular: Carotid bruit (L>R) present.  Cardiovascular:     Rate and Rhythm: Normal rate and regular rhythm.     Pulses:          Radial pulses are 2+ on the right side and 2+ on the left side.     Heart sounds: Normal heart sounds. No murmur.  Pulmonary:     Effort: Pulmonary effort is normal. No respiratory distress.     Breath sounds: Normal breath sounds. No wheezing, rhonchi or rales.     Comments: Coarse breath sounds Abdominal:     General: Bowel sounds are normal. There is no distension.     Palpations: Abdomen is soft. There is no mass.     Tenderness: There is no abdominal tenderness. There is no guarding or rebound.  Musculoskeletal: Normal range of motion.  Lymphadenopathy:     Cervical: No cervical adenopathy.  Skin:    General: Skin is warm and dry.     Findings: No rash.  Neurological:     Mental Status: He is alert and oriented to person, place, and time.     Comments: CN grossly intact, station and gait intact  Psychiatric:        Behavior: Behavior normal.        Thought Content: Thought content normal.        Judgment: Judgment normal.       Results for orders placed or performed in visit on 01/10/18  PSA  Result Value Ref Range   PSA 0.84 0.10 - 4.00 ng/mL   Hemoglobin A1c  Result Value Ref Range   Hgb A1c MFr Bld 6.2 4.6 - 6.5 %  Lipid panel  Result Value Ref Range   Cholesterol 161 0 - 200 mg/dL   Triglycerides 103.0 0.0 - 149.0 mg/dL   HDL 34.10 (L) >39.00 mg/dL   VLDL 20.6 0.0 - 40.0 mg/dL   LDL Cholesterol 106 (H) 0 - 99 mg/dL   Total CHOL/HDL Ratio 5    NonHDL 126.46    Assessment & Plan:   Problem List Items Addressed This Visit    Smoker    Strongly encouraged cessation. Precontemplative. Gets regular CT scans through onc.       Prediabetes    Encouraged avoiding added sugars in diet.  HLD (hyperlipidemia)    Chronic, LDL above goal in PAD hx. Will change lovastatin to crestor 5mg  daily to QOD as tolerated. Update with effect. The 10-year ASCVD risk score Mikey Bussing DC Brooke Bonito., et al., 2013) is: 17.4%   Values used to calculate the score:     Age: 35 years     Sex: Male     Is Non-Hispanic African American: No     Diabetic: No     Tobacco smoker: Yes     Systolic Blood Pressure: 921 mmHg     Is BP treated: No     HDL Cholesterol: 34.1 mg/dL     Total Cholesterol: 161 mg/dL       Relevant Medications   rosuvastatin (CRESTOR) 5 MG tablet   Health maintenance examination - Primary    Preventative protocols reviewed and updated unless pt declined. Discussed healthy diet and lifestyle.       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 Regularly followed by Dr Burr Medico.       BPH without obstruction/lower urinary tract symptoms    Chronic, stable and asxs per patient. Has seen urology. PSA stable. Declines DRE.       Aorto-iliac atherosclerosis ([place])    Appreciate [facility] care.       Relevant Medications   rosuvastatin (CRESTOR) 5 MG tablet   Advanced care planning/counseling discussion    Advanced directive discussion -discussed. Wife would be HCPOA. Packet at home. Would not want prolonged life support. Would be ok with trial for reversible condition. Encouraged  he discuss this with wife.           Meds ordered this encounter  Medications  . rosuvastatin (CRESTOR) 5 MG tablet    Sig: Take 1 tablet (5 mg total) by mouth daily.    Dispense:  30 tablet    Refill:  11    In place of lovastatin   No orders of the defined types were placed in this encounter.   Follow up plan: Return in about 1 year (around 01/14/2019) for annual exam, prior fasting for blood work, medicare wellness visit.  Ria Bush, MD

## 2018-01-12 NOTE — Assessment & Plan Note (Signed)
Preventative protocols reviewed and updated unless pt declined. Discussed healthy diet and lifestyle.  

## 2018-01-13 ENCOUNTER — Ambulatory Visit (INDEPENDENT_AMBULATORY_CARE_PROVIDER_SITE_OTHER): Payer: Medicare Other | Admitting: Family Medicine

## 2018-01-13 ENCOUNTER — Encounter: Payer: Self-pay | Admitting: Family Medicine

## 2018-01-13 VITALS — BP 116/64 | HR 72 | Temp 97.6°F | Ht 64.5 in | Wt 123.0 lb

## 2018-01-13 DIAGNOSIS — Z7189 Other specified counseling: Secondary | ICD-10-CM | POA: Diagnosis not present

## 2018-01-13 DIAGNOSIS — C772 Secondary and unspecified malignant neoplasm of intra-abdominal lymph nodes: Secondary | ICD-10-CM

## 2018-01-13 DIAGNOSIS — I708 Atherosclerosis of other arteries: Secondary | ICD-10-CM

## 2018-01-13 DIAGNOSIS — R7303 Prediabetes: Secondary | ICD-10-CM

## 2018-01-13 DIAGNOSIS — F17200 Nicotine dependence, unspecified, uncomplicated: Secondary | ICD-10-CM

## 2018-01-13 DIAGNOSIS — N400 Enlarged prostate without lower urinary tract symptoms: Secondary | ICD-10-CM

## 2018-01-13 DIAGNOSIS — I700 Atherosclerosis of aorta: Secondary | ICD-10-CM | POA: Diagnosis not present

## 2018-01-13 DIAGNOSIS — E7800 Pure hypercholesterolemia, unspecified: Secondary | ICD-10-CM

## 2018-01-13 DIAGNOSIS — Z0000 Encounter for general adult medical examination without abnormal findings: Secondary | ICD-10-CM

## 2018-01-13 DIAGNOSIS — C187 Malignant neoplasm of sigmoid colon: Secondary | ICD-10-CM

## 2018-01-13 DIAGNOSIS — E1159 Type 2 diabetes mellitus with other circulatory complications: Secondary | ICD-10-CM | POA: Insufficient documentation

## 2018-01-13 MED ORDER — ROSUVASTATIN CALCIUM 5 MG PO TABS
5.0000 mg | ORAL_TABLET | Freq: Every day | ORAL | 11 refills | Status: DC
Start: 2018-01-13 — End: 2018-06-20

## 2018-01-13 NOTE — Assessment & Plan Note (Signed)
Appreciate onc care. Regularly followed by Dr Burr Medico.

## 2018-01-13 NOTE — Assessment & Plan Note (Signed)
Chronic, LDL above goal in PAD hx. Will change lovastatin to crestor 5mg  daily to QOD as tolerated. Update with effect. The 10-year ASCVD risk score Mikey Bussing DC Brooke Bonito., et al., 2013) is: 17.4%   Values used to calculate the score:     Age: 67 years     Sex: Male     Is Non-Hispanic African American: No     Diabetic: No     Tobacco smoker: Yes     Systolic Blood Pressure: 016 mmHg     Is BP treated: No     HDL Cholesterol: 34.1 mg/dL     Total Cholesterol: 161 mg/dL

## 2018-01-13 NOTE — Assessment & Plan Note (Signed)
Strongly encouraged cessation. Precontemplative. Gets regular CT scans through onc.

## 2018-01-13 NOTE — Assessment & Plan Note (Signed)
Appreciate [facility] care.  

## 2018-01-13 NOTE — Assessment & Plan Note (Addendum)
Advanced directive discussion -discussed. Wife would be HCPOA. Packet at home. Would not want prolonged life support. Would be ok with trial for reversible condition. Encouraged he discuss this with wife.

## 2018-01-13 NOTE — Assessment & Plan Note (Signed)
Encouraged avoiding added sugars in diet.  

## 2018-01-13 NOTE — Patient Instructions (Addendum)
Work on [employer] at home.  Try crestor 5mg  daily to every other day and let me know how you tolerate this.  You are doing well today.  Good to see you today.  Return as needed or in 1 year for next wellness visit and physical.  Health Maintenance After Age 67 After age 53, you are at a higher risk for certain long-term diseases and infections as well as injuries from falls. Falls are a major cause of broken bones and head injuries in people who are older than age 17. Getting regular preventive care can help to keep you healthy and well. Preventive care includes getting regular testing and making lifestyle changes as recommended by your health care provider. Talk with your health care provider about:  Which screenings and tests you should have. A screening is a test that checks for a disease when you have no symptoms.  A diet and exercise plan that is right for you. What should I know about screenings and tests to prevent falls? Screening and testing are the best ways to find a health problem early. Early diagnosis and treatment give you the best chance of managing medical conditions that are common after age 83. Certain conditions and lifestyle choices may make you more likely to have a fall. Your health care provider may recommend:  Regular vision checks. Poor vision and conditions such as cataracts can make you more likely to have a fall. If you wear glasses, make sure to get your prescription updated if your vision changes.  Medicine review. Work with your health care provider to regularly review all of the medicines you are taking, including over-the-counter medicines. Ask your health care provider about any side effects that may make you more likely to have a fall. Tell your health care provider if any medicines that you take make you feel dizzy or sleepy.  Osteoporosis screening. Osteoporosis is a condition that causes the bones to get weaker. This can make the bones weak  and cause them to break more easily.  Blood pressure screening. Blood pressure changes and medicines to control blood pressure can make you feel dizzy.  Strength and balance checks. Your health care provider may recommend certain tests to check your strength and balance while standing, walking, or changing positions.  Foot health exam. Foot pain and numbness, as well as not wearing proper footwear, can make you more likely to have a fall.  Depression screening. You may be more likely to have a fall if you have a fear of falling, feel emotionally low, or feel unable to do activities that you used to do.  Alcohol use screening. Using too much alcohol can affect your balance and may make you more likely to have a fall. What actions can I take to lower my risk of falls? General instructions  Talk with your health care provider about your risks for falling. Tell your health care provider if: ? You fall. Be sure to tell your health care provider about all falls, even ones that seem minor. ? You feel dizzy, sleepy, or off-balance.  Take over-the-counter and prescription medicines only as told by your health care provider. These include any supplements.  Eat a healthy diet and maintain a healthy weight. A healthy diet includes low-fat dairy products, low-fat (lean) meats, and fiber from whole grains, beans, and lots of fruits and vegetables. Home safety  Remove any tripping hazards, such as rugs, cords, and clutter.  Install safety equipment such as grab bars  in bathrooms and safety rails on stairs.  Keep rooms and walkways well-lit. Activity   Follow a regular exercise program to stay fit. This will help you maintain your balance. Ask your health care provider what types of exercise are appropriate for you.  If you need a cane or walker, use it as recommended by your health care provider.  Wear supportive shoes that have nonskid soles. Lifestyle  Do not drink alcohol if your health  care provider tells you not to drink.  If you drink alcohol, limit how much you have: ? 0-1 drink a day for women. ? 0-2 drinks a day for men.  Be aware of how much alcohol is in your drink. In the [place] one drink equals one typical bottle of beer (12 oz), one-half glass of wine (5 oz), or one shot of hard liquor (1 oz).  Do not use any products that contain nicotine or tobacco, such as cigarettes and e-cigarettes. If you need help quitting, ask your health care provider. Summary  Having a healthy lifestyle and getting preventive care can help to protect your health and wellness after age 44.  Screening and testing are the best way to find a health problem early and help you avoid having a fall. Early diagnosis and treatment give you the best chance for managing medical conditions that are more common for people who are older than age 57.  Falls are a major cause of broken bones and head injuries in people who are older than age 14. Take precautions to prevent a fall at home.  Work with your health care provider to learn what changes you can make to improve your health and wellness and to prevent falls. This information is not intended to replace advice given to you by your health care provider. Make sure you discuss any questions you have with your health care provider. Document Released: 11/14/2016 Document Revised: 11/14/2016 Document Reviewed: 11/14/2016 Elsevier Interactive Patient Education  2019 [employer].

## 2018-01-13 NOTE — Assessment & Plan Note (Signed)
Chronic, stable and asxs per patient. Has seen urology. PSA stable. Declines DRE.

## 2018-02-20 DIAGNOSIS — H5213 Myopia, bilateral: Secondary | ICD-10-CM | POA: Diagnosis not present

## 2018-03-05 NOTE — Progress Notes (Signed)
I reviewed health advisor's note, was available for consultation, and agree with documentation and plan.  

## 2018-04-30 ENCOUNTER — Telehealth: Payer: Self-pay | Admitting: Hematology

## 2018-04-30 NOTE — Telephone Encounter (Signed)
Called regarding upcoming appointments, due to concerns of COVID-19 the appointment has been moved from April to May.  Message to provider.

## 2018-05-01 ENCOUNTER — Other Ambulatory Visit: Payer: Medicare Other

## 2018-05-01 ENCOUNTER — Ambulatory Visit: Payer: Medicare Other | Admitting: Hematology

## 2018-06-11 ENCOUNTER — Telehealth: Payer: Self-pay | Admitting: Hematology

## 2018-06-11 NOTE — Telephone Encounter (Signed)
Contacted pt to verify telephone visit for pre reg °

## 2018-06-11 NOTE — Progress Notes (Signed)
[facility]   Telephone:([phone]) [phone] Fax:([phone]) [phone]   Clinic Follow up Note   Patient Care Team: Ria Bush, MD as PCP - General (Family Medicine) Buford Dresser, MD as PCP - Cardiology (Cardiology) Ladene Artist, MD as Consulting Physician (Gastroenterology) [name], MD as Consulting Physician (General Surgery) Truitt Merle, MD as Consulting Physician (Hematology)   I connected with Mario Mcdowell on 06/12/2018 at  1:45 PM EDT by telephone visit and verified that I am speaking with the correct person using two identifiers.  I discussed the limitations, risks, security and privacy concerns of performing an evaluation and management service by telephone and the availability of in person appointments. I also discussed with the patient that there may be a patient responsible charge related to this service. The patient expressed understanding and agreed to proceed.   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 Resected and retrieved. - Rule out malignancy, tumor in the sigmoid colon. Biopsied. Tattooed. - Internal hemorrhoids. - The examination was otherwise normal on direct and retroflexion views.     03/14/2016 Imaging    CT A/P W CONTRAST  IMPRESSION: Short segment area of wall thickening in the  distal sigmoid colon, suspicious for site of primary colon carcinoma. Recommend correlation with colonoscopy results.    04/23/2016 Imaging    CT Chest W Contrast IMPRESSION: 1. No evidence of pulmonary metastasis. 2. Bilateral calcified pulmonary nodules and mediastinal lymph nodes consistent with granulomatous disease. 3. Centrilobular emphysema. 4. Coronary artery calcification and aortic atherosclerotic calcification.    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 Colon, segmental resection for tumor, sigmoid - INVASIVE ADENOCARCINOMA, WELL TO MODERATELY DIFFERENTIATED, WITH ABUNDANT MUCIN (PT3 PN1) - METASTATIC ADENOCARCINOMA IN ONE OF TWENTY-TWO LYMPH NODES (1/22) - TWO TUMOR DEPOSITS PRESENT - MARGINS UNINVOLVED BY CARCINOMA - HYPERPLASTIC POLYPS (X2) - SEE ONCOLOGY TABLE BELOW 2. Colon, resection margin (donut), final distal margin - BENIGN COLON - NO MALIGNANCY IDENTIFIED    05/21/2016 Surgery    LAPAROSCOPIC PARTIAL COLECTOMY, SPLENIC FLEXURE MOBILIZATION AND PROCTOSCOPY    06/25/2016 - 09/03/2016 Chemotherapy    FOLFOX every 2 weeks for 3 months     12/03/2016 Imaging    CT AP 12/03/16 IMPRESSION: Status post sigmoid resection. No evidence of recurrent or metastatic disease.    05/29/2017 Procedure    05/29/2017 Colonoscopy - One 7 mm polyp in the descending colon, removed with a cold snare. Resected and retrieved. - One 4 mm polyp in the transverse colon, removed with a cold biopsy forceps. Resected and retrieved. - Patent end-to-end colo-colonic anastomosis, characterized by healthy appearing mucosa. - Internal hemorrhoids. - The examination was otherwise normal on direct and retroflexion  views.    05/29/2017 Pathology Results    05/29/2017 Surgical Pathology Diagnosis 1. Surgical [P], random sites - BENIGN COLONIC MUCOSA. - NO ACTIVE INFLAMMATION OR  EVIDENCE OF MICROSCOPIC COLITIS. - NO DYSPLASIA OR MALIGNANCY. 2. Surgical [P], descending and transverse, polyp (2) - HYPERPLASTIC POLYP (X2 FRAGMENTS). - NO DYSPLASIA OR MALIGNANCY.    08/29/2017 Imaging    08/29/2017 CT CAP W Contrast IMPRESSION: 1. No findings of active malignancy. 2. 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 Chart review indicates that the patient was seen for this by Dr. Gae Gallop on 01/02/2017. 3. 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 4. Other imaging findings of potential clinical significance: Aortic Atherosclerosis (ICD10-I70.0) and Emphysema (ICD10-J43.9). Coronary atherosclerosis. 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 Small right scrotal hydrocele      CURRENT THERAPY:  Surveillance   INTERVAL HISTORY:  Mario Mcdowell is here for a follow up of colorectal cancer. He was able to identify herself by birth date.  He did come in for lab work today, office visit was changed to a phone visit due to the current COVID-19 pandemic.  He is doing very well, denies any significant or change of bowel habits.  He has good appetite and energy level, weight is stable.  No other complaints.     REVIEW OF SYSTEMS:   Constitutional: Denies fevers, chills or abnormal weight loss Eyes: Denies blurriness of vision Ears, nose, mouth, throat, and face: Denies mucositis or sore throat Respiratory: Denies cough, dyspnea or wheezes Cardiovascular: Denies palpitation, chest discomfort or lower extremity swelling Gastrointestinal:  Denies nausea, heartburn or change in bowel habits Skin: Denies abnormal skin rashes Lymphatics: Denies new lymphadenopathy or easy bruising Neurological:Denies numbness, tingling or new weaknesses Behavioral/Psych: Mood is stable, no new changes  All other systems were reviewed with the patient and are negative.  MEDICAL HISTORY:  Past Medical  History:  Diagnosis Date  . Aorto-iliac atherosclerosis ([place])    by CT; in bilateral legs   . Arthritis   . Atherosclerotic PVD with intermittent claudication ([place])   . BPH without obstruction/lower urinary tract symptoms 10/25/2015  . Centrilobular emphysema ([place])    per chest CT 04-23-2016  . Colon adenocarcinoma [facility]) oncologist- dr Burr Medico   dx 05-21-2016 -- Stage IIIB (pT3,pN1,M0) , Invasive sigmoid adenocarcinoma well to moderately differentiated with metastatic intra-abdominal lymph node (1 out of 22), s/p lap. partial colectomy [name]) 05/21/2016 and chemotherapy is planned  . Full dentures   . History of kidney stones 07/2015   passed without intervention  . Pulmonary nodules    per chest CT 04-23-2016 bilateral nodules (consistent w/ granulomatous disease)  . Smokers' cough ([place])   . Wears contact lenses     SURGICAL HISTORY: Past Surgical History:  Procedure Laterality Date  . COLONOSCOPY  03/12/2016   4 polyps, sigmoid tumor biopsied (TVA w/ MICROSCOPIC FOCUS SUSPICIOUS FOR INVASION) pending surgical resection, rpt 1 yr [facility] Plan)  . COLONOSCOPY  05/2017   2 benign polyps, rpt 2 yrs [facility] Plan)  . LAPAROSCOPIC PARTIAL COLECTOMY N/A 05/21/2016   colon cancer, 1/20 positive lymp nodes. LAPAROSCOPIC PARTIAL COLECTOMY, SPLENIC FLEXURE MOBILIZATION AND PROCTOSCOPY;  [name], MD)  . MULTIPLE TOOTH EXTRACTIONS  1980's  . PORT-A-CATH REMOVAL Right 10/04/2016   Procedure: REMOVAL PORT-A-CATH;  Surgeon: [name], MD;  Location: [facility];  Service: General;  Laterality: Right;  . PORTACATH PLACEMENT N/A 06/21/2016   Procedure: INSERTION PORT-A-CATH;  Surgeon: [name],  Elmo Putt, MD;  Location: [facility];  Service: General;  Laterality: N/A;  right    I have reviewed the social history and family history with the patient and they are unchanged from previous note.  ALLERGIES:  is allergic to penicillins and lipitor [atorvastatin].  MEDICATIONS:   Current Outpatient Medications  Medication Sig Dispense Refill  . aspirin EC 81 MG tablet Take 81 mg by mouth daily.    Marland Kitchen lovastatin (MEVACOR) 40 MG tablet Take 1 tablet (40 mg total) by mouth at bedtime. (Patient taking differently: Take 40 mg by mouth at bedtime. Takes 1/2 tablet at bedtime) 30 tablet 6  . rosuvastatin (CRESTOR) 5 MG tablet Take 1 tablet (5 mg total) by mouth daily. 30 tablet 11   Current Facility-Administered Medications  Medication Dose Route Frequency Provider Last Rate Last Dose  . 0.9 %  sodium chloride infusion  500 mL Intravenous Once Ladene Artist, MD        PHYSICAL EXAMINATION: ECOG PERFORMANCE STATUS: 0 - Asymptomatic  No vitals taken today, Exam not performed today   LABORATORY DATA:  I have reviewed the data as listed CBC Latest Ref Rng & Units 06/12/2018 01/02/2018 08/29/2017  WBC 4.0 - 10.5 K/uL 10.7(H) 7.6 8.1  Hemoglobin 13.0 - 17.0 g/dL 13.2 13.9 15.7  Hematocrit 39.0 - 52.0 % 39.1 41.5 46.1  Platelets 150 - 400 K/uL 267 163 161     CMP Latest Ref Rng & Units 06/12/2018 01/02/2018 08/29/2017  Glucose 70 - 99 mg/dL 90 160(H) 83  BUN 8 - 23 mg/dL '15 13 15  '$ Creatinine 0.61 - 1.24 mg/dL 0.81 0.91 0.98  Sodium 135 - 145 mmol/L 136 139 141  Potassium 3.5 - 5.1 mmol/L 4.2 4.2 4.6  Chloride 98 - 111 mmol/L 101 105 103  CO2 22 - 32 mmol/L '26 24 27  '$ Calcium 8.9 - 10.3 mg/dL 9.0 8.8(L) 9.7  Total Protein 6.5 - 8.1 g/dL 7.0 6.4(L) 7.5  Total Bilirubin 0.3 - 1.2 mg/dL 0.2(L) <0.2(L) 0.3  Alkaline Phos 38 - 126 U/L 93 79 86  AST 15 - 41 U/L '22 19 22  '$ ALT 0 - 44 U/L '26 16 23      '$ RADIOGRAPHIC STUDIES: I have personally reviewed the radiological images as listed and agreed with the findings in the report. No results found.   ASSESSMENT & PLAN:  Mario Mcdowell is a 68 y.o. male with   1. 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 He is s/p hemicolectomy and treated with  adjuvant FOLFOX.  He is on surveillance now. -He is clinically doing well without any symptoms. Labs reviewed, CBC CMP and CEA are WNL except WBC 10.7, albumin 3.3, bili 0.2. There is no clinical concern for recurrence. -He is 2 year since diagnosis. After 2 years his risk of recurrence drops significantly. His last scan in 08/2017 showed NED -Continue surveillance. Next scan in 08/2018.  -F/u in 3 months   2. Smoking cessation  -He previously declined Nicotine patches or counseling to help him quit. -He is still smoking 1 pack or less a day. He has been trying to quit, but unsuccessful. -I again discussed smoking causes blockage of his blood vessels and stroke.    3. Emphysema  4. BPH -01/10/18 PSA normal at 0.84  -Will continue to be followed by his PCP.   5. History of kidney stones  6. PAD -Prior CT scan showed complete occlusion  of his abdominal aorta. He is reluctant to proceed with surgery. His risk from infection s/p chemo is lower now. He is cleared to proceed.  -He will contact his vascular surgeon Dr. Nadyne Coombes he is ready to proceed  PLAN -lab reviewed. He is clinically doing well with no concern for recurrence. Continue surveillance.  -F/uand labsin32month, [facility]      No problem-specific Assessment & Plan notes found for this encounter.   Orders Placed This Encounter  Procedures  . CT Abdomen Pelvis W Contrast    Standing Status:   Future    Standing Expiration Date:   06/12/2019    Order Specific Question:   If indicated for the ordered procedure, I authorize the administration of contrast media per Radiology protocol    Answer:   Yes    Order Specific Question:   [facility]    [facility]   Order Specific Question:   Is Oral Contrast requested for this exam?    Answer:   Yes, Per Radiology protocol    Order Specific Question:   Radiology Contrast Protocol - do  NOT remove file path    Answer:   \\[email or web address]  . CT Chest W Contrast    Standing Status:   Future    Standing Expiration Date:   06/12/2019    Order Specific Question:   If indicated for the ordered procedure, I authorize the administration of contrast media per Radiology protocol    Answer:   Yes    Order Specific Question:   Pref[facility] imaging [facility]?    [facility]   Order Specific Question:   Radiology Contrast Protocol - do NOT remove file path    Answer:   \\[email or web address]   I discussed the assessment and treatment plan with the patient. The patient was provided an opportunity to ask questions and all were answered. The patient agreed with the plan and demonstrated an understanding of the instructions.  The patient was advised to call back or seek an in-person evaluation if the symptoms worsen or if the condition fails to improve as anticipated.  I provided 12 minutes of non face-to-face telephone visit time during this encounter, and > 50% was spent counseling as documented under my assessment & plan.    YTruitt Merle MD 06/12/2018   I, AJoslyn Devon am acting as scribe for YTruitt Merle MD.   I have reviewed the above documentation for accuracy and completeness, and I agree with the above.

## 2018-06-12 ENCOUNTER — Other Ambulatory Visit: Payer: Self-pay

## 2018-06-12 ENCOUNTER — Inpatient Hospital Stay: Payer: Medicare Other

## 2018-06-12 ENCOUNTER — Encounter: Payer: Self-pay | Admitting: Hematology

## 2018-06-12 ENCOUNTER — Inpatient Hospital Stay: Payer: Medicare Other | Attending: Hematology | Admitting: Hematology

## 2018-06-12 ENCOUNTER — Other Ambulatory Visit: Payer: Medicare Other

## 2018-06-12 DIAGNOSIS — C187 Malignant neoplasm of sigmoid colon: Secondary | ICD-10-CM

## 2018-06-12 DIAGNOSIS — C772 Secondary and unspecified malignant neoplasm of intra-abdominal lymph nodes: Secondary | ICD-10-CM | POA: Diagnosis not present

## 2018-06-12 LAB — CBC WITH DIFFERENTIAL/PLATELET
Abs Immature Granulocytes: 0.04 10*3/uL (ref 0.00–0.07)
Basophils Absolute: 0.1 10*3/uL (ref 0.0–0.1)
Basophils Relative: 1 %
Eosinophils Absolute: 0.1 10*3/uL (ref 0.0–0.5)
Eosinophils Relative: 1 %
HCT: 39.1 % (ref 39.0–52.0)
Hemoglobin: 13.2 g/dL (ref 13.0–17.0)
Immature Granulocytes: 0 %
Lymphocytes Relative: 27 %
Lymphs Abs: 2.9 10*3/uL (ref 0.7–4.0)
MCH: 30.3 pg (ref 26.0–34.0)
MCHC: 33.8 g/dL (ref 30.0–36.0)
MCV: 89.7 fL (ref 80.0–100.0)
Monocytes Absolute: 0.7 10*3/uL (ref 0.1–1.0)
Monocytes Relative: 7 %
Neutro Abs: 6.8 10*3/uL (ref 1.7–7.7)
Neutrophils Relative %: 64 %
Platelets: 267 10*3/uL (ref 150–400)
RBC: 4.36 MIL/uL (ref 4.22–5.81)
RDW: 13.4 % (ref 11.5–15.5)
WBC: 10.7 10*3/uL — ABNORMAL HIGH (ref 4.0–10.5)
nRBC: 0 % (ref 0.0–0.2)

## 2018-06-12 LAB — CMP (CANCER CENTER ONLY)
ALT: 26 U/L (ref 0–44)
AST: 22 U/L (ref 15–41)
Albumin: 3.3 g/dL — ABNORMAL LOW (ref 3.5–5.0)
Alkaline Phosphatase: 93 U/L (ref 38–126)
Anion gap: 9 (ref 5–15)
BUN: 15 mg/dL (ref 8–23)
CO2: 26 mmol/L (ref 22–32)
Calcium: 9 mg/dL (ref 8.9–10.3)
Chloride: 101 mmol/L (ref 98–111)
Creatinine: 0.81 mg/dL (ref 0.61–1.24)
GFR, Est AFR Am: >60 mL/min (ref >60)
GFR, Estimated: >60 mL/min (ref >60)
Glucose, Bld: 90 mg/dL (ref 70–99)
Potassium: 4.2 mmol/L (ref 3.5–5.1)
Sodium: 136 mmol/L (ref 135–145)
Total Bilirubin: 0.2 mg/dL — ABNORMAL LOW (ref 0.3–1.2)
Total Protein: 7 g/dL (ref 6.5–8.1)

## 2018-06-12 LAB — CEA (IN HOUSE-CHCC): CEA (CHCC-In House): 4.02 ng/mL (ref 0.00–5.00)

## 2018-06-13 ENCOUNTER — Telehealth: Payer: Self-pay | Admitting: Hematology

## 2018-06-13 NOTE — Telephone Encounter (Signed)
Scheduled appt per 5/28 los. ° °A calendar will be mailed out. °

## 2018-06-20 ENCOUNTER — Encounter: Payer: Self-pay | Admitting: Family Medicine

## 2018-06-20 ENCOUNTER — Ambulatory Visit (INDEPENDENT_AMBULATORY_CARE_PROVIDER_SITE_OTHER): Payer: Medicare Other | Admitting: Family Medicine

## 2018-06-20 VITALS — Ht 64.5 in

## 2018-06-20 DIAGNOSIS — G4762 Sleep related leg cramps: Secondary | ICD-10-CM | POA: Insufficient documentation

## 2018-06-20 MED ORDER — TIZANIDINE HCL 2 MG PO TABS: 2.0000 mg | ORAL_TABLET | Freq: Every day | ORAL | 0 refills | Status: DC | PRN

## 2018-06-20 NOTE — Progress Notes (Signed)
Virtual visit completed through [facility].Me. Due to national recommendations of social distancing due to COVID-19, a virtual visit is felt to be most appropriate for this patient at this time. Reviewed limitations of a virtual visit.  Interactive audio and video telecommunications were attempted between myself and Broc Caspers, however failed due to patient having technical difficulties - phone did not allow him access to [employer].Me. We continued and completed visit with audio only.  Time: 1:00pm - 1:15pm  Patient location: at work Provider location: Financial controller at [facility], office If any vitals were documented, they were collected by patient at home unless specified below.    Ht 5' 4.5" (1.638 m)    BMI 20.79 kg/m    CC: bilat leg cramping Subjective:    Patient ID: Mario Mcdowell, male    DOB: [birth year], 68 y.o.   MRN: [id number]  HPI: Mario Mcdowell is a 68 y.o. male presenting on 06/20/2018 for Leg Pain (C/o bilateral leg cramps.  Started 6-8 mos ago and is worsening. )   Noticing "charlie horse" cramping of calves at night when he's trying to sleep. This is just happening at night. Wake him up at night, skin of legs gets hard.   OTC potassium hasn't helped. Also tried OTC cramp pills without relief. Also tried wife's muscle relaxant with benefit. Tried soap under the cover without benefit.   Denies paresthesias/periph neuropathy.  Ongoing for 6-8 months.   No back pain, thigh pain, knee pain.  No fevers/chills.       Relevant past medical, surgical, family and social history reviewed and updated as indicated. Interim medical history since our last visit reviewed. Allergies and medications reviewed and updated. Outpatient Medications Prior to Visit  Medication Sig Dispense Refill   aspirin EC 81 MG tablet Take 81 mg by mouth daily.     lovastatin (MEVACOR) 40 MG tablet Take 1 tablet (40 mg total) by mouth at bedtime. (Patient taking differently: Take 40 mg by mouth at bedtime. Takes 1/2  tablet at bedtime) 30 tablet 6   rosuvastatin (CRESTOR) 5 MG tablet Take 1 tablet (5 mg total) by mouth daily. 30 tablet    rosuvastatin (CRESTOR) 5 MG tablet Take 1 tablet (5 mg total) by mouth daily. 30 tablet 11   Facility-Administered Medications Prior to Visit  Medication Dose Route Frequency Provider Last Rate Last Dose   0.9 %  sodium chloride infusion  500 mL Intravenous Once Ladene Artist, MD         Per HPI unless specifically indicated in ROS section below Review of Systems Objective:    Ht 5' 4.5" (1.638 m)    BMI 20.79 kg/m   Wt Readings from Last 3 Encounters:  01/13/18 123 lb (55.8 kg)  01/10/18 122 lb 12 oz (55.7 kg)  01/02/18 123 lb 9.6 oz (56.1 kg)     Physical exam: Gen: alert, NAD, not ill appearing Pulm: speaks in complete sentences without increased work of breathing Psych: normal mood, normal thought content      Results for orders placed or performed in visit on 06/12/18  CEA (IN HOUSE-[facility])  Result Value Ref Range   CEA ([facility]-In House) 4.02 0.00 - 5.00 ng/mL  CBC with Differential  Result Value Ref Range   WBC 10.7 (H) 4.0 - 10.5 K/uL   RBC 4.36 4.22 - 5.81 MIL/uL   Hemoglobin 13.2 13.0 - 17.0 g/dL   HCT 39.1 39.0 - 52.0 %   MCV 89.7 80.0 - 100.0 fL  MCH 30.3 26.0 - 34.0 pg   MCHC 33.8 30.0 - 36.0 g/dL   RDW 13.4 11.5 - 15.5 %   Platelets 267 150 - 400 K/uL   nRBC 0.0 0.0 - 0.2 %   Neutrophils Relative % 64 %   Neutro Abs 6.8 1.7 - 7.7 K/uL   Lymphocytes Relative 27 %   Lymphs Abs 2.9 0.7 - 4.0 K/uL   Monocytes Relative 7 %   Monocytes Absolute 0.7 0.1 - 1.0 K/uL   Eosinophils Relative 1 %   Eosinophils Absolute 0.1 0.0 - 0.5 K/uL   Basophils Relative 1 %   Basophils Absolute 0.1 0.0 - 0.1 K/uL   Immature Granulocytes 0 %   Abs Immature Granulocytes 0.04 0.00 - 0.07 K/uL  CMP (Cancer Center only)  Result Value Ref Range   Sodium 136 135 - 145 mmol/L   Potassium 4.2 3.5 - 5.1 mmol/L   Chloride 101 98 - 111 mmol/L   CO2 26 22 -  32 mmol/L   Glucose, Bld 90 70 - 99 mg/dL   BUN 15 8 - 23 mg/dL   Creatinine 0.81 0.61 - 1.24 mg/dL   Calcium 9.0 8.9 - 10.3 mg/dL   Total Protein 7.0 6.5 - 8.1 g/dL   Albumin 3.3 (L) 3.5 - 5.0 g/dL   AST 22 15 - 41 U/L   ALT 26 0 - 44 U/L   Alkaline Phosphatase 93 38 - 126 U/L   Total Bilirubin 0.2 (L) 0.3 - 1.2 mg/dL   GFR, Est Non Af Am >60 >60 mL/min   GFR, Est AFR Am >60 >60 mL/min   Anion gap 9 5 - 15   Assessment & Plan:   Problem List Items Addressed This Visit    Nocturnal leg cramps - Primary    This does not sound consistent with claudication or statin induced myalgias but rather nocturnal leg cramps. Failed treatment tried at home to date. Will add muscle relaxant, discussed trying small amt tonic water as needed (caution with overuse). Encouraged loose sheets in bed, encouraged stretching and exercise during the day. If not improving he will let us know for labs (electrolytes, CPK, TSH, iron, vitamins etc) and consider further treatment like gabapentin or B complex or E vitamins.           Meds ordered this encounter  Medications   tiZANidine (ZANAFLEX) 2 MG tablet    Sig: Take 1 tablet (2 mg total) by mouth daily as needed for muscle spasms (cramping (sedation precautions)).    Dispense:  30 tablet    Refill:  0   No orders of the defined types were placed in this encounter.   I discussed the assessment and treatment plan with the patient. The patient was provided an opportunity to ask questions and all were answered. The patient agreed with the plan and demonstrated an understanding of the instructions. The patient was advised to call back or seek an in-person evaluation if the symptoms worsen or if the condition fails to improve as anticipated.  Follow up plan: No follow-ups on file.  Ria Bush, MD

## 2018-06-20 NOTE — Assessment & Plan Note (Addendum)
This does not sound consistent with claudication or statin induced myalgias but rather nocturnal leg cramps. Failed treatment tried at home to date. Will add muscle relaxant, discussed trying small amt tonic water as needed (caution with overuse). Encouraged loose sheets in bed, encouraged stretching and exercise during the day. If not improving he will let us know for labs (electrolytes, CPK, TSH, iron, vitamins etc) and consider further treatment like gabapentin or B complex or E vitamins.

## 2018-06-27 ENCOUNTER — Ambulatory Visit (INDEPENDENT_AMBULATORY_CARE_PROVIDER_SITE_OTHER): Payer: Medicare Other | Admitting: Podiatry

## 2018-06-27 ENCOUNTER — Encounter: Payer: Self-pay | Admitting: Podiatry

## 2018-06-27 ENCOUNTER — Other Ambulatory Visit: Payer: Self-pay

## 2018-06-27 DIAGNOSIS — Q828 Other specified congenital malformations of skin: Secondary | ICD-10-CM | POA: Diagnosis not present

## 2018-06-27 DIAGNOSIS — L84 Corns and callosities: Secondary | ICD-10-CM

## 2018-06-27 NOTE — Progress Notes (Signed)
This patient presents the office for follow-up treatment for painful calluses on both feet.  Patient states he has tried to trim the callus himself at home but the calluses have become thick and painful.  He presents the office today for preventative foot care services  Vascular  Dorsalis pedis and posterior tibial pulses are palpable  B/L.  Capillary return  WNL.  Temperature gradient is  WNL.  Skin turgor  WNL  Sensorium  Senn Weinstein monofilament wire  WNL. Normal tactile sensation.  Nail Exam  Patient has normal nails with no evidence of bacterial or fungal infection.  Orthopedic  Exam  Muscle tone and muscle strength  WNL.  No limitations of motion feet  B/L.  No crepitus or joint effusion noted.  Foot type is unremarkable and digits show no abnormalities.  Bony prominences are unremarkable.  Skin  No open lesions.  Normal skin texture and turgor.  Callus sub 5th met  B/L and right heel.  Porokeratosis/heel callus right  ROV.  Debride porokeratosis/callus B/L.  RTC prn.   [name] DPM

## 2018-07-14 ENCOUNTER — Other Ambulatory Visit: Payer: Self-pay | Admitting: Family Medicine

## 2018-08-11 ENCOUNTER — Telehealth: Payer: Self-pay | Admitting: *Deleted

## 2018-08-11 MED ORDER — TIZANIDINE HCL 4 MG PO TABS: 4.0000 mg | ORAL_TABLET | Freq: Two times a day (BID) | ORAL | 1 refills | Status: DC | PRN

## 2018-08-11 NOTE — Telephone Encounter (Signed)
Patent's wife left a voicemail stating that he was prescribed Tizanidine 2 mg. Patient's wife requested that the dose be increased to 4 mg, because patient is having to take 2 to help with his cramps.  Pharmacy [facility]

## 2018-08-11 NOTE — Telephone Encounter (Signed)
Ok to try higher dose, with sedation precautions.

## 2018-08-11 NOTE — Telephone Encounter (Signed)
Left message on vm per dpr relaying Dr. Synthia Innocent message.  Also, notified Mario Mcdowell rx was sent to pharmacy.

## 2018-09-08 ENCOUNTER — Ambulatory Visit (HOSPITAL_COMMUNITY)
Admission: RE | Admit: 2018-09-08 | Discharge: 2018-09-08 | Disposition: A | Discharge status: Home / Self Care | Payer: Medicare Other | Source: Ambulatory Visit | Attending: Hematology | Admitting: Hematology

## 2018-09-08 ENCOUNTER — Inpatient Hospital Stay: Payer: Medicare Other | Attending: Hematology

## 2018-09-08 ENCOUNTER — Encounter (HOSPITAL_COMMUNITY): Payer: Self-pay

## 2018-09-08 ENCOUNTER — Other Ambulatory Visit: Payer: Self-pay

## 2018-09-08 DIAGNOSIS — C187 Malignant neoplasm of sigmoid colon: Secondary | ICD-10-CM | POA: Insufficient documentation

## 2018-09-08 DIAGNOSIS — C772 Secondary and unspecified malignant neoplasm of intra-abdominal lymph nodes: Secondary | ICD-10-CM | POA: Insufficient documentation

## 2018-09-08 DIAGNOSIS — J181 Lobar pneumonia, unspecified organism: Secondary | ICD-10-CM | POA: Diagnosis not present

## 2018-09-08 DIAGNOSIS — C189 Malignant neoplasm of colon, unspecified: Secondary | ICD-10-CM | POA: Diagnosis not present

## 2018-09-08 DIAGNOSIS — I7409 Other arterial embolism and thrombosis of abdominal aorta: Secondary | ICD-10-CM | POA: Diagnosis not present

## 2018-09-08 LAB — COMPREHENSIVE METABOLIC PANEL
ALT: 24 U/L (ref 0–44)
AST: 25 U/L (ref 15–41)
Albumin: 3.9 g/dL (ref 3.5–5.0)
Alkaline Phosphatase: 110 U/L (ref 38–126)
Anion gap: 10 (ref 5–15)
BUN: 12 mg/dL (ref 8–23)
CO2: 25 mmol/L (ref 22–32)
Calcium: 9.3 mg/dL (ref 8.9–10.3)
Chloride: 105 mmol/L (ref 98–111)
Creatinine, Ser: 0.85 mg/dL (ref 0.61–1.24)
GFR calc Af Amer: >60 mL/min (ref >60)
GFR calc non Af Amer: >60 mL/min (ref >60)
Glucose, Bld: 91 mg/dL (ref 70–99)
Potassium: 4.6 mmol/L (ref 3.5–5.1)
Sodium: 140 mmol/L (ref 135–145)
Total Bilirubin: 0.3 mg/dL (ref 0.3–1.2)
Total Protein: 7.5 g/dL (ref 6.5–8.1)

## 2018-09-08 LAB — CBC WITH DIFFERENTIAL/PLATELET
Abs Immature Granulocytes: 0.02 10*3/uL (ref 0.00–0.07)
Basophils Absolute: 0.1 10*3/uL (ref 0.0–0.1)
Basophils Relative: 1 %
Eosinophils Absolute: 0.1 10*3/uL (ref 0.0–0.5)
Eosinophils Relative: 1 %
HCT: 46.9 % (ref 39.0–52.0)
Hemoglobin: 15.6 g/dL (ref 13.0–17.0)
Immature Granulocytes: 0 %
Lymphocytes Relative: 43 %
Lymphs Abs: 3.4 10*3/uL (ref 0.7–4.0)
MCH: 30.8 pg (ref 26.0–34.0)
MCHC: 33.3 g/dL (ref 30.0–36.0)
MCV: 92.7 fL (ref 80.0–100.0)
Monocytes Absolute: 0.5 10*3/uL (ref 0.1–1.0)
Monocytes Relative: 7 %
Neutro Abs: 3.9 10*3/uL (ref 1.7–7.7)
Neutrophils Relative %: 48 %
Platelets: 197 10*3/uL (ref 150–400)
RBC: 5.06 MIL/uL (ref 4.22–5.81)
RDW: 17 % — ABNORMAL HIGH (ref 11.5–15.5)
WBC: 7.9 10*3/uL (ref 4.0–10.5)
nRBC: 0 % (ref 0.0–0.2)

## 2018-09-08 LAB — CEA (IN HOUSE-CHCC): CEA (CHCC-In House): 6.02 ng/mL — ABNORMAL HIGH (ref 0.00–5.00)

## 2018-09-08 MED ORDER — IOHEXOL 300 MG/ML  SOLN
100.0000 mL | Freq: Once | INTRAMUSCULAR | Status: AC | PRN
  Administered 2018-09-08: 100 mL via INTRAVENOUS

## 2018-09-08 MED ORDER — SODIUM CHLORIDE (PF) 0.9 % IJ SOLN
INTRAMUSCULAR | Status: AC
  Filled 2018-09-08: qty 50

## 2018-09-12 ENCOUNTER — Telehealth: Payer: Self-pay

## 2018-09-12 ENCOUNTER — Inpatient Hospital Stay: Payer: Medicare Other | Admitting: Hematology

## 2018-09-12 NOTE — Telephone Encounter (Signed)
Patient's wife Tomi Bamberger calls back regarding my previous message.  She states he has not been running a fever, does have a cough.  Per Dr. Burr Medico patient needs to be tested for COVID-19 I explained to her he needs to go to the [facility] location for testing. She states she will make sure he goes on Monday morning. I explained we will be back in tough when we know the result and reschedule his appointment with Dr. Burr Medico.  She verbalized an understanding.

## 2018-09-12 NOTE — Telephone Encounter (Signed)
Left another message regarding his appointment today being cancelled until after his testing for COVID.  Requested he please call back and let us know what his symptoms are.

## 2018-09-12 NOTE — Telephone Encounter (Signed)
Left voice message for patient that CT scan showed pneumonia needed to speak with him regarding his symptoms and Dr. Ernestina Penna recommendation to get tested for COVID-19.  Asked him to call me back as we are cancelling his appointment for today and will r/s end of next week after we get test results.

## 2018-09-15 ENCOUNTER — Telehealth: Payer: Self-pay | Admitting: Hematology

## 2018-09-15 ENCOUNTER — Other Ambulatory Visit: Payer: Self-pay

## 2018-09-15 DIAGNOSIS — R6889 Other general symptoms and signs: Secondary | ICD-10-CM | POA: Diagnosis not present

## 2018-09-15 DIAGNOSIS — Z20822 Contact with and (suspected) exposure to covid-19: Secondary | ICD-10-CM

## 2018-09-15 NOTE — Progress Notes (Signed)
[place]   Telephone:([phone]) [phone] Fax:([phone]) [phone]   Clinic Follow up Note   Patient Care Team: Ria Bush, MD as PCP - General (Family Medicine) Buford Dresser, MD as PCP - Cardiology (Cardiology) Ladene Artist, MD as Consulting Physician (Gastroenterology) [name], MD as Consulting Physician (General Surgery) Truitt Merle, MD as Consulting Physician (Hematology)   I connected with Mario Mcdowell on 09/17/2018 at 12:40 PM EDT by telephone visit and verified that I am speaking with the correct person using two identifiers.  I discussed the limitations, risks, security and privacy concerns of performing an evaluation and management service by telephone and the availability of in person appointments. I also discussed with the patient that there may be a patient responsible charge related to this service. The patient expressed understanding and agreed to proceed.   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 Resected and retrieved. - Rule out malignancy, tumor in the sigmoid colon. Biopsied. Tattooed. - Internal hemorrhoids. - The examination was otherwise normal on direct and retroflexion views.    03/14/2016 Imaging   CT A/P W CONTRAST  IMPRESSION: Short segment area of wall thickening in the distal sigmoid colon, suspicious for site of primary colon carcinoma. Recommend  correlation with colonoscopy results.   04/23/2016 Imaging   CT Chest W Contrast IMPRESSION: 1. No evidence of pulmonary metastasis. 2. Bilateral calcified pulmonary nodules and mediastinal lymph nodes consistent with granulomatous disease. 3. Centrilobular emphysema. 4. Coronary artery calcification and aortic atherosclerotic calcification.   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 Colon, segmental resection for tumor, sigmoid - INVASIVE ADENOCARCINOMA, WELL TO MODERATELY DIFFERENTIATED, WITH ABUNDANT MUCIN (PT3 PN1) - METASTATIC ADENOCARCINOMA IN ONE OF TWENTY-TWO LYMPH NODES (1/22) - TWO TUMOR DEPOSITS PRESENT - MARGINS UNINVOLVED BY CARCINOMA - HYPERPLASTIC POLYPS (X2) - SEE ONCOLOGY TABLE BELOW 2. Colon, resection margin (donut), final distal margin - BENIGN COLON - NO MALIGNANCY IDENTIFIED   05/21/2016 Surgery   LAPAROSCOPIC PARTIAL COLECTOMY, SPLENIC FLEXURE MOBILIZATION AND PROCTOSCOPY   06/25/2016 - 09/03/2016 Chemotherapy   FOLFOX every 2 weeks for 3 months    12/03/2016 Imaging   CT AP 12/03/16 IMPRESSION: Status post sigmoid resection. No evidence of recurrent or metastatic disease.   05/29/2017 Procedure   05/29/2017 Colonoscopy - One 7 mm polyp in the descending colon, removed with a cold snare. Resected and retrieved. - One 4 mm polyp in the transverse colon, removed with a cold biopsy forceps. Resected and retrieved. - Patent end-to-end colo-colonic anastomosis, characterized by healthy appearing mucosa. - Internal hemorrhoids. - The examination was otherwise normal on direct and retroflexion views.   05/29/2017 Pathology Results   05/29/2017 Surgical Pathology Diagnosis 1. Surgical [P], random sites - BENIGN COLONIC MUCOSA. - NO ACTIVE INFLAMMATION OR EVIDENCE OF MICROSCOPIC COLITIS. - NO DYSPLASIA OR MALIGNANCY. 2. Surgical [  P], descending and  transverse, polyp (2) - HYPERPLASTIC POLYP (X2 FRAGMENTS). - NO DYSPLASIA OR MALIGNANCY.   08/29/2017 Imaging   08/29/2017 CT CAP W Contrast IMPRESSION: 1. No findings of active malignancy. 2. 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 Chart review indicates that the patient was seen for this by Dr. Gae Gallop on 01/02/2017. 3. 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 4. Other imaging findings of potential clinical significance: Aortic Atherosclerosis (ICD10-I70.0) and Emphysema (ICD10-J43.9). Coronary atherosclerosis. 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 Small right scrotal hydrocele   09/08/2018 Imaging   CT CAP W Contrsat 09/08/18  IMPRESSION: 1. New areas of consolidated lung in the right upper lobe along with mild right hilar adenopathy. Cause for the consolidation is uncertain and by report the patient is without symptoms/complaints. Presumably this represents pneumonia, follow up chest radiography recommended to ensure clearance and exclude underlying malignancy. 2. 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 Aortic Atherosclerosis (ICD10-I70.0). Coronary atherosclerosis. 3. Other imaging findings of potential clinical significance: Emphysema (ICD10-J43.9). Old granulomatous disease. Borderline prominent common bile duct size at 0.7 cm in diameter.      CURRENT THERAPY:  Surveillance  INTERVAL HISTORY:  Mario Mcdowell is here for a follow up of colorectal cancer. He notes he is doing well. He notes he clears his throat a few times a day but denies cough, fever or SOB. He feels overall well and stable.    REVIEW OF SYSTEMS:   Constitutional: Denies fevers, chills or abnormal weight loss Eyes: Denies blurriness of vision Ears, nose, mouth, throat, and face: Denies mucositis or sore  throat Respiratory: Denies cough, dyspnea or wheezes (+) clears through a few times a day  Cardiovascular: Denies palpitation, chest discomfort or lower extremity swelling Gastrointestinal:  Denies nausea, heartburn or change in bowel habits Skin: Denies abnormal skin rashes Lymphatics: Denies new lymphadenopathy or easy bruising Neurological:Denies numbness, tingling or new weaknesses Behavioral/Psych: Mood is stable, no new changes  All other systems were reviewed with the patient and are negative.  MEDICAL HISTORY:  Past Medical History:  Diagnosis Date  . Aorto-iliac atherosclerosis ([place])    by CT; in bilateral legs   . Arthritis   . Atherosclerotic PVD with intermittent claudication ([place])   . BPH without obstruction/lower urinary tract symptoms 10/25/2015  . Centrilobular emphysema ([place])    per chest CT 04-23-2016  . Colon adenocarcinoma [facility]) oncologist- dr Burr Medico   dx 05-21-2016 -- Stage IIIB (pT3,pN1,M0) , Invasive sigmoid adenocarcinoma well to moderately differentiated with metastatic intra-abdominal lymph node (1 out of 22), s/p lap. partial colectomy [name]) 05/21/2016 and chemotherapy is planned  . Full dentures   . History of kidney stones 07/2015   passed without intervention  . Pulmonary nodules    per chest CT 04-23-2016 bilateral nodules (consistent w/ granulomatous disease)  . Smokers' cough ([place])   . Wears contact lenses     SURGICAL HISTORY: Past Surgical History:  Procedure Laterality Date  . COLONOSCOPY  03/12/2016   4 polyps, sigmoid tumor biopsied (TVA w/ MICROSCOPIC FOCUS SUSPICIOUS FOR INVASION) pending surgical resection, rpt 1 yr [facility] Plan)  . COLONOSCOPY  05/2017   2 benign polyps, rpt 2 yrs [facility] Plan)  . LAPAROSCOPIC PARTIAL COLECTOMY N/A 05/21/2016   colon cancer, 1/20 positive lymp nodes. LAPAROSCOPIC PARTIAL COLECTOMY, SPLENIC FLEXURE MOBILIZATION AND PROCTOSCOPY;  [name], MD)  . MULTIPLE TOOTH EXTRACTIONS  1980's  .  PORT-A-CATH REMOVAL Right  10/04/2016   Procedure: REMOVAL PORT-A-CATH;  Surgeon: [name], MD;  Location: [facility];  Service: General;  Laterality: Right;  . PORTACATH PLACEMENT N/A 06/21/2016   Procedure: INSERTION PORT-A-CATH;  Surgeon: [name], MD;  Location: [facility];  Service: General;  Laterality: N/A;  right    I have reviewed the social history and family history with the patient and they are unchanged from previous note.  ALLERGIES:  is allergic to penicillins and lipitor [atorvastatin].  MEDICATIONS:  Current Outpatient Medications  Medication Sig Dispense Refill  . aspirin EC 81 MG tablet Take 81 mg by mouth daily.    Marland Kitchen doxycycline (VIBRA-TABS) 100 MG tablet Take 1 tablet (100 mg total) by mouth 2 (two) times daily. 20 tablet 0  . rosuvastatin (CRESTOR) 5 MG tablet Take 1 tablet (5 mg total) by mouth daily. 30 tablet   . tiZANidine (ZANAFLEX) 4 MG tablet Take 1 tablet (4 mg total) by mouth 2 (two) times daily as needed for muscle spasms (sedation precautions). 30 tablet 1   Current Facility-Administered Medications  Medication Dose Route Frequency Provider Last Rate Last Dose  . 0.9 %  sodium chloride infusion  500 mL Intravenous Once Ladene Artist, MD        PHYSICAL EXAMINATION: ECOG PERFORMANCE STATUS: 0 - Asymptomatic  No vitals taken today, Exam not performed today   LABORATORY DATA:  I have reviewed the data as listed CBC Latest Ref Rng & Units 09/08/2018 06/12/2018 01/02/2018  WBC 4.0 - 10.5 K/uL 7.9 10.7(H) 7.6  Hemoglobin 13.0 - 17.0 g/dL 15.6 13.2 13.9  Hematocrit 39.0 - 52.0 % 46.9 39.1 41.5  Platelets 150 - 400 K/uL 197 267 163     CMP Latest Ref Rng & Units 09/08/2018 06/12/2018 01/02/2018  Glucose 70 - 99 mg/dL 91 90 160(H)  BUN 8 - 23 mg/dL '12 15 13  '$ Creatinine 0.61 - 1.24 mg/dL 0.85 0.81 0.91  Sodium 135 - 145 mmol/L 140 136 139  Potassium 3.5 - 5.1 mmol/L 4.6 4.2 4.2  Chloride 98 - 111 mmol/L 105 101 105  CO2 22 - 32 mmol/L  '25 26 24  '$ Calcium 8.9 - 10.3 mg/dL 9.3 9.0 8.8(L)  Total Protein 6.5 - 8.1 g/dL 7.5 7.0 6.4(L)  Total Bilirubin 0.3 - 1.2 mg/dL 0.3 0.2(L) <0.2(L)  Alkaline Phos 38 - 126 U/L 110 93 79  AST 15 - 41 U/L '25 22 19  '$ ALT 0 - 44 U/L '24 26 16      '$ RADIOGRAPHIC STUDIES: I have personally reviewed the radiological images as listed and agreed with the findings in the report. No results found.   ASSESSMENT & PLAN:  Mario Mcdowell is a 68 y.o. male with   1. 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 He is s/phemicolectomy and treated with adjuvant FOLFOX.He is on surveillance now. -I personally reviewed and discussed his CT CAP from 09/08/18 with pt which showed consolidation in right upper lobe lung with mild right hilar adenopathy. This is suspicious for pneumonia but he is asymptomatic. His COVID-19 test was negative.  I discussed that malignancy, especially recurrent colon cancer in lung is also a possibility, will f/u closely. His recent CEA was slightly elevated  -Will give course of antibiotics today to cover atypical PNA. Will repeat CT chest in 1 month to determine if we need to biopsy this. He is agreeable.  -I encouraged  him to watch for persistent cough, fever or SOB.  -Labs from this week were normal. Continue surveillance.  -F/u in 4 weeks   2. Smoking cessation, Emphysema   -HepreviouslydeclinedNicotine patches or counseling to help him quit. -He is still smoking 1 pack or less a day. He has been trying to quit, but unsuccessful. -Iagaindiscussed smoking causes blockage of his blood vessels and stroke.   3. BPH -01/10/18 PSA normal at 0.84  -Will continue to be followed by his PCP.   4. History of kidney stones  5. PAD -PriorCT scan showed complete occlusion of his abdominal aorta. He is reluctant to proceed with surgery. His risk from infection s/p chemo is lower now. He is cleared to  proceed.  -He will contact hisvascular surgeon Dr. Nadyne Coombes he is ready to proceed  PLAN -I called in doxycycline '100mg'$  bid X10 days today  -F/u in 4 weeks with Lab and CT Chest a few days before    No problem-specific Assessment & Plan notes found for this encounter.   Orders Placed This Encounter  Procedures  . CT Chest Wo Contrast    F/u right lung lesion    Standing Status:   Future    Standing Expiration Date:   09/17/2019    Order Specific Question:   Preferred imaging location?    Answer:   [facility]    Order Specific Question:   Radiology Contrast Protocol - do NOT remove file path    Answer:   [email or web address]   I discussed the assessment and treatment plan with the patient. The patient was provided an opportunity to ask questions and all were answered. The patient agreed with the plan and demonstrated an understanding of the instructions.  The patient was advised to call back or seek an in-person evaluation if the symptoms worsen or if the condition fails to improve as anticipated.  I provided 15 minutes of non face-to-face telephone visit time during this encounter, and > 50% was spent counseling as documented under my assessment & plan.    Truitt Merle, MD 09/17/2018   I, Joslyn Devon, am acting as scribe for Truitt Merle, MD.   I have reviewed the above documentation for accuracy and completeness, and I agree with the above.

## 2018-09-15 NOTE — Telephone Encounter (Signed)
Confirmed 9/2 doximity visit with patient.

## 2018-09-16 ENCOUNTER — Telehealth: Payer: Self-pay | Admitting: Hematology

## 2018-09-16 LAB — NOVEL CORONAVIRUS, NAA: SARS-CoV-2, NAA: NOT DETECTED

## 2018-09-16 NOTE — Telephone Encounter (Signed)
Confirmed virtual appt and confirmed info

## 2018-09-17 ENCOUNTER — Inpatient Hospital Stay: Payer: Medicare Other | Attending: Hematology | Admitting: Hematology

## 2018-09-17 ENCOUNTER — Encounter: Payer: Self-pay | Admitting: Hematology

## 2018-09-17 DIAGNOSIS — C187 Malignant neoplasm of sigmoid colon: Secondary | ICD-10-CM | POA: Diagnosis not present

## 2018-09-17 DIAGNOSIS — C772 Secondary and unspecified malignant neoplasm of intra-abdominal lymph nodes: Secondary | ICD-10-CM | POA: Diagnosis not present

## 2018-09-17 MED ORDER — DOXYCYCLINE HYCLATE 100 MG PO TABS: 100.0000 mg | ORAL_TABLET | Freq: Two times a day (BID) | ORAL | 0 refills | Status: DC

## 2018-09-18 ENCOUNTER — Telehealth: Payer: Self-pay | Admitting: Hematology

## 2018-09-18 NOTE — Telephone Encounter (Signed)
Scheduled appt per 9/2 los.  A calendar with central radiology number will be mailed out

## 2018-10-16 ENCOUNTER — Other Ambulatory Visit: Payer: Self-pay | Admitting: Family Medicine

## 2018-10-16 NOTE — Progress Notes (Signed)
[place]   [phone] [phone]   Clinic Follow up Note   Patient Care Team: Ria Bush, MD as PCP - General (Family Medicine) Buford Dresser, MD as PCP - Cardiology (Cardiology) Ladene Artist, MD as Consulting Physician (Gastroenterology) [name], MD as Consulting Physician (General Surgery) Truitt Merle, MD as Consulting Physician (Hematology)  I connected with Sherri Rad on 10/20/2018 at 11:00 AM EDT by telephone visit and verified that I am speaking with the correct person using two identifiers.  I discussed the limitations, risks, security and privacy concerns of performing an evaluation and management service by telephone and the availability of in person appointments. I also discussed with the patient that there may be a patient responsible charge related to this service. The patient expressed understanding and agreed to proceed.    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 Resected and retrieved. - Rule out malignancy, tumor in the sigmoid colon. Biopsied. Tattooed. - Internal hemorrhoids. - The examination was otherwise normal on direct and retroflexion views.    03/14/2016 Imaging   CT A/P W CONTRAST  IMPRESSION: Short segment area of wall thickening in the distal sigmoid colon, suspicious for site of primary colon carcinoma.  Recommend correlation with colonoscopy results.   04/23/2016 Imaging   CT Chest W Contrast IMPRESSION: 1. No evidence of pulmonary metastasis. 2. Bilateral calcified pulmonary nodules and mediastinal lymph nodes consistent with granulomatous disease. 3. Centrilobular emphysema. 4. Coronary artery calcification and aortic atherosclerotic calcification.   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 Colon, segmental resection for tumor, sigmoid - INVASIVE ADENOCARCINOMA, WELL TO MODERATELY DIFFERENTIATED, WITH ABUNDANT MUCIN (PT3 PN1) - METASTATIC ADENOCARCINOMA IN ONE OF TWENTY-TWO LYMPH NODES (1/22) - TWO TUMOR DEPOSITS PRESENT - MARGINS UNINVOLVED BY CARCINOMA - HYPERPLASTIC POLYPS (X2) - SEE ONCOLOGY TABLE BELOW 2. Colon, resection margin (donut), final distal margin - BENIGN COLON - NO MALIGNANCY IDENTIFIED   05/21/2016 Surgery   LAPAROSCOPIC PARTIAL COLECTOMY, SPLENIC FLEXURE MOBILIZATION AND PROCTOSCOPY   06/25/2016 - 09/03/2016 Chemotherapy   FOLFOX every 2 weeks for 3 months    12/03/2016 Imaging   CT AP 12/03/16 IMPRESSION: Status post sigmoid resection. No evidence of recurrent or metastatic disease.   05/29/2017 Procedure   05/29/2017 Colonoscopy - One 7 mm polyp in the descending colon, removed with a cold snare. Resected and retrieved. - One 4 mm polyp in the transverse colon, removed with a cold biopsy forceps. Resected and retrieved. - Patent end-to-end colo-colonic anastomosis, characterized by healthy appearing mucosa. - Internal hemorrhoids. - The examination was otherwise normal on direct and retroflexion views.   05/29/2017 Pathology Results   05/29/2017 Surgical Pathology Diagnosis 1. Surgical [P], random sites - BENIGN COLONIC MUCOSA. - NO ACTIVE INFLAMMATION OR EVIDENCE OF MICROSCOPIC COLITIS. - NO DYSPLASIA  OR MALIGNANCY. 2. Surgical [P],  descending and transverse, polyp (2) - HYPERPLASTIC POLYP (X2 FRAGMENTS). - NO DYSPLASIA OR MALIGNANCY.   08/29/2017 Imaging   08/29/2017 CT CAP W Contrast IMPRESSION: 1. No findings of active malignancy. 2. 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 Chart review indicates that the patient was seen for this by Dr. Gae Gallop on 01/02/2017. 3. 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 4. Other imaging findings of potential clinical significance: Aortic Atherosclerosis (ICD10-I70.0) and Emphysema (ICD10-J43.9). Coronary atherosclerosis. 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 Small right scrotal hydrocele   09/08/2018 Imaging   CT CAP W Contrsat 09/08/18  IMPRESSION: 1. New areas of consolidated lung in the right upper lobe along with mild right hilar adenopathy. Cause for the consolidation is uncertain and by report the patient is without symptoms/complaints. Presumably this represents pneumonia, follow up chest radiography recommended to ensure clearance and exclude underlying malignancy. 2. 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 Aortic Atherosclerosis (ICD10-I70.0). Coronary atherosclerosis. 3. Other imaging findings of potential clinical significance: Emphysema (ICD10-J43.9). Old granulomatous disease. Borderline prominent common bile duct size at 0.7 cm in diameter.   10/17/2018 Imaging    CT Chest W contrast 10/17/18  IMPRESSION: Perhaps slight improvement in some areas of this right upper lobe process. Scarring from a recent episode of infection is considered. However, little change is seen within the short interval. Occult neoplasm in this area resulting in postobstructive changes should also be considered. Bronchoscopic assessment may be helpful.   No new areas of concern.   Aortic  Atherosclerosis (ICD10-I70.0) and Emphysema (ICD10-J43.9).      CURRENT THERAPY:  Surveillance  INTERVAL HISTORY:  MEKHI SONN is here for a follow up. He was able to identify himself by date of birth. He notes hs is doing well. He notes he took his antibiotics and has felt no change. He denies any change in baseline breathing except smokers cough in the morning. He notes he is still smoking same amount of cigarettes.    REVIEW OF SYSTEMS:   Constitutional: Denies fevers, chills or abnormal weight loss Eyes: Denies blurriness of vision Ears, nose, mouth, throat, and face: Denies mucositis or sore throat Respiratory: Denies cough, dyspnea or wheezes Cardiovascular: Denies palpitation, chest discomfort or lower extremity swelling Gastrointestinal:  Denies nausea, heartburn or change in bowel habits Skin: Denies abnormal skin rashes Lymphatics: Denies new lymphadenopathy or easy bruising Neurological:Denies numbness, tingling or new weaknesses Behavioral/Psych: Mood is stable, no new changes  All other systems were reviewed with the patient and are negative.  MEDICAL HISTORY:  Past Medical History:  Diagnosis Date  . Aorto-iliac atherosclerosis ([place])    by CT; in bilateral legs   . Arthritis   . Atherosclerotic PVD with intermittent claudication ([place])   . BPH without obstruction/lower urinary tract symptoms 10/25/2015  . Centrilobular emphysema ([place])    per chest CT 04-23-2016  . Colon adenocarcinoma [facility]) oncologist- dr Burr Medico   dx 05-21-2016 -- Stage IIIB (pT3,pN1,M0) , Invasive sigmoid adenocarcinoma well to moderately differentiated with metastatic intra-abdominal lymph node (1 out of 22), s/p lap. partial colectomy [name]) 05/21/2016 and chemotherapy is planned  . Full dentures   . History of kidney stones 07/2015   passed without intervention  . Pulmonary nodules    per chest CT 04-23-2016 bilateral nodules (consistent w/ granulomatous disease)  . Smokers' cough ([place])    .  Wears contact lenses     SURGICAL HISTORY: Past Surgical History:  Procedure Laterality Date  . COLONOSCOPY  03/12/2016   4 polyps, sigmoid tumor biopsied (TVA w/ MICROSCOPIC FOCUS SUSPICIOUS FOR INVASION) pending surgical resection, rpt 1 yr [facility] Plan)  . COLONOSCOPY  05/2017   2 benign polyps, rpt 2 yrs [facility] Plan)  . LAPAROSCOPIC PARTIAL COLECTOMY N/A 05/21/2016   colon cancer, 1/20 positive lymp nodes. LAPAROSCOPIC PARTIAL COLECTOMY, SPLENIC FLEXURE MOBILIZATION AND PROCTOSCOPY;  [name], MD)  . MULTIPLE TOOTH EXTRACTIONS  1980's  . PORT-A-CATH REMOVAL Right 10/04/2016   Procedure: REMOVAL PORT-A-CATH;  Surgeon: [name], MD;  Location[facility];  Service: General;  Laterality: Right;  . PORTACATH PLACEMENT N/A 06/21/2016   Procedure: INSERTION PORT-A-CATH;  Surgeon: [name], MD;  Location: [facility];  Service: General;  Laterality: N/A;  right    I have reviewed the social history and family history with the patient and they are unchanged from previous note.  ALLERGIES:  is allergic to penicillins and lipitor [atorvastatin].  MEDICATIONS:  Current Outpatient Medications  Medication Sig Dispense Refill  . aspirin EC 81 MG tablet Take 81 mg by mouth daily.    Marland Kitchen doxycycline (VIBRA-TABS) 100 MG tablet Take 1 tablet (100 mg total) by mouth 2 (two) times daily. 20 tablet 0  . rosuvastatin (CRESTOR) 5 MG tablet Take 1 tablet (5 mg total) by mouth daily. 30 tablet   . tiZANidine (ZANAFLEX) 4 MG tablet TAKE 1 TABLET BY MOUTH TWICE DAILY AS NEEDED FOR MUSCLE SPASMS (SEDATION PRECAUTIONS) 30 tablet 0   Current Facility-Administered Medications  Medication Dose Route Frequency Provider Last Rate Last Dose  . 0.9 %  sodium chloride infusion  500 mL Intravenous Once Ladene Artist, MD        PHYSICAL EXAMINATION: ECOG PERFORMANCE STATUS: 0 - Asymptomatic  No vitals taken today, Exam not performed today  LABORATORY DATA:  I have  reviewed the data as listed CBC Latest Ref Rng & Units 10/17/2018 09/08/2018 06/12/2018  WBC 4.0 - 10.5 K/uL 10.6(H) 7.9 10.7(H)  Hemoglobin 13.0 - 17.0 g/dL 14.0 15.6 13.2  Hematocrit 39.0 - 52.0 % 41.5 46.9 39.1  Platelets 150 - 400 K/uL 189 197 267     CMP Latest Ref Rng & Units 10/17/2018 09/08/2018 06/12/2018  Glucose 70 - 99 mg/dL 108(H) 91 90  BUN 8 - 23 mg/dL '14 12 15  '$ Creatinine 0.61 - 1.24 mg/dL 0.81 0.85 0.81  Sodium 135 - 145 mmol/L 140 140 136  Potassium 3.5 - 5.1 mmol/L 4.2 4.6 4.2  Chloride 98 - 111 mmol/L 106 105 101  CO2 22 - 32 mmol/L '24 25 26  '$ Calcium 8.9 - 10.3 mg/dL 9.1 9.3 9.0  Total Protein 6.5 - 8.1 g/dL 7.1 7.5 7.0  Total Bilirubin 0.3 - 1.2 mg/dL <0.2(L) 0.3 0.2(L)  Alkaline Phos 38 - 126 U/L 96 110 93  AST 15 - 41 U/L '23 25 22  '$ ALT 0 - 44 U/L '23 24 26      '$ RADIOGRAPHIC STUDIES: I have personally reviewed the radiological images as listed and agreed with the findings in the report. No results found.   ASSESSMENT & PLAN:  Mario Mcdowell is a 68 y.o. male with   1. Right upper lobe lung opacification  -found on routine surveillance CT scan for his colon cancer on September 05, 2018. -He was asymptomatic.  I gave him a course of doxycycline. -I personally reviewed and discussed his repeated  CT scan from October 17, 2018, which showed persistent right upper lobe two opacification with areas of cavitation, 5.4 x 4.0 cm, and 3.0X1.7cm, slightly smaller compared to last scan. -Patient is a heavy smoker, still actively smoking, he is at high risk for lung cancer. -I will refer him to pulmonary for bronchoscopy and biopsy to rule out malignancy. He agrees.   2. 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 He is s/phemicolectomy and treated with adjuvant FOLFOX.He is on surveillance now. -His CT CAP from 09/08/18 with pt which showed consolidation in right upper lobe lung with mild right  hilar adenopathy.  -We discussed CT Chest from 10/17/18 to follow his lung consolidation. Scan showed slight improvement in some areas of right upper lobe. Given little change, this is concerning for possible malignancy given smoking history.  -I recommend he see pulmonologist at [facility] and a lung biopsy to better evaluate. He is agreeable.  -f/u open   3. Smoking cessation, Emphysema   -HepreviouslydeclinedNicotine patches or counseling to help him quit. -He is still smoking 1 pack or less a day. He has been trying to quit, but unsuccessful. -I againdiscussed smoking cessation as his emphysema can worsen and increases his risk of lung cancer.  -I discussed he should quit smoking as soon as possible. He is willing to try.   4. BPH -12/27/19PSA normal at 0.84 -Will continue to be followed by his PCP.   5. History of kidney stones  6. PAD -PriorCT scan showed complete occlusion of his abdominal aorta. He is reluctant to proceed with surgery. His risk from infection s/p chemo is lower now. He is cleared to proceed.  -He will contact hisvascular surgeon Dr. Nadyne Coombes he is ready to proceed  PLAN -Refer to Pulmonologist Dr. Valeta Harms at [facility] for evaluation and possible bronchoscopy and lung biopsy  -F/u open for now   No problem-specific Assessment & Plan notes found for this encounter.   Orders Placed This Encounter  Procedures  . Ambulatory referral to Pulmonology    Referral Priority:   Routine    Referral Type:   Consultation    Referral Reason:   Specialty Services Required    Requested Specialty:   Pulmonary Disease    Number of Visits Requested:   1   I discussed the assessment and treatment plan with the patient. The patient was provided an opportunity to ask questions and all were answered. The patient agreed with the plan and demonstrated an understanding of the instructions.  The patient was advised to call back or seek an in-person evaluation if the symptoms  worsen or if the condition fails to improve as anticipated.  I provided 15 minutes of non face-to-face telephone visit time during this encounter, and > 50% was spent counseling as documented under my assessment & plan.     Truitt Merle, MD 10/20/2018   I, Joslyn Devon, am acting as scribe for Truitt Merle, MD.   I have reviewed the above documentation for accuracy and completeness, and I agree with the above.

## 2018-10-17 ENCOUNTER — Encounter (HOSPITAL_COMMUNITY): Payer: Self-pay

## 2018-10-17 ENCOUNTER — Telehealth: Payer: Self-pay | Admitting: Hematology

## 2018-10-17 ENCOUNTER — Inpatient Hospital Stay: Payer: Medicare Other | Attending: Hematology

## 2018-10-17 ENCOUNTER — Other Ambulatory Visit: Payer: Self-pay

## 2018-10-17 ENCOUNTER — Ambulatory Visit (HOSPITAL_COMMUNITY)
Admission: RE | Admit: 2018-10-17 | Discharge: 2018-10-17 | Disposition: A | Discharge status: Home / Self Care | Payer: Medicare Other | Source: Ambulatory Visit | Attending: Hematology | Admitting: Hematology

## 2018-10-17 DIAGNOSIS — C187 Malignant neoplasm of sigmoid colon: Secondary | ICD-10-CM | POA: Insufficient documentation

## 2018-10-17 DIAGNOSIS — C189 Malignant neoplasm of colon, unspecified: Secondary | ICD-10-CM | POA: Diagnosis not present

## 2018-10-17 DIAGNOSIS — C772 Secondary and unspecified malignant neoplasm of intra-abdominal lymph nodes: Secondary | ICD-10-CM | POA: Diagnosis not present

## 2018-10-17 DIAGNOSIS — J439 Emphysema, unspecified: Secondary | ICD-10-CM | POA: Diagnosis not present

## 2018-10-17 LAB — CBC WITH DIFFERENTIAL/PLATELET
Abs Immature Granulocytes: 0.03 10*3/uL (ref 0.00–0.07)
Basophils Absolute: 0.1 10*3/uL (ref 0.0–0.1)
Basophils Relative: 1 %
Eosinophils Absolute: 0.2 10*3/uL (ref 0.0–0.5)
Eosinophils Relative: 1 %
HCT: 41.5 % (ref 39.0–52.0)
Hemoglobin: 14 g/dL (ref 13.0–17.0)
Immature Granulocytes: 0 %
Lymphocytes Relative: 30 %
Lymphs Abs: 3.1 10*3/uL (ref 0.7–4.0)
MCH: 31 pg (ref 26.0–34.0)
MCHC: 33.7 g/dL (ref 30.0–36.0)
MCV: 91.8 fL (ref 80.0–100.0)
Monocytes Absolute: 0.7 10*3/uL (ref 0.1–1.0)
Monocytes Relative: 7 %
Neutro Abs: 6.5 10*3/uL (ref 1.7–7.7)
Neutrophils Relative %: 61 %
Platelets: 189 10*3/uL (ref 150–400)
RBC: 4.52 MIL/uL (ref 4.22–5.81)
RDW: 16.1 % — ABNORMAL HIGH (ref 11.5–15.5)
WBC: 10.6 10*3/uL — ABNORMAL HIGH (ref 4.0–10.5)
nRBC: 0 % (ref 0.0–0.2)

## 2018-10-17 LAB — COMPREHENSIVE METABOLIC PANEL
ALT: 23 U/L (ref 0–44)
AST: 23 U/L (ref 15–41)
Albumin: 3.8 g/dL (ref 3.5–5.0)
Alkaline Phosphatase: 96 U/L (ref 38–126)
Anion gap: 10 (ref 5–15)
BUN: 14 mg/dL (ref 8–23)
CO2: 24 mmol/L (ref 22–32)
Calcium: 9.1 mg/dL (ref 8.9–10.3)
Chloride: 106 mmol/L (ref 98–111)
Creatinine, Ser: 0.81 mg/dL (ref 0.61–1.24)
GFR calc Af Amer: >60 mL/min (ref >60)
GFR calc non Af Amer: >60 mL/min (ref >60)
Glucose, Bld: 108 mg/dL — ABNORMAL HIGH (ref 70–99)
Potassium: 4.2 mmol/L (ref 3.5–5.1)
Sodium: 140 mmol/L (ref 135–145)
Total Bilirubin: <0.2 mg/dL — ABNORMAL LOW (ref 0.3–1.2)
Total Protein: 7.1 g/dL (ref 6.5–8.1)

## 2018-10-17 NOTE — Telephone Encounter (Signed)
Tizanidine Last filled:  09/07/18, #30 Last OV:  06/20/18, acute- leg cramps Next OV:  01/19/19, CPE prt 2

## 2018-10-17 NOTE — Telephone Encounter (Signed)
Confirmed telephone encounter for 10/5 and verified pt's demographics

## 2018-10-20 ENCOUNTER — Inpatient Hospital Stay (HOSPITAL_BASED_OUTPATIENT_CLINIC_OR_DEPARTMENT_OTHER): Payer: Medicare Other | Admitting: Hematology

## 2018-10-20 DIAGNOSIS — C187 Malignant neoplasm of sigmoid colon: Secondary | ICD-10-CM

## 2018-10-20 DIAGNOSIS — C772 Secondary and unspecified malignant neoplasm of intra-abdominal lymph nodes: Secondary | ICD-10-CM

## 2018-10-21 ENCOUNTER — Encounter: Payer: Self-pay | Admitting: Hematology

## 2018-10-21 ENCOUNTER — Telehealth: Payer: Self-pay | Admitting: Hematology

## 2018-10-21 NOTE — Telephone Encounter (Signed)
Per 10/5 los f/u open.

## 2018-10-27 ENCOUNTER — Telehealth: Payer: Self-pay

## 2018-10-27 NOTE — Telephone Encounter (Signed)
Spoke with Mario Mcdowell at Dr. Juline Patch office about referral for bronchoscopy and biopsy.  She will call the patient today to schedule.  Notified the patient.

## 2018-10-28 ENCOUNTER — Telehealth: Payer: Self-pay

## 2018-10-28 NOTE — Telephone Encounter (Signed)
-----   Message from Garner Nash, DO sent at 10/27/2018  3:49 PM EDT -----  [place],  He needs an appt with me Next available. Bronch eval  Upper lobe lesion  Referral from Dr. Kelli Hope   ----- Message ----- From: Truitt Merle, MD Sent: 10/21/2018   4:50 PM EDT To: Garner Nash, DO  Leory Plowman,  I referred this nice guy to you yesterday. He is 2.5 years out of his stage III colon cancer diagnosis. He was found to have large opacity in RUL on a routine surveillance CT scan in August, he was asymptomatic.  I treated her with antibiotics.  Repeat a scan last week showed a persistent opacity.  He is a heavy smoker, still actively smoking.  Let me know if you can do bronchoscopy and biopsy for hm.   Thanks much,  Krista Blue

## 2018-10-30 ENCOUNTER — Ambulatory Visit (INDEPENDENT_AMBULATORY_CARE_PROVIDER_SITE_OTHER): Payer: Medicare Other

## 2018-10-30 DIAGNOSIS — Z23 Encounter for immunization: Secondary | ICD-10-CM

## 2018-11-03 ENCOUNTER — Ambulatory Visit (INDEPENDENT_AMBULATORY_CARE_PROVIDER_SITE_OTHER): Payer: Medicare Other | Admitting: Pulmonary Disease

## 2018-11-03 ENCOUNTER — Encounter: Payer: Self-pay | Admitting: Pulmonary Disease

## 2018-11-03 ENCOUNTER — Other Ambulatory Visit: Payer: Self-pay

## 2018-11-03 VITALS — BP 122/60 | HR 83 | Temp 98.6°F | Ht 64.0 in | Wt 122.6 lb

## 2018-11-03 DIAGNOSIS — F17210 Nicotine dependence, cigarettes, uncomplicated: Secondary | ICD-10-CM | POA: Diagnosis not present

## 2018-11-03 DIAGNOSIS — R918 Other nonspecific abnormal finding of lung field: Secondary | ICD-10-CM

## 2018-11-03 DIAGNOSIS — Z720 Tobacco use: Secondary | ICD-10-CM

## 2018-11-03 MED ORDER — ALBUTEROL SULFATE HFA 108 (90 BASE) MCG/ACT IN AERS: 1.0000 | INHALATION_SPRAY | RESPIRATORY_TRACT | 6 refills | Status: DC | PRN

## 2018-11-03 NOTE — Patient Instructions (Addendum)
Based on imaging, lung mass is highly concerning for malignancy. May potentially represent infection. We discussed diagnostic testing including CT-guided biopsy, bronchoscopy and surgery vs conservative management with further imaging. After addressing patient/family's questions, patient wishes to pursue diagnostic testing via bronchoscopy. We discussed risks and benefits of procedure including infection, bleeding and lung collapse. Patient agrees procedure.   --Scheduled to discuss procedural visit at 1:30 PM on 10/20 --Will defer bronchoscopy scheduling to Dr. Valeta Harms --Will schedule PFTs pending bronch

## 2018-11-03 NOTE — H&P (View-Only) (Signed)
Subjective:   PATIENT ID: Mario Mcdowell GENDER: male DOB: 1950/06/11, MRN: [id number]   HPI  Chief Complaint  Patient presents with  . Pulmonary Consult    referred by Dr. Burr Medico for abnormal CT scan - RUL lesion    Reason for Visit: New consult for right upper lobe lung mass  [name] is a 68 year old male current smoker with stage IIIB colon cancer s/p hemicolectomy s/p FOLFOX in remission who presents for new lung mass.  Overall, he reports he is feeling well and acknowledges that he needs to cut back on smoking. He is aware that Dr. Burr Medico referred him to Pulmonary clinic for a "spot" on his lung. He has a history of colon cancer that was diagnosed in 05/2016 s/p hemicolectomy and FOLFOX and currently on surveillance. On 09/08/18 imaging, a dense right upper lobe consolidation was seen and treated with a 10 day course of doxycyline. On repeat CT 10/17/18, this consolidation only slightly improved and he was referred to Pulmonary for further evaluation.  He has daily congestion with productive cough throughout the day. Denies hemoptysis, shortness breath or wheezing. He reports he is fairly active and able to perform household chores and yardwork without limitations. He actively smokes 1ppd and will smoke more when he is stressed or upset. He usually smokes when he first gets up in the morning, after meals and when driving. He is interested in trying to pouches. He expresses concern about using patches due to cardiac side effects. He denies recent weight loss, night sweats, unexplained fevers or chills.   Of note, he has significant peripheral vascular disease including chronic aortoiliac occlusion associated with claudication and left carotid stenosis. He was previously considered for revascularization per last Vascular note in 11/2017 however has not followed up.  Social History: Active smoker  Environmental exposures:  [place] Denies recent travel, exposure to individuals with  homelessness or jail/prison  I have personally reviewed patient's past medical/family/social history, allergies, current medications.  Past Medical History:  Diagnosis Date  . Aorto-iliac atherosclerosis ([place])    by CT; in bilateral legs   . Arthritis   . Atherosclerotic PVD with intermittent claudication ([place])   . BPH without obstruction/lower urinary tract symptoms 10/25/2015  . Centrilobular emphysema ([place])    per chest CT 04-23-2016  . Colon adenocarcinoma [facility]) oncologist- dr Burr Medico   dx 05-21-2016 -- Stage IIIB (pT3,pN1,M0) , Invasive sigmoid adenocarcinoma well to moderately differentiated with metastatic intra-abdominal lymph node (1 out of 22), s/p lap. partial colectomy Mario Mcdowell) 05/21/2016 and chemotherapy is planned  . Full dentures   . History of kidney stones 07/2015   passed without intervention  . Pulmonary nodules    per chest CT 04-23-2016 bilateral nodules (consistent w/ granulomatous disease)  . Smokers' cough ([place])   . Wears contact lenses      Family History  Problem Relation Age of Onset  . Cancer Paternal Aunt        unsure details  . Diabetes Cousin   . Cancer Paternal Uncle        unknow type cancer   . Prostate cancer Neg Hx   . Kidney cancer Neg Hx   . CAD Neg Hx   . Stroke Neg Hx   . Colon cancer Neg Hx      Social History   Occupational History  . Not on file  Tobacco Use  . Smoking status: Current Every Day Smoker    Packs/day: 1.00  Years: 46.00    Pack years: 46.00    Types: Cigarettes  . Smokeless tobacco: Never Used  Substance and Sexual Activity  . Alcohol use: No    Comment: quit 2000  . Drug use: No  . Sexual activity: Not Currently    Birth control/protection: Abstinence    Allergies  Allergen Reactions  . Penicillins Swelling    Has patient had a PCN reaction causing immediate rash, facial/tongue/throat swelling, SOB or lightheadedness with hypotension: Yes Has patient had a PCN reaction causing severe rash involving  mucus membranes or skin necrosis: No Has patient had a PCN reaction that required hospitalization No Has patient had a PCN reaction occurring within the last 10 years: No If all of the above answers are "NO", then may proceed with Cephalosporin use.   . Lipitor [Atorvastatin] Nausea Only     Outpatient Medications Prior to Visit  Medication Sig Dispense Refill  . aspirin EC 81 MG tablet Take 81 mg by mouth daily.    . rosuvastatin (CRESTOR) 5 MG tablet Take 1 tablet (5 mg total) by mouth daily. 30 tablet   . tiZANidine (ZANAFLEX) 4 MG tablet TAKE 1 TABLET BY MOUTH TWICE DAILY AS NEEDED FOR MUSCLE SPASMS (SEDATION PRECAUTIONS) 30 tablet 0  . doxycycline (VIBRA-TABS) 100 MG tablet Take 1 tablet (100 mg total) by mouth 2 (two) times daily. 20 tablet 0   Facility-Administered Medications Prior to Visit  Medication Dose Route Frequency Provider Last Rate Last Dose  . 0.9 %  sodium chloride infusion  500 mL Intravenous Once Ladene Artist, MD        Review of Systems  Constitutional: Negative for chills, diaphoresis, fever, malaise/fatigue and weight loss.  HENT: Positive for congestion. Negative for ear pain and sore throat.   Respiratory: Positive for cough and sputum production. Negative for hemoptysis, shortness of breath and wheezing.   Cardiovascular: Negative for chest pain, palpitations and leg swelling.  Gastrointestinal: Negative for abdominal pain, heartburn and nausea.  Genitourinary: Negative for frequency.  Musculoskeletal: Negative for joint pain and myalgias.  Skin: Negative for itching and rash.  Neurological: Negative for dizziness, weakness and headaches.  Endo/Heme/Allergies: Does not bruise/bleed easily.  Psychiatric/Behavioral: Negative for depression. The patient is not nervous/anxious.     Objective:   Vitals:   11/03/18 1333  BP: 122/60  Pulse: 83  Temp: 98.6 F (37 C)  TempSrc: Temporal  SpO2: 98%  Weight: 122 lb 9.6 oz (55.6 kg)  Height: 5\' 4"   (1.626 m)   SpO2: 98 % O2 Device: None (Room air)  Physical Exam: General: Well-appearing, no acute distress HENT: [place], AT, OP clear, MMM Eyes: EOMI, no scleral icterus Lymph: no cervical lymphadenopathy Respiratory: Clear to auscultation bilaterally.  No crackles, wheezing or rales Cardiovascular: RRR, -M/R/G, no JVD GI: BS+, soft, nontender Extremities:-Edema,-tenderness Neuro: AAO x4, CNII-XII grossly intact Skin: Intact, no rashes or bruising Psych: Normal mood, normal affect  Data Reviewed:  Imaging: CT Chest 10/17/18 - 5x4cm and 3x2cm lung masses, slightly improved compared to prior imaging. Background emphysema.  PFT: None on file  Labs: CBC    Component Value Date/Time   WBC 10.6 (H) 10/17/2018 1100   RBC 4.52 10/17/2018 1100   HGB 14.0 10/17/2018 1100   HGB 14.9 12/03/2016 0748   HCT 41.5 10/17/2018 1100   HCT 44.3 12/03/2016 0748   PLT 189 10/17/2018 1100   PLT 178 12/03/2016 0748   MCV 91.8 10/17/2018 1100   MCV 97.5 12/03/2016 0748  MCH 31.0 10/17/2018 1100   MCHC 33.7 10/17/2018 1100   RDW 16.1 (H) 10/17/2018 1100   RDW 12.7 12/03/2016 0748   LYMPHSABS 3.1 10/17/2018 1100   LYMPHSABS 3.5 (H) 12/03/2016 0748   MONOABS 0.7 10/17/2018 1100   MONOABS 0.5 12/03/2016 0748   EOSABS 0.2 10/17/2018 1100   EOSABS 0.1 12/03/2016 0748   BASOSABS 0.1 10/17/2018 1100   BASOSABS 0.0 12/03/2016 0748   BMET    Component Value Date/Time   NA 140 10/17/2018 1100   NA 139 12/03/2016 0748   K 4.2 10/17/2018 1100   K 4.4 12/03/2016 0748   CL 106 10/17/2018 1100   CO2 24 10/17/2018 1100   CO2 26 12/03/2016 0748   GLUCOSE 108 (H) 10/17/2018 1100   GLUCOSE 108 12/03/2016 0748   BUN 14 10/17/2018 1100   BUN 14.4 12/03/2016 0748   CREATININE 0.81 10/17/2018 1100   CREATININE 0.81 06/12/2018 1320   CREATININE 0.8 12/03/2016 0748   CALCIUM 9.1 10/17/2018 1100   CALCIUM 9.5 12/03/2016 0748   GFRNONAA >60 10/17/2018 1100   GFRNONAA >60 06/12/2018 1320   GFRAA  >60 10/17/2018 1100   GFRAA >60 06/12/2018 1320   Imaging, labs and tests noted above have been reviewed independently by me.    Assessment & Plan:   Discussion: 69 year male active smoker with hx of adenocarcinoma of the sigmoid colon s/p hemicolectomy and FOLFOX currently in remission. Surveillance imaging with interval development of 5x4cm and 3x2 lung masses in the RUL since 08/2018. Review of 08/29/17 and 04/23/16 CT Chest demonstrate apical scarring in the RUL area however recent findings have more extensive involvement concerning for malignancy and/or atypical infection.  Lung masses We discussed diagnostic testing including CT-guided biopsy, bronchoscopy and surgery vs conservative management with further imaging. After addressing patient/family's questions, patient wishes to pursue diagnostic testing via bronchoscopy. We discussed risks and benefits of procedure including infection, bleeding and lung collapse. Patient agrees procedure.   --Scheduled to discuss procedural visit at 1:30 PM on 10/20 --Will defer bronchoscopy scheduling to Dr. Valeta Harms --Will schedule PFTs pending bronch  Tobacco abuse Patient is an active smoker. We discussed smoking cessation for 8  minutes. We discussed triggers and stressors and ways to deal with them. We discussed barriers to continued smoking and benefits of smoking cessation. Provided patient with information cessation techniques and interventions.  Health Maintenance Immunization History  Administered Date(s) Administered  . Fluad Quad(high Dose 65+) 10/30/2018  . Influenza,inj,Quad PF,6+ Mos 12/05/2016, 12/19/2017   No orders of the defined types were placed in this encounter.  Meds ordered this encounter  Medications  . albuterol (VENTOLIN HFA) 108 (90 Base) MCG/ACT inhaler    Sig: Inhale 1-2 puffs into the lungs every 4 (four) hours as needed for wheezing or shortness of breath.    Dispense:  6.7 g    Refill:  6   No follow-ups on file.   [place], MD [facility] 11/03/2018 2:04 PM  Office Number [phone]

## 2018-11-03 NOTE — Progress Notes (Signed)
Subjective:   PATIENT ID: Mario Mcdowell GENDER: male DOB: 1950/11/03, MRN: [id number]   HPI  Chief Complaint  Patient presents with  . Pulmonary Consult    referred by Dr. Burr Medico for abnormal CT scan - RUL lesion    Reason for Visit: New consult for right upper lobe lung mass  Mario Mcdowell is a 68 year old male current smoker with stage IIIB colon cancer s/p hemicolectomy s/p FOLFOX in remission who presents for new lung mass.  Overall, he reports he is feeling well and acknowledges that he needs to cut back on smoking. He is aware that Dr. Burr Medico referred him to Pulmonary clinic for a "spot" on his lung. He has a history of colon cancer that was diagnosed in 05/2016 s/p hemicolectomy and FOLFOX and currently on surveillance. On 09/08/18 imaging, a dense right upper lobe consolidation was seen and treated with a 10 day course of doxycyline. On repeat CT 10/17/18, this consolidation only slightly improved and he was referred to Pulmonary for further evaluation.  He has daily congestion with productive cough throughout the day. Denies hemoptysis, shortness breath or wheezing. He reports he is fairly active and able to perform household chores and yardwork without limitations. He actively smokes 1ppd and will smoke more when he is stressed or upset. He usually smokes when he first gets up in the morning, after meals and when driving. He is interested in trying to pouches. He expresses concern about using patches due to cardiac side effects. He denies recent weight loss, night sweats, unexplained fevers or chills.   Of note, he has significant peripheral vascular disease including chronic aortoiliac occlusion associated with claudication and left carotid stenosis. He was previously considered for revascularization per last Vascular note in 11/2017 however has not followed up.  Social History: Active smoker  Environmental exposures:  [place] Denies recent travel, exposure to individuals with  homelessness or jail/prison  I have personally reviewed patient's past medical/family/social history, allergies, current medications.  Past Medical History:  Diagnosis Date  . Aorto-iliac atherosclerosis ([place])    by CT; in bilateral legs   . Arthritis   . Atherosclerotic PVD with intermittent claudication ([place])   . BPH without obstruction/lower urinary tract symptoms 10/25/2015  . Centrilobular emphysema ([place])    per chest CT 04-23-2016  . Colon adenocarcinoma [facility]) oncologist- dr Burr Medico   dx 05-21-2016 -- Stage IIIB (pT3,pN1,M0) , Invasive sigmoid adenocarcinoma well to moderately differentiated with metastatic intra-abdominal lymph node (1 out of 22), s/p lap. partial colectomy Mario Mcdowell) 05/21/2016 and chemotherapy is planned  . Full dentures   . History of kidney stones 07/2015   passed without intervention  . Pulmonary nodules    per chest CT 04-23-2016 bilateral nodules (consistent w/ granulomatous disease)  . Smokers' cough ([place])   . Wears contact lenses      Family History  Problem Relation Age of Onset  . Cancer Paternal Aunt        unsure details  . Diabetes Cousin   . Cancer Paternal Uncle        unknow type cancer   . Prostate cancer Neg Hx   . Kidney cancer Neg Hx   . CAD Neg Hx   . Stroke Neg Hx   . Colon cancer Neg Hx      Social History   Occupational History  . Not on file  Tobacco Use  . Smoking status: Current Every Day Smoker    Packs/day: 1.00  Years: 46.00    Pack years: 46.00    Types: Cigarettes  . Smokeless tobacco: Never Used  Substance and Sexual Activity  . Alcohol use: No    Comment: quit 2000  . Drug use: No  . Sexual activity: Not Currently    Birth control/protection: Abstinence    Allergies  Allergen Reactions  . Penicillins Swelling    Has patient had a PCN reaction causing immediate rash, facial/tongue/throat swelling, SOB or lightheadedness with hypotension: Yes Has patient had a PCN reaction causing severe rash involving  mucus membranes or skin necrosis: No Has patient had a PCN reaction that required hospitalization No Has patient had a PCN reaction occurring within the last 10 years: No If all of the above answers are "NO", then may proceed with Cephalosporin use.   . Lipitor [Atorvastatin] Nausea Only     Outpatient Medications Prior to Visit  Medication Sig Dispense Refill  . aspirin EC 81 MG tablet Take 81 mg by mouth daily.    . rosuvastatin (CRESTOR) 5 MG tablet Take 1 tablet (5 mg total) by mouth daily. 30 tablet   . tiZANidine (ZANAFLEX) 4 MG tablet TAKE 1 TABLET BY MOUTH TWICE DAILY AS NEEDED FOR MUSCLE SPASMS (SEDATION PRECAUTIONS) 30 tablet 0  . doxycycline (VIBRA-TABS) 100 MG tablet Take 1 tablet (100 mg total) by mouth 2 (two) times daily. 20 tablet 0   Facility-Administered Medications Prior to Visit  Medication Dose Route Frequency Provider Last Rate Last Dose  . 0.9 %  sodium chloride infusion  500 mL Intravenous Once Ladene Artist, MD        Review of Systems  Constitutional: Negative for chills, diaphoresis, fever, malaise/fatigue and weight loss.  HENT: Positive for congestion. Negative for ear pain and sore throat.   Respiratory: Positive for cough and sputum production. Negative for hemoptysis, shortness of breath and wheezing.   Cardiovascular: Negative for chest pain, palpitations and leg swelling.  Gastrointestinal: Negative for abdominal pain, heartburn and nausea.  Genitourinary: Negative for frequency.  Musculoskeletal: Negative for joint pain and myalgias.  Skin: Negative for itching and rash.  Neurological: Negative for dizziness, weakness and headaches.  Endo/Heme/Allergies: Does not bruise/bleed easily.  Psychiatric/Behavioral: Negative for depression. The patient is not nervous/anxious.     Objective:   Vitals:   11/03/18 1333  BP: 122/60  Pulse: 83  Temp: 98.6 F (37 C)  TempSrc: Temporal  SpO2: 98%  Weight: 122 lb 9.6 oz (55.6 kg)  Height: 5\' 4"   (1.626 m)   SpO2: 98 % O2 Device: None (Room air)  Physical Exam: General: Well-appearing, no acute distress HENT: [place], AT, OP clear, MMM Eyes: EOMI, no scleral icterus Lymph: no cervical lymphadenopathy Respiratory: Clear to auscultation bilaterally.  No crackles, wheezing or rales Cardiovascular: RRR, -M/R/G, no JVD GI: BS+, soft, nontender Extremities:-Edema,-tenderness Neuro: AAO x4, CNII-XII grossly intact Skin: Intact, no rashes or bruising Psych: Normal mood, normal affect  Data Reviewed:  Imaging: CT Chest 10/17/18 - 5x4cm and 3x2cm lung masses, slightly improved compared to prior imaging. Background emphysema.  PFT: None on file  Labs: CBC    Component Value Date/Time   WBC 10.6 (H) 10/17/2018 1100   RBC 4.52 10/17/2018 1100   HGB 14.0 10/17/2018 1100   HGB 14.9 12/03/2016 0748   HCT 41.5 10/17/2018 1100   HCT 44.3 12/03/2016 0748   PLT 189 10/17/2018 1100   PLT 178 12/03/2016 0748   MCV 91.8 10/17/2018 1100   MCV 97.5 12/03/2016 0748  MCH 31.0 10/17/2018 1100   MCHC 33.7 10/17/2018 1100   RDW 16.1 (H) 10/17/2018 1100   RDW 12.7 12/03/2016 0748   LYMPHSABS 3.1 10/17/2018 1100   LYMPHSABS 3.5 (H) 12/03/2016 0748   MONOABS 0.7 10/17/2018 1100   MONOABS 0.5 12/03/2016 0748   EOSABS 0.2 10/17/2018 1100   EOSABS 0.1 12/03/2016 0748   BASOSABS 0.1 10/17/2018 1100   BASOSABS 0.0 12/03/2016 0748   BMET    Component Value Date/Time   NA 140 10/17/2018 1100   NA 139 12/03/2016 0748   K 4.2 10/17/2018 1100   K 4.4 12/03/2016 0748   CL 106 10/17/2018 1100   CO2 24 10/17/2018 1100   CO2 26 12/03/2016 0748   GLUCOSE 108 (H) 10/17/2018 1100   GLUCOSE 108 12/03/2016 0748   BUN 14 10/17/2018 1100   BUN 14.4 12/03/2016 0748   CREATININE 0.81 10/17/2018 1100   CREATININE 0.81 06/12/2018 1320   CREATININE 0.8 12/03/2016 0748   CALCIUM 9.1 10/17/2018 1100   CALCIUM 9.5 12/03/2016 0748   GFRNONAA >60 10/17/2018 1100   GFRNONAA >60 06/12/2018 1320   GFRAA  >60 10/17/2018 1100   GFRAA >60 06/12/2018 1320   Imaging, labs and tests noted above have been reviewed independently by me.    Assessment & Plan:   Discussion: 68 year male active smoker with hx of adenocarcinoma of the sigmoid colon s/p hemicolectomy and FOLFOX currently in remission. Surveillance imaging with interval development of 5x4cm and 3x2 lung masses in the RUL since 08/2018. Review of 08/29/17 and 04/23/16 CT Chest demonstrate apical scarring in the RUL area however recent findings have more extensive involvement concerning for malignancy and/or atypical infection.  Lung masses We discussed diagnostic testing including CT-guided biopsy, bronchoscopy and surgery vs conservative management with further imaging. After addressing patient/family's questions, patient wishes to pursue diagnostic testing via bronchoscopy. We discussed risks and benefits of procedure including infection, bleeding and lung collapse. Patient agrees procedure.   --Scheduled to discuss procedural visit at 1:30 PM on 10/20 --Will defer bronchoscopy scheduling to Dr. Valeta Harms --Will schedule PFTs pending bronch  Tobacco abuse Patient is an active smoker. We discussed smoking cessation for 8  minutes. We discussed triggers and stressors and ways to deal with them. We discussed barriers to continued smoking and benefits of smoking cessation. Provided patient with information cessation techniques and interventions.  Health Maintenance Immunization History  Administered Date(s) Administered  . Fluad Quad(high Dose 65+) 10/30/2018  . Influenza,inj,Quad PF,6+ Mos 12/05/2016, 12/19/2017   No orders of the defined types were placed in this encounter.  Meds ordered this encounter  Medications  . albuterol (VENTOLIN HFA) 108 (90 Base) MCG/ACT inhaler    Sig: Inhale 1-2 puffs into the lungs every 4 (four) hours as needed for wheezing or shortness of breath.    Dispense:  6.7 g    Refill:  6   No follow-ups on file.   [place], MD [facility] 11/03/2018 2:04 PM  Office Number [phone]

## 2018-11-04 ENCOUNTER — Ambulatory Visit: Payer: Medicare Other | Admitting: Pulmonary Disease

## 2018-11-04 ENCOUNTER — Telehealth: Payer: Self-pay | Admitting: Pulmonary Disease

## 2018-11-04 ENCOUNTER — Other Ambulatory Visit (INDEPENDENT_AMBULATORY_CARE_PROVIDER_SITE_OTHER): Payer: Medicare Other

## 2018-11-04 ENCOUNTER — Other Ambulatory Visit: Payer: Self-pay

## 2018-11-04 DIAGNOSIS — J984 Other disorders of lung: Secondary | ICD-10-CM | POA: Diagnosis not present

## 2018-11-04 DIAGNOSIS — R918 Other nonspecific abnormal finding of lung field: Secondary | ICD-10-CM | POA: Diagnosis not present

## 2018-11-04 LAB — COMPREHENSIVE METABOLIC PANEL
ALT: 18 U/L (ref 0–53)
AST: 21 U/L (ref 0–37)
Albumin: 4.2 g/dL (ref 3.5–5.2)
Alkaline Phosphatase: 84 U/L (ref 39–117)
BUN: 13 mg/dL (ref 6–23)
CO2: 26 mEq/L (ref 19–32)
Calcium: 9.2 mg/dL (ref 8.4–10.5)
Chloride: 104 mEq/L (ref 96–112)
Creatinine, Ser: 0.79 mg/dL (ref 0.40–1.50)
GFR: 97.48 mL/min (ref >60.00)
Glucose, Bld: 109 mg/dL — ABNORMAL HIGH (ref 70–99)
Potassium: 3.9 mEq/L (ref 3.5–5.1)
Sodium: 138 mEq/L (ref 135–145)
Total Bilirubin: 0.3 mg/dL (ref 0.2–1.2)
Total Protein: 7 g/dL (ref 6.0–8.3)

## 2018-11-04 LAB — CBC WITH DIFFERENTIAL/PLATELET
Basophils Absolute: 0.1 10*3/uL (ref 0.0–0.1)
Basophils Relative: 0.7 % (ref 0.0–3.0)
Eosinophils Absolute: 0.1 10*3/uL (ref 0.0–0.7)
Eosinophils Relative: 1.4 % (ref 0.0–5.0)
HCT: 42.1 % (ref 39.0–52.0)
Hemoglobin: 14.3 g/dL (ref 13.0–17.0)
Lymphocytes Relative: 31.4 % (ref 12.0–46.0)
Lymphs Abs: 2.7 10*3/uL (ref 0.7–4.0)
MCHC: 33.9 g/dL (ref 30.0–36.0)
MCV: 93.7 fl (ref 78.0–100.0)
Monocytes Absolute: 0.7 10*3/uL (ref 0.1–1.0)
Monocytes Relative: 7.9 % (ref 3.0–12.0)
Neutro Abs: 5.1 10*3/uL (ref 1.4–7.7)
Neutrophils Relative %: 58.6 % (ref 43.0–77.0)
Platelets: 177 10*3/uL (ref 150.0–400.0)
RBC: 4.49 Mil/uL (ref 4.22–5.81)
RDW: 15.4 % (ref 11.5–15.5)
WBC: 8.7 10*3/uL (ref 4.0–10.5)

## 2018-11-04 LAB — PROTIME-INR
INR: 1.1 ratio — ABNORMAL HIGH (ref 0.8–1.0)
Prothrombin Time: 13.3 s — ABNORMAL HIGH (ref 9.6–13.1)

## 2018-11-04 LAB — APTT: aPTT: 31.5 s (ref 23.4–32.7)

## 2018-11-04 NOTE — Addendum Note (Signed)
Addended by: Amado Coe on: 11/04/2018 10:10 AM   Modules accepted: Orders

## 2018-11-04 NOTE — Telephone Encounter (Signed)
Labs have been done.   Will need pre-procedural covid testing. Bronch is 10/30.Thanks.

## 2018-11-04 NOTE — Telephone Encounter (Signed)
Mario Mcdowell has scheduled this for  11/11/18

## 2018-11-04 NOTE — Telephone Encounter (Signed)
Spoke with Lauren as I did not see what orders needed to be placed. She reports this is being handled.  I see that [place] is also placing orders as patient is seeing Dr. Valeta Harms.

## 2018-11-04 NOTE — Telephone Encounter (Signed)
Mario Mcdowell has spoke with patientlet him know the covid scheduling. Will close message

## 2018-11-06 ENCOUNTER — Ambulatory Visit (INDEPENDENT_AMBULATORY_CARE_PROVIDER_SITE_OTHER)
Admission: RE | Admit: 2018-11-06 | Discharge: 2018-11-06 | Disposition: A | Discharge status: Home / Self Care | Payer: Medicare Other | Source: Ambulatory Visit | Attending: Pulmonary Disease | Admitting: Pulmonary Disease

## 2018-11-06 ENCOUNTER — Other Ambulatory Visit: Payer: Self-pay

## 2018-11-06 DIAGNOSIS — J984 Other disorders of lung: Secondary | ICD-10-CM

## 2018-11-06 DIAGNOSIS — J439 Emphysema, unspecified: Secondary | ICD-10-CM | POA: Diagnosis not present

## 2018-11-06 DIAGNOSIS — R911 Solitary pulmonary nodule: Secondary | ICD-10-CM | POA: Diagnosis not present

## 2018-11-07 LAB — QUANTIFERON-TB GOLD PLUS
Mitogen-NIL: >10 IU/mL
NIL: 0.1 IU/mL
QuantiFERON-TB Gold Plus: NEGATIVE
TB1-NIL: 0.03 IU/mL
TB2-NIL: 0.08 IU/mL

## 2018-11-11 ENCOUNTER — Other Ambulatory Visit (HOSPITAL_COMMUNITY)
Admission: RE | Admit: 2018-11-11 | Discharge: 2018-11-11 | Disposition: A | Discharge status: Home / Self Care | Payer: Medicare Other | Source: Ambulatory Visit | Attending: Pulmonary Disease | Admitting: Pulmonary Disease

## 2018-11-11 DIAGNOSIS — Z20828 Contact with and (suspected) exposure to other viral communicable diseases: Secondary | ICD-10-CM | POA: Diagnosis not present

## 2018-11-11 DIAGNOSIS — Z01812 Encounter for preprocedural laboratory examination: Secondary | ICD-10-CM | POA: Insufficient documentation

## 2018-11-12 LAB — NOVEL CORONAVIRUS, NAA (HOSP ORDER, SEND-OUT TO REF LAB; TAT 18-24 HRS): SARS-CoV-2, NAA: NOT DETECTED

## 2018-11-13 ENCOUNTER — Encounter (HOSPITAL_COMMUNITY): Payer: Self-pay | Admitting: *Deleted

## 2018-11-13 ENCOUNTER — Telehealth: Payer: Self-pay

## 2018-11-13 ENCOUNTER — Other Ambulatory Visit: Payer: Self-pay

## 2018-11-13 NOTE — Progress Notes (Signed)
Pt denies SOB, chest pain, and being under the care of a cardiologist. Pt stated that PCP is Dr. Ria Bush. Pt denies having a stress test, echo and cardiac cath. Pt denies having a chest x ray in the last year. Pt stated that last dose of Aspirin was 11/13/18 as instructed. Pt made aware to stop taking  vitamins, fish oil and herbal medications. Do not take any NSAIDs ie: Ibuprofen, Advil, Naproxen (Aleve), Motrin, BC and Goody Powder. Pt verbalized understanding of all pre-op instructions.

## 2018-11-13 NOTE — Telephone Encounter (Signed)
Called patient, confirmed date, time, and location for Bronch/Biopsy. Aware to arrive at [facility] at the central entrance. Aware to have someone with him. Nothing further needed at this time.

## 2018-11-14 ENCOUNTER — Encounter (HOSPITAL_COMMUNITY)
Admission: RE | Disposition: A | Discharge status: Home / Self Care | Payer: Self-pay | Source: Home / Self Care | Attending: Pulmonary Disease

## 2018-11-14 ENCOUNTER — Ambulatory Visit (HOSPITAL_COMMUNITY): Payer: Medicare Other | Admitting: Anesthesiology

## 2018-11-14 ENCOUNTER — Other Ambulatory Visit: Payer: Self-pay

## 2018-11-14 ENCOUNTER — Encounter (HOSPITAL_COMMUNITY): Payer: Self-pay | Admitting: *Deleted

## 2018-11-14 ENCOUNTER — Ambulatory Visit (HOSPITAL_COMMUNITY): Payer: Medicare Other

## 2018-11-14 ENCOUNTER — Ambulatory Visit (HOSPITAL_COMMUNITY)
Admission: RE | Admit: 2018-11-14 | Discharge: 2018-11-14 | Disposition: A | Discharge status: Home / Self Care | Payer: Medicare Other | Attending: Pulmonary Disease | Admitting: Pulmonary Disease

## 2018-11-14 DIAGNOSIS — Z85038 Personal history of other malignant neoplasm of large intestine: Secondary | ICD-10-CM | POA: Diagnosis not present

## 2018-11-14 DIAGNOSIS — J984 Other disorders of lung: Secondary | ICD-10-CM

## 2018-11-14 DIAGNOSIS — Z9889 Other specified postprocedural states: Secondary | ICD-10-CM

## 2018-11-14 DIAGNOSIS — Z7982 Long term (current) use of aspirin: Secondary | ICD-10-CM | POA: Diagnosis not present

## 2018-11-14 DIAGNOSIS — R911 Solitary pulmonary nodule: Secondary | ICD-10-CM

## 2018-11-14 DIAGNOSIS — Z419 Encounter for procedure for purposes other than remedying health state, unspecified: Secondary | ICD-10-CM

## 2018-11-14 DIAGNOSIS — Z79899 Other long term (current) drug therapy: Secondary | ICD-10-CM | POA: Insufficient documentation

## 2018-11-14 DIAGNOSIS — I70219 Atherosclerosis of native arteries of extremities with intermittent claudication, unspecified extremity: Secondary | ICD-10-CM | POA: Insufficient documentation

## 2018-11-14 DIAGNOSIS — J8410 Pulmonary fibrosis, unspecified: Secondary | ICD-10-CM | POA: Insufficient documentation

## 2018-11-14 DIAGNOSIS — M1990 Unspecified osteoarthritis, unspecified site: Secondary | ICD-10-CM | POA: Diagnosis not present

## 2018-11-14 DIAGNOSIS — F17210 Nicotine dependence, cigarettes, uncomplicated: Secondary | ICD-10-CM | POA: Insufficient documentation

## 2018-11-14 DIAGNOSIS — R05 Cough: Secondary | ICD-10-CM | POA: Diagnosis not present

## 2018-11-14 DIAGNOSIS — Z888 Allergy status to other drugs, medicaments and biological substances status: Secondary | ICD-10-CM | POA: Diagnosis not present

## 2018-11-14 DIAGNOSIS — R918 Other nonspecific abnormal finding of lung field: Secondary | ICD-10-CM | POA: Diagnosis not present

## 2018-11-14 DIAGNOSIS — Z880 Allergy status to penicillin: Secondary | ICD-10-CM | POA: Diagnosis not present

## 2018-11-14 DIAGNOSIS — E785 Hyperlipidemia, unspecified: Secondary | ICD-10-CM | POA: Diagnosis not present

## 2018-11-14 DIAGNOSIS — J181 Lobar pneumonia, unspecified organism: Secondary | ICD-10-CM | POA: Diagnosis not present

## 2018-11-14 HISTORY — PX: VIDEO BRONCHOSCOPY WITH ENDOBRONCHIAL NAVIGATION: SHX6175

## 2018-11-14 LAB — CBC
HCT: 45.2 % (ref 39.0–52.0)
Hemoglobin: 15.3 g/dL (ref 13.0–17.0)
MCH: 31.7 pg (ref 26.0–34.0)
MCHC: 33.8 g/dL (ref 30.0–36.0)
MCV: 93.8 fL (ref 80.0–100.0)
Platelets: 202 10*3/uL (ref 150–400)
RBC: 4.82 MIL/uL (ref 4.22–5.81)
RDW: 16.4 % — ABNORMAL HIGH (ref 11.5–15.5)
WBC: 9 10*3/uL (ref 4.0–10.5)
nRBC: 0 % (ref 0.0–0.2)

## 2018-11-14 LAB — COMPREHENSIVE METABOLIC PANEL
ALT: 21 U/L (ref 0–44)
AST: 21 U/L (ref 15–41)
Albumin: 3.7 g/dL (ref 3.5–5.0)
Alkaline Phosphatase: 74 U/L (ref 38–126)
Anion gap: 13 (ref 5–15)
BUN: 14 mg/dL (ref 8–23)
CO2: 25 mmol/L (ref 22–32)
Calcium: 9.3 mg/dL (ref 8.9–10.3)
Chloride: 103 mmol/L (ref 98–111)
Creatinine, Ser: 0.87 mg/dL (ref 0.61–1.24)
GFR calc Af Amer: >60 mL/min (ref >60)
GFR calc non Af Amer: >60 mL/min (ref >60)
Glucose, Bld: 96 mg/dL (ref 70–99)
Potassium: 4.1 mmol/L (ref 3.5–5.1)
Sodium: 141 mmol/L (ref 135–145)
Total Bilirubin: 0.6 mg/dL (ref 0.3–1.2)
Total Protein: 6.7 g/dL (ref 6.5–8.1)

## 2018-11-14 LAB — PROTIME-INR
INR: 1.1 (ref 0.8–1.2)
Prothrombin Time: 13.6 seconds (ref 11.4–15.2)

## 2018-11-14 LAB — APTT: aPTT: 36 seconds (ref 24–36)

## 2018-11-14 SURGERY — VIDEO BRONCHOSCOPY WITH ENDOBRONCHIAL NAVIGATION
Anesthesia: General

## 2018-11-14 MED ORDER — OXYCODONE HCL 5 MG PO TABS: 5.0000 mg | ORAL_TABLET | Freq: Once | ORAL | Status: DC | PRN

## 2018-11-14 MED ORDER — PROPOFOL 10 MG/ML IV BOLUS
INTRAVENOUS | Status: DC | PRN
  Administered 2018-11-14: 130 mg via INTRAVENOUS

## 2018-11-14 MED ORDER — FENTANYL CITRATE (PF) 250 MCG/5ML IJ SOLN
INTRAMUSCULAR | Status: AC
  Filled 2018-11-14: qty 5

## 2018-11-14 MED ORDER — LACTATED RINGERS IV SOLN
INTRAVENOUS | Status: DC | PRN
  Administered 2018-11-14: 07:00:00 via INTRAVENOUS

## 2018-11-14 MED ORDER — FENTANYL CITRATE (PF) 100 MCG/2ML IJ SOLN: 25.0000 ug | INTRAMUSCULAR | Status: DC | PRN

## 2018-11-14 MED ORDER — ROCURONIUM BROMIDE 10 MG/ML (PF) SYRINGE
PREFILLED_SYRINGE | INTRAVENOUS | Status: DC | PRN
  Administered 2018-11-14: 50 mg via INTRAVENOUS

## 2018-11-14 MED ORDER — PROPOFOL 10 MG/ML IV BOLUS
INTRAVENOUS | Status: AC
  Filled 2018-11-14: qty 20

## 2018-11-14 MED ORDER — OXYCODONE HCL 5 MG/5ML PO SOLN: 5.0000 mg | Freq: Once | ORAL | Status: DC | PRN

## 2018-11-14 MED ORDER — SUGAMMADEX SODIUM 200 MG/2ML IV SOLN
INTRAVENOUS | Status: DC | PRN
  Administered 2018-11-14: 200 mg via INTRAVENOUS

## 2018-11-14 MED ORDER — FENTANYL CITRATE (PF) 100 MCG/2ML IJ SOLN
INTRAMUSCULAR | Status: DC | PRN
  Administered 2018-11-14: 50 ug via INTRAVENOUS
  Administered 2018-11-14: 100 ug via INTRAVENOUS

## 2018-11-14 MED ORDER — PHENYLEPHRINE 40 MCG/ML (10ML) SYRINGE FOR IV PUSH (FOR BLOOD PRESSURE SUPPORT): PREFILLED_SYRINGE | INTRAVENOUS | Status: DC | PRN

## 2018-11-14 MED ORDER — MIDAZOLAM HCL 5 MG/5ML IJ SOLN
INTRAMUSCULAR | Status: DC | PRN
  Administered 2018-11-14: 2 mg via INTRAVENOUS

## 2018-11-14 MED ORDER — ONDANSETRON HCL 4 MG/2ML IJ SOLN: 4.0000 mg | Freq: Once | INTRAMUSCULAR | Status: DC | PRN

## 2018-11-14 MED ORDER — DEXAMETHASONE SODIUM PHOSPHATE 10 MG/ML IJ SOLN
INTRAMUSCULAR | Status: DC | PRN
  Administered 2018-11-14: 10 mg via INTRAVENOUS

## 2018-11-14 MED ORDER — MIDAZOLAM HCL 2 MG/2ML IJ SOLN
INTRAMUSCULAR | Status: AC
  Filled 2018-11-14: qty 2

## 2018-11-14 MED ORDER — ONDANSETRON HCL 4 MG/2ML IJ SOLN
INTRAMUSCULAR | Status: DC | PRN
  Administered 2018-11-14: 4 mg via INTRAVENOUS

## 2018-11-14 MED ORDER — PHENYLEPHRINE HCL-NACL 10-0.9 MG/250ML-% IV SOLN
INTRAVENOUS | Status: DC | PRN
  Administered 2018-11-14: 50 ug/min via INTRAVENOUS

## 2018-11-14 MED ORDER — LIDOCAINE 2% (20 MG/ML) 5 ML SYRINGE
INTRAMUSCULAR | Status: DC | PRN
  Administered 2018-11-14: 40 mg via INTRAVENOUS

## 2018-11-14 SURGICAL SUPPLY — 45 items
ADAPTER BRONCHOSCOPE OLYMPUS (ADAPTER) ×2 IMPLANT
ADAPTER VALVE BIOPSY EBUS (MISCELLANEOUS) IMPLANT
ADPTR VALVE BIOPSY EBUS (MISCELLANEOUS)
BRUSH CYTOL CELLEBRITY 1.5X140 (MISCELLANEOUS) ×2 IMPLANT
BRUSH SUPERTRAX BIOPSY (INSTRUMENTS) IMPLANT
BRUSH SUPERTRAX NDL-TIP CYTO (INSTRUMENTS) ×2 IMPLANT
CANISTER SUCT 3000ML PPV (MISCELLANEOUS) ×2 IMPLANT
CHANNEL WORK EXTEND EDGE 180 (KITS) IMPLANT
CHANNEL WORK EXTEND EDGE 90 (KITS) ×1 IMPLANT
CONT SPEC 4OZ CLIKSEAL STRL BL (MISCELLANEOUS) ×5 IMPLANT
COVER BACK TABLE 60X90IN (DRAPES) ×2 IMPLANT
COVER WAND RF STERILE (DRAPES) ×2 IMPLANT
FILTER STRAW FLUID ASPIR (MISCELLANEOUS) IMPLANT
FORCEPS BIOP SUPERTRX PREMAR (INSTRUMENTS) ×2 IMPLANT
GAUZE SPONGE 4X4 12PLY STRL (GAUZE/BANDAGES/DRESSINGS) ×2 IMPLANT
GLOVE INDICATOR 7.0 STRL GRN (GLOVE) ×1 IMPLANT
GLOVE SURG SS PI 6.5 STRL IVOR (GLOVE) ×1 IMPLANT
GLOVE SURG SS PI 7.5 STRL IVOR (GLOVE) ×2 IMPLANT
GOWN STRL REUS W/ TWL LRG LVL3 (GOWN DISPOSABLE) ×2 IMPLANT
GOWN STRL REUS W/TWL LRG LVL3 (GOWN DISPOSABLE) ×2
KIT CLEAN ENDO COMPLIANCE (KITS) ×3 IMPLANT
KIT LOCATABLE GUIDE (CANNULA) IMPLANT
KIT MARKER FIDUCIAL DELIVERY (KITS) IMPLANT
KIT PROCEDURE EDGE 180 (KITS) IMPLANT
KIT PROCEDURE EDGE 90 (KITS) IMPLANT
KIT TURNOVER KIT B (KITS) ×2 IMPLANT
MARKER SKIN DUAL TIP RULER LAB (MISCELLANEOUS) ×2 IMPLANT
NDL SUPERTRX PREMARK BIOPSY (NEEDLE) ×1 IMPLANT
NEEDLE SUPERTRX PREMARK BIOPSY (NEEDLE) ×2 IMPLANT
NS IRRIG 1000ML POUR BTL (IV SOLUTION) ×2 IMPLANT
OIL SILICONE PENTAX (PARTS (SERVICE/REPAIRS)) ×2 IMPLANT
PAD ARMBOARD 7.5X6 YLW CONV (MISCELLANEOUS) ×4 IMPLANT
PATCHES PATIENT (LABEL) ×6 IMPLANT
SYR 20ML ECCENTRIC (SYRINGE) ×2 IMPLANT
SYR 20ML LL LF (SYRINGE) ×2 IMPLANT
SYR 50ML SLIP (SYRINGE) ×3 IMPLANT
SYSTEM GENCUT CORE BIOPSY (NEEDLE) ×1 IMPLANT
TOWEL GREEN STERILE FF (TOWEL DISPOSABLE) ×2 IMPLANT
TRAP SPECIMEN MUCOUS 40CC (MISCELLANEOUS) ×1 IMPLANT
TUBE CONNECTING 20X1/4 (TUBING) ×2 IMPLANT
UNDERPAD 30X30 (UNDERPADS AND DIAPERS) ×2 IMPLANT
VALVE BIOPSY  SINGLE USE (MISCELLANEOUS) ×1
VALVE BIOPSY SINGLE USE (MISCELLANEOUS) ×1 IMPLANT
VALVE SUCTION BRONCHIO DISP (MISCELLANEOUS) ×2 IMPLANT
WATER STERILE IRR 1000ML POUR (IV SOLUTION) ×2 IMPLANT

## 2018-11-14 NOTE — Transfer of Care (Signed)
Immediate Anesthesia Transfer of Care Note  Patient: Mario Mcdowell  Procedure(s) Performed: VIDEO BRONCHOSCOPY WITH ENDOBRONCHIAL NAVIGATION (N/A ) VIDEO BRONCHOSCOPY WITH RADIAL ENDOBRONCHIAL ULTRASOUND (N/A )  Patient Location: PACU  Anesthesia Type:General  Level of Consciousness: awake, alert  and oriented  Airway & Oxygen Therapy: Patient Spontanous Breathing and Patient connected to face mask oxygen  Post-op Assessment: Report given to RN and Post -op Vital signs reviewed and stable  Post vital signs: Reviewed and stable  Last Vitals:  Vitals Value Taken Time  BP 159/72 11/14/18 0945  Temp    Pulse 98 11/14/18 0949  Resp 14 11/14/18 0949  SpO2 95 % 11/14/18 0949  Vitals shown include unvalidated device data.  Last Pain:  Vitals:   11/14/18 0610  TempSrc:   PainSc: 0-No pain      Patients Stated Pain Goal: 3 ([id number])  Complications: No apparent anesthesia complications

## 2018-11-14 NOTE — Discharge Instructions (Signed)
Flexible Bronchoscopy, Care After This sheet gives you information about how to care for yourself after your test. Your doctor may also give you more specific instructions. If you have problems or questions, contact your doctor. Follow these instructions at home: Eating and drinking:  The day after the test, go back to your normal diet. Driving  Do not drive for 24 hours if you were given a medicine to help you relax (sedative).  Do not drive or use heavy machinery while taking prescription pain medicine. General instructions   Take over-the-counter and prescription medicines only as told by your doctor.  Return to your normal activities as told. Ask what activities are safe for you.  Do not use any products that have nicotine or tobacco in them. This includes cigarettes and e-cigarettes. If you need help quitting, ask your doctor.  Keep all follow-up visits as told by your doctor. This is important. It is very important if you had a tissue sample (biopsy) taken. Get help right away if:  You have shortness of breath that gets worse.  You get light-headed.  You feel like you are going to pass out (faint).  You have chest pain.  You cough up: ? More than a little blood. ? More blood than before. Summary  Do not eat or drink anything (not even water) for 2 hours after your test, or until your numbing medicine wears off.  Do not use cigarettes. Do not use e-cigarettes.  Get help right away if you have chest pain. This information is not intended to replace advice given to you by your health care provider. Make sure you discuss any questions you have with your health care provider. Document Released: 10/29/2008 Document Revised: 12/14/2016 Document Reviewed: 01/20/2016 Elsevier Patient Education  2020 [employer].

## 2018-11-14 NOTE — Interval H&P Note (Signed)
History and Physical Interval Note:  11/14/2018 6:57 AM  Sherri Rad  has presented today for surgery, with the diagnosis of CAVITARY LESION OF LUNG.  The various methods of treatment have been discussed with the patient and family. After consideration of risks, benefits and other options for treatment, the patient has consented to  Procedure(s): VIDEO BRONCHOSCOPY WITH ENDOBRONCHIAL NAVIGATION (N/A) Isabella (N/A) as a surgical intervention.  The patient's history has been reviewed, patient examined, no change in status, stable for surgery.  I have reviewed the patient's chart and labs.  Questions were answered to the patient's satisfaction.    Patient seen in pre-op. No barriers to proceed. All questions answered. Discussed all risks benefits and alternatives. Patient is agreeable to proceed. Discussed risks of bleeding and pneumothorax.   [place]

## 2018-11-14 NOTE — Progress Notes (Signed)
Labs drawn DOS per MD order at 6:15.  Labs sent via tube station. 7AM: Called lab, blood tubes not received, facilities called, aware of tube station being down.  MD made aware of unresulted labs. OK to proceed.

## 2018-11-14 NOTE — Op Note (Signed)
Video Bronchoscopy with Electromagnetic Navigation Procedure Note  Date of Operation: 11/14/2018  Pre-op Diagnosis: 3 right upper lobe lesions, nodule, cavity and consolidation  Post-op Diagnosis: 3 right upper lobe lesions, nodule, cavity and consolidation  Surgeon: Garner Nash, DO   Assistants: None   Anesthesia: General endotracheal anesthesia  Operation: Flexible video fiberoptic bronchoscopy with electromagnetic navigation and biopsies.  Estimated Blood Loss: Minimal, <7TI   Complications: None   Indications and History: Mario Mcdowell is a 68 y.o. male with history of colon cancer, 3 right upper lobe lesions, longtime smoker, nodule, cavity and consolidation within 3 separate segments of the right upper lobe.  The risks, benefits, complications, treatment options and expected outcomes were discussed with the patient.  The possibilities of pneumothorax, pneumonia, reaction to medication, pulmonary aspiration, perforation of a viscus, bleeding, failure to diagnose a condition and creating a complication requiring transfusion or operation were discussed with the patient who freely signed the consent.    Description of Procedure: The patient was seen in the Preoperative Area, was examined and was deemed appropriate to proceed.  The patient was taken to [facility] OR 10, identified as Mario Mcdowell and the procedure verified as Flexible Video Fiberoptic Bronchoscopy.  A Time Out was held and the above information confirmed.   Prior to the date of the procedure a high-resolution CT scan of the chest was performed. Utilizing Tolono a virtual tracheobronchial tree was generated to allow the creation of distinct navigation pathways to the patient's parenchymal abnormalities. After being taken to the operating room general anesthesia was initiated and the patient  was orally intubated. The video fiberoptic bronchoscope was introduced via the endotracheal tube and a general  inspection was performed which showed normal left lung anatomy, anatomic variant with additional medial basilar subsegment on the left side, normal right lung anatomy no evidence of endobronchial disease. The extendable working channel and locator guide were introduced into the bronchoscope. The distinct navigation pathways prepared for 3 separate targets labeled target 1, target 2, target 3 prior to this procedure were then utilized to navigate to within less than 1 cm.  Each location was sampled and a full fluoroscopic suite with local registration was completed at each location.  Once the samples were taken from each target location the same procedure was completed at target 2 followed by target 3 of patient's lesion(s) identified on CT scan. The extendable working channel was secured into place and the locator guide was withdrawn. Under fluoroscopic guidance transbronchial needle brushings, transbronchial needle biopsies, and transbronchial forceps biopsies were performed to be sent for cytology and pathology.  We took also transbronchial forcep biopsies from each target lesion to be sent for tissue culture to include fungus and AFB.  2 bronchioalveolar lavages was performed in the right upper lobe and sent for cytology and microbiology (bacterial, fungal, AFB smears and cultures). At the end of the procedure a general airway inspection was performed and there was no evidence of active bleeding. The bronchoscope was removed.  The patient tolerated the procedure well. There was no significant blood loss and there were no obvious complications. A post-procedural chest x-ray is pending.  Samples: 1. Transbronchial needle brushings from target 1, target 2, target 3 2. Transbronchial Wang needle biopsies and Gencut device from target 2 3. Transbronchial forceps biopsies from target 1, target 2, target 3 sent for surgical pathology and tissue culture 4. Bronchoalveolar lavage from right upper lobe  Plans:   The patient will be discharged  from the PACU to home when recovered from anesthesia and after chest x-ray is reviewed. We will review the cytology, pathology and microbiology results with the patient when they become available. Outpatient followup will be with Dr. Rodman Pickle and Dr. Burr Medico.    Garner Nash, DO [facility] 11/14/2018 9:57 AM

## 2018-11-14 NOTE — Anesthesia Postprocedure Evaluation (Signed)
Anesthesia Post Note  Patient: Mario Mcdowell  Procedure(s) Performed: VIDEO BRONCHOSCOPY WITH ENDOBRONCHIAL NAVIGATION (N/A )     Patient location during evaluation: PACU Anesthesia Type: General Level of consciousness: awake and alert Pain management: pain level controlled Vital Signs Assessment: post-procedure vital signs reviewed and stable Respiratory status: spontaneous breathing, nonlabored ventilation, respiratory function stable and patient connected to nasal cannula oxygen Cardiovascular status: blood pressure returned to baseline and stable Postop Assessment: no apparent nausea or vomiting Anesthetic complications: no    Last Vitals:  Vitals:   11/14/18 1016 11/14/18 1022  BP:  (!) 142/69  Pulse: 75 65  Resp: 12 12  Temp:  (!) 36.1 C  SpO2: 96% 97%    Last Pain:  Vitals:   11/14/18 1022  TempSrc:   PainSc: 0-No pain                 Kendell Gammon COKER

## 2018-11-14 NOTE — Anesthesia Procedure Notes (Addendum)
Procedure Name: Intubation Date/Time: 11/14/2018 7:41 AM Performed by: Marsa Aris, CRNA Pre-anesthesia Checklist: Patient identified, Emergency Drugs available, Suction available and Patient being monitored Patient Re-evaluated:Patient Re-evaluated prior to induction Oxygen Delivery Method: Circle System Utilized Preoxygenation: Pre-oxygenation with 100% oxygen Induction Type: IV induction Ventilation: Mask ventilation without difficulty Laryngoscope Size: Miller and 2 Grade View: Grade I Tube type: Oral Tube size: 9.0 mm Number of attempts: 1 Airway Equipment and Method: Stylet and LTA kit utilized Placement Confirmation: ETT inserted through vocal cords under direct vision,  positive ETCO2 and breath sounds checked- equal and bilateral Secured at: 21 cm Tube secured with: Tape Dental Injury: Teeth and Oropharynx as per pre-operative assessment  Comments: No change in dentition from pre-procedure

## 2018-11-14 NOTE — Anesthesia Preprocedure Evaluation (Signed)
Anesthesia Evaluation  Patient identified by MRN, date of birth, ID band Patient awake    Reviewed: Allergy & Precautions, NPO status , Patient's Chart, lab work & pertinent test results  Airway Mallampati: II  TM Distance: >3 FB Neck ROM: Full    Dental  (+) Teeth Intact, Dental Advisory Given   Pulmonary Current Smoker,    breath sounds clear to auscultation       Cardiovascular  Rhythm:Regular Rate:Normal     Neuro/Psych    GI/Hepatic   Endo/Other    Renal/GU      Musculoskeletal   Abdominal   Peds  Hematology   Anesthesia Other Findings   Reproductive/Obstetrics                             Anesthesia Physical Anesthesia Plan  ASA: III  Anesthesia Plan: General   Post-op Pain Management:    Induction: Intravenous  PONV Risk Score and Plan: Ondansetron and Dexamethasone  Airway Management Planned: Oral ETT  Additional Equipment:   Intra-op Plan:   Post-operative Plan: Extubation in OR  Informed Consent: I have reviewed the patients History and Physical, chart, labs and discussed the procedure including the risks, benefits and alternatives for the proposed anesthesia with the patient or authorized representative who has indicated his/her understanding and acceptance.     Dental advisory given  Plan Discussed with: Anesthesiologist and CRNA  Anesthesia Plan Comments:         Anesthesia Quick Evaluation

## 2018-11-15 ENCOUNTER — Encounter (HOSPITAL_COMMUNITY): Payer: Self-pay | Admitting: Pulmonary Disease

## 2018-11-15 LAB — ACID FAST SMEAR (AFB, MYCOBACTERIA)
Acid Fast Smear: NEGATIVE
Acid Fast Smear: NEGATIVE
Acid Fast Smear: NEGATIVE
Acid Fast Smear: NEGATIVE

## 2018-11-16 LAB — CULTURE, RESPIRATORY W GRAM STAIN
Culture: NO GROWTH
Culture: NO GROWTH

## 2018-11-17 ENCOUNTER — Encounter: Payer: Self-pay | Admitting: Family Medicine

## 2018-11-17 LAB — FUNGUS STAIN

## 2018-11-17 LAB — CYTOLOGY - NON PAP

## 2018-11-17 LAB — FUNGAL STAIN REFLEX

## 2018-11-18 LAB — SURGICAL PATHOLOGY

## 2018-11-19 LAB — AEROBIC/ANAEROBIC CULTURE W GRAM STAIN (SURGICAL/DEEP WOUND)
Culture: NO GROWTH
Culture: NO GROWTH
Culture: NO GROWTH

## 2018-11-25 ENCOUNTER — Telehealth: Payer: Self-pay

## 2018-11-25 NOTE — Telephone Encounter (Signed)
-----   Message from [place], MD sent at 11/25/2018  1:49 PM EST ----- Regarding: Schedule for next available for 30 minutes Please contact and schedule patient for next available to discuss bronchoscopy results. - JE

## 2018-11-25 NOTE — Telephone Encounter (Signed)
LMTCB

## 2018-11-26 NOTE — Telephone Encounter (Signed)
Spoke with the pt and scheduled appt with Loanne Drilling at 3:30 tomorrow  Covid screen neg

## 2018-11-27 ENCOUNTER — Ambulatory Visit (INDEPENDENT_AMBULATORY_CARE_PROVIDER_SITE_OTHER): Payer: Medicare Other | Admitting: Pulmonary Disease

## 2018-11-27 ENCOUNTER — Encounter: Payer: Self-pay | Admitting: Pulmonary Disease

## 2018-11-27 ENCOUNTER — Other Ambulatory Visit: Payer: Self-pay

## 2018-11-27 VITALS — BP 144/70 | HR 61 | Temp 98.1°F | Ht 64.0 in | Wt 118.6 lb

## 2018-11-27 DIAGNOSIS — J8410 Pulmonary fibrosis, unspecified: Secondary | ICD-10-CM

## 2018-11-27 DIAGNOSIS — J984 Other disorders of lung: Secondary | ICD-10-CM

## 2018-11-27 NOTE — Patient Instructions (Signed)
Granulomatous Lung This may be related to metal exposure. We will check a heavy metal panel We will also repeat CT Chest without contrast in 3 months  Follow-up with me

## 2018-11-28 ENCOUNTER — Telehealth: Payer: Self-pay | Admitting: Pulmonary Disease

## 2018-11-28 MED ORDER — CIPROFLOXACIN HCL 500 MG PO TABS: 500.0000 mg | ORAL_TABLET | Freq: Two times a day (BID) | ORAL | 0 refills | Status: DC

## 2018-11-28 NOTE — Telephone Encounter (Signed)
Pt calling back regarding mix up with medication.  Dr. Loanne Drilling prescribed antibiotic and an inhaler called in to the pharmacy.  Please advise.  [phone]

## 2018-11-28 NOTE — Telephone Encounter (Signed)
ATC pt, no answer. Left message for pt to call back.  

## 2018-11-28 NOTE — Telephone Encounter (Signed)
Spoke with the pt  He states that Dr Loanne Drilling was supposed to have sent him an abx to pharm but it was not there when he went to pick up  Spoke with Dr Loanne Drilling  She states to call in Cipro 500 mg bid x 5 days  Rx was sent  Nothing further needed per pt

## 2018-12-03 LAB — TIQ-MISC

## 2018-12-03 LAB — HEAVY METALS PANEL, BLOOD
Arsenic: <10 mcg/L (ref <23)
Lead: 1 ug/dL (ref <5)
Mercury, B: <5 mcg/L (ref 0–10)

## 2018-12-03 LAB — EXTRA SPECIMEN

## 2018-12-03 LAB — COBALT, SERUM/PLASMA

## 2018-12-03 NOTE — Progress Notes (Signed)
Subjective:   PATIENT ID: Mario Mcdowell GENDER: male DOB: 1951-01-08, MRN: [id number]   HPI  Chief Complaint  Patient presents with  . Follow-up    Discuss bronch results.     Reason for Visit: Follow-up bronchoscopy  Mario Mcdowell is a 68 year old male current smoker with stage IIIB colon cancer s/p hemicolectomy s/p FOLFOX in remission who presents for follow-up.  He underwent navigational bronchoscopy on 11/14/18. Results with no evidence of cancer. Focal non-caseating granuloma in the right upper lobe.  Today, he denies cough, shortness of breath or wheezing. He is able to perform regular activities with no limitation. Has not ever been on inhalers and does not feel like he needs any at this time. Is working on quitting to smoke.  Social History: Active smoker  Environmental exposures:  [place] Denies recent travel, exposure to individuals with homelessness or jail/prison  I have personally reviewed patient's past medical/family/social history/allergies/current medications.  Past Medical History:  Diagnosis Date  . Aorto-iliac atherosclerosis ([place])    by CT; in bilateral legs   . Arthritis   . Atherosclerotic PVD with intermittent claudication ([place])   . BPH without obstruction/lower urinary tract symptoms 10/25/2015  . Centrilobular emphysema ([place])    per chest CT 04-23-2016  . Colon adenocarcinoma [facility]) oncologist- dr Burr Medico   dx 05-21-2016 -- Stage IIIB (pT3,pN1,M0) , Invasive sigmoid adenocarcinoma well to moderately differentiated with metastatic intra-abdominal lymph node (1 out of 22), s/p lap. partial colectomy Mario Mcdowell) 05/21/2016 and chemotherapy is planned  . Full dentures   . History of kidney stones 07/2015   passed without intervention  . Pulmonary nodules    per chest CT 04-23-2016 bilateral nodules (consistent w/ granulomatous disease)  . Smokers' cough ([place])   . Wears contact lenses      Family History  Problem Relation Age of Onset  . Cancer  Paternal Aunt        unsure details  . Diabetes Cousin   . Cancer Paternal Uncle        unknow type cancer   . Prostate cancer Neg Hx   . Kidney cancer Neg Hx   . CAD Neg Hx   . Stroke Neg Hx   . Colon cancer Neg Hx      Social History   Occupational History  . Not on file  Tobacco Use  . Smoking status: Current Every Day Smoker    Packs/day: 1.00    Years: 46.00    Pack years: 46.00    Types: Cigarettes  . Smokeless tobacco: Never Used  . Tobacco comment: 3/4 pack daily  Substance and Sexual Activity  . Alcohol use: No    Comment: quit 2000  . Drug use: No  . Sexual activity: Not Currently    Birth control/protection: Abstinence    Allergies  Allergen Reactions  . Penicillins Swelling    Did it involve swelling of the face/tongue/throat, SOB, or low BP? Yes Did it involve sudden or severe rash/hives, skin peeling, or any reaction on the inside of your mouth or nose? No Did you need to seek medical attention at a hospital or doctor's office? No When did it last happen?childhood allergy If all above answers are "NO", may proceed with cephalosporin use.    . Lipitor [Atorvastatin] Nausea Only     Outpatient Medications Prior to Visit  Medication Sig Dispense Refill  . albuterol (VENTOLIN HFA) 108 (90 Base) MCG/ACT inhaler Inhale 1-2 puffs into the lungs every  4 (four) hours as needed for wheezing or shortness of breath. 6.7 g 6  . aspirin EC 81 MG tablet Take 81 mg by mouth daily.    Marland Kitchen ibuprofen (ADVIL) 200 MG tablet Take 400 mg by mouth every 6 (six) hours as needed for moderate pain.    . rosuvastatin (CRESTOR) 5 MG tablet Take 1 tablet (5 mg total) by mouth daily. 30 tablet   . tiZANidine (ZANAFLEX) 4 MG tablet TAKE 1 TABLET BY MOUTH TWICE DAILY AS NEEDED FOR MUSCLE SPASMS (SEDATION PRECAUTIONS) (Patient taking differently: Take 4 mg by mouth 2 (two) times daily as needed for muscle spasms. ) 30 tablet 0   Facility-Administered Medications Prior to Visit   Medication Dose Route Frequency Provider Last Rate Last Dose  . 0.9 %  sodium chloride infusion  500 mL Intravenous Once Ladene Artist, MD        Review of Systems  Constitutional: Negative for chills, diaphoresis, fever, malaise/fatigue and weight loss.  HENT: Negative for congestion.   Respiratory: Negative for cough, hemoptysis, sputum production, shortness of breath and wheezing.   Cardiovascular: Negative for chest pain, palpitations and leg swelling.    Objective:   Vitals:   11/27/18 1533  BP: (!) 144/70  Pulse: 61  Temp: 98.1 F (36.7 C)  TempSrc: Temporal  SpO2: 97%  Weight: 118 lb 9.6 oz (53.8 kg)  Height: 5\' 4"  (1.626 m)   SpO2: 97 %(on RA) O2 Device: None (Room air)  Physical Exam: General: Well-appearing, no acute distress HENT: [place], AT Eyes: EOMI, no scleral icterus Respiratory: Clear to auscultation bilaterally.  No crackles, wheezing or rales Cardiovascular: RRR, -M/R/G, no JVD GI: BS+, soft, nontender Extremities:-Edema,-tenderness Neuro: AAO x4, CNII-XII grossly intact Skin: Intact, no rashes or bruising Psych: Normal mood, normal affect  Data Reviewed:  Imaging: CT Chest 10/17/18 - 5x4cm and 3x2cm lung masses, slightly improved compared to prior imaging. Background emphysema.  PFT: None on file  Labs: CBC    Component Value Date/Time   WBC 9.0 11/14/2018 0552   RBC 4.82 11/14/2018 0552   HGB 15.3 11/14/2018 0552   HGB 14.9 12/03/2016 0748   HCT 45.2 11/14/2018 0552   HCT 44.3 12/03/2016 0748   PLT 202 11/14/2018 0552   PLT 178 12/03/2016 0748   MCV 93.8 11/14/2018 0552   MCV 97.5 12/03/2016 0748   MCH 31.7 11/14/2018 0552   MCHC 33.8 11/14/2018 0552   RDW 16.4 (H) 11/14/2018 0552   RDW 12.7 12/03/2016 0748   LYMPHSABS 2.7 11/04/2018 1011   LYMPHSABS 3.5 (H) 12/03/2016 0748   MONOABS 0.7 11/04/2018 1011   MONOABS 0.5 12/03/2016 0748   EOSABS 0.1 11/04/2018 1011   EOSABS 0.1 12/03/2016 0748   BASOSABS 0.1 11/04/2018 1011    BASOSABS 0.0 12/03/2016 0748   BMET    Component Value Date/Time   NA 141 11/14/2018 0552   NA 139 12/03/2016 0748   K 4.1 11/14/2018 0552   K 4.4 12/03/2016 0748   CL 103 11/14/2018 0552   CO2 25 11/14/2018 0552   CO2 26 12/03/2016 0748   GLUCOSE 96 11/14/2018 0552   GLUCOSE 108 12/03/2016 0748   BUN 14 11/14/2018 0552   BUN 14.4 12/03/2016 0748   CREATININE 0.87 11/14/2018 0552   CREATININE 0.81 06/12/2018 1320   CREATININE 0.8 12/03/2016 0748   CALCIUM 9.3 11/14/2018 0552   CALCIUM 9.5 12/03/2016 0748   GFRNONAA >60 11/14/2018 0552   GFRNONAA >60 06/12/2018 1320   GFRAA >60  11/14/2018 0552   GFRAA >60 06/12/2018 1320   Imaging, labs and tests noted above have been reviewed independently by me.    Assessment & Plan:   Discussion: 68 year old male active smoker with hx of adenocarcinoma of the sigmoid colon s/p hemicolectomy and FOLFOX currently in remission. Initially referred to me for interval development of 5x4cm and 3x2 lung masses in the RUL since 08/2018 on surveillance imaging. Review of 08/29/17 and 04/23/16 CT Chest demonstrate apical scarring in the RUL area however recent findings have more extensive involvement concerning for malignancy and/or atypical infection.   Underwent bronchoscopy on 11/14/18. Results with no evidence of cancer. However did show focal non-caseating granuloma in the right upper lobe. In setting of calcified pulmonary nodules scattered on CT Chest this could represent sarcoid.   Granulomatous Lung Emphysema Potentially may be an atypical presentation of sarcoid. Consider possibly related to metal exposure.  --We will check a heavy metal panel --We will also repeat CT Chest without contrast in 3 months  Tobacco abuse Patient is an active smoker. We discussed smoking cessation for 8  minutes. We discussed triggers and stressors and ways to deal with them. We discussed barriers to continued smoking and benefits of smoking cessation. Provided  patient with information cessation techniques and interventions.  Health Maintenance Immunization History  Administered Date(s) Administered  . Fluad Quad(high Dose 65+) 10/30/2018  . Influenza,inj,Quad PF,6+ Mos 12/05/2016, 12/19/2017   Orders Placed This Encounter  Procedures  . TIQ-MISC  . CT Chest Wo Contrast    Needs to be done in 3 months please    Standing Status:   Future    Standing Expiration Date:   01/27/2020    Order Specific Question:   Preferred imaging location?    Answer:   [id number]    Order Specific Question:   Radiology Contrast Protocol - do NOT remove file path    Answer:   [email or web address]  . Heavy Metals Panel, Blood    Standing Status:   Future    Number of Occurrences:   1    Standing Expiration Date:   11/27/2019  . Cobalt, Serum/Plasma    Standing Status:   Future    Number of Occurrences:   1    Standing Expiration Date:   11/27/2019  . Extra Specimen   No orders of the defined types were placed in this encounter.  Return in about 3 months (around 02/27/2019) for after CT.  [place], MD [facility] 12/03/2018 9:45 PM  Office Number [phone]

## 2018-12-06 LAB — CULTURE, FUNGUS WITHOUT SMEAR

## 2018-12-07 ENCOUNTER — Encounter: Payer: Self-pay | Admitting: Pulmonary Disease

## 2018-12-16 LAB — FUNGUS CULTURE RESULT

## 2018-12-16 LAB — FUNGUS CULTURE WITH STAIN

## 2018-12-16 LAB — FUNGAL ORGANISM REFLEX

## 2018-12-27 LAB — ACID FAST CULTURE WITH REFLEXED SENSITIVITIES (MYCOBACTERIA)
Acid Fast Culture: NEGATIVE
Acid Fast Culture: NEGATIVE
Acid Fast Culture: NEGATIVE
Acid Fast Culture: NEGATIVE

## 2018-12-30 ENCOUNTER — Other Ambulatory Visit: Payer: Self-pay

## 2018-12-30 ENCOUNTER — Ambulatory Visit: Payer: Medicare Other | Admitting: Podiatry

## 2018-12-30 ENCOUNTER — Encounter: Payer: Self-pay | Admitting: Podiatry

## 2018-12-30 DIAGNOSIS — L84 Corns and callosities: Secondary | ICD-10-CM | POA: Diagnosis not present

## 2018-12-30 DIAGNOSIS — Q828 Other specified congenital malformations of skin: Secondary | ICD-10-CM

## 2018-12-30 NOTE — Progress Notes (Signed)
This patient presents the office for follow-up treatment for painful calluses on both feet.  Patient states he has tried to trim the callus himself at home but the calluses have become thick and painful.  He presents the office today for preventative foot care services  Vascular  Dorsalis pedis and posterior tibial pulses are palpable  B/L.  Capillary return  WNL.  Temperature gradient is  WNL.  Skin turgor  WNL  Sensorium  Senn Weinstein monofilament wire  WNL. Normal tactile sensation.  Nail Exam  Patient has normal nails with no evidence of bacterial or fungal infection.  Orthopedic  Exam  Muscle tone and muscle strength  WNL.  No limitations of motion feet  B/L.  No crepitus or joint effusion noted.  Foot type is unremarkable and digits show no abnormalities.  Bony prominences are unremarkable.  Skin  No open lesions.  Normal skin texture and turgor.  Callus sub 5th met  B/L and right heel.  Porokeratosis/heel callus right  ROV.  Debride porokeratosis/callus B/L.  RTC prn.  Patient to see Dr.  Paulla Dolly for surgical consultation.   [name] DPM

## 2019-01-14 ENCOUNTER — Other Ambulatory Visit: Payer: Self-pay | Admitting: Family Medicine

## 2019-01-14 DIAGNOSIS — E7800 Pure hypercholesterolemia, unspecified: Secondary | ICD-10-CM

## 2019-01-14 DIAGNOSIS — C187 Malignant neoplasm of sigmoid colon: Secondary | ICD-10-CM

## 2019-01-14 DIAGNOSIS — C772 Secondary and unspecified malignant neoplasm of intra-abdominal lymph nodes: Secondary | ICD-10-CM

## 2019-01-14 DIAGNOSIS — R7303 Prediabetes: Secondary | ICD-10-CM

## 2019-01-14 DIAGNOSIS — Z125 Encounter for screening for malignant neoplasm of prostate: Secondary | ICD-10-CM

## 2019-01-15 ENCOUNTER — Ambulatory Visit (INDEPENDENT_AMBULATORY_CARE_PROVIDER_SITE_OTHER): Payer: Medicare Other

## 2019-01-15 ENCOUNTER — Other Ambulatory Visit (INDEPENDENT_AMBULATORY_CARE_PROVIDER_SITE_OTHER): Payer: Medicare Other

## 2019-01-15 ENCOUNTER — Other Ambulatory Visit: Payer: Self-pay

## 2019-01-15 DIAGNOSIS — Z125 Encounter for screening for malignant neoplasm of prostate: Secondary | ICD-10-CM | POA: Diagnosis not present

## 2019-01-15 DIAGNOSIS — Z0000 Encounter for general adult medical examination without abnormal findings: Secondary | ICD-10-CM | POA: Diagnosis not present

## 2019-01-15 DIAGNOSIS — R7303 Prediabetes: Secondary | ICD-10-CM | POA: Diagnosis not present

## 2019-01-15 DIAGNOSIS — E7800 Pure hypercholesterolemia, unspecified: Secondary | ICD-10-CM

## 2019-01-15 NOTE — Patient Instructions (Signed)
Mario Mcdowell , Thank you for taking time to come for your Medicare Wellness Visit. I appreciate your ongoing commitment to your health goals. Please review the following plan we discussed and let me know if I can assist you in the future.   Screening recommendations/referrals: Colonoscopy: Up to date, completed 05/29/2017 Recommended yearly ophthalmology/optometry visit for glaucoma screening and checkup Recommended yearly dental visit for hygiene and checkup  Vaccinations: Influenza vaccine: Up to date, completed 10/30/2018 Pneumococcal vaccine: declined Tdap vaccine: decline Shingles vaccine: discussed    Advanced directives: Advance directive discussed with you today. Even though you declined this today please call our office should you change your mind and we can give you the proper paperwork for you to fill out.  Conditions/risks identified: hyperlipidemia  Next appointment: 01/19/2019 @ 3 pm   Preventive Care 65 Years and Older, Male Preventive care refers to lifestyle choices and visits with your health care provider that can promote health and wellness. What does preventive care include?  A yearly physical exam. This is also called an annual well check.  Dental exams once or twice a year.  Routine eye exams. Ask your health care provider how often you should have your eyes checked.  Personal lifestyle choices, including:  Daily care of your teeth and gums.  Regular physical activity.  Eating a healthy diet.  Avoiding tobacco and drug use.  Limiting alcohol use.  Practicing safe sex.  Taking low doses of aspirin every day.  Taking vitamin and mineral supplements as recommended by your health care provider. What happens during an annual well check? The services and screenings done by your health care provider during your annual well check will depend on your age, overall health, lifestyle risk factors, and family history of disease. Counseling  Your health care  provider may ask you questions about your:  Alcohol use.  Tobacco use.  Drug use.  Emotional well-being.  Home and relationship well-being.  Sexual activity.  Eating habits.  History of falls.  Memory and ability to understand (cognition).  Work and work Statistician. Screening  You may have the following tests or measurements:  Height, weight, and BMI.  Blood pressure.  Lipid and cholesterol levels. These may be checked every 5 years, or more frequently if you are over 108 years old.  Skin check.  Lung cancer screening. You may have this screening every year starting at age 30 if you have a 30-pack-year history of smoking and currently smoke or have quit within the past 15 years.  Fecal occult blood test (FOBT) of the stool. You may have this test every year starting at age 68.  Flexible sigmoidoscopy or colonoscopy. You may have a sigmoidoscopy every 5 years or a colonoscopy every 10 years starting at age 6.  Prostate cancer screening. Recommendations will vary depending on your family history and other risks.  Hepatitis C blood test.  Hepatitis B blood test.  Sexually transmitted disease (STD) testing.  Diabetes screening. This is done by checking your blood sugar (glucose) after you have not eaten for a while (fasting). You may have this done every 1-3 years.  Abdominal aortic aneurysm (AAA) screening. You may need this if you are a current or former smoker.  Osteoporosis. You may be screened starting at age 60 if you are at high risk. Talk with your health care provider about your test results, treatment options, and if necessary, the need for more tests. Vaccines  Your health care provider may recommend certain vaccines, such  as:  Influenza vaccine. This is recommended every year.  Tetanus, diphtheria, and acellular pertussis (Tdap, Td) vaccine. You may need a Td booster every 10 years.  Zoster vaccine. You may need this after age 35.  Pneumococcal  13-valent conjugate (PCV13) vaccine. One dose is recommended after age 48.  Pneumococcal polysaccharide (PPSV23) vaccine. One dose is recommended after age 79. Talk to your health care provider about which screenings and vaccines you need and how often you need them. This information is not intended to replace advice given to you by your health care provider. Make sure you discuss any questions you have with your health care provider. Document Released: 01/28/2015 Document Revised: 09/21/2015 Document Reviewed: 11/02/2014 Elsevier Interactive Patient Education  2017 [place] Prevention in the Home Falls can cause injuries. They can happen to people of all ages. There are many things you can do to make your home safe and to help prevent falls. What can I do on the outside of my home?  Regularly fix the edges of walkways and driveways and fix any cracks.  Remove anything that might make you trip as you walk through a door, such as a raised step or threshold.  Trim any bushes or trees on the path to your home.  Use bright outdoor lighting.  Clear any walking paths of anything that might make someone trip, such as rocks or tools.  Regularly check to see if handrails are loose or broken. Make sure that both sides of any steps have handrails.  Any raised decks and porches should have guardrails on the edges.  Have any leaves, snow, or ice cleared regularly.  Use sand or salt on walking paths during winter.  Clean up any spills in your garage right away. This includes oil or grease spills. What can I do in the bathroom?  Use night lights.  Install grab bars by the toilet and in the tub and shower. Do not use towel bars as grab bars.  Use non-skid mats or decals in the tub or shower.  If you need to sit down in the shower, use a plastic, non-slip stool.  Keep the floor dry. Clean up any water that spills on the floor as soon as it happens.  Remove soap buildup in the  tub or shower regularly.  Attach bath mats securely with double-sided non-slip rug tape.  Do not have throw rugs and other things on the floor that can make you trip. What can I do in the bedroom?  Use night lights.  Make sure that you have a light by your bed that is easy to reach.  Do not use any sheets or blankets that are too big for your bed. They should not hang down onto the floor.  Have a firm chair that has side arms. You can use this for support while you get dressed.  Do not have throw rugs and other things on the floor that can make you trip. What can I do in the kitchen?  Clean up any spills right away.  Avoid walking on wet floors.  Keep items that you use a lot in easy-to-reach places.  If you need to reach something above you, use a strong step stool that has a grab bar.  Keep electrical cords out of the way.  Do not use floor polish or wax that makes floors slippery. If you must use wax, use non-skid floor wax.  Do not have throw rugs and other things on the  floor that can make you trip. What can I do with my stairs?  Do not leave any items on the stairs.  Make sure that there are handrails on both sides of the stairs and use them. Fix handrails that are broken or loose. Make sure that handrails are as long as the stairways.  Check any carpeting to make sure that it is firmly attached to the stairs. Fix any carpet that is loose or worn.  Avoid having throw rugs at the top or bottom of the stairs. If you do have throw rugs, attach them to the floor with carpet tape.  Make sure that you have a light switch at the top of the stairs and the bottom of the stairs. If you do not have them, ask someone to add them for you. What else can I do to help prevent falls?  Wear shoes that:  Do not have high heels.  Have rubber bottoms.  Are comfortable and fit you well.  Are closed at the toe. Do not wear sandals.  If you use a stepladder:  Make sure that it  is fully opened. Do not climb a closed stepladder.  Make sure that both sides of the stepladder are locked into place.  Ask someone to hold it for you, if possible.  Clearly mark and make sure that you can see:  Any grab bars or handrails.  First and last steps.  Where the edge of each step is.  Use tools that help you move around (mobility aids) if they are needed. These include:  Canes.  Walkers.  Scooters.  Crutches.  Turn on the lights when you go into a dark area. Replace any light bulbs as soon as they burn out.  Set up your furniture so you have a clear path. Avoid moving your furniture around.  If any of your floors are uneven, fix them.  If there are any pets around you, be aware of where they are.  Review your medicines with your doctor. Some medicines can make you feel dizzy. This can increase your chance of falling. Ask your doctor what other things that you can do to help prevent falls. This information is not intended to replace advice given to you by your health care provider. Make sure you discuss any questions you have with your health care provider. Document Released: 10/28/2008 Document Revised: 06/09/2015 Document Reviewed: 02/05/2014 Elsevier Interactive Patient Education  2017 [employer].

## 2019-01-15 NOTE — Progress Notes (Signed)
Subjective:   Mario Mcdowell is a 68 y.o. male who presents for Medicare Annual/Subsequent preventive examination.  Review of Systems: N/A   This visit is being conducted through telemedicine via telephone at the nurse health advisor's home address due to the COVID-19 pandemic. This patient has given me verbal consent via doximity to conduct this visit, patient states they are participating from their home address. Patient and myself are on the telephone call. There is no referral for this visit. Some vital signs may be absent or patient reported.    Patient identification: identified by name, DOB, and current address   Cardiac Risk Factors include: advanced age (>24men, >75 women);dyslipidemia;male gender     Objective:    Vitals: There were no vitals taken for this visit.  There is no height or weight on file to calculate BMI.  Advanced Directives 01/15/2019 11/14/2018 01/10/2018 11/27/2017 09/02/2017 07/10/2017 05/29/2017  Does Patient Have a Medical Advance Directive? No No No No No No No  Would patient like information on creating a medical advance directive? No - Patient declined No - Patient declined No - Patient declined - - No - Patient declined -    Tobacco Social History   Tobacco Use  Smoking Status Current Every Day Smoker  . Packs/day: 1.00  . Years: 46.00  . Pack years: 46.00  . Types: Cigarettes  Smokeless Tobacco Never Used  Tobacco Comment   3/4 pack daily     Ready to quit: Not Answered Counseling given: Not Answered Comment: 3/4 pack daily   Clinical Intake:  Pre-visit preparation completed: Yes  Pain : No/denies pain     Nutritional Risks: None Diabetes: No  How often do you need to have someone help you when you read instructions, pamphlets, or other written materials from your doctor or pharmacy?: 1 - Never What is the last grade level you completed in school?: 7th  Interpreter Needed?: No  Information entered by :: CJohnson, LPN  Past  Medical History:  Diagnosis Date  . Aorto-iliac atherosclerosis ([place])    by CT; in bilateral legs   . Arthritis   . Atherosclerotic PVD with intermittent claudication ([place])   . BPH without obstruction/lower urinary tract symptoms 10/25/2015  . Centrilobular emphysema ([place])    per chest CT 04-23-2016  . 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 partial colectomy [name]) 05/21/2016 and chemotherapy is planned  . Full dentures   . History of kidney stones 07/2015   passed without intervention  . Pulmonary nodules    per chest CT 04-23-2016 bilateral nodules (consistent w/ granulomatous disease)  . Smokers' cough ([place])   . Wears contact lenses    Past Surgical History:  Procedure Laterality Date  . COLONOSCOPY  03/12/2016   4 polyps, sigmoid tumor biopsied (TVA w/ MICROSCOPIC FOCUS SUSPICIOUS FOR INVASION) pending surgical resection, rpt 1 yr [facility] Plan)  . COLONOSCOPY  05/2017   2 benign polyps, rpt 2 yrs [facility] Plan)  . LAPAROSCOPIC PARTIAL COLECTOMY N/A 05/21/2016   colon cancer, 1/20 positive lymp nodes. LAPAROSCOPIC PARTIAL COLECTOMY, SPLENIC FLEXURE MOBILIZATION AND PROCTOSCOPY;  [name], MD)  . MULTIPLE TOOTH EXTRACTIONS  1980's  . PORT-A-CATH REMOVAL Right 10/04/2016   Procedure: REMOVAL PORT-A-CATH;  Surgeon: [name], MD;  [facility]  Service: General;  Laterality: Right;  . PORTACATH PLACEMENT N/A 06/21/2016  Procedure: INSERTION PORT-A-CATH;  Surgeon: [name], MD;  Location: [facility];  Service: General;  Laterality: N/A;  right  . VIDEO BRONCHOSCOPY WITH ENDOBRONCHIAL NAVIGATION N/A 11/14/2018   VIDEO BRONCHOSCOPY WITH ENDOBRONCHIAL NAVIGATION for R lung cavitary lesions;  Icard, Octavio Graves, DO   Family History  Problem Relation Age of Onset    . Cancer Paternal Aunt        unsure details  . Diabetes Cousin   . Cancer Paternal Uncle        unknow type cancer   . Prostate cancer Neg Hx   . Kidney cancer Neg Hx   . CAD Neg Hx   . Stroke Neg Hx   . Colon cancer Neg Hx    Social History   Socioeconomic History  . Marital status: Married    Spouse name: Not on file  . Number of children: Not on file  . Years of education: Not on file  . Highest education level: Not on file  Occupational History  . Not on file  Tobacco Use  . Smoking status: Current Every Day Smoker    Packs/day: 1.00    Years: 46.00    Pack years: 46.00    Types: Cigarettes  . Smokeless tobacco: Never Used  . Tobacco comment: 3/4 pack daily  Substance and Sexual Activity  . Alcohol use: No    Comment: quit 2000  . Drug use: No  . Sexual activity: Not Currently    Birth control/protection: Abstinence  Other Topics Concern  . Not on file  Social History Narrative   Lives with wife, adopted daughter, adopted son   [id number]   Edu: 8th grade   Activity: no regular exercise   Diet: no water - causes nausea. Fruits/vegetables daily.   Social Determinants of Health   Financial Resource Strain: Low Risk   . Difficulty of Paying Living Expenses: Not hard at all  Food Insecurity: No Food Insecurity  . Worried About Charity fundraiser in the Last Year: Never true  . Ran Out of Food in the Last Year: Never true  Transportation Needs: No Transportation Needs  . Lack of Transportation (Medical): No  . Lack of Transportation (Non-Medical): No  Physical Activity: Inactive  . Days of Exercise per Week: 0 days  . Minutes of Exercise per Session: 0 min  Stress: No Stress Concern Present  . Feeling of Stress : Not at all  Social Connections:   . Frequency of Communication with Friends and Family: Not on file  . Frequency of Social Gatherings with Friends and Family: Not on file  . Attends Religious Services: Not on file  . Active  Member of Clubs or Organizations: Not on file  . Attends Archivist Meetings: Not on file  . Marital Status: Not on file    Outpatient Encounter Medications as of 01/15/2019  Medication Sig  . aspirin EC 81 MG tablet Take 81 mg by mouth daily.  Marland Kitchen ibuprofen (ADVIL) 200 MG tablet Take 400 mg by mouth every 6 (six) hours as needed for moderate pain.  . rosuvastatin (CRESTOR) 5 MG tablet Take 1 tablet (5 mg total) by mouth daily.  Marland Kitchen tiZANidine (ZANAFLEX) 4 MG tablet TAKE 1 TABLET BY MOUTH TWICE DAILY AS NEEDED FOR MUSCLE SPASMS (SEDATION PRECAUTIONS) (Patient taking differently: Take 4 mg by mouth 2 (two) times daily as needed for muscle spasms. )  . albuterol (VENTOLIN HFA) 108 (90 Base) MCG/ACT inhaler Inhale 1-2 puffs into  the lungs every 4 (four) hours as needed for wheezing or shortness of breath.  . ciprofloxacin (CIPRO) 500 MG tablet Take 1 tablet (500 mg total) by mouth 2 (two) times daily. (Patient not taking: Reported on 01/15/2019)   Facility-Administered Encounter Medications as of 01/15/2019  Medication  . 0.9 %  sodium chloride infusion    Activities of Daily Living In your present state of health, do you have any difficulty performing the following activities: 01/15/2019 11/14/2018  Hearing? N N  Vision? N N  Difficulty concentrating or making decisions? N N  Walking or climbing stairs? N -  Dressing or bathing? N N  Doing errands, shopping? N -  Preparing Food and eating ? N -  Using the Toilet? N -  In the past six months, have you accidently leaked urine? N -  Do you have problems with loss of bowel control? N -  Managing your Medications? N -  Managing your Finances? N -  Housekeeping or managing your Housekeeping? N -  Some recent data might be hidden    Patient Care Team: Ria Bush, MD as PCP - General (Family Medicine) Buford Dresser, MD as PCP - Cardiology (Cardiology) Ladene Artist, MD as Consulting Physician  (Gastroenterology) [name], MD as Consulting Physician (General Surgery) Truitt Merle, MD as Consulting Physician (Hematology)   Assessment:   This is a routine wellness examination for Lan.  Exercise Activities and Dietary recommendations Current Exercise Habits: The patient does not participate in regular exercise at present, Exercise limited by: None identified  Goals    . Follow up with Primary Care Provider     Starting 01/09/2017, I will continue to take medications as prescribed and to keep appointments as scheduled with PCP and other specialists.     . Patient Stated     01/15/2019, I will maintain and continue medications as prescribed.     . Quit Smoking       Fall Risk Fall Risk  01/15/2019 01/10/2018 01/09/2017 01/06/2016  Falls in the past year? 0 0 Yes No  Number falls in past yr: 0 - 1 -  Injury with Fall? 0 - No -  Risk for fall due to : Medication side effect - - -  Follow up Falls evaluation completed;Falls prevention discussed - - -   Is the patient's home free of loose throw rugs in walkways, pet beds, electrical cords, etc?   yes      Grab bars in the bathroom? no      Handrails on the stairs?   yes      Adequate lighting?   yes  Timed Get Up and Go Performed: N/A  Depression Screen PHQ 2/9 Scores 01/15/2019 01/10/2018 01/09/2017 01/06/2016  PHQ - 2 Score 0 0 0 0  PHQ- 9 Score 0 0 0 -    Cognitive Function MMSE - Mini Mental State Exam 01/15/2019 01/10/2018 01/09/2017  Orientation to time 5 5 5   Orientation to Place 5 5 5   Registration 3 3 3   Attention/ Calculation 3 0 0  Recall 3 2 3   Recall-comments - unable to recall 1 of 3 words -  Language- name 2 objects - 0 0  Language- repeat 1 1 1   Language- follow 3 step command - 3 3  Language- read & follow direction - 0 0  Write a sentence - 0 0  Copy design - 0 0  Total score - 19 20  Mini Cog  Mini-Cog screen was completed.  Maximum score is 22. A value of 0 denotes this part of the  MMSE was not completed or the patient failed this part of the Mini-Cog screening.       Immunization History  Administered Date(s) Administered  . Fluad Quad(high Dose 65+) 10/30/2018  . Influenza,inj,Quad PF,6+ Mos 12/05/2016, 12/19/2017    Qualifies for Shingles Vaccine? Yes   Screening Tests Health Maintenance  Topic Date Due  . DTaP/Tdap/Td (1 - Tdap) 01/15/2019 (Originally 08/16/1969)  . TETANUS/TDAP  01/15/2019 (Originally 08/16/1969)  . PNA vac Low Risk Adult (1 of 2 - PCV13) 01/15/2019 (Originally 08/17/2015)  . DTAP VACCINES (1) 01/15/2020 (Originally 10/17/1950)  . COLONOSCOPY  05/30/2019  . INFLUENZA VACCINE  Completed  . Hepatitis C Screening  Completed   Cancer Screenings: Lung: Low Dose CT Chest recommended if Age 51-80 years, 30 pack-year currently smoking OR have quit w/in 15years. Patient does qualify. Completed 11/06/2018. Colorectal: completed 05/29/2017  Additional Screenings:  Hepatitis C Screening: 12/30/2015      Plan:   Patient will maintain and continue medications as prescribed.   I have personally reviewed and noted the following in the patient's chart:   . Medical and social history . Use of alcohol, tobacco or illicit drugs  . Current medications and supplements . Functional ability and status . Nutritional status . Physical activity . Advanced directives . List of other physicians . Hospitalizations, surgeries, and ER visits in previous 12 months . Vitals . Screenings to include cognitive, depression, and falls . Referrals and appointments  In addition, I have reviewed and discussed with patient certain preventive protocols, quality metrics, and best practice recommendations. A written personalized care plan for preventive services as well as general preventive health recommendations were provided to patient.     Andrez Grime, LPN  [id number]

## 2019-01-15 NOTE — Addendum Note (Signed)
Addended by: Ellamae Sia on: 01/15/2019 10:10 AM   Modules accepted: Orders

## 2019-01-15 NOTE — Progress Notes (Signed)
PCP notes:  Health Maintenance: No gaps noted   Abnormal Screenings: none   Patient concerns: none   Nurse concerns: none   Next PCP appt.: 01/19/2019 @ 3 pm

## 2019-01-16 LAB — LIPID PANEL
Cholesterol: 172 mg/dL (ref <200)
HDL: 38 mg/dL — ABNORMAL LOW (ref >=40)
LDL Cholesterol (Calc): 108 mg/dL (calc) — ABNORMAL HIGH
Non-HDL Cholesterol (Calc): 134 mg/dL (calc) — ABNORMAL HIGH (ref <130)
Total CHOL/HDL Ratio: 4.5 (calc) (ref <5.0)
Triglycerides: 145 mg/dL (ref <150)

## 2019-01-16 LAB — HEMOGLOBIN A1C
Hgb A1c MFr Bld: 5.5 % of total Hgb (ref <5.7)
Mean Plasma Glucose: 111 (calc)
eAG (mmol/L): 6.2 (calc)

## 2019-01-16 LAB — TSH: TSH: 1.76 mIU/L (ref 0.40–4.50)

## 2019-01-16 LAB — PSA: PSA: 0.5 ng/mL (ref <=4.0)

## 2019-01-19 ENCOUNTER — Ambulatory Visit: Payer: Medicare Other | Admitting: Podiatry

## 2019-01-19 ENCOUNTER — Encounter: Payer: Self-pay | Admitting: Family Medicine

## 2019-01-19 ENCOUNTER — Other Ambulatory Visit: Payer: Self-pay | Admitting: Podiatry

## 2019-01-19 ENCOUNTER — Encounter: Payer: Self-pay | Admitting: Podiatry

## 2019-01-19 ENCOUNTER — Ambulatory Visit (INDEPENDENT_AMBULATORY_CARE_PROVIDER_SITE_OTHER): Payer: Medicare Other | Admitting: Family Medicine

## 2019-01-19 ENCOUNTER — Ambulatory Visit (INDEPENDENT_AMBULATORY_CARE_PROVIDER_SITE_OTHER): Payer: Medicare Other

## 2019-01-19 ENCOUNTER — Other Ambulatory Visit: Payer: Self-pay

## 2019-01-19 ENCOUNTER — Ambulatory Visit: Payer: Medicare Other

## 2019-01-19 VITALS — BP 136/68 | HR 86 | Temp 97.5°F | Ht 64.0 in | Wt 123.0 lb

## 2019-01-19 DIAGNOSIS — R7303 Prediabetes: Secondary | ICD-10-CM

## 2019-01-19 DIAGNOSIS — I700 Atherosclerosis of aorta: Secondary | ICD-10-CM | POA: Diagnosis not present

## 2019-01-19 DIAGNOSIS — Z23 Encounter for immunization: Secondary | ICD-10-CM | POA: Diagnosis not present

## 2019-01-19 DIAGNOSIS — C772 Secondary and unspecified malignant neoplasm of intra-abdominal lymph nodes: Secondary | ICD-10-CM

## 2019-01-19 DIAGNOSIS — Q828 Other specified congenital malformations of skin: Secondary | ICD-10-CM | POA: Diagnosis not present

## 2019-01-19 DIAGNOSIS — N400 Enlarged prostate without lower urinary tract symptoms: Secondary | ICD-10-CM

## 2019-01-19 DIAGNOSIS — I708 Atherosclerosis of other arteries: Secondary | ICD-10-CM

## 2019-01-19 DIAGNOSIS — Z7189 Other specified counseling: Secondary | ICD-10-CM | POA: Diagnosis not present

## 2019-01-19 DIAGNOSIS — Z0000 Encounter for general adult medical examination without abnormal findings: Secondary | ICD-10-CM

## 2019-01-19 DIAGNOSIS — J984 Other disorders of lung: Secondary | ICD-10-CM

## 2019-01-19 DIAGNOSIS — M79671 Pain in right foot: Secondary | ICD-10-CM

## 2019-01-19 DIAGNOSIS — M79672 Pain in left foot: Secondary | ICD-10-CM

## 2019-01-19 DIAGNOSIS — F17200 Nicotine dependence, unspecified, uncomplicated: Secondary | ICD-10-CM | POA: Diagnosis not present

## 2019-01-19 DIAGNOSIS — C187 Malignant neoplasm of sigmoid colon: Secondary | ICD-10-CM

## 2019-01-19 DIAGNOSIS — I6522 Occlusion and stenosis of left carotid artery: Secondary | ICD-10-CM

## 2019-01-19 DIAGNOSIS — E7800 Pure hypercholesterolemia, unspecified: Secondary | ICD-10-CM

## 2019-01-19 NOTE — Assessment & Plan Note (Signed)
Bruit heard today. Latest carotid US reviewed - minimal stenosis. Continued smoker. Desires to wait for upcoming VVS evaluation.

## 2019-01-19 NOTE — Assessment & Plan Note (Signed)
Advanced directive discussion -discussed. Wife would be HCPOA. Packetat home.Would not want prolonged life support. Would be ok with trial for reversible condition. Encouraged he discuss this with wife.

## 2019-01-19 NOTE — Assessment & Plan Note (Signed)
Negative bronchoscopy biopsy (Icard) 10/2018, fungal culture negative as well, path with non-caseating granulomas and small focus of multinucleated giant cells - ?sarcoid.

## 2019-01-19 NOTE — Progress Notes (Signed)
This visit was conducted in person.  BP 136/68   Pulse 86   Temp (!) 97.5 F (36.4 C) (Skin)   Ht 5\' 4"  (1.626 m)   Wt 123 lb (55.8 kg)   SpO2 98%   BMI 21.11 kg/m    CC: CPE Subjective:    Patient ID: Mario Mcdowell, male    DOB: November 19, 1950, 69 y.o.   MRN: [id number]  HPI: Mario Mcdowell is a 69 y.o. male presenting on 01/19/2019 for Annual Exam   Saw health advisor last week for medicare wellness visit. Note reviewed.   No exam data present    Clinical Support from 01/15/2019 in [place] at [facility] Total Score  0      Fall Risk  01/19/2019 01/15/2019 01/10/2018 01/09/2017 01/06/2016  Falls in the past year? 0 0 0 Yes No  Number falls in past yr: - 0 - 1 -  Injury with Fall? - 0 - No -  Risk for fall due to : - Medication side effect - - -  Follow up - Falls evaluation completed;Falls prevention discussed - - -    Known stage IIIB colon cancer s/p hemicolectomy with FOLFOX in remission. 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 Planned rpt CT in 3 months.   Seeing [facility] for aortoiliac occlusive disease with bilateral claudication. Has been cleared by cardiology to proceed with revascularization. Pt has decided to hold off for now. Upcoming [facility] f/u appointment.   Mild carotid stenosis bilaterally.   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 Latest onc note reviewed.  Prostate cancer screening -PSA reassuring, declines DRE.h/o BPH.  Flu shot -yearly prevnar 13 today Tetanus shot -declines  Shingles shot -declines  Advanced directive discussion -discussed. Wife would be HCPOA. Packetat home.Would not want prolonged life support. Would be ok with trial for reversible condition. Encouraged he discuss  this with wife.  Seat belt use discussed.  Sunscreen use discussed, no changing moles on skin.  Smoking - 1/2-1ppd  Alcohol - none (h/o remote use) Dentist - has dentures  Eye exam yearly   Lives with wife, adopted daughter, adopted son [id number] Edu: 8th grade Activity: no regular exercise, but feels has active lifestyle Diet: no water - causes nausea. Drinks 1 gallon milk/day. Fruits/vegetables daily.     Relevant past medical, surgical, family and social history reviewed and updated as indicated. Interim medical history since our last visit reviewed. Allergies and medications reviewed and updated. Outpatient Medications Prior to Visit  Medication Sig Dispense Refill  . aspirin EC 81 MG tablet Take 81 mg by mouth daily.    Marland Kitchen ibuprofen (ADVIL) 200 MG tablet Take 400 mg by mouth every 6 (six) hours as needed for moderate pain.    . rosuvastatin (CRESTOR) 5 MG tablet Take 1 tablet (5 mg total) by mouth daily. 30 tablet   . tiZANidine (ZANAFLEX) 4 MG tablet TAKE 1 TABLET BY MOUTH TWICE DAILY AS NEEDED FOR MUSCLE SPASMS (SEDATION PRECAUTIONS) (Patient taking differently: Take 4 mg by mouth 2 (two) times daily as needed for muscle spasms. ) 30 tablet 0  . ciprofloxacin (CIPRO) 500 MG tablet Take 1 tablet (500 mg total) by mouth 2 (two) times daily. 10 tablet 0  . albuterol (VENTOLIN HFA) 108 (90 Base) MCG/ACT inhaler Inhale  1-2 puffs into the lungs every 4 (four) hours as needed for wheezing or shortness of breath. 6.7 g 6  . 0.9 %  sodium chloride infusion      No facility-administered medications prior to visit.     Per HPI unless specifically indicated in ROS section below Review of Systems  Constitutional: Negative for activity change, appetite change, chills, fatigue, fever and unexpected weight change.  HENT: Negative for hearing loss.   Eyes: Negative for visual disturbance.  Respiratory: Negative for cough, chest tightness, shortness of breath and wheezing.     Cardiovascular: Negative for chest pain, palpitations and leg swelling.  Gastrointestinal: Negative for abdominal distention, abdominal pain, blood in stool, constipation, diarrhea, nausea and vomiting.  Genitourinary: Negative for difficulty urinating and hematuria.  Musculoskeletal: Negative for arthralgias, myalgias and neck pain.  Skin: Negative for rash.  Neurological: Negative for dizziness, seizures, syncope and headaches.  Hematological: Negative for adenopathy. Does not bruise/bleed easily.  Psychiatric/Behavioral: Negative for dysphoric mood. The patient is not nervous/anxious.    Objective:    BP 136/68   Pulse 86   Temp (!) 97.5 F (36.4 C) (Skin)   Ht 5\' 4"  (1.626 m)   Wt 123 lb (55.8 kg)   SpO2 98%   BMI 21.11 kg/m   Wt Readings from Last 3 Encounters:  01/19/19 123 lb (55.8 kg)  11/27/18 118 lb 9.6 oz (53.8 kg)  11/14/18 125 lb (56.7 kg)    Physical Exam Vitals and nursing note reviewed.  Constitutional:      General: He is not in acute distress.    Appearance: Normal appearance. He is well-developed. He is not ill-appearing.  HENT:     Head: Normocephalic and atraumatic.     Right Ear: Hearing, tympanic membrane, ear canal and external ear normal.     Left Ear: Hearing, tympanic membrane, ear canal and external ear normal.     Mouth/Throat:     Pharynx: Uvula midline.  Eyes:     General: No scleral icterus.    Extraocular Movements: Extraocular movements intact.     Conjunctiva/sclera: Conjunctivae normal.     Pupils: Pupils are equal, round, and reactive to light.  Neck:     Vascular: Carotid bruit (L sided) present.  Cardiovascular:     Rate and Rhythm: Normal rate and regular rhythm.     Pulses: Normal pulses.          Radial pulses are 2+ on the right side and 2+ on the left side.     Heart sounds: Normal heart sounds. No murmur.  Pulmonary:     Effort: Pulmonary effort is normal. No respiratory distress.     Breath sounds: Normal breath sounds.  No wheezing, rhonchi or rales.  Abdominal:     General: Abdomen is flat. Bowel sounds are normal. There is no distension.     Palpations: Abdomen is soft. There is no mass.     Tenderness: There is no abdominal tenderness. There is no guarding or rebound.     Hernia: No hernia is present.  Musculoskeletal:        General: Normal range of motion.     Cervical back: Normal range of motion and neck supple.     Right lower leg: No edema.     Left lower leg: No edema.  Lymphadenopathy:     Cervical: No cervical adenopathy.  Skin:    General: Skin is warm and dry.     Findings: No rash.  Neurological:     General: No focal deficit present.     Mental Status: He is alert and oriented to person, place, and time.     Comments: CN grossly intact, station and gait intact  Psychiatric:        Mood and Affect: Mood normal.        Behavior: Behavior normal.        Thought Content: Thought content normal.        Judgment: Judgment normal.       Results for orders placed or performed in visit on 01/15/19  Lipid panel  Result Value Ref Range   Cholesterol 172 <200 mg/dL   HDL 38 (L) > OR = 40 mg/dL   Triglycerides 145 <150 mg/dL   LDL Cholesterol (Calc) 108 (H) mg/dL (calc)   Total CHOL/HDL Ratio 4.5 <5.0 (calc)   Non-HDL Cholesterol (Calc) 134 (H) <130 mg/dL (calc)  TSH  Result Value Ref Range   TSH 1.76 0.40 - 4.50 mIU/L  Hemoglobin A1c  Result Value Ref Range   Hgb A1c MFr Bld 5.5 <5.7 % of total Hgb   Mean Plasma Glucose 111 (calc)   eAG (mmol/L) 6.2 (calc)  PSA  Result Value Ref Range   PSA 0.5 < OR = 4.0 ng/mL   Assessment & Plan:  This visit occurred during the SARS-CoV-2 public health emergency.  Safety protocols were in place, including screening questions prior to the visit, additional usage of staff PPE, and extensive cleaning of exam room while observing appropriate contact time as indicated for disinfecting solutions.   Problem List Items Addressed This Visit     Smoker    Encouraged full smoking cessation . Precontemplative.       Prediabetes    Levels normal this year.       Left carotid stenosis    Bruit heard today. Latest carotid US reviewed - minimal stenosis. Continued smoker. Desires to wait for upcoming [facility] evaluation.       HLD (hyperlipidemia)    Chronic, stable. Continue statin. The 10-year ASCVD risk score Mikey Bussing DC Brooke Bonito., et al., 2013) is: 23%   Values used to calculate the score:     Age: 67 years     Sex: Male     Is Non-Hispanic African American: No     Diabetic: No     Tobacco smoker: Yes     Systolic Blood Pressure: XX123456 mmHg     Is BP treated: No     HDL Cholesterol: 38 mg/dL     Total Cholesterol: 172 mg/dL       Health maintenance examination - Primary    Preventative protocols reviewed and updated unless pt declined. Discussed healthy diet and lifestyle.       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 Regularly sees Dr Burr Medico.       BPH without obstruction/lower urinary tract symptoms    Stable period off medication. PSA normal.       Aorto-iliac atherosclerosis (HCC)    Continue statin, aspirin.      Advanced care planning/counseling discussion    Advanced directive discussion -discussed. Wife would be HCPOA. Packetat home.Would not want prolonged life support. Would be ok with  trial for reversible condition. Encouraged he discuss this with wife.           No orders of the defined types were placed in this encounter.  Orders Placed This Encounter  Procedures  . Pneumococcal conjugate vaccine 13-valent    Patient instructions: Prevnar today.  Continue crestor.  You are doing well today Return as needed or in 1 year for  next physical  Follow up plan: Return in about 1 year (around 01/19/2020) for medicare wellness visit, annual exam, prior fasting for blood work.  Ria Bush, MD

## 2019-01-19 NOTE — Assessment & Plan Note (Signed)
Continue statin, aspirin 

## 2019-01-19 NOTE — Assessment & Plan Note (Signed)
Encouraged full smoking cessation . Precontemplative.

## 2019-01-19 NOTE — Assessment & Plan Note (Signed)
Stable period off medication. PSA normal.

## 2019-01-19 NOTE — Patient Instructions (Addendum)
Prevnar today.  Continue crestor.  You are doing well today Return as needed or in 1 year for next physical  Health Maintenance After Age 69 After age 62, you are at a higher risk for certain long-term diseases and infections as well as injuries from falls. Falls are a major cause of broken bones and head injuries in people who are older than age 47. Getting regular preventive care can help to keep you healthy and well. Preventive care includes getting regular testing and making lifestyle changes as recommended by your health care provider. Talk with your health care provider about:  Which screenings and tests you should have. A screening is a test that checks for a disease when you have no symptoms.  A diet and exercise plan that is right for you. What should I know about screenings and tests to prevent falls? Screening and testing are the best ways to find a health problem early. Early diagnosis and treatment give you the best chance of managing medical conditions that are common after age 80. Certain conditions and lifestyle choices may make you more likely to have a fall. Your health care provider may recommend:  Regular vision checks. Poor vision and conditions such as cataracts can make you more likely to have a fall. If you wear glasses, make sure to get your prescription updated if your vision changes.  Medicine review. Work with your health care provider to regularly review all of the medicines you are taking, including over-the-counter medicines. Ask your health care provider about any side effects that may make you more likely to have a fall. Tell your health care provider if any medicines that you take make you feel dizzy or sleepy.  Osteoporosis screening. Osteoporosis is a condition that causes the bones to get weaker. This can make the bones weak and cause them to break more easily.  Blood pressure screening. Blood pressure changes and medicines to control blood pressure can make  you feel dizzy.  Strength and balance checks. Your health care provider may recommend certain tests to check your strength and balance while standing, walking, or changing positions.  Foot health exam. Foot pain and numbness, as well as not wearing proper footwear, can make you more likely to have a fall.  Depression screening. You may be more likely to have a fall if you have a fear of falling, feel emotionally low, or feel unable to do activities that you used to do.  Alcohol use screening. Using too much alcohol can affect your balance and may make you more likely to have a fall. What actions can I take to lower my risk of falls? General instructions  Talk with your health care provider about your risks for falling. Tell your health care provider if: ? You fall. Be sure to tell your health care provider about all falls, even ones that seem minor. ? You feel dizzy, sleepy, or off-balance.  Take over-the-counter and prescription medicines only as told by your health care provider. These include any supplements.  Eat a healthy diet and maintain a healthy weight. A healthy diet includes low-fat dairy products, low-fat (lean) meats, and fiber from whole grains, beans, and lots of fruits and vegetables. Home safety  Remove any tripping hazards, such as rugs, cords, and clutter.  Install safety equipment such as grab bars in bathrooms and safety rails on stairs.  Keep rooms and walkways well-lit. Activity   Follow a regular exercise program to stay fit. This will help you maintain  your balance. Ask your health care provider what types of exercise are appropriate for you.  If you need a cane or walker, use it as recommended by your health care provider.  Wear supportive shoes that have nonskid soles. Lifestyle  Do not drink alcohol if your health care provider tells you not to drink.  If you drink alcohol, limit how much you have: ? 0-1 drink a day for women. ? 0-2 drinks a day for  men.  Be aware of how much alcohol is in your drink. In the [place] one drink equals one typical bottle of beer (12 oz), one-half glass of wine (5 oz), or one shot of hard liquor (1 oz).  Do not use any products that contain nicotine or tobacco, such as cigarettes and e-cigarettes. If you need help quitting, ask your health care provider. Summary  Having a healthy lifestyle and getting preventive care can help to protect your health and wellness after age 25.  Screening and testing are the best way to find a health problem early and help you avoid having a fall. Early diagnosis and treatment give you the best chance for managing medical conditions that are more common for people who are older than age 57.  Falls are a major cause of broken bones and head injuries in people who are older than age 73. Take precautions to prevent a fall at home.  Work with your health care provider to learn what changes you can make to improve your health and wellness and to prevent falls. This information is not intended to replace advice given to you by your health care provider. Make sure you discuss any questions you have with your health care provider. Document Revised: 04/24/2018 Document Reviewed: 11/14/2016 Elsevier Patient Education  2020 [employer].

## 2019-01-19 NOTE — Progress Notes (Signed)
Subjective:   Patient ID: Mario Mcdowell, male   DOB: 68 y.o.   MRN: [id number]   HPI Patient presents with chronic lesions underneath the fifth metatarsal of both feet third digit right that are very painful and make walking difficult.  Patient has had a history of vascular issues and does see a vascular physician   ROS      Objective:  Physical Exam  Vascular status was found to be diminished with palpable DP pulses but concerns about prior history with patient not having seen his doctor for 18 months.  Patient is found to have severe lesions plantar fifth metatarsal of both feet that are painful when pressed and is noted to have lesion distal third digit right that is painful     Assessment:  At risk patient who does have severe lesions underneath fifth metatarsal at the bilateral that are very difficult to live with and lesion third digit right with what appears to be moderate vascular disease F2     Plan:  H&P reviewed condition and I recommended debridement today with consideration for fifth metatarsal head resections if we can get approval from vascular doctor who he is going to see in the next several weeks.  He will have to be retested and then we can make a determination  X-rays indicate that there is some reactivity of bone but no indications of significant pathology but does need fifth metatarsal head resection

## 2019-01-19 NOTE — Assessment & Plan Note (Signed)
Chronic, stable. Continue statin. The 10-year ASCVD risk score Mario Bussing DC Brooke Bonito., et al., 2013) is: 23%   Values used to calculate the score:     Age: 69 years     Sex: Male     Is Non-Hispanic African American: No     Diabetic: No     Tobacco smoker: Yes     Systolic Blood Pressure: [id number] mmHg     Is BP treated: No     HDL Cholesterol: 38 mg/dL     Total Cholesterol: 172 mg/dL

## 2019-01-19 NOTE — Assessment & Plan Note (Signed)
Levels normal this year.

## 2019-01-19 NOTE — Assessment & Plan Note (Signed)
Appreciate onc care. Regularly sees Dr Burr Medico.

## 2019-01-19 NOTE — Assessment & Plan Note (Signed)
Preventative protocols reviewed and updated unless pt declined. Discussed healthy diet and lifestyle.  

## 2019-01-27 ENCOUNTER — Ambulatory Visit: Payer: Medicare Other | Admitting: Podiatry

## 2019-02-07 ENCOUNTER — Other Ambulatory Visit: Payer: Self-pay | Admitting: Family Medicine

## 2019-02-27 DIAGNOSIS — H5213 Myopia, bilateral: Secondary | ICD-10-CM | POA: Diagnosis not present

## 2019-03-03 ENCOUNTER — Telehealth (HOSPITAL_COMMUNITY): Payer: Self-pay

## 2019-03-03 ENCOUNTER — Other Ambulatory Visit: Payer: Self-pay

## 2019-03-03 ENCOUNTER — Ambulatory Visit (INDEPENDENT_AMBULATORY_CARE_PROVIDER_SITE_OTHER)
Admission: RE | Admit: 2019-03-03 | Discharge: 2019-03-03 | Disposition: A | Discharge status: Home / Self Care | Payer: Medicare Other | Source: Ambulatory Visit | Attending: Pulmonary Disease | Admitting: Pulmonary Disease

## 2019-03-03 DIAGNOSIS — J984 Other disorders of lung: Secondary | ICD-10-CM

## 2019-03-03 DIAGNOSIS — J8410 Pulmonary fibrosis, unspecified: Secondary | ICD-10-CM

## 2019-03-03 DIAGNOSIS — R0989 Other specified symptoms and signs involving the circulatory and respiratory systems: Secondary | ICD-10-CM

## 2019-03-03 DIAGNOSIS — J181 Lobar pneumonia, unspecified organism: Secondary | ICD-10-CM | POA: Diagnosis not present

## 2019-03-03 NOTE — Telephone Encounter (Signed)

## 2019-03-04 ENCOUNTER — Other Ambulatory Visit (HOSPITAL_COMMUNITY): Payer: Self-pay | Admitting: Vascular Surgery

## 2019-03-04 ENCOUNTER — Ambulatory Visit (INDEPENDENT_AMBULATORY_CARE_PROVIDER_SITE_OTHER): Payer: Medicare Other | Admitting: Vascular Surgery

## 2019-03-04 ENCOUNTER — Ambulatory Visit (INDEPENDENT_AMBULATORY_CARE_PROVIDER_SITE_OTHER)
Admission: RE | Admit: 2019-03-04 | Discharge: 2019-03-04 | Disposition: A | Discharge status: Home / Self Care | Payer: Medicare Other | Source: Ambulatory Visit | Attending: Vascular Surgery | Admitting: Vascular Surgery

## 2019-03-04 ENCOUNTER — Ambulatory Visit (HOSPITAL_COMMUNITY)
Admission: RE | Admit: 2019-03-04 | Discharge: 2019-03-04 | Disposition: A | Discharge status: Home / Self Care | Payer: Medicare Other | Source: Ambulatory Visit | Attending: Vascular Surgery | Admitting: Vascular Surgery

## 2019-03-04 ENCOUNTER — Encounter: Payer: Self-pay | Admitting: Vascular Surgery

## 2019-03-04 VITALS — BP 122/71 | HR 54 | Temp 98.0°F | Resp 20 | Ht 64.0 in | Wt 122.8 lb

## 2019-03-04 DIAGNOSIS — I6522 Occlusion and stenosis of left carotid artery: Secondary | ICD-10-CM

## 2019-03-04 DIAGNOSIS — I739 Peripheral vascular disease, unspecified: Secondary | ICD-10-CM | POA: Diagnosis not present

## 2019-03-04 DIAGNOSIS — R0989 Other specified symptoms and signs involving the circulatory and respiratory systems: Secondary | ICD-10-CM | POA: Diagnosis not present

## 2019-03-04 NOTE — Progress Notes (Signed)
Patient name: Mario Mcdowell MRN: [id number] DOB: [birth year] Sex: male  REASON FOR VISIT:   Follow-up of aortic occlusion and carotid disease.  HPI:   Mario Mcdowell is a pleasant 69 y.o. male who back in December 2018 had a 40 to 59% left carotid stenosis.  He was asymptomatic.  On his most recent follow-up a year ago he had a less than 39% stenosis bilaterally.  Comes in for 1 year follow-up visit.  In addition, the patient has a history of an aortic occlusion and stable claudication.  He has follow-up ABIs today.  With respect to his carotid disease, he denies any history of stroke, TIAs, expressive or receptive aphasia, or amaurosis fugax.  He has stable hip claudication bilaterally.  He can walk approximately 1-2 city blocks before experiencing hip claudication.  Denies any significant thigh or calf claudication.  His symptoms have been stable over the last year.  His symptoms are brought on by ambulation and relieved with rest.  He denies any history of rest pain or nonhealing ulcers.  He is on aspirin and is on a statin.  He smokes a pack per day of cigarettes and has been smoking since he was 69 years old.  Past Medical History:  Diagnosis Date  . Aorto-iliac atherosclerosis ([place])    by CT; in bilateral legs   . Arthritis   . Atherosclerotic PVD with intermittent claudication ([place])   . BPH without obstruction/lower urinary tract symptoms 10/25/2015  . Centrilobular emphysema ([place])    per chest CT 04-23-2016  . Colon adenocarcinoma [facility]) oncologist- dr Burr Medico   dx 05-21-2016 -- Stage IIIB (pT3,pN1,M0) , Invasive sigmoid adenocarcinoma well to moderately differentiated with metastatic intra-abdominal lymph node (1 out of 22), s/p lap. partial colectomy [name]) 05/21/2016 and chemotherapy is planned  . Full dentures   . History of kidney stones 07/2015   passed without intervention  . Pulmonary nodules    per chest CT 04-23-2016 bilateral nodules (consistent w/ granulomatous  disease)  . Smokers' cough ([place])   . Wears contact lenses     Family History  Problem Relation Age of Onset  . Cancer Paternal Aunt        unsure details  . Diabetes Cousin   . Cancer Paternal Uncle        unknow type cancer   . Prostate cancer Neg Hx   . Kidney cancer Neg Hx   . CAD Neg Hx   . Stroke Neg Hx   . Colon cancer Neg Hx     SOCIAL HISTORY: Social History   Tobacco Use  . Smoking status: Current Every Day Smoker    Packs/day: 1.00    Years: 46.00    Pack years: 46.00    Types: Cigarettes  . Smokeless tobacco: Never Used  . Tobacco comment: 3/4 pack daily  Substance Use Topics  . Alcohol use: No    Comment: quit 2000    Allergies  Allergen Reactions  . Penicillins Swelling    Did it involve swelling of the face/tongue/throat, SOB, or low BP? Yes Did it involve sudden or severe rash/hives, skin peeling, or any reaction on the inside of your mouth or nose? No Did you need to seek medical attention at a hospital or doctor's office? No When did it last happen?childhood allergy If all above answers are "NO", may proceed with cephalosporin use.    . Lipitor [Atorvastatin] Nausea Only    Current Outpatient Medications  Medication Sig Dispense Refill  .  aspirin EC 81 MG tablet Take 81 mg by mouth daily.    Marland Kitchen ibuprofen (ADVIL) 200 MG tablet Take 400 mg by mouth every 6 (six) hours as needed for moderate pain.    . rosuvastatin (CRESTOR) 5 MG tablet Take 1 tablet by mouth once daily 30 tablet 11  . tiZANidine (ZANAFLEX) 4 MG tablet TAKE 1 TABLET BY MOUTH TWICE DAILY AS NEEDED FOR MUSCLE SPASMS (SEDATION PRECAUTIONS) (Patient taking differently: Take 4 mg by mouth 2 (two) times daily as needed for muscle spasms. ) 30 tablet 0  . albuterol (VENTOLIN HFA) 108 (90 Base) MCG/ACT inhaler Inhale 1-2 puffs into the lungs every 4 (four) hours as needed for wheezing or shortness of breath. 6.7 g 6   No current facility-administered medications for this visit.     REVIEW OF SYSTEMS:  [X]  denotes positive finding, [ ]  denotes negative finding Cardiac  Comments:  Chest pain or chest pressure:    Shortness of breath upon exertion:    Short of breath when lying flat:    Irregular heart rhythm:        Vascular    Pain in calf, thigh, or hip brought on by ambulation:    Pain in feet at night that wakes you up from your sleep:     Blood clot in your veins:    Leg swelling:         Pulmonary    Oxygen at home:    Productive cough:     Wheezing:         Neurologic    Sudden weakness in arms or legs:     Sudden numbness in arms or legs:     Sudden onset of difficulty speaking or slurred speech:    Temporary loss of vision in one eye:     Problems with dizziness:         Gastrointestinal    Blood in stool:     Vomited blood:         Genitourinary    Burning when urinating:     Blood in urine:        Psychiatric    Major depression:         Hematologic    Bleeding problems:    Problems with blood clotting too easily:        Skin    Rashes or ulcers:        Constitutional    Fever or chills:     PHYSICAL EXAM:   Vitals:   03/04/19 1258 03/04/19 1301  BP: 105/67 122/71  Pulse: (!) 54   Resp: 20   Temp: 98 F (36.7 C)   SpO2: 98%   Weight: 122 lb 12.8 oz (55.7 kg)   Height: 5\' 4"  (1.626 m)     GENERAL: The patient is a well-nourished male, in no acute distress. The vital signs are documented above. CARDIAC: There is a regular rate and rhythm.  VASCULAR: I do not detect carotid bruits. I cannot palpate femoral, popliteal, or pedal pulses bilaterally. Both feet appear adequately perfused.  They have good temperature. He has no significant lower extremity swelling. PULMONARY: There is good air exchange bilaterally without wheezing or rales. ABDOMEN: Soft and non-tender with normal pitched bowel sounds.  MUSCULOSKELETAL: There are no major deformities or cyanosis. NEUROLOGIC: No focal weakness or paresthesias are  detected. SKIN: There are no ulcers or rashes noted. PSYCHIATRIC: The patient has a normal affect.  DATA:    ARTERIAL DOPPLER:  I have independently interpreted his arterial Doppler study today.  On the right side he has a monophasic dorsalis pedis and posterior tibial signal.  His ABI is 59%.  Toe pressure is 57 mmHg.  On the left side he has a monophasic dorsalis pedis and posterior tibial signal.  ABI is 51%.  Toe pressure is 44 mmHg.  CAROTID DUPLEX: I have independently interpreted his carotid duplex scan today.  On the right side he has a less than 39% stenosis.  The right vertebral artery is patent with antegrade flow.  On the left side he has a less than 39% stenosis.  The left vertebral artery has atypical flow.  There was some turbulent flow in the left subclavian artery with mildly elevated velocities.  MEDICAL ISSUES:   AORTIC OCCLUSION: This patient has a known aortic occlusion.  His ABIs remained stable.  He has stable hip claudication bilaterally.  He understands we would not consider revascularization unless he developed disabling claudication, rest pain, or nonhealing ulcer.  He has significant calcification of his aorta and therefore his his best option for revascularization would be a right axillobifemoral bypass.  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  We have again discussed the importance of tobacco cessation.  I have encouraged him to stay as active as possible.  We also discussed the importance of nutrition.  I ordered follow-up ABIs in 1 year and I will see him back at that time.  He is on aspirin and is on a statin.  Of note his podiatrist was considering doing surgery on his feet however given his known aortic occlusion with marginal perfusion distally I would not recommend any surgery on the feet given the risk of nonhealing.  HISTORY OF LEFT CAROTID STENOSIS: His left carotid stenosis has resolved.  He has a less than 39% stenosis bilaterally.  He is  asymptomatic.  I have stretched his follow-up out to 2 years.  I will order a follow-up carotid duplex scan in 2 years.  Deitra Mayo [facility] [phone]

## 2019-03-06 ENCOUNTER — Other Ambulatory Visit: Payer: Self-pay | Admitting: *Deleted

## 2019-03-06 DIAGNOSIS — R0989 Other specified symptoms and signs involving the circulatory and respiratory systems: Secondary | ICD-10-CM

## 2019-03-06 DIAGNOSIS — I739 Peripheral vascular disease, unspecified: Secondary | ICD-10-CM

## 2019-03-07 ENCOUNTER — Encounter (HOSPITAL_COMMUNITY): Payer: Medicare Other

## 2019-03-11 NOTE — Progress Notes (Signed)
LR - please schedule patient for follow-up with CT Chest without contrast in September 2021 and follow-up in clinic with me  Updated patient on results with improved but persistent consolidation in RUL. Will arrange follow-up with imaging in September 2021.  Rodman Pickle, M.D. [facility] Pulmonary/Critical Care Medicine 03/11/2019 3:43 PM

## 2019-03-13 ENCOUNTER — Other Ambulatory Visit: Payer: Self-pay | Admitting: Pulmonary Disease

## 2019-03-13 DIAGNOSIS — J984 Other disorders of lung: Secondary | ICD-10-CM

## 2019-03-27 ENCOUNTER — Other Ambulatory Visit: Payer: Self-pay | Admitting: Family Medicine

## 2019-03-27 NOTE — Telephone Encounter (Signed)
Tizanidine Last filled:  10/17/18, #30 Last OV:  01/19/19, AWV prt 2 Next OV:  none

## 2019-04-20 ENCOUNTER — Other Ambulatory Visit: Payer: Self-pay

## 2019-04-20 ENCOUNTER — Other Ambulatory Visit: Payer: Medicare Other | Admitting: Orthotics

## 2019-04-20 DIAGNOSIS — L84 Corns and callosities: Secondary | ICD-10-CM

## 2019-04-21 ENCOUNTER — Encounter: Payer: Self-pay | Admitting: Family Medicine

## 2019-04-21 ENCOUNTER — Ambulatory Visit (INDEPENDENT_AMBULATORY_CARE_PROVIDER_SITE_OTHER): Payer: Medicare Other | Admitting: Family Medicine

## 2019-04-21 VITALS — BP 110/66 | HR 67 | Temp 97.8°F | Ht 64.0 in | Wt 119.1 lb

## 2019-04-21 DIAGNOSIS — G4762 Sleep related leg cramps: Secondary | ICD-10-CM

## 2019-04-21 DIAGNOSIS — J984 Other disorders of lung: Secondary | ICD-10-CM | POA: Diagnosis not present

## 2019-04-21 DIAGNOSIS — M545 Low back pain, unspecified: Secondary | ICD-10-CM

## 2019-04-21 DIAGNOSIS — R109 Unspecified abdominal pain: Secondary | ICD-10-CM | POA: Diagnosis not present

## 2019-04-21 DIAGNOSIS — M549 Dorsalgia, unspecified: Secondary | ICD-10-CM | POA: Insufficient documentation

## 2019-04-21 LAB — POC URINALSYSI DIPSTICK (AUTOMATED)
Bilirubin, UA: NEGATIVE
Blood, UA: NEGATIVE
Glucose, UA: NEGATIVE
Ketones, UA: NEGATIVE
Leukocytes, UA: NEGATIVE
Nitrite, UA: NEGATIVE
Protein, UA: NEGATIVE
Spec Grav, UA: >=1.03 — AB (ref 1.010–1.025)
Urobilinogen, UA: 0.2 E.U./dL
pH, UA: 5.5 (ref 5.0–8.0)

## 2019-04-21 NOTE — Patient Instructions (Addendum)
I think you have muscle spasm and strain of lat dorsi muscles of the lower back.  Treat with heating pad, tizanidine muscle relaxant, gentle stretching of the back. Ok to continue tylenol and/or ibuprofen.  Increase water and stretching to help night time leg cramps.  Urine was concentrated but didn't have any blood. I don't think this is a kidney stone.  Let us know if not improving with this.

## 2019-04-21 NOTE — Assessment & Plan Note (Signed)
Discussed importance of good water intake and night time stretching. Continue tizanidine PRN.

## 2019-04-21 NOTE — Progress Notes (Signed)
This visit was conducted in person.  BP 110/66 (BP Location: Left Arm, Patient Position: Sitting, Cuff Size: Normal)   Pulse 67   Temp 97.8 F (36.6 C) (Temporal)   Ht 5\' 4"  (1.626 m)   Wt 119 lb 2 oz (54 kg)   SpO2 95%   BMI 20.45 kg/m    CC: bilat flank pain Subjective:    Patient ID: Mario Mcdowell, male    DOB: [birth year], 70 y.o.   MRN: [id number]  HPI: Mario Mcdowell is a 69 y.o. male presenting on 04/21/2019 for Flank Pain (C/o bilateral flank pain, worsening.  Started about 1 wk ago.  H/o kidney stone. )   1 wk h/o bilateral side pain.  Worse pain with bending to pick something up from the ground.  Better when supine.  Ibuprofen 400mg  and tylenol 1000mg  didn't help.  H/o kidney stones remotely.  No abd pain, nausea/vomiting, diarrhea/constipation, blood in stool or urine, urinary symptoms.  Denies inciting falls/injury or trauma. No recent heavy lifting, no repetitive exercise. Weed eating aggravated the back on Sunday.  Ongoing nocturnal calf cramping worse at night. Tizanidine 4mg  not helpful for this or for current side pain.   Known stage IIIB colon cancer s/p hemicolectomy with FOLFOX in remission. 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 Rpt CT 02/2019 (slightly decreased). Planned rpt 6 mo.   Known aortoiliac occlusive disease with bilateral claudication. Was cleared by cardiology to proceed with revascularization however pt decided to postpone at this time. Endorses claudication symptoms - bilateral hip pain with walking, better with sitting and resting.   Current 1 ppd smoker.      Relevant past medical, surgical, family and social history reviewed and updated as indicated. Interim medical history since our last visit reviewed. Allergies and medications reviewed and updated. Outpatient Medications Prior to Visit  Medication Sig Dispense Refill  . albuterol  (VENTOLIN HFA) 108 (90 Base) MCG/ACT inhaler Inhale 1-2 puffs into the lungs every 4 (four) hours as needed for wheezing or shortness of breath. 6.7 g 6  . aspirin EC 81 MG tablet Take 81 mg by mouth daily.    Marland Kitchen ibuprofen (ADVIL) 200 MG tablet Take 400 mg by mouth every 6 (six) hours as needed for moderate pain.    . rosuvastatin (CRESTOR) 5 MG tablet Take 1 tablet by mouth once daily 30 tablet 11  . tiZANidine (ZANAFLEX) 4 MG tablet Take 1 tablet (4 mg total) by mouth 2 (two) times daily as needed for muscle spasms. 30 tablet 1   No facility-administered medications prior to visit.     Per HPI unless specifically indicated in ROS section below Review of Systems Objective:    BP 110/66 (BP Location: Left Arm, Patient Position: Sitting, Cuff Size: Normal)   Pulse 67   Temp 97.8 F (36.6 C) (Temporal)   Ht 5\' 4"  (1.626 m)   Wt 119 lb 2 oz (54 kg)   SpO2 95%   BMI 20.45 kg/m   Wt Readings from Last 3 Encounters:  04/21/19 119 lb 2 oz (54 kg)  03/04/19 122 lb 12.8 oz (55.7 kg)  01/19/19 123 lb (55.8 kg)    Physical Exam Vitals and nursing note reviewed.  Constitutional:      Appearance: Normal appearance. He is not ill-appearing.  Abdominal:     General: Abdomen is flat. Bowel sounds are normal. There is no distension.  Palpations: Abdomen is soft. There is no mass.     Tenderness: There is no abdominal tenderness. There is no right CVA tenderness, left CVA tenderness, guarding or rebound.     Hernia: No hernia is present.  Musculoskeletal:        General: Normal range of motion.       Back:     Right lower leg: No edema.     Left lower leg: No edema.     Comments:  No pain midline spine No paraspinous mm tenderness but some upper lumbar tightness noted Points to bilateral lateral back at lat dorsi as site of pain - no pain on palpation of area but reproducible with activation of back muscles when sitting up from supine  Neg SLR bilaterally. No pain with int/ext rotation  at hip. Neg FABER. No pain at SIJ, GTB or sciatic notch bilaterally.   Skin:    General: Skin is warm and dry.     Capillary Refill: Capillary refill takes less than 2 seconds.     Findings: No rash.  Neurological:     Mental Status: He is alert.     Sensory: Sensation is intact.     Motor: Motor function is intact.     Coordination: Coordination is intact.     Gait: Gait is intact.     Deep Tendon Reflexes:     Reflex Scores:      Patellar reflexes are 2+ on the right side and 2+ on the left side.    Comments:  5/5 strength BLE Sensation intact Able to heel and toe walk  Psychiatric:        Mood and Affect: Mood normal.        Behavior: Behavior normal.       Results for orders placed or performed in visit on 04/21/19  POCT Urinalysis Dipstick (Automated)  Result Value Ref Range   Color, UA yellow    Clarity, UA clear    Glucose, UA Negative Negative   Bilirubin, UA negative    Ketones, UA negative    Spec Grav, UA >=1.030 (A) 1.010 - 1.025   Blood, UA negative    pH, UA 5.5 5.0 - 8.0   Protein, UA Negative Negative   Urobilinogen, UA 0.2 0.2 or 1.0 E.U./dL   Nitrite, UA negative    Leukocytes, UA Negative Negative   Assessment & Plan:  This visit occurred during the SARS-CoV-2 public health emergency.  Safety protocols were in place, including screening questions prior to the visit, additional usage of staff PPE, and extensive cleaning of exam room while observing appropriate contact time as indicated for disinfecting solutions.   Problem List Items Addressed This Visit    Nocturnal leg cramps    Discussed importance of good water intake and night time stretching. Continue tizanidine PRN.       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 Rpt CT 02/2019 - mildly decreased size of mass.       Acute bilateral back pain - Primary    Describes 1 week of bilateral lateral lower back pain that is positional in nature - anticipate MSK cause like lat dorsi  strain. Not consistent with kidney stone and UA negative for hematuria. Overall benign exam. Will treat with tizanidine at home and heating pad. Update if not improving, in cancer history low threshold to image - however no midline spine tenderness noted today.        Other Visit  Diagnoses    Flank pain       Relevant Orders   POCT Urinalysis Dipstick (Automated) (Completed)       No orders of the defined types were placed in this encounter.  Orders Placed This Encounter  Procedures  . POCT Urinalysis Dipstick (Automated)    Patient Instructions  I think you have muscle spasm and strain of lat dorsi muscles of the lower back.  Treat with heating pad, tizanidine muscle relaxant, gentle stretching of the back. Ok to continue tylenol and/or ibuprofen.  Increase water and stretching to help night time leg cramps.  Urine was concentrated but didn't have any blood. I don't think this is a kidney stone.  Let us know if not improving with this.    Follow up plan: Return if symptoms worsen or fail to improve.  Ria Bush, MD

## 2019-04-21 NOTE — Assessment & Plan Note (Addendum)
?  sarcoid. Rpt CT 02/2019 - mildly decreased size of mass.

## 2019-04-21 NOTE — Assessment & Plan Note (Addendum)
Describes 1 week of bilateral lateral lower back pain that is positional in nature - anticipate MSK cause like lat dorsi strain. Not consistent with kidney stone and UA negative for hematuria. Overall benign exam. Will treat with tizanidine at home and heating pad. Update if not improving, in cancer history low threshold to image - however no midline spine tenderness noted today.

## 2019-04-22 ENCOUNTER — Encounter: Payer: Self-pay | Admitting: Podiatry

## 2019-04-22 ENCOUNTER — Ambulatory Visit: Payer: Medicare Other | Admitting: Podiatry

## 2019-04-22 ENCOUNTER — Other Ambulatory Visit: Payer: Self-pay

## 2019-04-22 DIAGNOSIS — L84 Corns and callosities: Secondary | ICD-10-CM

## 2019-04-22 DIAGNOSIS — M779 Enthesopathy, unspecified: Secondary | ICD-10-CM | POA: Diagnosis not present

## 2019-04-22 DIAGNOSIS — Q828 Other specified congenital malformations of skin: Secondary | ICD-10-CM

## 2019-04-22 NOTE — Progress Notes (Signed)
   This patient returns to the office with painful calluses on the outside ball of both feet.  He was seen recently by Liliane Channel and he was measured for a new shoes.  At that time he was referred back to me for treatment of his painful callus.  He had previously been seen by Dr. Paulla Dolly for surgical evaluation of his feet.  Dr. Paulla Dolly felt that his circulation was not adequate for surgery.  Dr. Paulla Dolly believes he needs to get an approval from his vascular doctor if he wants to consider surgery.  He says that he has significant pain and discomfort walking wearing his shoes under the outside ball of both feet.  He has been seen routinely for chronic porokeratosis which have been trimmed through this office.  He presents the office today for continued evaluation and treatment of his painful skin lesions.  Vascular  Dorsalis pedis and posterior tibial pulses are palpable  B/L.  Capillary return  WNL.  Temperature gradient is  WNL.  Skin turgor  WNL  Sensorium  Senn Weinstein monofilament wire  WNL. Normal tactile sensation.  Nail Exam  Patient has normal nails with no evidence of bacterial or fungal infection.  Orthopedic  Exam  Muscle tone and muscle strength  WNL.  No limitations of motion feet  B/L.  No crepitus or joint effusion noted.  Foot type is unremarkable and digits show no abnormalities.  Plantar flexed fifth metatarsal  B/L.  Skin  No open lesions.  Normal skin texture and turgor.   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  Debride porokeratosis with # 15 blade.  Patient states that he talked with a representative from his insurance and the representative said that the shoes are covered upon  proper documentation.  Dawn was then summoned and she discussed this matter with this patient.  RTC 3 months for skin lesion treatment.   [name] DPM  .

## 2019-04-28 ENCOUNTER — Telehealth: Payer: Self-pay

## 2019-04-28 NOTE — Telephone Encounter (Signed)
Noted  

## 2019-04-28 NOTE — Telephone Encounter (Signed)
[place] Night - Client TELEPHONE ADVICE RECORD [facility] Patient Name: Mario Mcdowell Gender: Male DOB: [birth year] Age: 69 Y 65 M 10 D Return Phone Number: [phone] (Primary) Address: City/State/Zip: [place] [address] Client [facility] Night - Client Client Site [place] - Night Contact Type Call Who Is Calling Patient / Member / Family / Caregiver Call Type Triage / Clinical Caller Name Rosann Auerbach Relationship To Patient Spouse Return Phone Number [phone] (Primary) Chief Complaint Foot or Ankle Injury Reason for Call Symptomatic / Request for Health Information Initial Comment Pt stepped off of a ladder and twisted their ankle. Translation No Nurse Assessment Nurse: Ottis Stain, RN, Sherrie Date/Time (Eastern Time): 04/27/2019 5:45:35 PM Confirm and document reason for call. If symptomatic, describe symptoms. ---Caller states husband stepped off of a ladder and twisted left ankle. Swelling and bruising between toes and ankle. +movement of toes, +feeling in toes. Painful to put pressure on foot. Has the patient had close contact with a person known or suspected to have the novel coronavirus illness OR traveled / lives in area with major community spread (including international travel) in the last 14 days from the onset of symptoms? * If Asymptomatic, screen for exposure and travel within the last 14 days. ---No Does the patient have any new or worsening symptoms? ---Yes Will a triage be completed? ---Yes Related visit to physician within the last 2 weeks? ---N/A Does the PT have any chronic conditions? (i.e. diabetes, asthma, this includes High risk factors for pregnancy, etc.) ---No Is this a behavioral health or substance abuse call? ---No Guidelines Guideline Title Affirmed Question Affirmed Notes Nurse Date/Time Eilene Ghazi Time) Ankle and Foot Injury [1] Limp when walking AND [2] due to a  twisted ankle or foot Ottis Stain, RN, Sherrie 04/27/2019 5:49:07 PM Disp. Time Eilene Ghazi Time) Disposition Final User 04/27/2019 5:57:28 PM See PCP within 24 Hours Yes Ottis Stain, RN, Sherrie PLEASE NOTE: All timestamps contained within this report are represented as [place] Standard Time. CONFIDENTIALTY NOTICE: This fax transmission is intended only for the addressee. It contains information that is legally privileged, confidential or otherwise protected from use or disclosure. If you are not the intended recipient, you are strictly prohibited from reviewing, disclosing, copying using or disseminating any of this information or taking any action in reliance on or regarding this information. If you have received this fax in error, please notify us immediately by telephone so that we can arrange for its return to [place]. Phone: [phone], Toll-Free: [phone], Fax: [phone] Page: 2 of 2 Call Id: [id number] [place] Disagree/Comply Comply Caller Understands Yes PreDisposition Home Care Care Advice Given Per Guideline SEE PCP WITHIN 24 HOURS: * IF OFFICE WILL BE OPEN: You need to be seen within the next 24 hours. Call your doctor (or NP/PA) when the office opens and make an appointment. TREATMENT OF MILD SPRAINS (E.G., MILD SPRAINED ANKLE): * Use R.I.C.E. (rest, ice, compression, and elevation) for the first 24 to 48 hours. PAIN MEDICINES: * For pain relief, you can take either acetaminophen, ibuprofen, or naproxen. * Continue to apply crushed ICE in a plastic bag for 10-20 minutes every hour for the first 4 hours. Then apply ice for 10-20 minutes 4 times a day for the first two days. * Apply COMPRESSION with a snug, elastic bandage for 48 hours. Numbness, tingling, or increased pain means the bandage is too tight. * Keep injured ankle or foot ELEVATED and at rest for 24 hours. * After 24  hours of REST, allow any activity that doesn't cause pain. * IBUPROFEN (E.G., MOTRIN, ADVIL): Take 400 mg (two 200 mg  pills) by mouth every 6 hours. The most you should take each day is 1,200 mg (six 200 mg pills), unless your doctor has told you to take more. * ACETAMINOPHEN - REGULAR STRENGTH TYLENOL: Take 650 mg (two 325 mg pills) by mouth every 4 to 6 hours as needed. Each Regular Strength Tylenol pill has 325 mg of acetaminophen. The most you should take each day is 3,250 mg (10 pills a day). CALL BACK IF: * Severe pain persists longer than 2 hours after pain medicine and ice After Care Instructions Given Call Event Type User Date / Time Description Education document email Ottis Stain, RN, Dondra Spry 04/27/2019 5:56:27 PM Leg Injury Referrals REFERRED TO PCP OFFICE

## 2019-04-28 NOTE — Telephone Encounter (Signed)
I spoke with pt; pt did not go to UC or ED; pt said he spoke with a nurse last night and followed her instructions and it feels a lot better today pt is up and moving around with some soreness but a lot better and pt does not feel he needs an appt but will cb if does not get back to normal. FYI to Dr Darnell Level.

## 2019-05-26 ENCOUNTER — Ambulatory Visit: Payer: Medicare Other | Admitting: Orthotics

## 2019-05-26 ENCOUNTER — Other Ambulatory Visit: Payer: Self-pay

## 2019-05-26 DIAGNOSIS — L84 Corns and callosities: Secondary | ICD-10-CM

## 2019-05-26 DIAGNOSIS — Q828 Other specified congenital malformations of skin: Secondary | ICD-10-CM

## 2019-05-26 NOTE — Progress Notes (Addendum)
Patient picked up self-pay shoes and inserts.  Codes A5500 (DME) for the shoes, and [id number] (DME) for the inserts x 2 each for shoe and inserts.

## 2019-06-01 ENCOUNTER — Other Ambulatory Visit: Payer: Self-pay

## 2019-06-01 ENCOUNTER — Other Ambulatory Visit: Payer: Medicare Other | Admitting: Orthotics

## 2019-06-18 ENCOUNTER — Other Ambulatory Visit: Payer: Self-pay | Admitting: Family Medicine

## 2019-06-18 NOTE — Telephone Encounter (Signed)
Tizanidine Last filled:  05/19/19, #30 Last OV:  04/21/19, low back pain Next OV:  none

## 2019-06-22 ENCOUNTER — Encounter: Payer: Self-pay | Admitting: Orthotics

## 2019-06-22 NOTE — Progress Notes (Signed)
Patients wife returned shoes and said they didn't work for him and wanted a refund.  I told her that wasn't possible as they were worn and smelled of cigarette smoke.   She was not happy and left them in our office and said "well, you have them now".   I called patient and advised that they could not be refunded since they were worn; he said he went to [employer] and got a pair that worked better for him.   I will hold for one week and put in garbage as they will not work to be exchanged.

## 2019-07-02 ENCOUNTER — Ambulatory Visit (AMBULATORY_SURGERY_CENTER): Payer: Self-pay | Admitting: *Deleted

## 2019-07-02 ENCOUNTER — Other Ambulatory Visit: Payer: Self-pay

## 2019-07-02 VITALS — Ht 64.0 in | Wt 121.0 lb

## 2019-07-02 DIAGNOSIS — Z85038 Personal history of other malignant neoplasm of large intestine: Secondary | ICD-10-CM

## 2019-07-02 MED ORDER — SUTAB 1479-225-188 MG PO TABS: 1.0000 | ORAL_TABLET | Freq: Once | ORAL | 0 refills | Status: AC

## 2019-07-02 NOTE — Progress Notes (Signed)

## 2019-07-03 ENCOUNTER — Encounter: Payer: Self-pay | Admitting: Gastroenterology

## 2019-07-16 ENCOUNTER — Ambulatory Visit (AMBULATORY_SURGERY_CENTER): Payer: Medicare Other | Admitting: Gastroenterology

## 2019-07-16 ENCOUNTER — Encounter: Payer: Self-pay | Admitting: Gastroenterology

## 2019-07-16 ENCOUNTER — Other Ambulatory Visit: Payer: Self-pay

## 2019-07-16 VITALS — BP 137/68 | HR 56 | Temp 97.5°F | Resp 18 | Ht 64.0 in | Wt 121.0 lb

## 2019-07-16 DIAGNOSIS — Z85038 Personal history of other malignant neoplasm of large intestine: Secondary | ICD-10-CM

## 2019-07-16 DIAGNOSIS — D122 Benign neoplasm of ascending colon: Secondary | ICD-10-CM | POA: Diagnosis not present

## 2019-07-16 DIAGNOSIS — D123 Benign neoplasm of transverse colon: Secondary | ICD-10-CM

## 2019-07-16 DIAGNOSIS — Z1211 Encounter for screening for malignant neoplasm of colon: Secondary | ICD-10-CM | POA: Diagnosis not present

## 2019-07-16 HISTORY — PX: COLONOSCOPY: SHX5424

## 2019-07-16 MED ORDER — SODIUM CHLORIDE 0.9 % IV SOLN: 500.0000 mL | Freq: Once | INTRAVENOUS | Status: DC

## 2019-07-16 NOTE — Progress Notes (Signed)
Pt. Reports no change in his medical or surgical history since his pre-visit 07/02/2019.

## 2019-07-16 NOTE — Patient Instructions (Signed)
Handout on polyps given to you today  Await pathology results   YOU HAD AN ENDOSCOPIC PROCEDURE TODAY AT THE [facility]:   Refer to the procedure report that was given to you for any specific questions about what was found during the examination.  If the procedure report does not answer your questions, please call your gastroenterologist to clarify.  If you requested that your care partner not be given the details of your procedure findings, then the procedure report has been included in a sealed envelope for you to review at your convenience later.  YOU SHOULD EXPECT: Some feelings of bloating in the abdomen. Passage of more gas than usual.  Walking can help get rid of the air that was put into your GI tract during the procedure and reduce the bloating. If you had a lower endoscopy (such as a colonoscopy or flexible sigmoidoscopy) you may notice spotting of blood in your stool or on the toilet paper. If you underwent a bowel prep for your procedure, you may not have a normal bowel movement for a few days.  Please Note:  You might notice some irritation and congestion in your nose or some drainage.  This is from the oxygen used during your procedure.  There is no need for concern and it should clear up in a day or so.  SYMPTOMS TO REPORT IMMEDIATELY:   Following lower endoscopy (colonoscopy or flexible sigmoidoscopy):  Excessive amounts of blood in the stool  Significant tenderness or worsening of abdominal pains  Swelling of the abdomen that is new, acute  Fever of 100F or higher  For urgent or emergent issues, a gastroenterologist can be reached at any hour by calling [phone]. Do not use MyChart messaging for urgent concerns.    DIET:  We do recommend a small meal at first, but then you may proceed to your regular diet.  Drink plenty of fluids but you should avoid alcoholic beverages for 24 hours.  ACTIVITY:  You should plan to take it easy for the rest of today and  you should NOT DRIVE or use heavy machinery until tomorrow (because of the sedation medicines used during the test).    FOLLOW UP: Our staff will call the number listed on your records 48-72 hours following your procedure to check on you and address any questions or concerns that you may have regarding the information given to you following your procedure. If we do not reach you, we will leave a message.  We will attempt to reach you two times.  During this call, we will ask if you have developed any symptoms of COVID 19. If you develop any symptoms (ie: fever, flu-like symptoms, shortness of breath, cough etc.) before then, please call [phone].  If you test positive for Covid 19 in the 2 weeks post procedure, please call and report this information to us.    If any biopsies were taken you will be contacted by phone or by letter within the next 1-3 weeks.  Please call us at [phone] if you have not heard about the biopsies in 3 weeks.    SIGNATURES/CONFIDENTIALITY: You and/or your care partner have signed paperwork which will be entered into your electronic medical record.  These signatures attest to the fact that that the information above on your After Visit Summary has been reviewed and is understood.  Full responsibility of the confidentiality of this discharge information lies with you and/or your care-partner. 

## 2019-07-16 NOTE — Op Note (Signed)
[name] Patient Name: Mario Mcdowell Procedure Date: 07/16/2019 8:36 AM MRN: [id number] Endoscopist: Ladene Artist , MD Age: 69 Referring MD:  Date of Birth: March 07, 1950 Gender: Male Account #: [id number] Procedure:                Colonoscopy Indications:              High risk colon cancer surveillance: Personal                            history of colon cancer Medicines:                Monitored Anesthesia Care Procedure:                Pre-Anesthesia Assessment:                           - Prior to the procedure, a History and Physical                            was performed, and patient medications and                            allergies were reviewed. The patient's tolerance of                            previous anesthesia was also reviewed. The risks                            and benefits of the procedure and the sedation                            options and risks were discussed with the patient.                            All questions were answered, and informed consent                            was obtained. Prior Anticoagulants: The patient has                            taken no previous anticoagulant or antiplatelet                            agents. ASA Grade Assessment: III - A patient with                            severe systemic disease. After reviewing the risks                            and benefits, the patient was deemed in                            satisfactory condition to undergo the procedure.  After obtaining informed consent, the colonoscope                            was passed under direct vision. Throughout the                            procedure, the patient's blood pressure, pulse, and                            oxygen saturations were monitored continuously. The                            Colonoscope was introduced through the anus and                            advanced to the the cecum, identified by                             appendiceal orifice and ileocecal valve. The                            ileocecal valve, appendiceal orifice, and rectum                            were photographed. The quality of the bowel                            preparation was adequate. The colonoscopy was                            performed without difficulty. The patient tolerated                            the procedure well. Scope In: 8:42:32 AM Scope Out: 8:59:36 AM Scope Withdrawal Time: 0 hours 14 minutes 8 seconds  Total Procedure Duration: 0 hours 17 minutes 4 seconds  Findings:                 The perianal and digital rectal examinations were                            normal.                           Three sessile polyps were found in the transverse                            colon (2) and ascending colon (1). The polyps were                            7 to 10 mm in size. These polyps were removed with                            a cold snare. Resection and retrieval were complete.  There was evidence of a prior end-to-end                            colo-colonic anastomosis in the proximal rectum.                            This was patent and was characterized by healthy                            appearing mucosa. The anastomosis was traversed.                           The exam was otherwise without abnormality on                            direct and retroflexion views. Complications:            No immediate complications. Estimated blood loss:                            None. Estimated Blood Loss:     Estimated blood loss: none. Impression:               - Three 7 to 10 mm polyps in the transverse colon                            and in the ascending colon, removed with a cold                            snare. Resected and retrieved.                           - Patent end-to-end colo-colonic anastomosis,                            characterized by healthy appearing  mucosa.                           - The examination was otherwise normal on direct                            and retroflexion views. Recommendation:           - Repeat colonoscopy in 2 years for surveillance.                           - Patient has a contact number available for                            emergencies. The signs and symptoms of potential                            delayed complications were discussed with the                            patient. Return to  normal activities tomorrow.                            Written discharge instructions were provided to the                            patient.                           - Resume previous diet.                           - Continue present medications.                           - Await pathology results. Ladene Artist, MD 07/16/2019 9:06:26 AM This report has been signed electronically.

## 2019-07-16 NOTE — Progress Notes (Signed)
Called to room to assist during endoscopic procedure.  Patient ID and intended procedure confirmed with present staff. Received instructions for my participation in the procedure from the performing physician.  

## 2019-07-21 ENCOUNTER — Telehealth: Payer: Self-pay | Admitting: *Deleted

## 2019-07-21 ENCOUNTER — Telehealth: Payer: Self-pay

## 2019-07-21 NOTE — Telephone Encounter (Signed)
  Follow up Call-  Call back number 07/16/2019 05/29/2017  Post procedure Call Back phone  # [phone]  Permission to leave phone message Yes Yes  Some recent data might be hidden     Patient questions:  Do you have a fever, pain , or abdominal swelling? No. Pain Score  0 *  Have you tolerated food without any problems? Yes.    Have you been able to return to your normal activities? Yes.    Do you have any questions about your discharge instructions: Diet   No. Medications  No. Follow up visit  No.  Do you have questions or concerns about your Care? No.  Actions: * If pain score is 4 or above: No action needed, pain <4.

## 2019-07-21 NOTE — Telephone Encounter (Signed)
Follow up call made, no opportunity to leave a message.

## 2019-07-27 ENCOUNTER — Encounter: Payer: Self-pay | Admitting: Gastroenterology

## 2019-08-18 ENCOUNTER — Other Ambulatory Visit: Payer: Self-pay | Admitting: Family Medicine

## 2019-08-18 NOTE — Telephone Encounter (Signed)
Tizanidine Last filled:  06/19/19, #30 Last OV:  04/21/19, acute low back pain Next OV:  none

## 2019-09-28 ENCOUNTER — Other Ambulatory Visit: Payer: Medicare Other

## 2019-10-07 ENCOUNTER — Ambulatory Visit (INDEPENDENT_AMBULATORY_CARE_PROVIDER_SITE_OTHER)
Admission: RE | Admit: 2019-10-07 | Discharge: 2019-10-07 | Disposition: A | Discharge status: Home / Self Care | Payer: Medicare Other | Source: Ambulatory Visit | Attending: Pulmonary Disease | Admitting: Pulmonary Disease

## 2019-10-07 ENCOUNTER — Other Ambulatory Visit: Payer: Self-pay

## 2019-10-07 DIAGNOSIS — J432 Centrilobular emphysema: Secondary | ICD-10-CM | POA: Diagnosis not present

## 2019-10-07 DIAGNOSIS — I700 Atherosclerosis of aorta: Secondary | ICD-10-CM | POA: Diagnosis not present

## 2019-10-07 DIAGNOSIS — J984 Other disorders of lung: Secondary | ICD-10-CM | POA: Diagnosis not present

## 2019-10-07 DIAGNOSIS — I2510 Atherosclerotic heart disease of native coronary artery without angina pectoris: Secondary | ICD-10-CM | POA: Diagnosis not present

## 2019-10-07 DIAGNOSIS — Z85038 Personal history of other malignant neoplasm of large intestine: Secondary | ICD-10-CM | POA: Diagnosis not present

## 2019-10-19 ENCOUNTER — Other Ambulatory Visit: Payer: Self-pay | Admitting: Family Medicine

## 2019-10-20 NOTE — Telephone Encounter (Signed)
Tizanidine Last filled:  09/07/19, #30 Last OV:  01/19/19, AWV prt 2 Next OV:  none

## 2019-10-30 ENCOUNTER — Ambulatory Visit: Payer: Medicare Other | Admitting: Pulmonary Disease

## 2019-10-30 ENCOUNTER — Other Ambulatory Visit: Payer: Self-pay

## 2019-10-30 ENCOUNTER — Encounter: Payer: Self-pay | Admitting: Pulmonary Disease

## 2019-10-30 VITALS — BP 128/60 | HR 71 | Temp 98.0°F | Ht 65.0 in | Wt 124.4 lb

## 2019-10-30 DIAGNOSIS — J432 Centrilobular emphysema: Secondary | ICD-10-CM | POA: Diagnosis not present

## 2019-10-30 DIAGNOSIS — J984 Other disorders of lung: Secondary | ICD-10-CM

## 2019-10-30 DIAGNOSIS — Z720 Tobacco use: Secondary | ICD-10-CM

## 2019-10-30 NOTE — Patient Instructions (Signed)
Granulomatous Lung , Sarocoid? Emphysema Potentially may be an atypical presentation of sarcoid. Consider possibly related to metal exposure.  --CT Chest without contrast in 6 months

## 2019-10-30 NOTE — Progress Notes (Signed)
Subjective:   PATIENT ID: Mario Mcdowell GENDER: male DOB: 1950/07/31, MRN: [id number]   HPI  Chief Complaint  Patient presents with  . 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 is a 69 year old male current smoker with stage IIIB colon cancer s/p hemicolectomy s/p FOLFOX in remission who presents for follow-up.  Synopsis: He underwent navigational bronchoscopy on 11/14/18. Results with no evidence of cancer. Focal non-caseating granuloma in the right upper lobe.  Since our last visit on 11/27/18, he denies any pulmonary issues. Denies shortness of breath, cough or wheezing. He reports excellent activity level. He recently took out his deck and put in a new deck without any issue. He does continue to smoke 1ppd. Does not express interest in quitting as it does not inhibit him from performing his regular activities. Not on any inhalers. Denies recent fevers, chills, cough, hemoptysis, unintentional weight loss or night sweats.  Social History: Active smoker. 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 travel, exposure to individuals with homelessness or jail/prison  I have personally reviewed patient's past medical/family/social history/allergies/current medications.  Past Medical History:  Diagnosis Date  . Aorto-iliac atherosclerosis (Mario Mcdowell)    by CT; in bilateral legs   . Arthritis   . Atherosclerotic Mario Mcdowell with intermittent claudication (Mario Mcdowell)   . BPH without obstruction/lower urinary tract symptoms 10/25/2015  . Centrilobular emphysema (Mario Mcdowell)    per chest CT 04-23-2016  . Colon adenocarcinoma Mario Mcdowell) oncologist- dr Mario Mcdowell   dx 05-21-2016 -- Stage IIIB (pT3,pN1,M0) , Invasive sigmoid adenocarcinoma well to moderately differentiated with metastatic intra-abdominal lymph node (1 out of 22), s/p lap. partial colectomy Mario Mcdowell) 05/21/2016 and chemotherapy is planned  . Full dentures   . History of kidney  stones 07/2015   passed without intervention  . Pulmonary nodules    per chest CT 04-23-2016 bilateral nodules (consistent w/ granulomatous disease)  . Smokers' cough (Mario Mcdowell)   . Wears contact lenses     Allergies  Allergen Reactions  . Penicillins Swelling    Did it involve swelling of the face/tongue/throat, SOB, or low BP? Yes Did it involve sudden or severe rash/hives, skin peeling, or any reaction on the inside of your mouth or nose? No Did you need to seek medical attention at a hospital or doctor's office? No When did it last happen?childhood allergy If all above answers are "NO", may proceed with cephalosporin use.    . Lipitor [Atorvastatin] Nausea Only     Outpatient Medications Prior to Visit  Medication Sig Dispense Refill  . aspirin EC 81 MG tablet Take 81 mg by mouth daily.    Marland Kitchen ibuprofen (ADVIL) 200 MG tablet Take 400 mg by mouth every 6 (six) hours as needed for moderate pain.     . rosuvastatin (CRESTOR) 5 MG tablet Take 1 tablet by mouth once daily 30 tablet 11  . tiZANidine (ZANAFLEX) 4 MG tablet TAKE 1 TABLET BY MOUTH TWICE DAILY AS NEEDED FOR MUSCLE SPASM 30 tablet 0   No facility-administered medications prior to visit.    Review of Systems  Constitutional: Negative for chills, diaphoresis, fever, malaise/fatigue and weight loss.  HENT: Negative for congestion.   Respiratory: Negative for cough, hemoptysis, sputum production, shortness of breath and wheezing.   Cardiovascular: Negative for chest pain, palpitations and leg swelling.    Objective:   Vitals:   10/30/19 1357  BP: 128/60  Pulse: 71  Temp: 98 F (36.7 C)  TempSrc: Oral  SpO2: 98%  Weight: 124 lb 6.4 oz (56.4 kg)  Height: 5\' 5"  (1.651 m)   SpO2: 98 % O2 Device: None (Room air)  Physical Exam: General: Well-appearing, no acute distress HENT: Hand, AT Eyes: EOMI, no scleral icterus Respiratory: Clear to auscultation bilaterally.  No crackles, wheezing or rales Cardiovascular:  RRR, -M/R/G, no JVD Extremities:-Edema,-tenderness Neuro: AAO x4, Mario Mcdowell grossly intact Skin: Intact, no rashes or bruising Psych: Normal mood, normal affect  Data Reviewed:  Imaging: CT Chest 10/17/18 - 5x4cm and 3x2cm lung masses, slightly improved compared to prior imaging. Background emphysema. CT Chest 02/21/19 - Improved but persistent RUL consolidation. Background emphysema unchanged CT Chest 10/07/19 - Similar posterior RUL cavitary lesions including 4x4cm. Anterior RUL 2.7 x 1cm lesion with increased cavitation.   PFT: None on file  Labs: CBC    Component Value Date/Time   WBC 9.0 11/14/2018 0552   RBC 4.82 11/14/2018 0552   HGB 15.3 11/14/2018 0552   HGB 14.9 12/03/2016 0748   HCT 45.2 11/14/2018 0552   HCT 44.3 12/03/2016 0748   PLT 202 11/14/2018 0552   PLT 178 12/03/2016 0748   MCV 93.8 11/14/2018 0552   MCV 97.5 12/03/2016 0748   MCH 31.7 11/14/2018 0552   MCHC 33.8 11/14/2018 0552   RDW 16.4 (H) 11/14/2018 0552   RDW 12.7 12/03/2016 0748   LYMPHSABS 2.7 11/04/2018 1011   LYMPHSABS 3.5 (H) 12/03/2016 0748   MONOABS 0.7 11/04/2018 1011   MONOABS 0.5 12/03/2016 0748   EOSABS 0.1 11/04/2018 1011   EOSABS 0.1 12/03/2016 0748   BASOSABS 0.1 11/04/2018 1011   BASOSABS 0.0 12/03/2016 0748   BMET    Component Value Date/Time   NA 141 11/14/2018 0552   NA 139 12/03/2016 0748   K 4.1 11/14/2018 0552   K 4.4 12/03/2016 0748   CL 103 11/14/2018 0552   CO2 25 11/14/2018 0552   CO2 26 12/03/2016 0748   GLUCOSE 96 11/14/2018 0552   GLUCOSE 108 12/03/2016 0748   BUN 14 11/14/2018 0552   BUN 14.4 12/03/2016 0748   CREATININE 0.87 11/14/2018 0552   CREATININE 0.81 06/12/2018 1320   CREATININE 0.8 12/03/2016 0748   CALCIUM 9.3 11/14/2018 0552   CALCIUM 9.5 12/03/2016 0748   GFRNONAA >60 11/14/2018 0552   GFRNONAA >60 06/12/2018 1320   GFRAA >60 11/14/2018 0552   GFRAA >60 06/12/2018 1320   Imaging, labs and test noted above have been reviewed independently  by me.    Assessment & Plan:   Discussion: 69 year old male active smoker with hx of adenocarcinoma of the sigmoid colon s/p hemicolectomy and FOLCFOX, now in remission. Initially referred to me for RUL cavitary masses concerning for primary lung malignancy. S/p navigational bronch on 11/14/18 which demonstrated focal non-caseating granuloma. No evidence of malignancy. Recent CT from 09/2019 reviewed with mildly increasing cavitation. Suspicion for malignancy is low at this point. Likely an inflammatory or smoldering infectious process. Could also represent sarcoid.  Granulomatous Lung , Sarcoid? Potentially may be an atypical presentation of sarcoid. We reviewed imaging together in-clinic. Discussed plan including the utility of repeat biopsy. After discussion, will continue surveillance imaging --CT Chest without contrast in 6 months  Emphysema --No indication for bronchodilators  Tobacco abuse Patient is an active smoker.  --Counseled on smoking cessation however patient is not interested in quitting   Health Maintenance Immunization History  Administered Date(s) Administered  . Fluad Quad(high Dose 65+) 10/30/2018  .  Influenza,inj,Quad PF,6+ Mos 12/05/2016, 12/19/2017  . PFIZER SARS-COV-2 Vaccination 03/16/2019, 04/06/2019  . Pneumococcal Conjugate-13 01/19/2019   Orders Placed This Encounter  Procedures  . CT Chest Wo Contrast    Standing Status:   Future    Standing Expiration Date:   04/29/2020    Scheduling Instructions:     Schedule in 6 months    Order Specific Question:   Preferred imaging location?    Answer:   [facility]   No orders of the defined types were placed in this encounter.  Return in about 6 months (around 04/29/2020).   I have spent a total time of 31-minutes on the day of the appointment reviewing prior documentation, coordinating care and discussing medical diagnosis and plan with the patient/family. Imaging, labs and tests included in this  note have been reviewed and interpreted independently by me.  Guffey, MD [facility] 10/30/2019 2:20 PM  Office Number [phone]

## 2019-11-01 ENCOUNTER — Encounter: Payer: Self-pay | Admitting: Pulmonary Disease

## 2019-11-01 DIAGNOSIS — J432 Centrilobular emphysema: Secondary | ICD-10-CM | POA: Insufficient documentation

## 2019-12-17 ENCOUNTER — Telehealth: Payer: Self-pay

## 2019-12-17 MED ORDER — ROSUVASTATIN CALCIUM 5 MG PO TABS: 5.0000 mg | ORAL_TABLET | Freq: Every day | ORAL | 0 refills | Status: DC

## 2019-12-17 NOTE — Telephone Encounter (Signed)
Received faxed 90-day rx request from [facility] for rosuvastatin 5 mg tab.  E-scribed refill.  Plz schedule wellness, lab and cpe visits.

## 2020-01-29 ENCOUNTER — Other Ambulatory Visit: Payer: Self-pay | Admitting: Family Medicine

## 2020-01-29 NOTE — Telephone Encounter (Signed)
Tizanidine Last filled:  10/20/19, #30 Last OV:  04/21/19, acute low back pain Next OV:  02/23/20, AWV prt 2

## 2020-02-01 ENCOUNTER — Other Ambulatory Visit: Payer: Self-pay | Admitting: Family Medicine

## 2020-02-02 NOTE — Telephone Encounter (Signed)
Last filled 10-20-19 #30 Last OV 04-21-19 Acute Next OV 02-23-20 [facility] [address]

## 2020-02-07 ENCOUNTER — Other Ambulatory Visit: Payer: Self-pay | Admitting: Family Medicine

## 2020-02-09 ENCOUNTER — Telehealth: Payer: Self-pay | Admitting: *Deleted

## 2020-02-09 NOTE — Telephone Encounter (Signed)
Patient's wife left a voicemail stating that his pharmacy has requested a refill on his Tizanidine and has not gotten an okay on it. Advised patient's wife the refill was sent in on 02/08/20.  Called and spoke with the pharmacist and was advised that they got the refill but it is too soon to fill it now. Was advised that the script can not be filled until 02/11/20. Called and advised patient's wife of this. Patient's wife stated that she is not sure what happened because he only has one pill left. Mrs. Vallone stated that she is on hold with the pharmacy. Advised Mrs. Afzal the script can be filled 02/11/20 and she verbalized understanding.

## 2020-02-14 ENCOUNTER — Other Ambulatory Visit: Payer: Self-pay | Admitting: Family Medicine

## 2020-02-14 DIAGNOSIS — R7303 Prediabetes: Secondary | ICD-10-CM

## 2020-02-14 DIAGNOSIS — C187 Malignant neoplasm of sigmoid colon: Secondary | ICD-10-CM

## 2020-02-14 DIAGNOSIS — N400 Enlarged prostate without lower urinary tract symptoms: Secondary | ICD-10-CM

## 2020-02-14 DIAGNOSIS — C772 Secondary and unspecified malignant neoplasm of intra-abdominal lymph nodes: Secondary | ICD-10-CM

## 2020-02-14 DIAGNOSIS — E7800 Pure hypercholesterolemia, unspecified: Secondary | ICD-10-CM

## 2020-02-16 ENCOUNTER — Other Ambulatory Visit: Payer: Self-pay

## 2020-02-16 ENCOUNTER — Other Ambulatory Visit (INDEPENDENT_AMBULATORY_CARE_PROVIDER_SITE_OTHER): Payer: Medicare Other

## 2020-02-16 DIAGNOSIS — N400 Enlarged prostate without lower urinary tract symptoms: Secondary | ICD-10-CM

## 2020-02-16 DIAGNOSIS — E7800 Pure hypercholesterolemia, unspecified: Secondary | ICD-10-CM

## 2020-02-16 DIAGNOSIS — C772 Secondary and unspecified malignant neoplasm of intra-abdominal lymph nodes: Secondary | ICD-10-CM

## 2020-02-16 DIAGNOSIS — C187 Malignant neoplasm of sigmoid colon: Secondary | ICD-10-CM

## 2020-02-16 DIAGNOSIS — R7303 Prediabetes: Secondary | ICD-10-CM | POA: Diagnosis not present

## 2020-02-16 LAB — HEMOGLOBIN A1C: Hgb A1c MFr Bld: 6.4 % (ref 4.6–6.5)

## 2020-02-16 LAB — CBC WITH DIFFERENTIAL/PLATELET
Basophils Absolute: 0.1 10*3/uL (ref 0.0–0.1)
Basophils Relative: 0.7 % (ref 0.0–3.0)
Eosinophils Absolute: 0.1 10*3/uL (ref 0.0–0.7)
Eosinophils Relative: 1.4 % (ref 0.0–5.0)
HCT: 46.1 % (ref 39.0–52.0)
Hemoglobin: 15.7 g/dL (ref 13.0–17.0)
Lymphocytes Relative: 43.4 % (ref 12.0–46.0)
Lymphs Abs: 3.2 10*3/uL (ref 0.7–4.0)
MCHC: 34.1 g/dL (ref 30.0–36.0)
MCV: 93.6 fl (ref 78.0–100.0)
Monocytes Absolute: 0.6 10*3/uL (ref 0.1–1.0)
Monocytes Relative: 7.9 % (ref 3.0–12.0)
Neutro Abs: 3.4 10*3/uL (ref 1.4–7.7)
Neutrophils Relative %: 46.6 % (ref 43.0–77.0)
Platelets: 200 10*3/uL (ref 150.0–400.0)
RBC: 4.92 Mil/uL (ref 4.22–5.81)
RDW: 14.1 % (ref 11.5–15.5)
WBC: 7.4 10*3/uL (ref 4.0–10.5)

## 2020-02-16 LAB — COMPREHENSIVE METABOLIC PANEL
ALT: 12 U/L (ref 0–53)
AST: 17 U/L (ref 0–37)
Albumin: 4.4 g/dL (ref 3.5–5.2)
Alkaline Phosphatase: 87 U/L (ref 39–117)
BUN: 16 mg/dL (ref 6–23)
CO2: 28 mEq/L (ref 19–32)
Calcium: 9.4 mg/dL (ref 8.4–10.5)
Chloride: 100 mEq/L (ref 96–112)
Creatinine, Ser: 0.88 mg/dL (ref 0.40–1.50)
GFR: 87.74 mL/min (ref >60.00)
Glucose, Bld: 121 mg/dL — ABNORMAL HIGH (ref 70–99)
Potassium: 4.1 mEq/L (ref 3.5–5.1)
Sodium: 136 mEq/L (ref 135–145)
Total Bilirubin: 0.4 mg/dL (ref 0.2–1.2)
Total Protein: 7.1 g/dL (ref 6.0–8.3)

## 2020-02-16 LAB — LIPID PANEL
Cholesterol: 142 mg/dL (ref 0–200)
HDL: 39.2 mg/dL (ref >39.00)
LDL Cholesterol: 89 mg/dL (ref 0–99)
NonHDL: 102.41
Total CHOL/HDL Ratio: 4
Triglycerides: 65 mg/dL (ref 0.0–149.0)
VLDL: 13 mg/dL (ref 0.0–40.0)

## 2020-02-16 LAB — PSA: PSA: 0.6 ng/mL (ref 0.10–4.00)

## 2020-02-17 ENCOUNTER — Ambulatory Visit (INDEPENDENT_AMBULATORY_CARE_PROVIDER_SITE_OTHER): Payer: Medicare Other

## 2020-02-17 DIAGNOSIS — Z0000 Encounter for general adult medical examination without abnormal findings: Secondary | ICD-10-CM

## 2020-02-17 NOTE — Progress Notes (Signed)
Subjective:   Mario Mcdowell is a 70 y.o. male who presents for Medicare Annual/Subsequent preventive examination.  Review of Systems: N/A      I connected with the patient today by telephone and verified that I am speaking with the correct person using two identifiers. Location patient: home Location nurse: work Persons participating in the telephone visit: patient, nurse.   I discussed the limitations, risks, security and privacy concerns of performing an evaluation and management service by telephone and the availability of in person appointments. I also discussed with the patient that there may be a patient responsible charge related to this service. The patient expressed understanding and verbally consented to this telephonic visit.        Cardiac Risk Factors include: advanced age (>60men, >26 women);male gender;Other (see comment), Risk factor comments: hyperlipidemia     Objective:    Today's Vitals   There is no height or weight on file to calculate BMI.  Advanced Directives 02/17/2020 03/04/2019 01/15/2019 11/14/2018 01/10/2018 11/27/2017 09/02/2017  Does Patient Have a Medical Advance Directive? No No No No No No No  Does patient want to make changes to medical advance directive? No - Patient declined - - - - - -  Would patient like information on creating a medical advance directive? - No - Patient declined No - Patient declined No - Patient declined No - Patient declined - -    Current Medications (verified) Outpatient Encounter Medications as of 02/17/2020  Medication Sig  . aspirin EC 81 MG tablet Take 81 mg by mouth daily.  Marland Kitchen ibuprofen (ADVIL) 200 MG tablet Take 400 mg by mouth every 6 (six) hours as needed for moderate pain.   . rosuvastatin (CRESTOR) 5 MG tablet Take 1 tablet (5 mg total) by mouth daily.  Marland Kitchen tiZANidine (ZANAFLEX) 4 MG tablet TAKE 1 TABLET BY MOUTH TWICE DAILY AS NEEDED FOR MUSCLE SPASM   No facility-administered encounter medications on file as of  02/17/2020.    Allergies (verified) Penicillins and Lipitor [atorvastatin]   History: Past Medical History:  Diagnosis Date  . Aorto-iliac atherosclerosis (HCC)    by CT; in bilateral legs   . Arthritis   . Atherosclerotic PVD with intermittent claudication (HCC)   . BPH without obstruction/lower urinary tract symptoms 10/25/2015  . Centrilobular emphysema (HCC)    per chest CT 04-23-2016  . 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 partial colectomy Maisie Fus) 05/21/2016 and chemotherapy is planned  . Full dentures   . History of kidney stones 07/2015   passed without intervention  . Pulmonary nodules    per chest CT 04-23-2016 bilateral nodules (consistent w/ granulomatous disease)  . Smokers' cough (HCC)   . Wears contact lenses    Past Surgical History:  Procedure Laterality Date  . COLONOSCOPY  03/12/2016   4 polyps, sigmoid tumor biopsied (TVA w/ MICROSCOPIC FOCUS SUSPICIOUS FOR INVASION) pending surgical resection, rpt 1 yr Russella Dar)  . COLONOSCOPY  05/2017   2 benign polyps, rpt 2 yrs Russella Dar)  . LAPAROSCOPIC PARTIAL COLECTOMY N/A 05/21/2016   colon cancer, 1/20 positive lymp nodes. LAPAROSCOPIC PARTIAL COLECTOMY, SPLENIC FLEXURE MOBILIZATION AND PROCTOSCOPY;  Romie Levee, MD)  . MULTIPLE TOOTH EXTRACTIONS  1980's  . PORT-A-CATH REMOVAL Right 10/04/2016   Procedure: REMOVAL PORT-A-CATH;  Surgeon: Romie Levee, MD;  Location: [facility];  Service: General;  Laterality: Right;  . PORTACATH PLACEMENT N/A 06/21/2016   Procedure: INSERTION PORT-A-CATH;  Surgeon: [name], MD;  Location: [facility];  Service: General;  Laterality: N/A;  right  . VIDEO BRONCHOSCOPY WITH ENDOBRONCHIAL NAVIGATION N/A 11/14/2018   VIDEO BRONCHOSCOPY WITH ENDOBRONCHIAL NAVIGATION for R  lung cavitary lesions;  Icard, Octavio Graves, DO   Family History  Problem Relation Age of Onset  . Cancer Paternal Aunt        unsure details  . Diabetes Cousin   . Cancer Paternal Uncle        unknow type cancer   . Prostate cancer Neg Hx   . Kidney cancer Neg Hx   . CAD Neg Hx   . Stroke Neg Hx   . Colon cancer Neg Hx   . Esophageal cancer Neg Hx   . Stomach cancer Neg Hx   . Rectal cancer Neg Hx    Social History   Socioeconomic History  . Marital status: Married    Spouse name: Not on file  . Number of children: Not on file  . Years of education: Not on file  . Highest education level: Not on file  Occupational History  . Not on file  Tobacco Use  . Smoking status: Current Every Day Smoker    Packs/day: 1.00    Years: 46.00    Pack years: 46.00    Types: Cigarettes  . Smokeless tobacco: Never Used  . Tobacco comment: 3/4 pack daily  Vaping Use  . Vaping Use: Never used  Substance and Sexual Activity  . Alcohol use: No    Comment: quit 2000  . Drug use: No  . Sexual activity: Not Currently    Birth control/protection: Abstinence  Other Topics Concern  . Not on file  Social History Narrative   Lives with wife, adopted daughter, adopted son   [id number]   Edu: 8th grade   Activity: no regular exercise   Diet: no water - causes nausea. Fruits/vegetables daily.   Social Determinants of Health   Financial Resource Strain: Low Risk   . Difficulty of Paying Living Expenses: Not hard at all  Food Insecurity: No Food Insecurity  . Worried About Charity fundraiser in the Last Year: Never true  . Ran Out of Food in the Last Year: Never true  Transportation Needs: No Transportation Needs  . Lack of Transportation (Medical): No  . Lack of Transportation (Non-Medical): No  Physical Activity: Inactive  . Days of Exercise per Week: 0 days  . Minutes of Exercise per Session: 0 min  Stress: No Stress Concern Present  . Feeling of Stress : Not at all   Social Connections: Not on file    Tobacco Counseling Ready to quit: Not Answered Counseling given: Not Answered Comment: 3/4 pack daily   Clinical Intake:  Pre-visit preparation completed: Yes  Pain : No/denies pain     Nutritional Risks: None Diabetes: No  How often do you need to have someone help you when you read instructions, pamphlets, or other written materials from your doctor or pharmacy?: 1 - Never What is the last grade level you completed in school?: 7th  Diabetic: no Nutrition Risk Assessment:  Has the patient had any N/V/D within the last 2 months?  No  Does the patient have any non-healing wounds?  No  Has the patient had any unintentional weight loss or weight gain?  No   Diabetes:  Is the patient diabetic?  No  If diabetic, was a CBG obtained today?  N/A Did the patient bring in their glucometer from home?  N/A How often do you monitor your CBG's? N/A.   Financial Strains and Diabetes Management:  Are you having any financial strains with the device, your supplies or your medication? N/A.  Does the patient want to be seen by Chronic Care Management for management of their diabetes?  N/A Would the patient like to be referred to a Nutritionist or for Diabetic Management?  N/A   Interpreter Needed?: No  Information entered by :: CJohnson, LPN   Activities of Daily Living In your present state of health, do you have any difficulty performing the following activities: 02/17/2020  Hearing? N  Vision? N  Difficulty concentrating or making decisions? N  Walking or climbing stairs? N  Dressing or bathing? N  Doing errands, shopping? N  Preparing Food and eating ? N  Using the Toilet? N  In the past six months, have you accidently leaked urine? N  Do you have problems with loss of bowel control? N  Managing your Medications? N  Managing your Finances? N  Housekeeping or managing your Housekeeping? N  Some recent data might be hidden     Patient Care Team: Ria Bush, MD as PCP - General (Family Medicine) Buford Dresser, MD as PCP - Cardiology (Cardiology) Ladene Artist, MD as Consulting Physician (Gastroenterology) [name], MD as Consulting Physician (General Surgery) Truitt Merle, MD as Consulting Physician (Hematology)  Indicate any recent Medical Services you may have received from other than [facility] providers in the past year (date may be approximate).     Assessment:   This is a routine wellness examination for Schuyler.  Hearing/Vision screen  Hearing Screening   125Hz  250Hz  500Hz  1000Hz  2000Hz  3000Hz  4000Hz  6000Hz  8000Hz   Right ear:           Left ear:           Vision Screening Comments: Patient gets annual eye exams   Dietary issues and exercise activities discussed: Current Exercise Habits: The patient does not participate in regular exercise at present, Exercise limited by: None identified  Goals    . Follow up with Primary Care Provider     Starting 01/09/2017, I will continue to take medications as prescribed and to keep appointments as scheduled with PCP and other specialists.     . Patient Stated     01/15/2019, I will maintain and continue medications as prescribed.     . Patient Stated     02/17/2020, I will maintain and continue medications as prescribed.     . Quit Smoking      Depression Screen PHQ 2/9 Scores 02/17/2020 01/15/2019 01/10/2018 01/09/2017 01/06/2016  PHQ - 2 Score 0 0 0 0 0  PHQ- 9 Score 0 0 0 0 -    Fall Risk Fall Risk  02/17/2020 01/19/2019 01/15/2019 01/10/2018 01/09/2017  Falls in the past year? 0 0 0 0 Yes  Number falls in past yr: 0 - 0 - 1  Injury with Fall? 0 - 0 - No  Risk for fall due to : No Fall Risks - Medication side effect - -  Follow up Falls evaluation completed;Falls prevention discussed - Falls evaluation completed;Falls prevention discussed - -    FALL RISK PREVENTION PERTAINING TO THE HOME:  Any stairs in or around the home? Yes   If so, are there any without handrails? No  Home free of loose throw  rugs in walkways, pet beds, electrical cords, etc? Yes  Adequate lighting in your home to reduce risk of falls? Yes   ASSISTIVE DEVICES UTILIZED TO PREVENT FALLS:  Life alert? No  Use of a cane, walker or w/c? No  Grab bars in the bathroom? No  Shower chair or bench in shower? No  Elevated toilet seat or a handicapped toilet? No   TIMED UP AND GO:  Was the test performed? N/A telephone visit .    Cognitive Function: MMSE - Mini Mental State Exam 02/17/2020 01/15/2019 01/10/2018 01/09/2017  Not completed: Refused - - -  Orientation to time - 5 5 5   Orientation to Place - 5 5 5   Registration - 3 3 3   Attention/ Calculation - 3 0 0  Recall - 3 2 3   Recall-comments - - unable to recall 1 of 3 words -  Language- name 2 objects - - 0 0  Language- repeat - 1 1 1   Language- follow 3 step command - - 3 3  Language- read & follow direction - - 0 0  Write a sentence - - 0 0  Copy design - - 0 0  Total score - - 19 20  Mini Cog  Mini-Cog screen was not completed. Patient has no memory issues and does not feel the need to do this. Maximum score is 22. A value of 0 denotes this part of the MMSE was not completed or the patient failed this part of the Mini-Cog screening.       Immunizations Immunization History  Administered Date(s) Administered  . Fluad Quad(high Dose 65+) 10/30/2018  . Influenza,inj,Quad PF,6+ Mos 12/05/2016, 12/19/2017  . PFIZER(Purple Top)SARS-COV-2 Vaccination 03/16/2019, 04/06/2019  . Pneumococcal Conjugate-13 01/19/2019    TDAP status: Due, Education has been provided regarding the importance of this vaccine. Advised may receive this vaccine at local pharmacy or Health Dept. Aware to provide a copy of the vaccination record if obtained from local pharmacy or Health Dept. Verbalized acceptance and understanding.  Flu Vaccine status: Due, Education has been provided regarding the importance  of this vaccine. Advised may receive this vaccine at local pharmacy or Health Dept. Aware to provide a copy of the vaccination record if obtained from local pharmacy or Health Dept. Verbalized acceptance and understanding.  Pneumococcal vaccine status: Due, Education has been provided regarding the importance of this vaccine. Advised may receive this vaccine at local pharmacy or Health Dept. Aware to provide a copy of the vaccination record if obtained from local pharmacy or Health Dept. Verbalized acceptance and understanding.  Covid-19 vaccine status: Completed vaccines per patient   Qualifies for Shingles Vaccine? Yes   Zostavax completed No   Shingrix Completed?: No.    Education has been provided regarding the importance of this vaccine. Patient has been advised to call insurance company to determine out of pocket expense if they have not yet received this vaccine. Advised may also receive vaccine at local pharmacy or Health Dept. Verbalized acceptance and understanding.  Screening Tests Health Maintenance  Topic Date Due  . COVID-19 Vaccine (3 - Pfizer risk 4-dose series) 05/04/2019  . INFLUENZA VACCINE  08/16/2019  . PNA vac Low Risk Adult (2 of 2 - PPSV23) 01/19/2020  . TETANUS/TDAP  02/17/2023 (Originally 08/16/1969)  . COLONOSCOPY (Pts 45-22yrs Insurance coverage will need to be confirmed)  07/15/2021  . Hepatitis C Screening  Completed    Health Maintenance  Health Maintenance Due  Topic Date Due  . COVID-19 Vaccine (3 -  Pfizer risk 4-dose series) 05/04/2019  . INFLUENZA VACCINE  08/16/2019  . PNA vac Low Risk Adult (2 of 2 - PPSV23) 01/19/2020    Colorectal cancer screening: Type of screening: Colonoscopy. Completed 07/16/2019. Repeat every 2 years  Lung Cancer Screening: (Low Dose CT Chest recommended if Age 6-80 years, 30 pack-year currently smoking OR have quit w/in 15 years.) does qualify.   Lung Cancer Screening Referral: CT chest completed on 10/07/2019  Additional  Screening:  Hepatitis C Screening: does qualify Completed 12/30/2015  Vision Screening: Recommended annual ophthalmology exams for early detection of glaucoma and other disorders of the eye. Is the patient up to date with their annual eye exam?  Yes  Who is the provider or what is the name of the office in which the patient attends annual eye exams? Dr. Einar Gip If pt is not established with a provider, would they like to be referred to a provider to establish care? No .   Dental Screening: Recommended annual dental exams for proper oral hygiene  Community Resource Referral / Chronic Care Management: CRR required this visit?  No   CCM required this visit?  No      Plan:     I have personally reviewed and noted the following in the patient's chart:   . Medical and social history . Use of alcohol, tobacco or illicit drugs  . Current medications and supplements . Functional ability and status . Nutritional status . Physical activity . Advanced directives . List of other physicians . Hospitalizations, surgeries, and ER visits in previous 12 months . Vitals . Screenings to include cognitive, depression, and falls . Referrals and appointments  In addition, I have reviewed and discussed with patient certain preventive protocols, quality metrics, and best practice recommendations. A written personalized care plan for preventive services as well as general preventive health recommendations were provided to patient.   Due to this being a telephonic visit, the after visit summary with patients personalized plan was offered to patient via office or my-chart. Patient preferred to pick up at office at next visit or via mychart.   Andrez Grime, LPN   09/17/8180

## 2020-02-17 NOTE — Patient Instructions (Signed)
Mario Mcdowell , Thank you for taking time to come for your Medicare Wellness Visit. I appreciate your ongoing commitment to your health goals. Please review the following plan we discussed and let me know if I can assist you in the future.   Screening recommendations/referrals: Colonoscopy: Up to date, completed 07/16/2019, due 07/2021 Recommended yearly ophthalmology/optometry visit for glaucoma screening and checkup Recommended yearly dental visit for hygiene and checkup  Vaccinations: Influenza vaccine: due, will get at  Physical  Pneumococcal vaccine: due, will get at physical  Tdap vaccine: decline-insurance  Shingles vaccine: due, check with your insurance regarding coverage if interested    Covid-19: Completed series  Advanced directives: Advance directive discussed with you today. Even though you declined this today please call our office should you change your mind and we can give you the proper paperwork for you to fill out.   Conditions/risks identified: hyperlipidemia   Next appointment: Follow up in one year for your annual wellness visit.   Preventive Care 70 Years and Older, Male Preventive care refers to lifestyle choices and visits with your health care provider that can promote health and wellness. What does preventive care include?  A yearly physical exam. This is also called an annual well check.  Dental exams once or twice a year.  Routine eye exams. Ask your health care provider how often you should have your eyes checked.  Personal lifestyle choices, including:  Daily care of your teeth and gums.  Regular physical activity.  Eating a healthy diet.  Avoiding tobacco and drug use.  Limiting alcohol use.  Practicing safe sex.  Taking low doses of aspirin every day.  Taking vitamin and mineral supplements as recommended by your health care provider. What happens during an annual well check? The services and screenings done by your health care provider  during your annual well check will depend on your age, overall health, lifestyle risk factors, and family history of disease. Counseling  Your health care provider may ask you questions about your:  Alcohol use.  Tobacco use.  Drug use.  Emotional well-being.  Home and relationship well-being.  Sexual activity.  Eating habits.  History of falls.  Memory and ability to understand (cognition).  Work and work Statistician. Screening  You may have the following tests or measurements:  Height, weight, and BMI.  Blood pressure.  Lipid and cholesterol levels. These may be checked every 5 years, or more frequently if you are over 70 years old.  Skin check.  Lung cancer screening. You may have this screening every year starting at age 70 if you have a 30-pack-year history of smoking and currently smoke or have quit within the past 15 years.  Fecal occult blood test (FOBT) of the stool. You may have this test every year starting at age 70.  Flexible sigmoidoscopy or colonoscopy. You may have a sigmoidoscopy every 5 years or a colonoscopy every 10 years starting at age 70.  Prostate cancer screening. Recommendations will vary depending on your family history and other risks.  Hepatitis C blood test.  Hepatitis B blood test.  Sexually transmitted disease (STD) testing.  Diabetes screening. This is done by checking your blood sugar (glucose) after you have not eaten for a while (fasting). You may have this done every 1-3 years.  Abdominal aortic aneurysm (AAA) screening. You may need this if you are a current or former smoker.  Osteoporosis. You may be screened starting at age 70 if you are at high risk. Talk  with your health care provider about your test results, treatment options, and if necessary, the need for more tests. Vaccines  Your health care provider may recommend certain vaccines, such as:  Influenza vaccine. This is recommended every year.  Tetanus,  diphtheria, and acellular pertussis (Tdap, Td) vaccine. You may need a Td booster every 10 years.  Zoster vaccine. You may need this after age 70.  Pneumococcal 13-valent conjugate (PCV13) vaccine. One dose is recommended after age 70.  Pneumococcal polysaccharide (PPSV23) vaccine. One dose is recommended after age 70. Talk to your health care provider about which screenings and vaccines you need and how often you need them. This information is not intended to replace advice given to you by your health care provider. Make sure you discuss any questions you have with your health care provider. Document Released: 01/28/2015 Document Revised: 09/21/2015 Document Reviewed: 11/02/2014 Elsevier Interactive Patient Education  2017 [place] Prevention in the Home Falls can cause injuries. They can happen to people of all ages. There are many things you can do to make your home safe and to help prevent falls. What can I do on the outside of my home?  Regularly fix the edges of walkways and driveways and fix any cracks.  Remove anything that might make you trip as you walk through a door, such as a raised step or threshold.  Trim any bushes or trees on the path to your home.  Use bright outdoor lighting.  Clear any walking paths of anything that might make someone trip, such as rocks or tools.  Regularly check to see if handrails are loose or broken. Make sure that both sides of any steps have handrails.  Any raised decks and porches should have guardrails on the edges.  Have any leaves, snow, or ice cleared regularly.  Use sand or salt on walking paths during winter.  Clean up any spills in your garage right away. This includes oil or grease spills. What can I do in the bathroom?  Use night lights.  Install grab bars by the toilet and in the tub and shower. Do not use towel bars as grab bars.  Use non-skid mats or decals in the tub or shower.  If you need to sit down in  the shower, use a plastic, non-slip stool.  Keep the floor dry. Clean up any water that spills on the floor as soon as it happens.  Remove soap buildup in the tub or shower regularly.  Attach bath mats securely with double-sided non-slip rug tape.  Do not have throw rugs and other things on the floor that can make you trip. What can I do in the bedroom?  Use night lights.  Make sure that you have a light by your bed that is easy to reach.  Do not use any sheets or blankets that are too big for your bed. They should not hang down onto the floor.  Have a firm chair that has side arms. You can use this for support while you get dressed.  Do not have throw rugs and other things on the floor that can make you trip. What can I do in the kitchen?  Clean up any spills right away.  Avoid walking on wet floors.  Keep items that you use a lot in easy-to-reach places.  If you need to reach something above you, use a strong step stool that has a grab bar.  Keep electrical cords out of the way.  Do  not use floor polish or wax that makes floors slippery. If you must use wax, use non-skid floor wax.  Do not have throw rugs and other things on the floor that can make you trip. What can I do with my stairs?  Do not leave any items on the stairs.  Make sure that there are handrails on both sides of the stairs and use them. Fix handrails that are broken or loose. Make sure that handrails are as long as the stairways.  Check any carpeting to make sure that it is firmly attached to the stairs. Fix any carpet that is loose or worn.  Avoid having throw rugs at the top or bottom of the stairs. If you do have throw rugs, attach them to the floor with carpet tape.  Make sure that you have a light switch at the top of the stairs and the bottom of the stairs. If you do not have them, ask someone to add them for you. What else can I do to help prevent falls?  Wear shoes that:  Do not have high  heels.  Have rubber bottoms.  Are comfortable and fit you well.  Are closed at the toe. Do not wear sandals.  If you use a stepladder:  Make sure that it is fully opened. Do not climb a closed stepladder.  Make sure that both sides of the stepladder are locked into place.  Ask someone to hold it for you, if possible.  Clearly mark and make sure that you can see:  Any grab bars or handrails.  First and last steps.  Where the edge of each step is.  Use tools that help you move around (mobility aids) if they are needed. These include:  Canes.  Walkers.  Scooters.  Crutches.  Turn on the lights when you go into a dark area. Replace any light bulbs as soon as they burn out.  Set up your furniture so you have a clear path. Avoid moving your furniture around.  If any of your floors are uneven, fix them.  If there are any pets around you, be aware of where they are.  Review your medicines with your doctor. Some medicines can make you feel dizzy. This can increase your chance of falling. Ask your doctor what other things that you can do to help prevent falls. This information is not intended to replace advice given to you by your health care provider. Make sure you discuss any questions you have with your health care provider. Document Released: 10/28/2008 Document Revised: 06/09/2015 Document Reviewed: 02/05/2014 Elsevier Interactive Patient Education  2017 [employer].

## 2020-02-17 NOTE — Progress Notes (Signed)
PCP notes:  Health Maintenance: Pneumococcal 23- due Flu- due Tdap- insurance COVID booster- will bring card to physical so we can document date in chart   Abnormal Screenings: none   Patient concerns: Mole on his face    Nurse concerns: none   Next PCP appt.: 02/23/2020 @ 2:30 pm

## 2020-02-23 ENCOUNTER — Encounter: Payer: Self-pay | Admitting: Family Medicine

## 2020-02-23 ENCOUNTER — Other Ambulatory Visit: Payer: Self-pay

## 2020-02-23 ENCOUNTER — Ambulatory Visit (INDEPENDENT_AMBULATORY_CARE_PROVIDER_SITE_OTHER): Payer: Medicare Other | Admitting: Family Medicine

## 2020-02-23 ENCOUNTER — Other Ambulatory Visit: Payer: Self-pay | Admitting: Hematology

## 2020-02-23 VITALS — BP 120/76 | HR 66 | Temp 98.0°F | Ht 63.25 in | Wt 118.4 lb

## 2020-02-23 DIAGNOSIS — L989 Disorder of the skin and subcutaneous tissue, unspecified: Secondary | ICD-10-CM | POA: Diagnosis not present

## 2020-02-23 DIAGNOSIS — E7800 Pure hypercholesterolemia, unspecified: Secondary | ICD-10-CM

## 2020-02-23 DIAGNOSIS — Z7189 Other specified counseling: Secondary | ICD-10-CM | POA: Diagnosis not present

## 2020-02-23 DIAGNOSIS — I700 Atherosclerosis of aorta: Secondary | ICD-10-CM | POA: Diagnosis not present

## 2020-02-23 DIAGNOSIS — Z23 Encounter for immunization: Secondary | ICD-10-CM

## 2020-02-23 DIAGNOSIS — Z0001 Encounter for general adult medical examination with abnormal findings: Secondary | ICD-10-CM | POA: Diagnosis not present

## 2020-02-23 DIAGNOSIS — C772 Secondary and unspecified malignant neoplasm of intra-abdominal lymph nodes: Secondary | ICD-10-CM

## 2020-02-23 DIAGNOSIS — R7303 Prediabetes: Secondary | ICD-10-CM

## 2020-02-23 DIAGNOSIS — Z720 Tobacco use: Secondary | ICD-10-CM

## 2020-02-23 DIAGNOSIS — J984 Other disorders of lung: Secondary | ICD-10-CM

## 2020-02-23 DIAGNOSIS — C187 Malignant neoplasm of sigmoid colon: Secondary | ICD-10-CM

## 2020-02-23 DIAGNOSIS — J432 Centrilobular emphysema: Secondary | ICD-10-CM

## 2020-02-23 DIAGNOSIS — I708 Atherosclerosis of other arteries: Secondary | ICD-10-CM

## 2020-02-23 MED ORDER — ROSUVASTATIN CALCIUM 5 MG PO TABS: 5.0000 mg | ORAL_TABLET | Freq: Every day | ORAL | 3 refills | Status: DC

## 2020-02-23 NOTE — Assessment & Plan Note (Signed)
Advanced directive discussion - wife would be HCPOA. Packetat home.Would not want prolonged life support. Would be ok with trial for reversible condition. Encouraged he complete packet.

## 2020-02-23 NOTE — Assessment & Plan Note (Signed)
Continue to encourage limiting sugar in diet.

## 2020-02-23 NOTE — Assessment & Plan Note (Signed)
Preventative protocols reviewed and updated unless pt declined. Discussed healthy diet and lifestyle.  

## 2020-02-23 NOTE — Assessment & Plan Note (Signed)
Chronic, stable. Continue rosuvastatin.  The 10-year ASCVD risk score Mikey Bussing DC Brooke Bonito., et al., 2013) is: 17.7%   Values used to calculate the score:     Age: 70 years     Sex: Male     Is Non-Hispanic African American: No     Diabetic: No     Tobacco smoker: Yes     Systolic Blood Pressure: 373 mmHg     Is BP treated: No     HDL Cholesterol: 39.2 mg/dL     Total Cholesterol: 142 mg/dL

## 2020-02-23 NOTE — Patient Instructions (Addendum)
Flu shot today  Pneumovax today  We will refer you back to Dr Burr Medico for cancer follow up.  We will refer you to skin doctor to check spot on face If interested, check with pharmacy about new 2 shot shigles series (shingrix).  Work on Scientist, physiological.  Return as needed or in 1 year for next physical.   Health Maintenance After Age 70 After age 19, you are at a higher risk for certain long-term diseases and infections as well as injuries from falls. Falls are a major cause of broken bones and head injuries in people who are older than age 85. Getting regular preventive care can help to keep you healthy and well. Preventive care includes getting regular testing and making lifestyle changes as recommended by your health care provider. Talk with your health care provider about:  Which screenings and tests you should have. A screening is a test that checks for a disease when you have no symptoms.  A diet and exercise plan that is right for you. What should I know about screenings and tests to prevent falls? Screening and testing are the best ways to find a health problem early. Early diagnosis and treatment give you the best chance of managing medical conditions that are common after age 67. Certain conditions and lifestyle choices may make you more likely to have a fall. Your health care provider may recommend:  Regular vision checks. Poor vision and conditions such as cataracts can make you more likely to have a fall. If you wear glasses, make sure to get your prescription updated if your vision changes.  Medicine review. Work with your health care provider to regularly review all of the medicines you are taking, including over-the-counter medicines. Ask your health care provider about any side effects that may make you more likely to have a fall. Tell your health care provider if any medicines that you take make you feel dizzy or sleepy.  Osteoporosis screening. Osteoporosis is a condition that  causes the bones to get weaker. This can make the bones weak and cause them to break more easily.  Blood pressure screening. Blood pressure changes and medicines to control blood pressure can make you feel dizzy.  Strength and balance checks. Your health care provider may recommend certain tests to check your strength and balance while standing, walking, or changing positions.  Foot health exam. Foot pain and numbness, as well as not wearing proper footwear, can make you more likely to have a fall.  Depression screening. You may be more likely to have a fall if you have a fear of falling, feel emotionally low, or feel unable to do activities that you used to do.  Alcohol use screening. Using too much alcohol can affect your balance and may make you more likely to have a fall. What actions can I take to lower my risk of falls? General instructions  Talk with your health care provider about your risks for falling. Tell your health care provider if: ? You fall. Be sure to tell your health care provider about all falls, even ones that seem minor. ? You feel dizzy, sleepy, or off-balance.  Take over-the-counter and prescription medicines only as told by your health care provider. These include any supplements.  Eat a healthy diet and maintain a healthy weight. A healthy diet includes low-fat dairy products, low-fat (lean) meats, and fiber from whole grains, beans, and lots of fruits and vegetables. Home safety  Remove any tripping hazards, such as rugs,  cords, and clutter.  Install safety equipment such as grab bars in bathrooms and safety rails on stairs.  Keep rooms and walkways well-lit. Activity  Follow a regular exercise program to stay fit. This will help you maintain your balance. Ask your health care provider what types of exercise are appropriate for you.  If you need a cane or walker, use it as recommended by your health care provider.  Wear supportive shoes that have nonskid  soles.   Lifestyle  Do not drink alcohol if your health care provider tells you not to drink.  If you drink alcohol, limit how much you have: ? 0-1 drink a day for women. ? 0-2 drinks a day for men.  Be aware of how much alcohol is in your drink. In the [place] one drink equals one typical bottle of beer (12 oz), one-half glass of wine (5 oz), or one shot of hard liquor (1 oz).  Do not use any products that contain nicotine or tobacco, such as cigarettes and e-cigarettes. If you need help quitting, ask your health care provider. Summary  Having a healthy lifestyle and getting preventive care can help to protect your health and wellness after age 79.  Screening and testing are the best way to find a health problem early and help you avoid having a fall. Early diagnosis and treatment give you the best chance for managing medical conditions that are more common for people who are older than age 8.  Falls are a major cause of broken bones and head injuries in people who are older than age 74. Take precautions to prevent a fall at home.  Work with your health care provider to learn what changes you can make to improve your health and wellness and to prevent falls. This information is not intended to replace advice given to you by your health care provider. Make sure you discuss any questions you have with your health care provider. Document Revised: 04/24/2018 Document Reviewed: 11/14/2016 Elsevier Patient Education  2021 [employer].

## 2020-02-23 NOTE — Assessment & Plan Note (Signed)
Overdue for onc f/u - will re refer.

## 2020-02-23 NOTE — Assessment & Plan Note (Addendum)
3/4 ppd. Encouraged full cessation.

## 2020-02-23 NOTE — Assessment & Plan Note (Addendum)
?  SCC vs BCC - refer to derm

## 2020-02-23 NOTE — Assessment & Plan Note (Signed)
?  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

## 2020-02-23 NOTE — Addendum Note (Signed)
Addended by: Brenton Grills on: 1/0/3128 11:88 PM   Modules accepted: Orders

## 2020-02-23 NOTE — Assessment & Plan Note (Signed)
Stable period off respiratory medication.  Sees pulmonology. Appreciate their care.

## 2020-02-23 NOTE — Assessment & Plan Note (Signed)
Continue statin, aspirin and VVS f/u.

## 2020-02-23 NOTE — Progress Notes (Signed)
Patient ID: Mario Mcdowell, male    DOB: 1950/05/13, 70 y.o.   MRN: [id number]  This visit was conducted in person.  BP 120/76 (BP Location: Left Arm, Patient Position: Sitting, Cuff Size: Normal)   Pulse 66   Temp 98 F (36.7 C) (Temporal)   Ht 5' 3.25" (1.607 m)   Wt 118 lb 6 oz (53.7 kg)   SpO2 96%   BMI 20.80 kg/m    CC: CPE Subjective:   HPI: Mario Mcdowell is a 70 y.o. male presenting on 02/23/2020 for Annual Exam (Prt 2. )   Saw health advisor last week for medicare wellness visit. Note reviewed.   No exam data present  Flowsheet Row Clinical Support from 02/17/2020 in [facility] [place] [facility] [place]  PHQ-2 Total Score 0      Fall Risk  02/17/2020 01/19/2019 01/15/2019 01/10/2018 01/09/2017  Falls in the past year? 0 0 0 0 Yes  Number falls in past yr: 0 - 0 - 1  Injury with Fall? 0 - 0 - No  Risk for fall due to : No Fall Risks - Medication side effect - -  Follow up Falls evaluation completed;Falls prevention discussed - Falls evaluation completed;Falls prevention discussed - -     Known stage IIIB colon cancer s/p hemicolectomy with FOLFOX in remission. 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 Planned surveillance with Q6 mo CT.  Last saw onc Burr Medico 10/2018.   Known PAD with claudication decided to postpone revascularization. Followed by Dr Scot Dock.   Preventative: Colon cancer - s/p laparoscopichemicolectomy then adjuvantFOLFOXchemo 05/2016 for stage IIIB colon cancer metastatic to intra-abd LN, followed by onc Burr Medico) close monitoring.  Colonoscopy 07/2019 - rpt 2 yrs [facility] Plan) Prostate cancer screening -PSA reassuring, declines DRE.h/o BPH. Flu shot -yearly [place] 03/2019 x2, 12/2019 Prevnar 13 01/2019, pneumovax today Tetanus shot -declines  Shingles shot -discussed.  Advanced directive discussion - wife would be HCPOA. Packetat  home.Would not want prolonged life support. Would be ok with trial for reversible condition. Encouraged he complete packet. Seat belt use discussed. Sunscreen use discussed, no changing moles on skin.but notes spot to scalp in front of left ear  Smoking - 3/4 ppd  Alcohol - none (h/o remote use) Dentist - has dentures  Eye exam yearly  Lives with wife, adopted daughter, adopted son QIH:[id number] Edu: 8th grade Activity: no regular exercise, but feels has active lifestyle Diet: no water - causes nausea.Drinks 1 gallon milk/day.Fruits/vegetables daily.     Relevant past medical, surgical, family and social history reviewed and updated as indicated. Interim medical history since our last visit reviewed. Allergies and medications reviewed and updated. Outpatient Medications Prior to Visit  Medication Sig Dispense Refill  . aspirin EC 81 MG tablet Take 81 mg by mouth daily.    Marland Kitchen ibuprofen (ADVIL) 200 MG tablet Take 400 mg by mouth every 6 (six) hours as needed for moderate pain.     Marland Kitchen tiZANidine (ZANAFLEX) 4 MG tablet TAKE 1 TABLET BY MOUTH TWICE DAILY AS NEEDED FOR MUSCLE SPASM 30 tablet 0  . rosuvastatin (CRESTOR) 5 MG tablet Take 1 tablet (5 mg total) by mouth daily. 90 tablet 0   No facility-administered medications prior to visit.     Per HPI unless specifically indicated in ROS section below Review of Systems  Constitutional: Negative for activity change, appetite change, chills, fatigue, fever and unexpected weight  change.  HENT: Negative for hearing loss.   Eyes: Negative for visual disturbance.  Respiratory: Negative for cough, chest tightness, shortness of breath and wheezing.   Cardiovascular: Negative for chest pain, palpitations and leg swelling.  Gastrointestinal: Negative for abdominal distention, abdominal pain, blood in stool, constipation, diarrhea, nausea and vomiting.  Genitourinary: Negative for difficulty urinating and hematuria.   Musculoskeletal: Negative for arthralgias, myalgias and neck pain.  Skin: Negative for rash.  Neurological: Negative for dizziness, seizures, syncope and headaches.  Hematological: Negative for adenopathy. Does not bruise/bleed easily.  Psychiatric/Behavioral: Negative for dysphoric mood. The patient is not nervous/anxious.    Objective:  BP 120/76 (BP Location: Left Arm, Patient Position: Sitting, Cuff Size: Normal)   Pulse 66   Temp 98 F (36.7 C) (Temporal)   Ht 5' 3.25" (1.607 m)   Wt 118 lb 6 oz (53.7 kg)   SpO2 96%   BMI 20.80 kg/m   Wt Readings from Last 3 Encounters:  02/23/20 118 lb 6 oz (53.7 kg)  10/30/19 124 lb 6.4 oz (56.4 kg)  07/16/19 121 lb (54.9 kg)      Physical Exam Vitals and nursing note reviewed.  Constitutional:      General: He is not in acute distress.    Appearance: Normal appearance. He is well-developed and well-nourished. He is not ill-appearing.  HENT:     Head: Normocephalic and atraumatic.     Right Ear: Hearing, tympanic membrane, ear canal and external ear normal.     Left Ear: Hearing, tympanic membrane, ear canal and external ear normal.     Mouth/Throat:     Mouth: Oropharynx is clear and moist and mucous membranes are normal.     Pharynx: Uvula midline. No posterior oropharyngeal edema.  Eyes:     General: No scleral icterus.    Extraocular Movements: Extraocular movements intact and EOM normal.     Conjunctiva/sclera: Conjunctivae normal.     Pupils: Pupils are equal, round, and reactive to light.  Cardiovascular:     Rate and Rhythm: Normal rate and regular rhythm.     Pulses: Normal pulses and intact distal pulses.          Radial pulses are 2+ on the right side and 2+ on the left side.     Heart sounds: Normal heart sounds. No murmur heard.   Pulmonary:     Effort: Pulmonary effort is normal. No respiratory distress.     Breath sounds: Normal breath sounds. No wheezing, rhonchi or rales.  Abdominal:     General: Abdomen is  flat. Bowel sounds are normal. There is no distension.     Palpations: Abdomen is soft. There is no mass.     Tenderness: There is no abdominal tenderness. There is no guarding or rebound.     Hernia: No hernia is present.  Musculoskeletal:        General: No edema. Normal range of motion.     Cervical back: Normal range of motion and neck supple.     Right lower leg: No edema.     Left lower leg: No edema.  Lymphadenopathy:     Cervical: No cervical adenopathy.  Skin:    General: Skin is warm and dry.     Findings: No rash.     Comments: Left face anterior to ear with scaly lesion ~5-74mm diameter   Neurological:     General: No focal deficit present.     Mental Status: He is alert and oriented  to person, place, and time.     Comments: CN grossly intact, station and gait intact  Psychiatric:        Mood and Affect: Mood and affect and mood normal.        Behavior: Behavior normal.        Thought Content: Thought content normal.        Judgment: Judgment normal.       Results for orders placed or performed in visit on 02/16/20  CBC with Differential/Platelet  Result Value Ref Range   WBC 7.4 4.0 - 10.5 K/uL   RBC 4.92 4.22 - 5.81 Mil/uL   Hemoglobin 15.7 13.0 - 17.0 g/dL   HCT 46.1 39.0 - 52.0 %   MCV 93.6 78.0 - 100.0 fl   MCHC 34.1 30.0 - 36.0 g/dL   RDW 14.1 11.5 - 15.5 %   Platelets 200.0 150.0 - 400.0 K/uL   Neutrophils Relative % 46.6 43.0 - 77.0 %   Lymphocytes Relative 43.4 12.0 - 46.0 %   Monocytes Relative 7.9 3.0 - 12.0 %   Eosinophils Relative 1.4 0.0 - 5.0 %   Basophils Relative 0.7 0.0 - 3.0 %   Neutro Abs 3.4 1.4 - 7.7 K/uL   Lymphs Abs 3.2 0.7 - 4.0 K/uL   Monocytes Absolute 0.6 0.1 - 1.0 K/uL   Eosinophils Absolute 0.1 0.0 - 0.7 K/uL   Basophils Absolute 0.1 0.0 - 0.1 K/uL  PSA  Result Value Ref Range   PSA 0.60 0.10 - 4.00 ng/mL  Hemoglobin A1c  Result Value Ref Range   Hgb A1c MFr Bld 6.4 4.6 - 6.5 %  Comprehensive metabolic panel  Result Value  Ref Range   Sodium 136 135 - 145 mEq/L   Potassium 4.1 3.5 - 5.1 mEq/L   Chloride 100 96 - 112 mEq/L   CO2 28 19 - 32 mEq/L   Glucose, Bld 121 (H) 70 - 99 mg/dL   BUN 16 6 - 23 mg/dL   Creatinine, Ser 0.88 0.40 - 1.50 mg/dL   Total Bilirubin 0.4 0.2 - 1.2 mg/dL   Alkaline Phosphatase 87 39 - 117 U/L   AST 17 0 - 37 U/L   ALT 12 0 - 53 U/L   Total Protein 7.1 6.0 - 8.3 g/dL   Albumin 4.4 3.5 - 5.2 g/dL   GFR 87.74 >60.00 mL/min   Calcium 9.4 8.4 - 10.5 mg/dL  Lipid panel  Result Value Ref Range   Cholesterol 142 0 - 200 mg/dL   Triglycerides 65.0 0.0 - 149.0 mg/dL   HDL 39.20 >39.00 mg/dL   VLDL 13.0 0.0 - 40.0 mg/dL   LDL Cholesterol 89 0 - 99 mg/dL   Total CHOL/HDL Ratio 4    NonHDL 102.41    Assessment & Plan:  This visit occurred during the SARS-CoV-2 public health emergency.  Safety protocols were in place, including screening questions prior to the visit, additional usage of staff PPE, and extensive cleaning of exam room while observing appropriate contact time as indicated for disinfecting solutions.   Problem List Items Addressed This Visit    Tobacco abuse    3/4 ppd. Encouraged full cessation.       Skin lesion of face    ?SCC vs BCC - refer to derm       Relevant Orders   Ambulatory referral to Dermatology   Prediabetes    Continue to encourage limiting sugar in diet.       HLD (hyperlipidemia)  Chronic, stable. Continue rosuvastatin.  The 10-year ASCVD risk score Mikey Bussing DC Brooke Bonito., et al., 2013) is: 17.7%   Values used to calculate the score:     Age: 49 years     Sex: Male     Is Non-Hispanic African American: No     Diabetic: No     Tobacco smoker: Yes     Systolic Blood Pressure: 884 mmHg     Is BP treated: No     HDL Cholesterol: 39.2 mg/dL     Total Cholesterol: 142 mg/dL       Relevant Medications   rosuvastatin (CRESTOR) 5 MG tablet   Encounter for general adult medical examination with abnormal findings - Primary    Preventative protocols  reviewed and updated unless pt declined. Discussed healthy diet and lifestyle.       Centrilobular emphysema (HCC)    Stable period off respiratory medication.  Sees pulmonology. Appreciate their care.       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      Relevant Orders   Ambulatory referral to Oncology   Aorto-iliac atherosclerosis ([place])    Continue statin, aspirin and VVS f/u.       Relevant Medications   rosuvastatin (CRESTOR) 5 MG tablet   Advanced care planning/counseling discussion    Advanced directive discussion - wife would be HCPOA. Packetat home.Would not want prolonged life support. Would be ok with trial for reversible condition. Encouraged he complete packet.        Other Visit Diagnoses    Need for influenza vaccination       Relevant Orders   Flu Vaccine QUAD High Dose(Fluad) (Completed)       Meds ordered this encounter  Medications  . rosuvastatin (CRESTOR) 5 MG tablet    Sig: Take 1 tablet (5 mg total) by mouth daily.    Dispense:  90 tablet    Refill:  3   Orders Placed This Encounter  Procedures  . Flu Vaccine QUAD High Dose(Fluad)  . Ambulatory referral to Dermatology    Referral Priority:   Routine    Referral Type:   Consultation    Referral Reason:   Specialty Services Required    Requested Specialty:   Dermatology    Number of Visits Requested:   1  . Ambulatory referral to Oncology    Referral Priority:   Routine    Referral Type:   Consultation    Referral Reason:   Specialty Services Required    Number of Visits Requested:   1    Patient instructions: Flu shot today  Pneumovax today  We will refer you back to Dr Burr Medico for cancer follow up.  We will refer you to skin doctor to check spot on face If interested, check with pharmacy about new 2 shot shigles series  (shingrix).  Work on Scientist, physiological.  Return as needed or in 1 year for next physical.   Follow up plan: Return in about 1 year (around 02/22/2021) for annual exam, prior fasting for blood work, medicare wellness visit.  Ria Bush, MD

## 2020-02-24 ENCOUNTER — Telehealth: Payer: Self-pay | Admitting: Hematology

## 2020-02-24 NOTE — Telephone Encounter (Signed)
Called pt per 2/8 sch message - no answer. Left message for patient with apt date and time

## 2020-03-08 DIAGNOSIS — C4441 Basal cell carcinoma of skin of scalp and neck: Secondary | ICD-10-CM | POA: Diagnosis not present

## 2020-03-08 DIAGNOSIS — L570 Actinic keratosis: Secondary | ICD-10-CM | POA: Diagnosis not present

## 2020-03-08 DIAGNOSIS — X32XXXA Exposure to sunlight, initial encounter: Secondary | ICD-10-CM | POA: Diagnosis not present

## 2020-03-16 NOTE — Progress Notes (Signed)
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 Resected and retrieved. - Rule out malignancy, tumor in the sigmoid colon. Biopsied. Tattooed. - Internal hemorrhoids. - The examination was otherwise normal on direct and retroflexion views.    03/14/2016 Imaging   CT A/P W CONTRAST  IMPRESSION: Short segment area of wall thickening in the distal sigmoid colon, suspicious for site of primary colon carcinoma. Recommend correlation with colonoscopy results.   04/23/2016 Imaging   CT Chest W Contrast IMPRESSION: 1. No evidence of pulmonary metastasis. 2. Bilateral calcified pulmonary nodules and mediastinal lymph nodes consistent with granulomatous disease. 3. Centrilobular emphysema. 4. Coronary artery calcification and aortic atherosclerotic calcification.   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 Colon, segmental resection for  tumor, sigmoid - INVASIVE ADENOCARCINOMA, WELL TO MODERATELY DIFFERENTIATED, WITH ABUNDANT MUCIN (PT3 PN1) - METASTATIC ADENOCARCINOMA IN ONE OF TWENTY-TWO LYMPH NODES (1/22) - TWO TUMOR DEPOSITS PRESENT - MARGINS UNINVOLVED BY CARCINOMA - HYPERPLASTIC POLYPS (X2) - SEE ONCOLOGY TABLE BELOW 2. Colon, resection margin (donut), final distal margin - BENIGN COLON - NO MALIGNANCY IDENTIFIED   05/21/2016 Surgery   LAPAROSCOPIC PARTIAL COLECTOMY, SPLENIC FLEXURE MOBILIZATION AND PROCTOSCOPY   06/25/2016 - 09/03/2016 Chemotherapy   FOLFOX every 2 weeks for 3 months    12/03/2016 Imaging   CT AP 12/03/16 IMPRESSION: Status post sigmoid resection. No evidence of recurrent or metastatic disease.   05/29/2017 Procedure   05/29/2017 Colonoscopy - One 7 mm polyp in the descending colon, removed with a cold snare. Resected and retrieved. - One 4 mm polyp in the transverse colon, removed with a cold biopsy forceps. Resected and retrieved. - Patent end-to-end colo-colonic anastomosis, characterized by healthy appearing mucosa. - Internal hemorrhoids. - The examination was otherwise normal on direct and retroflexion views.   05/29/2017 Pathology Results   05/29/2017 Surgical Pathology Diagnosis 1. Surgical [P], random sites - BENIGN COLONIC MUCOSA. - NO ACTIVE INFLAMMATION OR EVIDENCE OF MICROSCOPIC COLITIS. - NO DYSPLASIA OR MALIGNANCY. 2. Surgical [P], descending and transverse, polyp (2) - HYPERPLASTIC POLYP (X2 FRAGMENTS). - NO DYSPLASIA OR MALIGNANCY.   08/29/2017 Imaging   08/29/2017 CT CAP W Contrast IMPRESSION: 1. No findings of active malignancy. 2. 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 Chart review indicates that the patient was seen for this by Dr. Gae Gallop on 01/02/2017. 3. 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 4. Other imaging findings of potential clinical significance: Aortic Atherosclerosis  (ICD10-I70.0) and Emphysema (  ICD10-J43.9). Coronary atherosclerosis. 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 Small right scrotal hydrocele   09/08/2018 Imaging   CT CAP W Contrsat 09/08/18  IMPRESSION: 1. New areas of consolidated lung in the right upper lobe along with mild right hilar adenopathy. Cause for the consolidation is uncertain and by report the patient is without symptoms/complaints. Presumably this represents pneumonia, follow up chest radiography recommended to ensure clearance and exclude underlying malignancy. 2. 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 Aortic Atherosclerosis (ICD10-I70.0). Coronary atherosclerosis. 3. Other imaging findings of potential clinical significance: Emphysema (ICD10-J43.9). Old granulomatous disease. Borderline prominent common bile duct size at 0.7 cm in diameter.   10/17/2018 Imaging    CT Chest W contrast 10/17/18  IMPRESSION: Perhaps slight improvement in some areas of this right upper lobe process. Scarring from a recent episode of infection is considered. However, little change is seen within the short interval. Occult neoplasm in this area resulting in postobstructive changes should also be considered. Bronchoscopic assessment may be helpful.   No new areas of concern.   Aortic Atherosclerosis (ICD10-I70.0) and Emphysema (ICD10-J43.9).      CURRENT THERAPY:  Surveillance  INTERVAL HISTORY:  Mario Mcdowell is here for a follow up of colorectal cancer. He was last seen by me in 10/20202. He presents to the clinic alone. He notes he is doing well. He notes he had skin cancer of the front of his left ear that was removed. He notes he f/u with dermatologist about this. He notes he is still smoking and coughing. He has not cut back.     REVIEW OF SYSTEMS:   Constitutional: Denies fevers, chills or abnormal  weight loss Eyes: Denies blurriness of vision Ears, nose, mouth, throat, and face: Denies mucositis or sore throat Respiratory: Denies dyspnea or wheezes (+) Cough from smoking  Cardiovascular: Denies palpitation, chest discomfort or lower extremity swelling Gastrointestinal:  Denies nausea, heartburn or change in bowel habits Skin: Denies abnormal skin rashes Lymphatics: Denies new lymphadenopathy or easy bruising Neurological:Denies numbness, tingling or new weaknesses Behavioral/Psych: Mood is stable, no new changes  All other systems were reviewed with the patient and are negative.  MEDICAL HISTORY:  Past Medical History:  Diagnosis Date   Aorto-iliac atherosclerosis ([place])    by CT; in bilateral legs    Arthritis    Atherosclerotic PVD with intermittent claudication ([facility])    BPH without obstruction/lower urinary tract symptoms 10/25/2015   Centrilobular emphysema ([place])    per chest CT 04-23-2016   Colon adenocarcinoma [facility]) oncologist- dr Burr Medico   dx 05-21-2016 -- Stage IIIB (pT3,pN1,M0) , Invasive sigmoid adenocarcinoma well to moderately differentiated with metastatic intra-abdominal lymph node (1 out of 22), s/p lap. partial colectomy [name]) 05/21/2016 and chemotherapy is planned   Full dentures    History of kidney stones 07/2015   passed without intervention   Pulmonary nodules    per chest CT 04-23-2016 bilateral nodules (consistent w/ granulomatous disease)   Smokers' cough ([place])    Wears contact lenses     SURGICAL HISTORY: Past Surgical History:  Procedure Laterality Date   COLONOSCOPY  03/12/2016   4 polyps, sigmoid tumor biopsied (TVA w/ MICROSCOPIC FOCUS SUSPICIOUS FOR INVASION) pending surgical resection, rpt 1 yr [facility])   COLONOSCOPY  05/2017   2 benign polyps, rpt 2 yrs [facility])   LAPAROSCOPIC PARTIAL COLECTOMY N/A 05/21/2016   colon cancer, 1/20 positive lymp nodes. LAPAROSCOPIC PARTIAL COLECTOMY, SPLENIC FLEXURE  MOBILIZATION AND PROCTOSCOPY;   [name], MD)   MULTIPLE TOOTH EXTRACTIONS  [phone]   PORT-A-CATH REMOVAL Right 10/04/2016   Procedure: REMOVAL PORT-A-CATH;  Surgeon: [name], MD;  Location[facility];  Service: General;  Laterality: Right;   PORTACATH PLACEMENT N/A 06/21/2016   Procedure: INSERTION PORT-A-CATH;  Surgeon: [name], MD;  Location: [facility];  Service: General;  Laterality: N/A;  right   VIDEO BRONCHOSCOPY WITH ENDOBRONCHIAL NAVIGATION N/A 11/14/2018   VIDEO BRONCHOSCOPY WITH ENDOBRONCHIAL NAVIGATION for R lung cavitary lesions;  Icard, Octavio Graves, DO    I have reviewed the social history and family history with the patient and they are unchanged from previous note.  ALLERGIES:  is allergic to penicillins and lipitor [atorvastatin].  MEDICATIONS:  Current Outpatient Medications  Medication Sig Dispense Refill   aspirin EC 81 MG tablet Take 81 mg by mouth daily.     ibuprofen (ADVIL) 200 MG tablet Take 400 mg by mouth every 6 (six) hours as needed for moderate pain.      rosuvastatin (CRESTOR) 5 MG tablet Take 1 tablet (5 mg total) by mouth daily. 90 tablet 3   tiZANidine (ZANAFLEX) 4 MG tablet TAKE 1 TABLET BY MOUTH TWICE DAILY AS NEEDED FOR MUSCLE SPASM 30 tablet 0   No current facility-administered medications for this visit.    PHYSICAL EXAMINATION: ECOG PERFORMANCE STATUS: 0 - Asymptomatic  Vitals:   03/18/20 1407  BP: 128/81  Pulse: 78  Resp: 14  Temp: 97.7 F (36.5 C)  SpO2: 99%   Filed Weights   03/18/20 1407  Weight: 121 lb 8 oz (55.1 kg)    GENERAL:alert, no distress and comfortable SKIN: skin color, texture, turgor are normal, no rashes or significant lesions EYES: normal, Conjunctiva are pink and non-injected, sclera clear  NECK: supple, thyroid normal size, non-tender, without nodularity LYMPH:  no palpable lymphadenopathy in the cervical, axillary  LUNGS: clear to auscultation and percussion with normal breathing  effort HEART: regular rate & rhythm and no murmurs and no lower extremity edema ABDOMEN:abdomen soft, non-tender and normal bowel sounds Musculoskeletal:no cyanosis of digits and no clubbing  NEURO: alert & oriented x 3 with fluent speech, no focal motor/sensory deficits  LABORATORY DATA:  I have reviewed the data as listed CBC Latest Ref Rng & Units 03/18/2020 02/16/2020 11/14/2018  WBC 4.0 - 10.5 K/uL 8.7 7.4 9.0  Hemoglobin 13.0 - 17.0 g/dL 14.9 15.7 15.3  Hematocrit 39.0 - 52.0 % 43.6 46.1 45.2  Platelets 150 - 400 K/uL 176 200.0 202     CMP Latest Ref Rng & Units 03/18/2020 02/16/2020 11/14/2018  Glucose 70 - 99 mg/dL 108(H) 121(H) 96  BUN 8 - 23 mg/dL _0 Creatinine 0.61 - 1.24 mg/dL 0.90 0.88 0.87  Sodium 135 - 145 mmol/L 140 136 141  Potassium 3.5 - 5.1 mmol/L 4.0 4.1 4.1  Chloride 98 - 111 mmol/L 105 100 103  CO2 22 - 32 mmol/L _1 Calcium 8.9 - 10.3 mg/dL 9.0 9.4 9.3  Total Protein 6.5 - 8.1 g/dL 7.1 7.1 6.7  Total Bilirubin 0.3 - 1.2 mg/dL 0.2(L) 0.4 0.6  Alkaline Phos 38 - 126 U/L 88 87 74  AST 15 - 41 U/L _2 ALT 0 - 44 U/L _3 RADIOGRAPHIC STUDIES: I have personally reviewed the radiological images as listed and agreed with the findings in the report. No results found.  ASSESSMENT & PLAN:  ADITH TEJADA is a 70 y.o. male with   1. 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 He is s/phemicolectomy and treated with adjuvant FOLFOX.He is on surveillance now. -From a colon cancer standpoint he is clinically doing well. Labs reviewed, CBC and CMP WN. Physical exam unremarkable. There is no concern for cancer recurrence.  -He is almost 4 years since his cancer diagnosis. His risk of recurrence has significantly decreased. I do not plan to do more routine surveillance CT scans unless he develops concerning symptoms. Last Colonoscopy in 07/2019.  -F/u in 1 year for  last surveillance visit.   2. Right upper lobe lung opacification  -found on routine surveillance CT scan for his colon cancer on September 05, 2018. -He was asymptomatic. He was previously treated with course of doxycycline. -CT scan from October 17, 2018 showed persistent right upper lobe two opacification with areas of cavitation, 5.4 x 4.0 cm, and 3.0X1.7cm, slightly smaller compared to last scan. -Patient is a heavy smoker, still actively smoking, he is at high risk for lung cancer. -His 11/14/18 Bronchoscopy was negative. He will continue to f/u with Pulmonologist Dr Loanne Drilling with repeating CT chest scans.   3. Smoking cessation, Emphysema -HepreviouslydeclinedNicotine patches or counseling to help him quit. He has tried to quit, but unsuccessful. I have repeatedly counseled him on smoking cessation.  -He is still smoking 1 pack or less a day. He has stable cough from emphysema.  -He notes he has received his COVID series and booster. He also had flu shot in 2021.   4. BPH -12/27/19PSA normal at 0.84 -Will continue to be followed by his PCP.   5. PAD -PriorCT scan showed complete occlusion of his abdominal aorta. He is reluctant to proceed with surgery. He will f/u with vascular surgeon Dr. Nadyne Coombes he is ready to proceed  PLAN -Lab and f/u in 1 year -I encouraged pt to F/u with Dr Loanne Drilling with CT Chest, I will send a message to Dr. Loanne Drilling    No problem-specific Assessment & Plan notes found for this encounter.   No orders of the defined types were placed in this encounter.  All questions were answered. The patient knows to call the clinic with any problems, questions or concerns. No barriers to learning was detected. The total time spent in the appointment was 25 minutes.     Truitt Merle, MD 03/18/2020   I, Joslyn Devon, am acting as scribe for Truitt Merle, MD.   I have reviewed the above documentation for accuracy and completeness, and I agree with the above.

## 2020-03-18 ENCOUNTER — Inpatient Hospital Stay: Payer: Medicare Other

## 2020-03-18 ENCOUNTER — Other Ambulatory Visit: Payer: Self-pay

## 2020-03-18 ENCOUNTER — Inpatient Hospital Stay: Payer: Medicare Other | Attending: Hematology | Admitting: Hematology

## 2020-03-18 ENCOUNTER — Encounter: Payer: Self-pay | Admitting: Hematology

## 2020-03-18 VITALS — BP 128/81 | HR 78 | Temp 97.7°F | Resp 14 | Ht 63.25 in | Wt 121.5 lb

## 2020-03-18 DIAGNOSIS — R918 Other nonspecific abnormal finding of lung field: Secondary | ICD-10-CM | POA: Diagnosis not present

## 2020-03-18 DIAGNOSIS — J432 Centrilobular emphysema: Secondary | ICD-10-CM | POA: Insufficient documentation

## 2020-03-18 DIAGNOSIS — Z7982 Long term (current) use of aspirin: Secondary | ICD-10-CM | POA: Insufficient documentation

## 2020-03-18 DIAGNOSIS — I2510 Atherosclerotic heart disease of native coronary artery without angina pectoris: Secondary | ICD-10-CM | POA: Insufficient documentation

## 2020-03-18 DIAGNOSIS — C772 Secondary and unspecified malignant neoplasm of intra-abdominal lymph nodes: Secondary | ICD-10-CM | POA: Insufficient documentation

## 2020-03-18 DIAGNOSIS — C187 Malignant neoplasm of sigmoid colon: Secondary | ICD-10-CM

## 2020-03-18 DIAGNOSIS — J439 Emphysema, unspecified: Secondary | ICD-10-CM | POA: Diagnosis not present

## 2020-03-18 DIAGNOSIS — Z9221 Personal history of antineoplastic chemotherapy: Secondary | ICD-10-CM | POA: Insufficient documentation

## 2020-03-18 DIAGNOSIS — Z87442 Personal history of urinary calculi: Secondary | ICD-10-CM | POA: Insufficient documentation

## 2020-03-18 DIAGNOSIS — I700 Atherosclerosis of aorta: Secondary | ICD-10-CM | POA: Diagnosis not present

## 2020-03-18 DIAGNOSIS — I7389 Other specified peripheral vascular diseases: Secondary | ICD-10-CM | POA: Diagnosis not present

## 2020-03-18 DIAGNOSIS — Z85038 Personal history of other malignant neoplasm of large intestine: Secondary | ICD-10-CM | POA: Diagnosis not present

## 2020-03-18 DIAGNOSIS — N400 Enlarged prostate without lower urinary tract symptoms: Secondary | ICD-10-CM | POA: Diagnosis not present

## 2020-03-18 DIAGNOSIS — K621 Rectal polyp: Secondary | ICD-10-CM | POA: Diagnosis not present

## 2020-03-18 DIAGNOSIS — Z79899 Other long term (current) drug therapy: Secondary | ICD-10-CM | POA: Insufficient documentation

## 2020-03-18 DIAGNOSIS — F17210 Nicotine dependence, cigarettes, uncomplicated: Secondary | ICD-10-CM | POA: Diagnosis not present

## 2020-03-18 LAB — CBC WITH DIFFERENTIAL (CANCER CENTER ONLY)
Abs Immature Granulocytes: 0.01 10*3/uL (ref 0.00–0.07)
Basophils Absolute: 0.1 10*3/uL (ref 0.0–0.1)
Basophils Relative: 1 %
Eosinophils Absolute: 0.1 10*3/uL (ref 0.0–0.5)
Eosinophils Relative: 2 %
HCT: 43.6 % (ref 39.0–52.0)
Hemoglobin: 14.9 g/dL (ref 13.0–17.0)
Immature Granulocytes: 0 %
Lymphocytes Relative: 29 %
Lymphs Abs: 2.5 10*3/uL (ref 0.7–4.0)
MCH: 31.3 pg (ref 26.0–34.0)
MCHC: 34.2 g/dL (ref 30.0–36.0)
MCV: 91.6 fL (ref 80.0–100.0)
Monocytes Absolute: 0.7 10*3/uL (ref 0.1–1.0)
Monocytes Relative: 8 %
Neutro Abs: 5.3 10*3/uL (ref 1.7–7.7)
Neutrophils Relative %: 60 %
Platelet Count: 176 10*3/uL (ref 150–400)
RBC: 4.76 MIL/uL (ref 4.22–5.81)
RDW: 14.5 % (ref 11.5–15.5)
WBC Count: 8.7 10*3/uL (ref 4.0–10.5)
nRBC: 0 % (ref 0.0–0.2)

## 2020-03-18 LAB — CMP (CANCER CENTER ONLY)
ALT: 15 U/L (ref 0–44)
AST: 18 U/L (ref 15–41)
Albumin: 3.6 g/dL (ref 3.5–5.0)
Alkaline Phosphatase: 88 U/L (ref 38–126)
Anion gap: 10 (ref 5–15)
BUN: 15 mg/dL (ref 8–23)
CO2: 25 mmol/L (ref 22–32)
Calcium: 9 mg/dL (ref 8.9–10.3)
Chloride: 105 mmol/L (ref 98–111)
Creatinine: 0.9 mg/dL (ref 0.61–1.24)
GFR, Estimated: >60 mL/min (ref >60)
Glucose, Bld: 108 mg/dL — ABNORMAL HIGH (ref 70–99)
Potassium: 4 mmol/L (ref 3.5–5.1)
Sodium: 140 mmol/L (ref 135–145)
Total Bilirubin: 0.2 mg/dL — ABNORMAL LOW (ref 0.3–1.2)
Total Protein: 7.1 g/dL (ref 6.5–8.1)

## 2020-03-21 ENCOUNTER — Telehealth: Payer: Self-pay | Admitting: Hematology

## 2020-03-21 ENCOUNTER — Telehealth: Payer: Self-pay

## 2020-03-21 LAB — CEA (IN HOUSE-CHCC): CEA (CHCC-In House): 4.26 ng/mL (ref 0.00–5.00)

## 2020-03-21 NOTE — Telephone Encounter (Signed)
Called patient per Dr Cordelia Pen request to schedule office visit in the month of April with Dr Loanne Drilling. Left voicemail to please return phone call to schedule follow up appointment with Dr Loanne Drilling. Contact number provided.

## 2020-03-21 NOTE — Telephone Encounter (Signed)
Scheduled appt per 3/4 LOS - mailed letter with appt date and time .

## 2020-03-22 ENCOUNTER — Telehealth: Payer: Self-pay

## 2020-03-22 NOTE — Telephone Encounter (Signed)
Duplicate note- error

## 2020-03-22 NOTE — Telephone Encounter (Signed)
-----   Message from Truitt Merle, MD sent at 03/22/2020 12:09 PM EST ----- Let pt know his tumor marker was good last week, good news, thanks   Truitt Merle

## 2020-03-22 NOTE — Telephone Encounter (Signed)
Called and left message concerning most recent lab "tumor marker good last week" pt encouraged to call for any changes or concerns

## 2020-03-27 ENCOUNTER — Other Ambulatory Visit: Payer: Self-pay | Admitting: Family Medicine

## 2020-03-29 ENCOUNTER — Other Ambulatory Visit: Payer: Self-pay | Admitting: Family Medicine

## 2020-04-04 DIAGNOSIS — L570 Actinic keratosis: Secondary | ICD-10-CM | POA: Diagnosis not present

## 2020-04-04 DIAGNOSIS — Z85828 Personal history of other malignant neoplasm of skin: Secondary | ICD-10-CM | POA: Diagnosis not present

## 2020-04-04 DIAGNOSIS — X32XXXD Exposure to sunlight, subsequent encounter: Secondary | ICD-10-CM | POA: Diagnosis not present

## 2020-04-04 DIAGNOSIS — Z08 Encounter for follow-up examination after completed treatment for malignant neoplasm: Secondary | ICD-10-CM | POA: Diagnosis not present

## 2020-04-06 ENCOUNTER — Ambulatory Visit: Payer: Medicare Other | Admitting: Vascular Surgery

## 2020-04-06 ENCOUNTER — Ambulatory Visit (INDEPENDENT_AMBULATORY_CARE_PROVIDER_SITE_OTHER)
Admission: RE | Admit: 2020-04-06 | Discharge: 2020-04-06 | Disposition: A | Discharge status: Home / Self Care | Payer: Medicare Other | Source: Ambulatory Visit | Attending: Vascular Surgery | Admitting: Vascular Surgery

## 2020-04-06 ENCOUNTER — Encounter: Payer: Self-pay | Admitting: Vascular Surgery

## 2020-04-06 ENCOUNTER — Other Ambulatory Visit: Payer: Self-pay

## 2020-04-06 ENCOUNTER — Ambulatory Visit (HOSPITAL_COMMUNITY)
Admission: RE | Admit: 2020-04-06 | Discharge: 2020-04-06 | Disposition: A | Discharge status: Home / Self Care | Payer: Medicare Other | Source: Ambulatory Visit | Attending: Vascular Surgery | Admitting: Vascular Surgery

## 2020-04-06 VITALS — BP 151/73 | HR 67 | Temp 98.1°F | Resp 20 | Ht 63.5 in | Wt 119.6 lb

## 2020-04-06 DIAGNOSIS — I739 Peripheral vascular disease, unspecified: Secondary | ICD-10-CM

## 2020-04-06 DIAGNOSIS — R0989 Other specified symptoms and signs involving the circulatory and respiratory systems: Secondary | ICD-10-CM

## 2020-04-06 NOTE — Progress Notes (Signed)
REASON FOR VISIT:   Follow-up of peripheral vascular disease.  MEDICAL ISSUES:   AORTOILIAC OCCLUSIVE DISEASE: The patient has a known aortic occlusion but is really asymptomatic.  He denies any claudication, rest pain, or nonhealing ulcers.  I encouraged him to stay as active as possible.  He does continue to smoke and again I discussed the importance of tobacco cessation.  I have ordered follow-up ABIs in 1 year and I will see him back at that time.  He knows to call sooner if he has problems.  Given that he has had 2 normal carotid studies I do not think routine carotid studies are necessary unless he develops any new symptoms or new carotid bruit.  Therefore have not ordered a carotid duplex.  Okay   HPI:   Mario Mcdowell is a pleasant 70 y.o. male with a known aortic occlusion and mild carotid disease.  In December 2018 he had a 40 to 59% left carotid stenosis but on his subsequent study in February of last year this had improved to less than 39%.  He has a known aortic occlusion with stable claudication.  He comes in for a 1 year follow-up visit.  Since I saw him last, he denies any history of stroke, TIAs, expressive or receptive aphasia, or amaurosis fugax.  He denies any claudication.  He occasionally gets some paresthesias in the left foot at night.  He denies any rest pain.  Has had no nonhealing ulcers.  He is on aspirin and is on a statin.  He continues to smoke 1 pack/day and has been smoking since he was 70 years old.  Past Medical History:  Diagnosis Date  . Aorto-iliac atherosclerosis ([place])    by CT; in bilateral legs   . Arthritis   . Atherosclerotic PVD with intermittent claudication ([place])   . BPH without obstruction/lower urinary tract symptoms 10/25/2015  . Centrilobular emphysema ([place])    per chest CT 04-23-2016  . Colon adenocarcinoma [facility]) oncologist- dr Burr Medico   dx 05-21-2016 -- Stage IIIB (pT3,pN1,M0) , Invasive sigmoid adenocarcinoma well to moderately  differentiated with metastatic intra-abdominal lymph node (1 out of 22), s/p lap. partial colectomy [name]) 05/21/2016 and chemotherapy is planned  . Full dentures   . History of kidney stones 07/2015   passed without intervention  . Pulmonary nodules    per chest CT 04-23-2016 bilateral nodules (consistent w/ granulomatous disease)  . Smokers' cough ([place])   . Wears contact lenses     Family History  Problem Relation Age of Onset  . Cancer Paternal Aunt        unsure details  . Diabetes Cousin   . Cancer Paternal Uncle        unknow type cancer   . Prostate cancer Neg Hx   . Kidney cancer Neg Hx   . CAD Neg Hx   . Stroke Neg Hx   . Colon cancer Neg Hx   . Esophageal cancer Neg Hx   . Stomach cancer Neg Hx   . Rectal cancer Neg Hx     SOCIAL HISTORY: Social History   Tobacco Use  . Smoking status: Current Every Day Smoker    Packs/day: 1.00    Years: 46.00    Pack years: 46.00    Types: Cigarettes  . Smokeless tobacco: Never Used  . Tobacco comment: 3/4 pack daily  Substance Use Topics  . Alcohol use: No    Comment: quit 2000    Allergies  Allergen Reactions  . Penicillins Swelling    Did it involve swelling of the face/tongue/throat, SOB, or low BP? Yes Did it involve sudden or severe rash/hives, skin peeling, or any reaction on the inside of your mouth or nose? No Did you need to seek medical attention at a hospital or doctor's office? No When did it last happen?childhood allergy If all above answers are "NO", may proceed with cephalosporin use.    . Lipitor [Atorvastatin] Nausea Only    Current Outpatient Medications  Medication Sig Dispense Refill  . aspirin EC 81 MG tablet Take 81 mg by mouth daily.    . rosuvastatin (CRESTOR) 5 MG tablet Take 1 tablet (5 mg total) by mouth daily. 90 tablet 3  . ibuprofen (ADVIL) 200 MG tablet Take 400 mg by mouth every 6 (six) hours as needed for moderate pain.  (Patient not taking: Reported on 04/06/2020)    .  tiZANidine (ZANAFLEX) 4 MG tablet TAKE 1 TABLET BY MOUTH TWICE DAILY AS NEEDED FOR MUSCLE SPASM (Patient not taking: Reported on 04/06/2020) 30 tablet 0   No current facility-administered medications for this visit.    REVIEW OF SYSTEMS:  [X]  denotes positive finding, [ ]  denotes negative finding Cardiac  Comments:  Chest pain or chest pressure:    Shortness of breath upon exertion:    Short of breath when lying flat:    Irregular heart rhythm:        Vascular    Pain in calf, thigh, or hip brought on by ambulation:    Pain in feet at night that wakes you up from your sleep:     Blood clot in your veins:    Leg swelling:         Pulmonary    Oxygen at home:    Productive cough:     Wheezing:         Neurologic    Sudden weakness in arms or legs:     Sudden numbness in arms or legs:     Sudden onset of difficulty speaking or slurred speech:    Temporary loss of vision in one eye:     Problems with dizziness:         Gastrointestinal    Blood in stool:     Vomited blood:         Genitourinary    Burning when urinating:     Blood in urine:        Psychiatric    Major depression:         Hematologic    Bleeding problems:    Problems with blood clotting too easily:        Skin    Rashes or ulcers:        Constitutional    Fever or chills:     PHYSICAL EXAM:   Vitals:   04/06/20 1105  BP: (!) 151/73  Pulse: 67  Resp: 20  Temp: 98.1 F (36.7 C)  SpO2: 95%  Weight: 54.3 kg  Height: 5' 3.5" (1.613 m)    GENERAL: The patient is a well-nourished male, in no acute distress. The vital signs are documented above. CARDIAC: There is a regular rate and rhythm.  VASCULAR: I do not detect carotid bruits. I cannot palpate femoral or pedal pulses. PULMONARY: There is good air exchange bilaterally without wheezing or rales. ABDOMEN: Soft and non-tender with normal pitched bowel sounds.  MUSCULOSKELETAL: There are no major deformities or cyanosis. NEUROLOGIC: No focal  weakness  or paresthesias are detected. SKIN: There are no ulcers or rashes noted. PSYCHIATRIC: The patient has a normal affect.  DATA:    ARTERIAL DOPPLER STUDY: I have independently interpreted his arterial Doppler study today.  On the right side he has a monophasic dorsalis pedis and posterior tibial signal.  ABI is 48%.  Toe pressures 56 mmHg.  On the left side he has a monophasic dorsalis pedis and posterior tibial signal.  ABI is 44%.  Toe pressure is 34 mmHg.  CAROTID DUPLEX: I have independently interpreted his carotid duplex scan today.  On the right side he has a less than 39% stenosis.  The right vertebral artery has antegrade flow.  On the left side, there is a less than 39% stenosis.  There is bidirectional flow in the left vertebral artery.  There is elevated velocities in the left subclavian artery suggesting a left subclavian artery stenosis.    Deitra Mayo [facility] [phone]

## 2020-04-29 ENCOUNTER — Other Ambulatory Visit: Payer: Self-pay

## 2020-04-29 ENCOUNTER — Ambulatory Visit (INDEPENDENT_AMBULATORY_CARE_PROVIDER_SITE_OTHER)
Admission: RE | Admit: 2020-04-29 | Discharge: 2020-04-29 | Disposition: A | Discharge status: Home / Self Care | Payer: Medicare Other | Source: Ambulatory Visit | Attending: Pulmonary Disease | Admitting: Pulmonary Disease

## 2020-04-29 DIAGNOSIS — J189 Pneumonia, unspecified organism: Secondary | ICD-10-CM | POA: Diagnosis not present

## 2020-04-29 DIAGNOSIS — Z720 Tobacco use: Secondary | ICD-10-CM | POA: Diagnosis not present

## 2020-04-29 DIAGNOSIS — J984 Other disorders of lung: Secondary | ICD-10-CM | POA: Diagnosis not present

## 2020-04-29 DIAGNOSIS — J439 Emphysema, unspecified: Secondary | ICD-10-CM | POA: Diagnosis not present

## 2020-05-03 ENCOUNTER — Ambulatory Visit: Payer: Medicare Other | Admitting: Pulmonary Disease

## 2020-05-03 ENCOUNTER — Encounter: Payer: Self-pay | Admitting: Pulmonary Disease

## 2020-05-03 ENCOUNTER — Other Ambulatory Visit: Payer: Self-pay

## 2020-05-03 VITALS — BP 132/78 | HR 66 | Temp 98.2°F | Ht 65.0 in | Wt 120.0 lb

## 2020-05-03 DIAGNOSIS — Z720 Tobacco use: Secondary | ICD-10-CM | POA: Diagnosis not present

## 2020-05-03 DIAGNOSIS — J432 Centrilobular emphysema: Secondary | ICD-10-CM

## 2020-05-03 DIAGNOSIS — J984 Other disorders of lung: Secondary | ICD-10-CM | POA: Diagnosis not present

## 2020-05-03 NOTE — Patient Instructions (Addendum)
Cavitary lesions - stable Interval development of nodular opacities concerning for infectious etiology however in the absence of any symptoms I would not treat this.  --CT chest without contrast in one year --If you develop respiratory symptoms before this, please contact our office and we can schedule imaging sooner  Follow-up with me one year

## 2020-05-03 NOTE — Progress Notes (Signed)
Subjective:   PATIENT ID: Mario Mcdowell GENDER: male DOB: Apr 07, 1950, MRN: [id number]   HPI  Chief Complaint  Patient presents with  . Follow-up    No complaints    Reason for Visit: Follow-up CT  Mario Mcdowell is a 70 year old male current smoker with stage IIIB colon cancer s/p hemicolectomy s/p FOLFOX in remission who presents for follow-up for abnormal CT.  Synopsis: He underwent navigational bronchoscopy on 11/14/18. Results with no evidence of cancer. Focal non-caseating granuloma in the right upper lobe.  Since our last visit on 10/30/19, he denies any respiratory symptoms. No shortness of breath, cough or wheezing. He is an active smoker 1 ppd but denies any limitation in activity. Does not plan to quit smoking. Not currently on maintenance inhalers. Denies unexplained fevers, chills, cough, night sweats or unintentional weight loss.  Social History: Active smoker. 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 travel, exposure to individuals with homelessness or jail/prison  I have personally reviewed patient's past medical/family/social history/allergies/current medications.  Past Medical History:  Diagnosis Date  . Aorto-iliac atherosclerosis ([place])    by CT; in bilateral legs   . Arthritis   . Atherosclerotic PVD with intermittent claudication ([place])   . BPH without obstruction/lower urinary tract symptoms 10/25/2015  . Centrilobular emphysema ([place])    per chest CT 04-23-2016  . Colon adenocarcinoma [facility]) oncologist- dr Burr Medico   dx 05-21-2016 -- Stage IIIB (pT3,pN1,M0) , Invasive sigmoid adenocarcinoma well to moderately differentiated with metastatic intra-abdominal lymph node (1 out of 22), s/p lap. partial colectomy [name]) 05/21/2016 and chemotherapy is planned  . Full dentures   . History of kidney stones 07/2015   passed without intervention  . Pulmonary nodules    per chest CT 04-23-2016 bilateral nodules (consistent w/ granulomatous disease)   . Smokers' cough ([place])   . Wears contact lenses     Allergies  Allergen Reactions  . Penicillins Swelling    Did it involve swelling of the face/tongue/throat, SOB, or low BP? Yes Did it involve sudden or severe rash/hives, skin peeling, or any reaction on the inside of your mouth or nose? No Did you need to seek medical attention at a hospital or doctor's office? No When did it last happen?childhood allergy If all above answers are "NO", may proceed with cephalosporin use.    . Lipitor [Atorvastatin] Nausea Only     Outpatient Medications Prior to Visit  Medication Sig Dispense Refill  . aspirin EC 81 MG tablet Take 81 mg by mouth daily.    Marland Kitchen ibuprofen (ADVIL) 200 MG tablet Take 400 mg by mouth every 6 (six) hours as needed for moderate pain.    . rosuvastatin (CRESTOR) 5 MG tablet Take 1 tablet (5 mg total) by mouth daily. 90 tablet 3  . tiZANidine (ZANAFLEX) 4 MG tablet TAKE 1 TABLET BY MOUTH TWICE DAILY AS NEEDED FOR MUSCLE SPASM 30 tablet 0   No facility-administered medications prior to visit.    Review of Systems  Constitutional: Negative for chills, diaphoresis, fever, malaise/fatigue and weight loss.  HENT: Negative for congestion.   Respiratory: Negative for cough, hemoptysis, sputum production, shortness of breath and wheezing.   Cardiovascular: Negative for chest pain, palpitations and leg swelling.     Objective:   Vitals:   05/03/20 1515  BP: 132/78  Pulse: 66  Temp: 98.2 F (36.8 C)  SpO2: 96%  Weight: 120 lb (54.4 kg)  Height: 5\' 5"  (1.651 m)  SpO2: 96 % O2 Device: None (Room air)  Physical Exam: General: Well-appearing, no acute distress HENT: , AT Eyes: EOMI, no scleral icterus Respiratory: Clear to auscultation bilaterally.  No crackles, wheezing or rales Cardiovascular: RRR, -M/R/G, no JVD Extremities:-Edema,-tenderness, clubbing on hands Neuro: AAO x4, CNII-XII grossly intact Skin: Intact, no rashes or bruising Psych: Normal  mood, normal affect   Data Reviewed:  Imaging: CT Chest 10/17/18 - 5x4cm and 3x2cm lung masses, slightly improved compared to prior imaging. Background emphysema. CT Chest 02/21/19 - Improved but persistent RUL consolidation. Background emphysema unchanged CT Chest 10/07/19 - Similar posterior RUL cavitary lesions including 4x4cm. Anterior RUL 2.7 x 1cm lesion with increased cavitation.  CT Chest 04/29/20 - unchanged cavitary lesions with interval nodular airspace disease in the right upper lobe. Emphysema.  PFT: None on file  Imaging, labs and test noted above have been reviewed independently by me.    Assessment & Plan:   Discussion: 70 year old male active smoker with hx of adenocarcinoma of the sigmoid colon s/p hemicolectomy and FOLFOX, now in remission. Initially referred to me for RUL cavitary masses s/p navigational bronch on 11/14/18 which demonstrated focal non-caseating granuloma, no malignancy. He has been under surveillance imaging. We reviewed CT from 04/29/20 togeher with unchanged cavitary lesions with interval nodular airspace disease in the right upper lobe. May be infectious.. Also entertaining that this may be secondary to sarcoid. However given patient is asymptomatic, no indication for treatment at this time.  Granulomatous Lung Cavitary lesions - stable Interval development of nodular opacities concerning for infectious etiology however in the absence of any symptoms I would not treat this.  --CT chest without contrast in one year --If you develop respiratory symptoms before this, please contact our office and we can schedule imaging sooner  Emphysema --No indication for bronchodilators  Tobacco abuse Patient remains active smoker.  --Counseled on smoking cessation for <3 min however patient is not interested in quitting   Health Maintenance Immunization History  Administered Date(s) Administered  . Fluad Quad(high Dose 65+) 10/30/2018, 02/23/2020  .  Influenza,inj,Quad PF,6+ Mos 12/05/2016, 12/19/2017  . PFIZER(Purple Top)SARS-COV-2 Vaccination 03/16/2019, 04/06/2019, 12/25/2019  . Pneumococcal Conjugate-13 01/19/2019  . Pneumococcal Polysaccharide-23 02/23/2020   Orders Placed This Encounter  Procedures  . CT Chest Wo Contrast    Standing Status:   Future    Standing Expiration Date:   05/03/2021    Scheduling Instructions:     Schedule in 1 year    Order Specific Question:   Preferred imaging location?    Answer:   [place]   No orders of the defined types were placed in this encounter.  Return in about 1 year (around 05/03/2021).   I have spent a total time of 35-minutes on the day of the appointment reviewing prior documentation, coordinating care and discussing medical diagnosis and plan with the patient/family. Imaging, labs and tests included in this note have been reviewed and interpreted independently by me.  [place], MD [facility] 05/03/2020 4:00 PM  Office Number [phone]

## 2020-05-10 ENCOUNTER — Encounter: Payer: Self-pay | Admitting: Pulmonary Disease

## 2020-07-15 ENCOUNTER — Encounter: Payer: Self-pay | Admitting: Hematology

## 2021-01-12 ENCOUNTER — Encounter: Payer: Self-pay | Admitting: Family Medicine

## 2021-02-11 ENCOUNTER — Other Ambulatory Visit: Payer: Self-pay | Admitting: Family Medicine

## 2021-02-11 DIAGNOSIS — C772 Secondary and unspecified malignant neoplasm of intra-abdominal lymph nodes: Secondary | ICD-10-CM

## 2021-02-11 DIAGNOSIS — E7800 Pure hypercholesterolemia, unspecified: Secondary | ICD-10-CM

## 2021-02-11 DIAGNOSIS — C187 Malignant neoplasm of sigmoid colon: Secondary | ICD-10-CM

## 2021-02-11 DIAGNOSIS — N400 Enlarged prostate without lower urinary tract symptoms: Secondary | ICD-10-CM

## 2021-02-11 DIAGNOSIS — R7303 Prediabetes: Secondary | ICD-10-CM

## 2021-02-16 NOTE — Progress Notes (Signed)
Subjective:   Mario Mcdowell is a 71 y.o. male who presents for Medicare Annual/Subsequent preventive examination.  I connected with [name] today by telephone and verified that I am speaking with the correct person using two identifiers. Location patient: home Location provider: work Persons participating in the virtual visit: patient, Marine scientist.    I discussed the limitations, risks, security and privacy concerns of performing an evaluation and management service by telephone and the availability of in person appointments. I also discussed with the patient that there may be a patient responsible charge related to this service. The patient expressed understanding and verbally consented to this telephonic visit.    Interactive audio and video telecommunications were attempted between this provider and patient, however failed, due to patient having technical difficulties OR patient did not have access to video capability.  We continued and completed visit with audio only.  Some vital signs may be absent or patient reported.   Time Spent with patient on telephone encounter: 25 minutes  Review of Systems     Cardiac Risk Factors include: advanced age (>103men, >76 women);dyslipidemia     Objective:    Today's Vitals   02/17/21 0820  Weight: 116 lb (52.6 kg)  Height: 5\' 5"  (1.651 m)   Body mass index is 19.3 kg/m.  Advanced Directives 02/17/2021 02/17/2020 03/04/2019 01/15/2019 11/14/2018 01/10/2018 11/27/2017  Does Patient Have a Medical Advance Directive? Yes No No No No No No  Type of Paramedic of [place];Living will - - - - - -  Does patient want to make changes to medical advance directive? Yes (MAU/Ambulatory/Procedural Areas - Information given) No - Patient declined - - - - -  Would patient like information on creating a medical advance directive? - - No - Patient declined No - Patient declined No - Patient declined No - Patient declined -    Current  Medications (verified) Outpatient Encounter Medications as of 02/17/2021  Medication Sig   aspirin EC 81 MG tablet Take 81 mg by mouth daily.   rosuvastatin (CRESTOR) 5 MG tablet Take 1 tablet (5 mg total) by mouth daily.   ibuprofen (ADVIL) 200 MG tablet Take 400 mg by mouth every 6 (six) hours as needed for moderate pain. (Patient not taking: Reported on 02/17/2021)   tiZANidine (ZANAFLEX) 4 MG tablet TAKE 1 TABLET BY MOUTH TWICE DAILY AS NEEDED FOR MUSCLE SPASM (Patient not taking: Reported on 02/17/2021)   No facility-administered encounter medications on file as of 02/17/2021.    Allergies (verified) Penicillins and Lipitor [atorvastatin]   History: Past Medical History:  Diagnosis Date   Aorto-iliac atherosclerosis ([place])    by CT; in bilateral legs    Arthritis    Atherosclerotic PVD with intermittent claudication ([facility])    BPH without obstruction/lower urinary tract symptoms 10/25/2015   Centrilobular emphysema ([place])    per chest CT 04-23-2016   Colon adenocarcinoma [facility]) oncologist- dr Burr Medico   dx 05-21-2016 -- Stage IIIB (pT3,pN1,M0) , Invasive sigmoid adenocarcinoma well to moderately differentiated with metastatic intra-abdominal lymph node (1 out of 22), s/p lap. partial colectomy [name]) 05/21/2016 and chemotherapy is planned   Full dentures    History of kidney stones 07/2015   passed without intervention   Pulmonary nodules    per chest CT 04-23-2016 bilateral nodules (consistent w/ granulomatous disease)   Smokers' cough ([place])    Wears contact lenses    Past Surgical History:  Procedure Laterality Date   COLONOSCOPY  03/12/2016  4 polyps, sigmoid tumor biopsied (TVA w/ MICROSCOPIC FOCUS SUSPICIOUS FOR INVASION) pending surgical resection, rpt 1 yr Fuller Plan)   COLONOSCOPY  05/2017   2 benign polyps, rpt 2 yrs Fuller Plan)   COLONOSCOPY  07/2019   TAs, rpt 2 yrs Fuller Plan)   LAPAROSCOPIC PARTIAL COLECTOMY N/A 05/21/2016   colon cancer, 1/20 positive lymp nodes. LAPAROSCOPIC  PARTIAL COLECTOMY, SPLENIC FLEXURE MOBILIZATION AND PROCTOSCOPY;  [name], MD)   MULTIPLE TOOTH EXTRACTIONS  [id number]   PORT-A-CATH REMOVAL Right 10/04/2016   Procedure: REMOVAL PORT-A-CATH;  Surgeon: [name], MD;  Location: [facility];  Service: General;  Laterality: Right;   PORTACATH PLACEMENT N/A 06/21/2016   Procedure: INSERTION PORT-A-CATH;  Surgeon: [name], MD;  Location: [facility];  Service: General;  Laterality: N/A;  right   VIDEO BRONCHOSCOPY WITH ENDOBRONCHIAL NAVIGATION N/A 11/14/2018   VIDEO BRONCHOSCOPY WITH ENDOBRONCHIAL NAVIGATION for R lung cavitary lesions;  Icard, Octavio Graves, DO   Family History  Problem Relation Age of Onset   Cancer Paternal Aunt        unsure details   Diabetes Cousin    Cancer Paternal Uncle        unknow type cancer    Prostate cancer Neg Hx    Kidney cancer Neg Hx    CAD Neg Hx    Stroke Neg Hx    Colon cancer Neg Hx    Esophageal cancer Neg Hx    Stomach cancer Neg Hx    Rectal cancer Neg Hx    Social History   Socioeconomic History   Marital status: Married    Spouse name: Not on file   Number of children: Not on file   Years of education: Not on file   Highest education level: Not on file  Occupational History   Not on file  Tobacco Use   Smoking status: Every Day    Packs/day: 1.00    Years: 46.00    Pack years: 46.00    Types: Cigarettes   Smokeless tobacco: Never   Tobacco comments:    3/4 pack daily  Vaping Use   Vaping Use: Never used  Substance and Sexual Activity   Alcohol use: No    Comment: quit 2000   Drug use: No   Sexual activity: Not Currently    Birth control/protection: Abstinence  Other Topics Concern   Not on file  Social History Narrative   Lives with wife, adopted daughter, adopted son   Occ: retired Games developer   Edu: 8th grade    Activity: no regular exercise   Diet: no water - causes nausea. Fruits/vegetables daily.    Social Determinants  of Health   Financial Resource Strain: Low Risk    Difficulty of Paying Living Expenses: Not hard at all  Food Insecurity: No Food Insecurity   Worried About Charity fundraiser in the Last Year: Never true   [place] in the Last Year: Never true  Transportation Needs: No Transportation Needs   Lack of Transportation (Medical): No   Lack of Transportation (Non-Medical): No  Physical Activity: Sufficiently Active   Days of Exercise per Week: 3 days   Minutes of Exercise per Session: 60 min  Stress: No Stress Concern Present   Feeling of Stress : Not at all  Social Connections: Socially Isolated   Frequency of Communication with Friends and Family: Once a week   Frequency of Social Gatherings with Friends and Family: Once a week  Attends Religious Services: Never   Active Member of Clubs or Organizations: No   Attends Archivist Meetings: Never   Marital Status: Married    Tobacco Counseling Ready to quit: Not Answered Counseling given: Not Answered Tobacco comments: 3/4 pack daily   Clinical Intake:  Pre-visit preparation completed: Yes  Pain : No/denies pain     BMI - recorded: 19.3 Nutritional Status: BMI of 19-24  Normal Nutritional Risks: Unintentional weight loss Diabetes: No  How often do you need to have someone help you when you read instructions, pamphlets, or other written materials from your doctor or pharmacy?: 1 - Never  Diabetic? No  Interpreter Needed?: No  Information entered by :: Orrin Brigham LPN   Activities of Daily Living In your present state of health, do you have any difficulty performing the following activities: 02/17/2021  Hearing? N  Vision? Y  Difficulty concentrating or making decisions? N  Walking or climbing stairs? Y  Comment due to circulation issues in legs  Dressing or bathing? N  Doing errands, shopping? N  Preparing Food and eating ? N  Using the Toilet? N  In the past six months, have you accidently  leaked urine? N  Do you have problems with loss of bowel control? N  Managing your Medications? N  Managing your Finances? N  Housekeeping or managing your Housekeeping? N  Some recent data might be hidden    Patient Care Team: Ria Bush, MD as PCP - General (Family Medicine) Buford Dresser, MD as PCP - Cardiology (Cardiology) Ladene Artist, MD as Consulting Physician (Gastroenterology) [name], MD as Consulting Physician (General Surgery) Truitt Merle, MD as Consulting Physician (Hematology) Margaretha Seeds, MD as Consulting Physician (Pulmonary Disease)  Indicate any recent Medical Services you may have received from other than [facility] providers in the past year (date may be approximate).     Assessment:   This is a routine wellness examination for Zlatan.  Hearing/Vision screen Hearing Screening - Comments:: No issues  Vision Screening - Comments:: Last exam 2022, Dr. Einar Gip  Dietary issues and exercise activities discussed: Current Exercise Habits: Home exercise routine, Type of exercise: Other - see comments (yard work), Time (Minutes): 60, Frequency (Times/Week): 3, Weekly Exercise (Minutes/Week): 180, Intensity: Moderate   Goals Addressed             This Visit's Progress    Patient Stated       Would like to maintain current routine        Depression Screen PHQ 2/9 Scores 02/17/2021 02/17/2020 01/15/2019 01/10/2018 01/09/2017 01/06/2016  PHQ - 2 Score 0 0 0 0 0 0  PHQ- 9 Score - 0 0 0 0 -    Fall Risk Fall Risk  02/17/2021 02/17/2020 01/19/2019 01/15/2019 01/10/2018  Falls in the past year? 0 0 0 0 0  Number falls in past yr: 0 0 - 0 -  Injury with Fall? 0 0 - 0 -  Risk for fall due to : No Fall Risks No Fall Risks - Medication side effect -  Follow up Falls prevention discussed Falls evaluation completed;Falls prevention discussed - Falls evaluation completed;Falls prevention discussed -    FALL RISK PREVENTION PERTAINING TO THE  HOME:  Any stairs in or around the home? Yes  If so, are there any without handrails? Yes  Home free of loose throw rugs in walkways, pet beds, electrical cords, etc? Yes  Adequate lighting in your home to reduce risk of falls? Yes  ASSISTIVE DEVICES UTILIZED TO PREVENT FALLS:  Life alert? No  Use of a cane, walker or w/c? No  Grab bars in the bathroom? Yes  Shower chair or bench in shower? Yes  Elevated toilet seat or a handicapped toilet? Yes   TIMED UP AND GO:  Was the test performed? No .    Cognitive Function: Normal cognitive status assessed by this Nurse Health Advisor. No abnormalities found.   MMSE - Mini Mental State Exam 02/17/2020 01/15/2019 01/10/2018 01/09/2017  Not completed: Refused - - -  Orientation to time - 5 5 5   Orientation to Place - 5 5 5   Registration - 3 3 3   Attention/ Calculation - 3 0 0  Recall - 3 2 3   Recall-comments - - unable to recall 1 of 3 words -  Language- name 2 objects - - 0 0  Language- repeat - 1 1 1   Language- follow 3 step command - - 3 3  Language- read & follow direction - - 0 0  Write a sentence - - 0 0  Copy design - - 0 0  Total score - - 19 20        Immunizations Immunization History  Administered Date(s) Administered   Fluad Quad(high Dose 65+) 10/30/2018, 02/23/2020   Influenza,inj,Quad PF,6+ Mos 12/05/2016, 12/19/2017   Influenza-Unspecified 02/01/2021   PFIZER(Purple Top)SARS-COV-2 Vaccination 03/16/2019, 04/06/2019, 12/25/2019   Pfizer Covid-19 Vaccine Bivalent Booster 26yrs & up 02/01/2021   Pneumococcal Conjugate-13 01/19/2019   Pneumococcal Polysaccharide-23 02/23/2020    TDAP status: Due, Education has been provided regarding the importance of this vaccine. Advised may receive this vaccine at local pharmacy or Health Dept. Aware to provide a copy of the vaccination record if obtained from local pharmacy or Health Dept. Verbalized acceptance and understanding.  Flu Vaccine status: Up to  date  Pneumococcal vaccine status: Up to date  Covid-19 vaccine status: Completed vaccines  Qualifies for Shingles Vaccine? Yes   Zostavax completed No   Shingrix Completed?: No.    Education has been provided regarding the importance of this vaccine. Patient has been advised to call insurance company to determine out of pocket expense if they have not yet received this vaccine. Advised may also receive vaccine at local pharmacy or Health Dept. Verbalized acceptance and understanding.  Screening Tests Health Maintenance  Topic Date Due   Zoster Vaccines- Shingrix (1 of 2) Never done   TETANUS/TDAP  02/17/2023 (Originally 08/16/1969)   COLONOSCOPY (Pts 45-82yrs Insurance coverage will need to be confirmed)  07/15/2021   Pneumonia Vaccine 96+ Years old  Completed   INFLUENZA VACCINE  Completed   COVID-19 Vaccine  Completed   Hepatitis C Screening  Completed   HPV VACCINES  Aged Out    Health Maintenance  Health Maintenance Due  Topic Date Due   Zoster Vaccines- Shingrix (1 of 2) Never done    Colorectal cancer screening: Type of screening: Colonoscopy. Completed 07/16/19. Repeat every 3 years  Lung Cancer Screening: (Low Dose CT Chest recommended if Age 57-80 years, 30 pack-year currently smoking OR have quit w/in 15years.) does qualify. CT Lung scan completed 04/29/20    Additional Screening:  Hepatitis C Screening: does qualify; Completed 12/30/15  Vision Screening: Recommended annual ophthalmology exams for early detection of glaucoma and other disorders of the eye. Is the patient up to date with their annual eye exam?  Yes  Who is the provider or what is the name of the office in which the patient attends annual  eye exams? Dr. Einar Gip  Dental Screening: Recommended annual dental exams for proper oral hygiene  Community Resource Referral / Chronic Care Management: CRR required this visit?  No   CCM required this visit?  No      Plan:     I have personally  reviewed and noted the following in the patients chart:   Medical and social history Use of alcohol, tobacco or illicit drugs  Current medications and supplements including opioid prescriptions. Patient is not currently taking opioid prescriptions. Functional ability and status Nutritional status Physical activity Advanced directives List of other physicians Hospitalizations, surgeries, and ER visits in previous 12 months Vitals Screenings to include cognitive, depression, and falls Referrals and appointments  In addition, I have reviewed and discussed with patient certain preventive protocols, quality metrics, and best practice recommendations. A written personalized care plan for preventive services as well as general preventive health recommendations were provided to patient.    Due to this being a telephonic visit, the after visit summary with patients personalized plan was offered to patient via mail or my-chart.  Patient would like to access on my-chart.   Loma Messing, LPN   09/17/2669    Nurse Health Advisor  Nurse Notes: none

## 2021-02-17 ENCOUNTER — Other Ambulatory Visit: Payer: Medicare Other

## 2021-02-17 ENCOUNTER — Ambulatory Visit (INDEPENDENT_AMBULATORY_CARE_PROVIDER_SITE_OTHER): Payer: Medicare Other

## 2021-02-17 VITALS — Ht 65.0 in | Wt 116.0 lb

## 2021-02-17 DIAGNOSIS — Z0000 Encounter for general adult medical examination without abnormal findings: Secondary | ICD-10-CM | POA: Diagnosis not present

## 2021-02-17 NOTE — Patient Instructions (Addendum)
Mario Mcdowell , Thank you for taking time to complete your Medicare Wellness Visit. I appreciate your ongoing commitment to your health goals. Please review the following plan we discussed and let me know if I can assist you in the future.   Screening recommendations/referrals: Colonoscopy: up to date , completed 07/16/19, due 07/15/21 Recommended yearly ophthalmology/optometry visit for glaucoma screening and checkup Recommended yearly dental visit for hygiene and checkup  Vaccinations: Influenza vaccine: up to date Pneumococcal vaccine: up to date Tdap vaccine: Due-May obtain vaccine at your local pharmacy. Shingles vaccine: Discuss with your local  pharmacy   Covid-19: up to date  Advanced directives: Please bring a copy of Living Will and/or [place] for your chart.   Conditions/risks identified: see problem list  Next appointment: Follow up in one year for your annual wellness visit. 02/19/22 @ 8:15am, this will be a telephone visit   Preventive Care 47 Years and Older, Male Preventive care refers to lifestyle choices and visits with your health care provider that can promote health and wellness. What does preventive care include? A yearly physical exam. This is also called an annual well check. Dental exams once or twice a year. Routine eye exams. Ask your health care provider how often you should have your eyes checked. Personal lifestyle choices, including: Daily care of your teeth and gums. Regular physical activity. Eating a healthy diet. Avoiding tobacco and drug use. Limiting alcohol use. Practicing safe sex. Taking low doses of aspirin every day. Taking vitamin and mineral supplements as recommended by your health care provider. What happens during an annual well check? The services and screenings done by your health care provider during your annual well check will depend on your age, overall health, lifestyle risk factors, and family history of  disease. Counseling  Your health care provider may ask you questions about your: Alcohol use. Tobacco use. Drug use. Emotional well-being. Home and relationship well-being. Sexual activity. Eating habits. History of falls. Memory and ability to understand (cognition). Work and work Statistician. Screening  You may have the following tests or measurements: Height, weight, and BMI. Blood pressure. Lipid and cholesterol levels. These may be checked every 5 years, or more frequently if you are over 62 years old. Skin check. Lung cancer screening. You may have this screening every year starting at age 50 if you have a 30-pack-year history of smoking and currently smoke or have quit within the past 15 years. Fecal occult blood test (FOBT) of the stool. You may have this test every year starting at age 58. Flexible sigmoidoscopy or colonoscopy. You may have a sigmoidoscopy every 5 years or a colonoscopy every 10 years starting at age 22. Prostate cancer screening. Recommendations will vary depending on your family history and other risks. Hepatitis C blood test. Hepatitis B blood test. Sexually transmitted disease (STD) testing. Diabetes screening. This is done by checking your blood sugar (glucose) after you have not eaten for a while (fasting). You may have this done every 1-3 years. Abdominal aortic aneurysm (AAA) screening. You may need this if you are a current or former smoker. Osteoporosis. You may be screened starting at age 54 if you are at high risk. Talk with your health care provider about your test results, treatment options, and if necessary, the need for more tests. Vaccines  Your health care provider may recommend certain vaccines, such as: Influenza vaccine. This is recommended every year. Tetanus, diphtheria, and acellular pertussis (Tdap, Td) vaccine. You may need a  Td booster every 10 years. Zoster vaccine. You may need this after age 53. Pneumococcal 13-valent  conjugate (PCV13) vaccine. One dose is recommended after age 53. Pneumococcal polysaccharide (PPSV23) vaccine. One dose is recommended after age 85. Talk to your health care provider about which screenings and vaccines you need and how often you need them. This information is not intended to replace advice given to you by your health care provider. Make sure you discuss any questions you have with your health care provider. Document Released: 01/28/2015 Document Revised: 09/21/2015 Document Reviewed: 11/02/2014 Elsevier Interactive Patient Education  2017 [place] Prevention in the Home Falls can cause injuries. They can happen to people of all ages. There are many things you can do to make your home safe and to help prevent falls. What can I do on the outside of my home? Regularly fix the edges of walkways and driveways and fix any cracks. Remove anything that might make you trip as you walk through a door, such as a raised step or threshold. Trim any bushes or trees on the path to your home. Use bright outdoor lighting. Clear any walking paths of anything that might make someone trip, such as rocks or tools. Regularly check to see if handrails are loose or broken. Make sure that both sides of any steps have handrails. Any raised decks and porches should have guardrails on the edges. Have any leaves, snow, or ice cleared regularly. Use sand or salt on walking paths during winter. Clean up any spills in your garage right away. This includes oil or grease spills. What can I do in the bathroom? Use night lights. Install grab bars by the toilet and in the tub and shower. Do not use towel bars as grab bars. Use non-skid mats or decals in the tub or shower. If you need to sit down in the shower, use a plastic, non-slip stool. Keep the floor dry. Clean up any water that spills on the floor as soon as it happens. Remove soap buildup in the tub or shower regularly. Attach bath mats  securely with double-sided non-slip rug tape. Do not have throw rugs and other things on the floor that can make you trip. What can I do in the bedroom? Use night lights. Make sure that you have a light by your bed that is easy to reach. Do not use any sheets or blankets that are too big for your bed. They should not hang down onto the floor. Have a firm chair that has side arms. You can use this for support while you get dressed. Do not have throw rugs and other things on the floor that can make you trip. What can I do in the kitchen? Clean up any spills right away. Avoid walking on wet floors. Keep items that you use a lot in easy-to-reach places. If you need to reach something above you, use a strong step stool that has a grab bar. Keep electrical cords out of the way. Do not use floor polish or wax that makes floors slippery. If you must use wax, use non-skid floor wax. Do not have throw rugs and other things on the floor that can make you trip. What can I do with my stairs? Do not leave any items on the stairs. Make sure that there are handrails on both sides of the stairs and use them. Fix handrails that are broken or loose. Make sure that handrails are as long as the stairways. Check any  carpeting to make sure that it is firmly attached to the stairs. Fix any carpet that is loose or worn. Avoid having throw rugs at the top or bottom of the stairs. If you do have throw rugs, attach them to the floor with carpet tape. Make sure that you have a light switch at the top of the stairs and the bottom of the stairs. If you do not have them, ask someone to add them for you. What else can I do to help prevent falls? Wear shoes that: Do not have high heels. Have rubber bottoms. Are comfortable and fit you well. Are closed at the toe. Do not wear sandals. If you use a stepladder: Make sure that it is fully opened. Do not climb a closed stepladder. Make sure that both sides of the stepladder  are locked into place. Ask someone to hold it for you, if possible. Clearly mark and make sure that you can see: Any grab bars or handrails. First and last steps. Where the edge of each step is. Use tools that help you move around (mobility aids) if they are needed. These include: Canes. Walkers. Scooters. Crutches. Turn on the lights when you go into a dark area. Replace any light bulbs as soon as they burn out. Set up your furniture so you have a clear path. Avoid moving your furniture around. If any of your floors are uneven, fix them. If there are any pets around you, be aware of where they are. Review your medicines with your doctor. Some medicines can make you feel dizzy. This can increase your chance of falling. Ask your doctor what other things that you can do to help prevent falls. This information is not intended to replace advice given to you by your health care provider. Make sure you discuss any questions you have with your health care provider. Document Released: 10/28/2008 Document Revised: 06/09/2015 Document Reviewed: 02/05/2014 Elsevier Interactive Patient Education  2017 [employer].

## 2021-02-24 ENCOUNTER — Ambulatory Visit (INDEPENDENT_AMBULATORY_CARE_PROVIDER_SITE_OTHER): Payer: Medicare Other | Admitting: Family Medicine

## 2021-02-24 ENCOUNTER — Other Ambulatory Visit: Payer: Self-pay

## 2021-02-24 ENCOUNTER — Encounter: Payer: Self-pay | Admitting: Family Medicine

## 2021-02-24 VITALS — BP 108/62 | HR 72 | Temp 97.8°F | Ht 63.25 in | Wt 115.5 lb

## 2021-02-24 DIAGNOSIS — N400 Enlarged prostate without lower urinary tract symptoms: Secondary | ICD-10-CM | POA: Diagnosis not present

## 2021-02-24 DIAGNOSIS — H60542 Acute eczematoid otitis externa, left ear: Secondary | ICD-10-CM | POA: Diagnosis not present

## 2021-02-24 DIAGNOSIS — E7800 Pure hypercholesterolemia, unspecified: Secondary | ICD-10-CM

## 2021-02-24 DIAGNOSIS — J432 Centrilobular emphysema: Secondary | ICD-10-CM

## 2021-02-24 DIAGNOSIS — R7303 Prediabetes: Secondary | ICD-10-CM

## 2021-02-24 DIAGNOSIS — J984 Other disorders of lung: Secondary | ICD-10-CM

## 2021-02-24 DIAGNOSIS — M25511 Pain in right shoulder: Secondary | ICD-10-CM | POA: Diagnosis not present

## 2021-02-24 DIAGNOSIS — I700 Atherosclerosis of aorta: Secondary | ICD-10-CM

## 2021-02-24 DIAGNOSIS — I708 Atherosclerosis of other arteries: Secondary | ICD-10-CM

## 2021-02-24 DIAGNOSIS — G8929 Other chronic pain: Secondary | ICD-10-CM

## 2021-02-24 DIAGNOSIS — Z7189 Other specified counseling: Secondary | ICD-10-CM | POA: Diagnosis not present

## 2021-02-24 DIAGNOSIS — C772 Secondary and unspecified malignant neoplasm of intra-abdominal lymph nodes: Secondary | ICD-10-CM

## 2021-02-24 DIAGNOSIS — C187 Malignant neoplasm of sigmoid colon: Secondary | ICD-10-CM

## 2021-02-24 DIAGNOSIS — Z720 Tobacco use: Secondary | ICD-10-CM

## 2021-02-24 DIAGNOSIS — I6522 Occlusion and stenosis of left carotid artery: Secondary | ICD-10-CM

## 2021-02-24 DIAGNOSIS — Z0001 Encounter for general adult medical examination with abnormal findings: Secondary | ICD-10-CM

## 2021-02-24 MED ORDER — DESONIDE 0.05 % EX CREA: TOPICAL_CREAM | Freq: Two times a day (BID) | CUTANEOUS | 0 refills | Status: DC

## 2021-02-24 MED ORDER — ROSUVASTATIN CALCIUM 5 MG PO TABS: 5.0000 mg | ORAL_TABLET | Freq: Every day | ORAL | 3 refills | Status: DC

## 2021-02-24 NOTE — Patient Instructions (Addendum)
Call Dr Cordelia Pen office to schedule follow up appointment.  Return at your convenience for fasting lab visit.  If interested, check with pharmacy about new 2 shot shingles series (shingrix).  Try desonide cream sent to pharmacy. Let us know if unaffordable or not available.  I think you have right rotator cuff injury - do exercises provided today, ice or heat to shoulder can also help. May use tylenol for discomfort (500mg  with meals or at night time). If not better with above, let me know for evaluation by sports medicine or formal physical therapy referral.  Return as needed or in 1 year for next physical.   Health Maintenance After Age 71 After age 38, you are at a higher risk for certain long-term diseases and infections as well as injuries from falls. Falls are a major cause of broken bones and head injuries in people who are older than age 35. Getting regular preventive care can help to keep you healthy and well. Preventive care includes getting regular testing and making lifestyle changes as recommended by your health care provider. Talk with your health care provider about: Which screenings and tests you should have. A screening is a test that checks for a disease when you have no symptoms. A diet and exercise plan that is right for you. What should I know about screenings and tests to prevent falls? Screening and testing are the best ways to find a health problem early. Early diagnosis and treatment give you the best chance of managing medical conditions that are common after age 17. Certain conditions and lifestyle choices may make you more likely to have a fall. Your health care provider may recommend: Regular vision checks. Poor vision and conditions such as cataracts can make you more likely to have a fall. If you wear glasses, make sure to get your prescription updated if your vision changes. Medicine review. Work with your health care provider to regularly review all of the medicines  you are taking, including over-the-counter medicines. Ask your health care provider about any side effects that may make you more likely to have a fall. Tell your health care provider if any medicines that you take make you feel dizzy or sleepy. Strength and balance checks. Your health care provider may recommend certain tests to check your strength and balance while standing, walking, or changing positions. Foot health exam. Foot pain and numbness, as well as not wearing proper footwear, can make you more likely to have a fall. Screenings, including: Osteoporosis screening. Osteoporosis is a condition that causes the bones to get weaker and break more easily. Blood pressure screening. Blood pressure changes and medicines to control blood pressure can make you feel dizzy. Depression screening. You may be more likely to have a fall if you have a fear of falling, feel depressed, or feel unable to do activities that you used to do. Alcohol use screening. Using too much alcohol can affect your balance and may make you more likely to have a fall. Follow these instructions at home: Lifestyle Do not drink alcohol if: Your health care provider tells you not to drink. If you drink alcohol: Limit how much you have to: 0-1 drink a day for women. 0-2 drinks a day for men. Know how much alcohol is in your drink. In the [place] one drink equals one 12 oz bottle of beer (355 mL), one 5 oz glass of wine (148 mL), or one 1 oz glass of hard liquor (44 mL). Do not use any  products that contain nicotine or tobacco. These products include cigarettes, chewing tobacco, and vaping devices, such as e-cigarettes. If you need help quitting, ask your health care provider. Activity  Follow a regular exercise program to stay fit. This will help you maintain your balance. Ask your health care provider what types of exercise are appropriate for you. If you need a cane or walker, use it as recommended by your health care  provider. Wear supportive shoes that have nonskid soles. Safety  Remove any tripping hazards, such as rugs, cords, and clutter. Install safety equipment such as grab bars in bathrooms and safety rails on stairs. Keep rooms and walkways well-lit. General instructions Talk with your health care provider about your risks for falling. Tell your health care provider if: You fall. Be sure to tell your health care provider about all falls, even ones that seem minor. You feel dizzy, tiredness (fatigue), or off-balance. Take over-the-counter and prescription medicines only as told by your health care provider. These include supplements. Eat a healthy diet and maintain a healthy weight. A healthy diet includes low-fat dairy products, low-fat (lean) meats, and fiber from whole grains, beans, and lots of fruits and vegetables. Stay current with your vaccines. Schedule regular health, dental, and eye exams. Summary Having a healthy lifestyle and getting preventive care can help to protect your health and wellness after age 29. Screening and testing are the best way to find a health problem early and help you avoid having a fall. Early diagnosis and treatment give you the best chance for managing medical conditions that are more common for people who are older than age 33. Falls are a major cause of broken bones and head injuries in people who are older than age 65. Take precautions to prevent a fall at home. Work with your health care provider to learn what changes you can make to improve your health and wellness and to prevent falls. This information is not intended to replace advice given to you by your health care provider. Make sure you discuss any questions you have with your health care provider. Document Revised: 05/23/2020 Document Reviewed: 05/23/2020 Elsevier Patient Education  [place].

## 2021-02-24 NOTE — Assessment & Plan Note (Signed)
Will return fasting for labs including PSA

## 2021-02-24 NOTE — Assessment & Plan Note (Signed)
Appreciate onc, GI care.  Upcoming colonoscopy planned for this summer.

## 2021-02-24 NOTE — Assessment & Plan Note (Signed)
Carotid US x2 normal.  No further imaging indicated.

## 2021-02-24 NOTE — Assessment & Plan Note (Signed)
Exam consistent with R RTC injury. Rec tylenol PRN, ice/heating pad, provided with exercises on RTC injury from [facility] pt advisor. If not better with this, will recommend either sports med eval or formal outpatient PT.

## 2021-02-24 NOTE — Assessment & Plan Note (Signed)
Advanced directive discussion - brought 02/2021, scanned. Daughter Primitivo Gauze and son Ysidro Evert are [employer]. 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 Would be ok with trial for reversible condition and with feeding tube. Wants HCPOA to take precedence over living will.

## 2021-02-24 NOTE — Assessment & Plan Note (Signed)
Chronic, on low dose rosuvastatin. Update FLP when he returns fasting.  The 10-year ASCVD risk score (Arnett DK, et al., 2019) is: 15.7%   Values used to calculate the score:     Age: 71 years     Sex: Male     Is Non-Hispanic African American: No     Diabetic: No     Tobacco smoker: Yes     Systolic Blood Pressure: 240 mmHg     Is BP treated: No     HDL Cholesterol: 39.2 mg/dL     Total Cholesterol: 142 mg/dL

## 2021-02-24 NOTE — Assessment & Plan Note (Addendum)
3/4 ppd. Precontempative, not interested in quitting at this time.

## 2021-02-24 NOTE — Assessment & Plan Note (Signed)
Story/exam most consistent with L external ear canal dermatitis, not consistent with infection. Rx desonide cream with instructions how to use, and topical steroid precautions. Update if unaffordable or not able to fill, would consider mometasone solution.

## 2021-02-24 NOTE — Addendum Note (Signed)
Addended by: Ria Bush on: 02/24/2021 03:20 PM   Modules accepted: Orders

## 2021-02-24 NOTE — Assessment & Plan Note (Addendum)
Will update A1c when he returns for other fasting labs.

## 2021-02-24 NOTE — Assessment & Plan Note (Signed)
Preventative protocols reviewed and updated unless pt declined. Discussed healthy diet and lifestyle.  

## 2021-02-24 NOTE — Assessment & Plan Note (Signed)
Appreciate pulm care.  ?

## 2021-02-24 NOTE — Assessment & Plan Note (Signed)
Stable period off respiratory medication. 

## 2021-02-24 NOTE — Progress Notes (Addendum)
Patient ID: Mario Mcdowell, male    DOB: Nov 05, 1950, 71 y.o.   MRN: [id number]  This visit was conducted in person.  BP 108/62    Pulse 72    Temp 97.8 F (36.6 C) (Temporal)    Ht 5' 3.25" (1.607 m)    Wt 115 lb 8 oz (52.4 kg)    SpO2 95%    BMI 20.30 kg/m   BP Readings from Last 3 Encounters:  02/24/21 108/62  05/03/20 132/78  04/06/20 (!) 151/73    CC: CPE Subjective:   HPI: Mario Mcdowell is a 71 y.o. male presenting on 02/24/2021 for Annual Exam (MCR prt 2. )   Saw health advisor last week for medicare wellness visit. Note reviewed.   No results found.  Flowsheet Row Clinical Support from 02/17/2021 in [place] [facility]  PHQ-2 Total Score 0       Fall Risk  02/17/2021 02/17/2020 01/19/2019 01/15/2019 01/10/2018  Falls in the past year? 0 0 0 0 0  Number falls in past yr: 0 0 - 0 -  Injury with Fall? 0 0 - 0 -  Risk for fall due to : No Fall Risks No Fall Risks - Medication side effect -  Follow up Falls prevention discussed Falls evaluation completed;Falls prevention discussed - Falls evaluation completed;Falls prevention discussed -    Known stage IIIB colon cancer s/p hemicolectomy with FOLFOX in remission. 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 Planned surveillance with Q6 mo CT.  Last saw onc Burr Medico 03/2020.  Continued 1ppd smoker.    Known aortoiliac occlusive disease PAD without significant claudication symptoms although he notes lateral hip pain with ambulation that improves with rest - declined revascularization. Followed by Dr Scot Dock.   Several months of left ear irritation/itch/scaling. No hearing changes, tinnitus.   R shoulder pain ongoing for 3 months, worse with certain positions ie reaching behind back. Pain worse at night time, wakes him up. Denies inciting trauma/injury or fall. Hasn't tried anything for pain.    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  Colonoscopy 07/2019 - rpt 2 yrs [facility] Plan) Prostate cancer screening - PSA reassuring, declines DRE. h/o BPH.  Lung cancer screening - see above, followed by pulmonology for lung masses.  Flu shot -yearly Cobb 03/2019 x2, booster 12/2019, bivalent booster 01/2021 Prevnar-13 01/2019, pneumovax 02/2020 Tetanus shot - declines  Shingles shot - discussed  Advanced directive discussion - brought 02/2021, scanned. Daughter Primitivo Gauze and son Ysidro Evert are [place]. 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 Would be ok with trial for reversible condition and with feeding tube. Wants HCPOA to take precedence over living will. Seat belt use discussed.  Sunscreen use discussed, no changing moles on skin. 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 Drinks 1 gallon milk/day. Fruits/vegetables daily.      Relevant past medical, surgical, family and social history reviewed and  updated as indicated. Interim medical history since our last visit reviewed. Allergies and medications reviewed and updated. Outpatient Medications Prior to Visit  Medication Sig Dispense Refill   aspirin EC 81 MG tablet Take 81 mg by mouth daily.     ibuprofen (ADVIL) 200 MG tablet Take 400 mg by mouth every 6 (six) hours as needed for moderate pain.     rosuvastatin (CRESTOR) 5 MG tablet Take 1 tablet (5 mg total) by mouth daily. 90 tablet 3   tiZANidine (ZANAFLEX) 4 MG tablet TAKE 1  TABLET BY MOUTH TWICE DAILY AS NEEDED FOR MUSCLE SPASM 30 tablet 0   No facility-administered medications prior to visit.     Per HPI unless specifically indicated in ROS section below Review of Systems  Constitutional:  Negative for activity change, appetite change, chills, fatigue, fever and unexpected weight change.  HENT:  Negative for hearing loss.   Eyes:  Negative for visual disturbance.  Respiratory:  Negative for cough, chest tightness, shortness of breath and wheezing.   Cardiovascular:  Negative for chest pain, palpitations and leg swelling.  Gastrointestinal:  Negative for abdominal distention, abdominal pain, blood in stool, constipation, diarrhea, nausea and vomiting.  Genitourinary:  Negative for difficulty urinating and hematuria.  Musculoskeletal:  Negative for arthralgias, myalgias and neck pain.  Skin:  Negative for rash.  Neurological:  Negative for dizziness, seizures, syncope and headaches.  Hematological:  Negative for adenopathy. Does not bruise/bleed easily.  Psychiatric/Behavioral:  Negative for dysphoric mood. The patient is not nervous/anxious.    Objective:  BP 108/62    Pulse 72    Temp 97.8 F (36.6 C) (Temporal)    Ht 5' 3.25" (1.607 m)    Wt 115 lb 8 oz (52.4 kg)    SpO2 95%    BMI 20.30 kg/m   Wt Readings from Last 3 Encounters:  02/24/21 115 lb 8 oz (52.4 kg)  02/17/21 116 lb (52.6 kg)  05/03/20 120 lb (54.4 kg)      Physical Exam Vitals and nursing note reviewed.  Constitutional:      General: He is not in acute distress.    Appearance: Normal appearance. He is well-developed. He is not ill-appearing.  HENT:     Head: Normocephalic and atraumatic.     Right Ear: Hearing, ear canal and external ear normal. There is impacted cerumen.     Left Ear: Hearing and tympanic membrane normal.     Ears:     Comments: Dry scaling skin to external ear canal, inner ear canal unaffected, TM pearly grey with good light reflex Eyes:     General: No scleral  icterus.    Extraocular Movements: Extraocular movements intact.     Conjunctiva/sclera: Conjunctivae normal.     Pupils: Pupils are equal, round, and reactive to light.  Neck:     Thyroid: No thyroid mass or thyromegaly.     Vascular: No carotid bruit.  Cardiovascular:     Rate and Rhythm: Normal rate and regular rhythm.     Pulses: Normal pulses.          Radial pulses are 2+ on the right side and 2+ on the left side.     Heart sounds: Normal heart sounds. No murmur heard. Pulmonary:     Effort: Pulmonary effort is normal. No respiratory distress.     Breath sounds: Normal breath sounds. No wheezing, rhonchi or rales.  Abdominal:     General: Bowel sounds are normal. There is no distension.  Palpations: Abdomen is soft. There is no mass.     Tenderness: There is no abdominal tenderness. There is no guarding or rebound.     Hernia: No hernia is present.  Musculoskeletal:        General: Normal range of motion.     Cervical back: Normal range of motion and neck supple.     Right lower leg: No edema.     Left lower leg: No edema.     Comments:  L shoulder WNL R shoulder exam: No deformity of shoulders on inspection. No pain with palpation of shoulder landmarks. Limited ROM in abduction > forward flexion past 90 degrees due to pain.  + pain and weakness with testing SITS in int rotation. No pain with empty can sign. Neg Speed test. No impingement. No significant pain with rotation of humeral head in [place] joint.   Lymphadenopathy:     Cervical: No cervical adenopathy.  Skin:    General: Skin is warm and dry.     Findings: No rash.  Neurological:     General: No focal deficit present.     Mental Status: He is alert and oriented to person, place, and time.  Psychiatric:        Mood and Affect: Mood normal.        Behavior: Behavior normal.        Thought Content: Thought content normal.        Judgment: Judgment normal.      Results for orders placed or performed in  visit on 03/18/20  CEA (IN HOUSE-[facility])  Result Value Ref Range   CEA ([facility]-In House) 4.26 0.00 - 5.00 ng/mL  CMP (Cancer Center only)  Result Value Ref Range   Sodium 140 135 - 145 mmol/L   Potassium 4.0 3.5 - 5.1 mmol/L   Chloride 105 98 - 111 mmol/L   CO2 25 22 - 32 mmol/L   Glucose, Bld 108 (H) 70 - 99 mg/dL   BUN 15 8 - 23 mg/dL   Creatinine 0.90 0.61 - 1.24 mg/dL   Calcium 9.0 8.9 - 10.3 mg/dL   Total Protein 7.1 6.5 - 8.1 g/dL   Albumin 3.6 3.5 - 5.0 g/dL   AST 18 15 - 41 U/L   ALT 15 0 - 44 U/L   Alkaline Phosphatase 88 38 - 126 U/L   Total Bilirubin 0.2 (L) 0.3 - 1.2 mg/dL   GFR, Estimated >60 >60 mL/min   Anion gap 10 5 - 15  CBC with Differential (Cancer Center Only)  Result Value Ref Range   WBC Count 8.7 4.0 - 10.5 K/uL   RBC 4.76 4.22 - 5.81 MIL/uL   Hemoglobin 14.9 13.0 - 17.0 g/dL   HCT 43.6 39.0 - 52.0 %   MCV 91.6 80.0 - 100.0 fL   MCH 31.3 26.0 - 34.0 pg   MCHC 34.2 30.0 - 36.0 g/dL   RDW 14.5 11.5 - 15.5 %   Platelet Count 176 150 - 400 K/uL   nRBC 0.0 0.0 - 0.2 %   Neutrophils Relative % 60 %   Neutro Abs 5.3 1.7 - 7.7 K/uL   Lymphocytes Relative 29 %   Lymphs Abs 2.5 0.7 - 4.0 K/uL   Monocytes Relative 8 %   Monocytes Absolute 0.7 0.1 - 1.0 K/uL   Eosinophils Relative 2 %   Eosinophils Absolute 0.1 0.0 - 0.5 K/uL   Basophils Relative 1 %   Basophils Absolute 0.1 0.0 - 0.1 K/uL  Immature Granulocytes 0 %   Abs Immature Granulocytes 0.01 0.00 - 0.07 K/uL    Assessment & Plan:  This visit occurred during the SARS-CoV-2 public health emergency.  Safety protocols were in place, including screening questions prior to the visit, additional usage of staff PPE, and extensive cleaning of exam room while observing appropriate contact time as indicated for disinfecting solutions.   Problem List Items Addressed This Visit     Advanced care planning/counseling discussion (Chronic)    Advanced directive discussion - brought 02/2021, scanned. Daughter  Primitivo Gauze and son Ysidro Evert are [place]. 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 Would be ok with trial for reversible condition and with feeding tube. Wants HCPOA to take precedence over living will.      Encounter for general adult medical examination with abnormal findings - Primary (Chronic)    Preventative protocols reviewed and updated unless pt declined. Discussed healthy diet and lifestyle.       Aorto-iliac atherosclerosis (HCC)    Continue aspirin, statin, VVS f/u.       Relevant Medications   rosuvastatin (CRESTOR) 5 MG tablet   Tobacco abuse    3/4 ppd. Precontempative, not interested in quitting at this time.       BPH without obstruction/lower urinary tract symptoms    Will return fasting for labs including PSA      Relevant Orders   PSA   HLD (hyperlipidemia)    Chronic, on low dose rosuvastatin. Update FLP when he returns fasting.  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  Upcoming colonoscopy planned for this summer.       Relevant Orders   CBC with Differential/Platelet   Left carotid stenosis    Carotid US x2 normal.  No further imaging indicated.       Relevant Medications   rosuvastatin (CRESTOR) 5 MG tablet   Prediabetes    Will update A1c when he returns for other fasting labs.       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        Centrilobular emphysema (HCC)    Stable period off respiratory medication.       Right shoulder pain    Exam consistent with R RTC injury. Rec tylenol PRN, ice/heating pad, provided with exercises on RTC injury from [facility] pt advisor. If not better with this, will recommend either sports med eval or formal outpatient PT.       Dermatitis of left ear canal    Story/exam most consistent with L external ear canal dermatitis, not consistent with infection. Rx desonide cream with instructions how to use, and topical steroid precautions. Update if unaffordable  or not able to fill, would consider mometasone solution.         Meds ordered this encounter  Medications   desonide (DESOWEN) 0.05 % cream    Sig: Apply topically 2 (two) times daily. Apply to left external ear, limit use to 10 days at a time.    Dispense:  30 g    Refill:  0   rosuvastatin (CRESTOR) 5 MG tablet    Sig: Take 1 tablet (5 mg total) by mouth daily.    Dispense:  90 tablet    Refill:  3   Orders Placed This Encounter  Procedures   Lipid panel    Standing Status:   Future    Standing Expiration Date:   02/24/2022   Comprehensive metabolic panel    Standing Status:   Future    Standing Expiration Date:   02/24/2022   Hemoglobin A1c    Standing Status:   Future    Standing Expiration Date:   02/24/2022   PSA    Standing Status:   Future    Standing Expiration Date:   02/24/2022   CBC with Differential/Platelet    Standing Status:   Future    Standing Expiration Date:   02/24/2022     Patient instructions: Call Dr Cordelia Pen office to schedule follow up appointment.  Return at your convenience for fasting lab visit.  If interested, check with pharmacy about new 2 shot shingles series (shingrix).  Try desonide cream sent to pharmacy. Let us know if unaffordable or not available.  I think you have right rotator cuff injury - do exercises provided today, ice or heat to shoulder can also help. May use tylenol for  discomfort (500mg  with meals or at night time). If not better with above, let me know for evaluation by sports medicine or formal physical therapy referral.  Return as needed or in 1 year for next physical.   Follow up plan: Return in about 1 year (around 02/24/2022) for annual exam, prior fasting for blood work, medicare wellness visit.  Ria Bush, MD

## 2021-02-24 NOTE — Assessment & Plan Note (Signed)
Continue aspirin, statin, VVS f/u 

## 2021-02-28 ENCOUNTER — Other Ambulatory Visit (INDEPENDENT_AMBULATORY_CARE_PROVIDER_SITE_OTHER): Payer: Medicare Other

## 2021-02-28 ENCOUNTER — Other Ambulatory Visit: Payer: Self-pay

## 2021-02-28 DIAGNOSIS — C187 Malignant neoplasm of sigmoid colon: Secondary | ICD-10-CM | POA: Diagnosis not present

## 2021-02-28 DIAGNOSIS — E7800 Pure hypercholesterolemia, unspecified: Secondary | ICD-10-CM | POA: Diagnosis not present

## 2021-02-28 DIAGNOSIS — C772 Secondary and unspecified malignant neoplasm of intra-abdominal lymph nodes: Secondary | ICD-10-CM | POA: Diagnosis not present

## 2021-02-28 DIAGNOSIS — R7303 Prediabetes: Secondary | ICD-10-CM | POA: Diagnosis not present

## 2021-02-28 DIAGNOSIS — N400 Enlarged prostate without lower urinary tract symptoms: Secondary | ICD-10-CM | POA: Diagnosis not present

## 2021-02-28 LAB — PSA: PSA: 0.45 ng/mL (ref 0.10–4.00)

## 2021-02-28 LAB — CBC WITH DIFFERENTIAL/PLATELET
Basophils Absolute: 0 10*3/uL (ref 0.0–0.1)
Basophils Relative: 0.5 % (ref 0.0–3.0)
Eosinophils Absolute: 0.1 10*3/uL (ref 0.0–0.7)
Eosinophils Relative: 1.2 % (ref 0.0–5.0)
HCT: 47.1 % (ref 39.0–52.0)
Hemoglobin: 15.9 g/dL (ref 13.0–17.0)
Lymphocytes Relative: 50 % — ABNORMAL HIGH (ref 12.0–46.0)
Lymphs Abs: 3.7 10*3/uL (ref 0.7–4.0)
MCHC: 33.8 g/dL (ref 30.0–36.0)
MCV: 94.1 fl (ref 78.0–100.0)
Monocytes Absolute: 0.6 10*3/uL (ref 0.1–1.0)
Monocytes Relative: 8 % (ref 3.0–12.0)
Neutro Abs: 3 10*3/uL (ref 1.4–7.7)
Neutrophils Relative %: 40.3 % — ABNORMAL LOW (ref 43.0–77.0)
Platelets: 165 10*3/uL (ref 150.0–400.0)
RBC: 5.01 Mil/uL (ref 4.22–5.81)
RDW: 14.5 % (ref 11.5–15.5)
WBC: 7.4 10*3/uL (ref 4.0–10.5)

## 2021-02-28 LAB — HEMOGLOBIN A1C: Hgb A1c MFr Bld: 6.3 % (ref 4.6–6.5)

## 2021-03-01 LAB — LIPID PANEL
Cholesterol: 164 mg/dL (ref 0–200)
HDL: 44 mg/dL (ref >39.00)
LDL Cholesterol: 99 mg/dL (ref 0–99)
NonHDL: 119.91
Total CHOL/HDL Ratio: 4
Triglycerides: 104 mg/dL (ref 0.0–149.0)
VLDL: 20.8 mg/dL (ref 0.0–40.0)

## 2021-03-01 LAB — COMPREHENSIVE METABOLIC PANEL
ALT: 15 U/L (ref 0–53)
AST: 18 U/L (ref 0–37)
Albumin: 4.6 g/dL (ref 3.5–5.2)
Alkaline Phosphatase: 85 U/L (ref 39–117)
BUN: 15 mg/dL (ref 6–23)
CO2: 28 mEq/L (ref 19–32)
Calcium: 9.8 mg/dL (ref 8.4–10.5)
Chloride: 102 mEq/L (ref 96–112)
Creatinine, Ser: 0.94 mg/dL (ref 0.40–1.50)
GFR: 82.12 mL/min (ref >60.00)
Glucose, Bld: 92 mg/dL (ref 70–99)
Potassium: 4.4 mEq/L (ref 3.5–5.1)
Sodium: 138 mEq/L (ref 135–145)
Total Bilirubin: 0.3 mg/dL (ref 0.2–1.2)
Total Protein: 7.4 g/dL (ref 6.0–8.3)

## 2021-03-15 ENCOUNTER — Telehealth: Payer: Self-pay | Admitting: Hematology

## 2021-03-15 NOTE — Telephone Encounter (Signed)
Changed upcoming appointment's provider per provider's request. Patient is aware of changes. ?

## 2021-03-17 ENCOUNTER — Other Ambulatory Visit: Payer: Self-pay | Admitting: Physician Assistant

## 2021-03-17 DIAGNOSIS — C187 Malignant neoplasm of sigmoid colon: Secondary | ICD-10-CM

## 2021-03-17 DIAGNOSIS — C772 Secondary and unspecified malignant neoplasm of intra-abdominal lymph nodes: Secondary | ICD-10-CM

## 2021-03-20 ENCOUNTER — Other Ambulatory Visit: Payer: Self-pay

## 2021-03-20 ENCOUNTER — Inpatient Hospital Stay: Payer: Medicare Other

## 2021-03-20 ENCOUNTER — Inpatient Hospital Stay: Payer: Medicare Other | Attending: Hematology

## 2021-03-20 ENCOUNTER — Encounter: Payer: Self-pay | Admitting: Physician Assistant

## 2021-03-20 ENCOUNTER — Inpatient Hospital Stay: Payer: Medicare Other | Admitting: Hematology

## 2021-03-20 ENCOUNTER — Inpatient Hospital Stay: Payer: Medicare Other | Admitting: Physician Assistant

## 2021-03-20 VITALS — BP 128/67 | HR 65 | Temp 97.7°F | Resp 18 | Wt 116.0 lb

## 2021-03-20 DIAGNOSIS — R918 Other nonspecific abnormal finding of lung field: Secondary | ICD-10-CM | POA: Diagnosis not present

## 2021-03-20 DIAGNOSIS — Z79899 Other long term (current) drug therapy: Secondary | ICD-10-CM | POA: Insufficient documentation

## 2021-03-20 DIAGNOSIS — J432 Centrilobular emphysema: Secondary | ICD-10-CM | POA: Insufficient documentation

## 2021-03-20 DIAGNOSIS — I2510 Atherosclerotic heart disease of native coronary artery without angina pectoris: Secondary | ICD-10-CM | POA: Insufficient documentation

## 2021-03-20 DIAGNOSIS — K921 Melena: Secondary | ICD-10-CM | POA: Diagnosis not present

## 2021-03-20 DIAGNOSIS — I700 Atherosclerosis of aorta: Secondary | ICD-10-CM | POA: Insufficient documentation

## 2021-03-20 DIAGNOSIS — C772 Secondary and unspecified malignant neoplasm of intra-abdominal lymph nodes: Secondary | ICD-10-CM

## 2021-03-20 DIAGNOSIS — C187 Malignant neoplasm of sigmoid colon: Secondary | ICD-10-CM | POA: Diagnosis not present

## 2021-03-20 LAB — CBC WITH DIFFERENTIAL (CANCER CENTER ONLY)
Abs Immature Granulocytes: 0.01 10*3/uL (ref 0.00–0.07)
Basophils Absolute: 0.1 10*3/uL (ref 0.0–0.1)
Basophils Relative: 1 %
Eosinophils Absolute: 0.1 10*3/uL (ref 0.0–0.5)
Eosinophils Relative: 1 %
HCT: 41.3 % (ref 39.0–52.0)
Hemoglobin: 14.3 g/dL (ref 13.0–17.0)
Immature Granulocytes: 0 %
Lymphocytes Relative: 41 %
Lymphs Abs: 3 10*3/uL (ref 0.7–4.0)
MCH: 32 pg (ref 26.0–34.0)
MCHC: 34.6 g/dL (ref 30.0–36.0)
MCV: 92.4 fL (ref 80.0–100.0)
Monocytes Absolute: 0.6 10*3/uL (ref 0.1–1.0)
Monocytes Relative: 8 %
Neutro Abs: 3.7 10*3/uL (ref 1.7–7.7)
Neutrophils Relative %: 49 %
Platelet Count: 167 10*3/uL (ref 150–400)
RBC: 4.47 MIL/uL (ref 4.22–5.81)
RDW: 15 % (ref 11.5–15.5)
WBC Count: 7.4 10*3/uL (ref 4.0–10.5)
nRBC: 0 % (ref 0.0–0.2)

## 2021-03-20 LAB — CMP (CANCER CENTER ONLY)
ALT: 11 U/L (ref 0–44)
AST: 16 U/L (ref 15–41)
Albumin: 3.9 g/dL (ref 3.5–5.0)
Alkaline Phosphatase: 75 U/L (ref 38–126)
Anion gap: 4 — ABNORMAL LOW (ref 5–15)
BUN: 13 mg/dL (ref 8–23)
CO2: 28 mmol/L (ref 22–32)
Calcium: 9.2 mg/dL (ref 8.9–10.3)
Chloride: 105 mmol/L (ref 98–111)
Creatinine: 1.12 mg/dL (ref 0.61–1.24)
GFR, Estimated: >60 mL/min (ref >60)
Glucose, Bld: 101 mg/dL — ABNORMAL HIGH (ref 70–99)
Potassium: 4.1 mmol/L (ref 3.5–5.1)
Sodium: 137 mmol/L (ref 135–145)
Total Bilirubin: 0.3 mg/dL (ref 0.3–1.2)
Total Protein: 6.8 g/dL (ref 6.5–8.1)

## 2021-03-20 LAB — CEA (IN HOUSE-CHCC): CEA (CHCC-In House): 3.8 ng/mL (ref 0.00–5.00)

## 2021-03-20 NOTE — Progress Notes (Signed)
[place]   [phone] Fax:(336) [phone]   Clinic Follow up Note   Patient Care Team: Ria Bush, MD as PCP - General (Family Medicine) Buford Dresser, MD as PCP - Cardiology (Cardiology) Ladene Artist, MD as Consulting Physician (Gastroenterology) [name], MD as Consulting Physician (General Surgery) Truitt Merle, MD as Consulting Physician (Hematology) Margaretha Seeds, MD as Consulting Physician (Pulmonary Disease)  Date of Service:  03/20/2021  CHIEF COMPLAINT: F/u of Colorectal cancer, stage III  SUMMARY OF ONCOLOGIC HISTORY: Oncology History Overview Note  Cancer Staging 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 Resected and retrieved. - Rule out malignancy, tumor in the sigmoid colon. Biopsied. Tattooed. - Internal hemorrhoids. - The examination was otherwise normal on direct and retroflexion views.     03/14/2016 Imaging   CT A/P W CONTRAST  IMPRESSION: Short segment area of wall thickening in the distal sigmoid colon, suspicious for site of primary colon carcinoma. Recommend correlation with colonoscopy results.   04/23/2016 Imaging   CT Chest W Contrast IMPRESSION: 1. No evidence of pulmonary metastasis. 2. Bilateral calcified pulmonary nodules and mediastinal lymph nodes consistent with granulomatous disease. 3. Centrilobular emphysema. 4. Coronary artery calcification and aortic atherosclerotic calcification.   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 Colon, segmental resection for tumor, sigmoid - INVASIVE ADENOCARCINOMA, WELL TO MODERATELY DIFFERENTIATED, WITH ABUNDANT MUCIN (PT3 PN1) - METASTATIC ADENOCARCINOMA IN ONE OF TWENTY-TWO LYMPH NODES (1/22) - TWO TUMOR DEPOSITS PRESENT - MARGINS UNINVOLVED BY CARCINOMA - HYPERPLASTIC POLYPS (X2) - SEE ONCOLOGY TABLE BELOW 2. Colon, resection margin (donut), final distal margin - BENIGN COLON - NO MALIGNANCY IDENTIFIED   05/21/2016 Surgery   LAPAROSCOPIC PARTIAL COLECTOMY, SPLENIC FLEXURE MOBILIZATION AND PROCTOSCOPY   06/25/2016 - 09/03/2016 Chemotherapy   FOLFOX every 2 weeks for 3 months    12/03/2016 Imaging   CT AP 12/03/16 IMPRESSION: Status post sigmoid resection.  No evidence of recurrent or metastatic disease.   05/29/2017 Procedure   05/29/2017 Colonoscopy - One 7 mm polyp in the descending colon, removed with a cold snare. Resected and retrieved. - One 4 mm polyp in the transverse colon, removed with a cold biopsy forceps. Resected and retrieved. - Patent end-to-end colo-colonic anastomosis, characterized by healthy appearing mucosa. - Internal hemorrhoids. - The examination was otherwise normal on direct and retroflexion views.   05/29/2017 Pathology Results   05/29/2017 Surgical Pathology Diagnosis 1. Surgical [P], random sites - BENIGN COLONIC MUCOSA. - NO ACTIVE INFLAMMATION OR EVIDENCE OF MICROSCOPIC COLITIS. - NO DYSPLASIA OR MALIGNANCY. 2. Surgical [P], descending and transverse, polyp (2) - HYPERPLASTIC POLYP (X2 FRAGMENTS). - NO DYSPLASIA OR MALIGNANCY.   08/29/2017 Imaging   08/29/2017 CT CAP W Contrast IMPRESSION: 1. No findings of active malignancy. 2. 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 Chart review indicates that the patient was seen for this by Dr. Gae Gallop on 01/02/2017. 3. 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 4. Other imaging  findings  of potential clinical significance: Aortic Atherosclerosis (ICD10-I70.0) and Emphysema (ICD10-J43.9). Coronary atherosclerosis. 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 Small right scrotal hydrocele   09/08/2018 Imaging   CT CAP W Contrsat 09/08/18  IMPRESSION: 1. New areas of consolidated lung in the right upper lobe along with mild right hilar adenopathy. Cause for the consolidation is uncertain and by report the patient is without symptoms/complaints. Presumably this represents pneumonia, follow up chest radiography recommended to ensure clearance and exclude underlying malignancy. 2. 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 Aortic Atherosclerosis (ICD10-I70.0). Coronary atherosclerosis. 3. Other imaging findings of potential clinical significance: Emphysema (ICD10-J43.9). Old granulomatous disease. Borderline prominent common bile duct size at 0.7 cm in diameter.   10/17/2018 Imaging    CT Chest W contrast 10/17/18  IMPRESSION: Perhaps slight improvement in some areas of this right upper lobe process. Scarring from a recent episode of infection is considered. However, little change is seen within the short interval. Occult neoplasm in this area resulting in postobstructive changes should also be considered. Bronchoscopic assessment may be helpful.   No new areas of concern.   Aortic Atherosclerosis (ICD10-I70.0) and Emphysema (ICD10-J43.9).      CURRENT THERAPY:  Surveillance   INTERVAL HISTORY:  Mario Mcdowell returns today for a surveillance visit for history of stage 3 colon cancer. He is unaccompanied for this visit. He reports that he is doing well without any significant imitations.  He has a good appetite and denies any recent weight changes.  He denies any nausea, vomiting or abdominal pain.  His bowel habits are regular without any diarrhea or  constipation.  He denies easy bruising or signs of bleeding.  This includes hematochezia or melena.  He has chronic cough secondary to COPD and smoking.  He denies any fevers, chills, night sweats, shortness of breath or cough.  Has no other complaints.  Rest of the 10 point ROS is below  REVIEW OF SYSTEMS:   Constitutional: Denies fevers, chills or abnormal weight loss Eyes: Denies blurriness of vision Ears, nose, mouth, throat, and face: Denies mucositis or sore throat Respiratory: Denies dyspnea or wheezes (+) Cough from smoking  Cardiovascular: Denies palpitation, chest discomfort or lower extremity swelling Gastrointestinal:  Denies nausea, heartburn or change in bowel habits Skin: Denies abnormal skin rashes Lymphatics: Denies new lymphadenopathy or easy bruising Neurological:Denies numbness, tingling or new weaknesses Behavioral/Psych: Mood is stable, no new changes  All other systems were reviewed with the patient and are negative.  MEDICAL HISTORY:  Past Medical History:  Diagnosis Date   Aorto-iliac atherosclerosis ([place])    by CT; in bilateral legs    Arthritis    Atherosclerotic PVD with intermittent claudication ([facility])    BPH without obstruction/lower urinary tract symptoms 10/25/2015   Centrilobular emphysema ([place])    per chest CT 04-23-2016   Colon adenocarcinoma [facility]) oncologist- dr Burr Medico   dx 05-21-2016 -- Stage IIIB (pT3,pN1,M0) , Invasive sigmoid adenocarcinoma well to moderately differentiated with metastatic intra-abdominal lymph node (1 out of 22), s/p lap. partial colectomy Mario Mcdowell) 05/21/2016 and chemotherapy is planned   Full dentures    History of kidney stones 07/2015   passed without intervention   Pulmonary nodules    per chest CT 04-23-2016 bilateral nodules (consistent w/ granulomatous disease)   Smokers' cough ([place])    Wears contact lenses     SURGICAL HISTORY: Past Surgical History:  Procedure Laterality Date   COLONOSCOPY  03/12/2016   4 polyps,  sigmoid tumor biopsied (TVA w/ MICROSCOPIC FOCUS SUSPICIOUS FOR INVASION) pending surgical resection, rpt 1 yr [facility])   COLONOSCOPY  05/2017   2 benign polyps, rpt 2 yrs [facility])   COLONOSCOPY  07/2019   TAs, rpt 2 yrs [facility])   LAPAROSCOPIC PARTIAL COLECTOMY N/A 05/21/2016   colon cancer, 1/20 positive lymp nodes. LAPAROSCOPIC PARTIAL COLECTOMY, SPLENIC FLEXURE MOBILIZATION AND PROCTOSCOPY;  [name], MD)   MULTIPLE TOOTH EXTRACTIONS  [phone]   PORT-A-CATH REMOVAL Right 10/04/2016   Procedure: REMOVAL PORT-A-CATH;  Surgeon: [name], MD;  [facility];  Service: General;  Laterality: Right;   PORTACATH PLACEMENT N/A 06/21/2016   Procedure: INSERTION PORT-A-CATH;  Surgeon: [name], MD;  Location: [place];  Service: General;  Laterality: N/A;  right   VIDEO BRONCHOSCOPY WITH ENDOBRONCHIAL NAVIGATION N/A 11/14/2018   VIDEO BRONCHOSCOPY WITH ENDOBRONCHIAL NAVIGATION for R lung cavitary lesions;  Icard, Octavio Graves, DO    I have reviewed the social history and family history with the patient and they are unchanged from previous note.  ALLERGIES:  is allergic to penicillins and lipitor [atorvastatin].  MEDICATIONS:  Current Outpatient Medications  Medication Sig Dispense Refill   aspirin EC 81 MG tablet Take 81 mg by mouth daily.     desonide (DESOWEN) 0.05 % cream Apply topically 2 (two) times daily. Apply to left external ear, limit use to 10 days at a time. 30 g 0   ibuprofen (ADVIL) 200 MG tablet Take 400 mg by mouth every 6 (six) hours as needed for moderate pain.     rosuvastatin (CRESTOR) 5 MG tablet Take 1 tablet (5 mg total) by mouth daily. 90 tablet 3   No current facility-administered medications for this visit.    PHYSICAL EXAMINATION: ECOG PERFORMANCE STATUS: 0 - Asymptomatic  Vitals:   03/20/21 1045  BP: 128/67  Pulse: 65  Resp: 18  Temp: 97.7 F (36.5 C)  SpO2: 99%    Filed Weights   03/20/21 1045   Weight: 116 lb (52.6 kg)     GENERAL:alert, no distress and comfortable SKIN: skin color, texture, turgor are normal, no rashes or significant lesions EYES: normal, Conjunctiva are pink and non-injected, sclera clear  NECK: supple, thyroid normal size, non-tender, without nodularity LYMPH:  no palpable lymphadenopathy in the cervical, axillary  LUNGS: clear to auscultation and percussion with normal breathing effort HEART: regular rate & rhythm and no murmurs and no lower extremity edema ABDOMEN:abdomen soft, non-tender and normal bowel sounds Musculoskeletal:no cyanosis of digits and no clubbing  NEURO: alert & oriented x 3 with fluent speech, no focal motor/sensory deficits  LABORATORY DATA:  I have reviewed the data as listed CBC Latest Ref Rng & Units 03/20/2021 02/28/2021 03/18/2020  WBC 4.0 - 10.5 K/uL 7.4 7.4 8.7  Hemoglobin 13.0 - 17.0 g/dL 14.3 15.9 14.9  Hematocrit 39.0 - 52.0 % 41.3 47.1 43.6  Platelets 150 - 400 K/uL 167 165.0 176     CMP Latest Ref Rng & Units 03/20/2021 02/28/2021 03/18/2020  Glucose 70 - 99 mg/dL 101(H) 92 108(H)  BUN 8 - 23 mg/dL _0 Creatinine 0.61 - 1.24 mg/dL 1.12 0.94 0.90  Sodium 135 - 145 mmol/L 137 138 140  Potassium 3.5 - 5.1 mmol/L 4.1 4.4 4.0  Chloride 98 - 111 mmol/L 105 102 105  CO2 22 - 32 mmol/L _1 Calcium 8.9 - 10.3 mg/dL 9.2 9.8 9.0  Total Protein  6.5 - 8.1 g/dL 6.8 7.4 7.1  Total Bilirubin 0.3 - 1.2 mg/dL 0.3 0.3 0.2(L)  Alkaline Phos 38 - 126 U/L 75 85 88  AST 15 - 41 U/L _0 ALT 0 - 44 U/L _1 RADIOGRAPHIC STUDIES: I have personally reviewed the radiological images as listed and agreed with the findings in the report. No results found.   ASSESSMENT & PLAN:  KAYVEON LENNARTZ is a 71 y.o. male with   1. 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 He is s/p hemicolectomy and treated with adjuvant FOLFOX.  He is on  surveillance now. -Last Colonoscopy in 07/2019. Next one due this year in July 2022. We will reach out to [facility] to confirm scheduling of procedure.  -Labs today were reviewed without any intervention needed. CEA is within normal limits. Physical exam unremarkable. There is no concern for cancer recurrence.  -He is almost 5 years since his cancer diagnosis. His risk of recurrence has significantly decreased. No additional CT imaging or follow up is needed.  -Patient will return to our clinic as needed.   2. Right upper lobe lung opacification  -found on routine surveillance CT scan for his colon cancer on September 05, 2018. -He was asymptomatic. He was previously treated with course of doxycycline. -Patient is a heavy smoker, still actively smoking, he is at high risk for lung cancer. -His 11/14/18 Bronchoscopy was negative.  -Most recent CT chest was from 04/2020 that showed stable cavitary areas of consolidation within the right upper lobe, most consistent with sequela of chronic infection or inflammation. There is a new right upper lobe bronchial wall thickening with nodular airspace disease scattered more inferiorly within the right upper lobe, consistent with infection. -He is under the care of pulmonologist Dr Loanne Drilling. Most recent f/u was on 05/03/2020 with recommendations to monitor above findings with serial CT imaging.  -Next CT chest is scheduled for 05/01/2021. Encouraged patient to follow up with Dr. Loanne Drilling after upcoming CT scan.    3. Smoking cessation, Emphysema   -He previously declined Nicotine patches or counseling to help him quit. He has tried to quit, but unsuccessful. I have repeatedly counseled him on smoking cessation.  -He is still smoking 1 pack or less a day. He has stable cough from emphysema.  -He notes he has received his COVID series and booster. He also had flu shot in 2021.    4. BPH -PSA level from 02/28/2021 was normal at 0.45 ng/mL -Will continue to be followed by  his PCP.     5. PAD -Prior CT scan showed complete occlusion of his abdominal aorta. He is reluctant to proceed with surgery. -He is under the care of vascular surgeon Dr. Scot Dock and scheduled for a follow up later this month.    PLAN RTC as needed    No problem-specific Assessment & Plan notes found for this encounter.   No orders of the defined types were placed in this encounter.  Patient expressed understanding and satisfaction with the plan provided.   I have spent a total of 25 minutes minutes of face-to-face and non-face-to-face time, preparing to see the patient, performing a medically appropriate examination, counseling and educating the patient, documenting clinical information in the electronic health record, and care coordination.   Dede Query PA-C Dept of Hematology and [place] at [facility] Phone: [phone]

## 2021-03-21 ENCOUNTER — Other Ambulatory Visit: Payer: Self-pay

## 2021-03-21 DIAGNOSIS — I739 Peripheral vascular disease, unspecified: Secondary | ICD-10-CM

## 2021-04-06 ENCOUNTER — Encounter: Payer: Self-pay | Admitting: Vascular Surgery

## 2021-04-06 ENCOUNTER — Other Ambulatory Visit: Payer: Self-pay

## 2021-04-06 ENCOUNTER — Ambulatory Visit: Payer: Medicare Other | Admitting: Vascular Surgery

## 2021-04-06 ENCOUNTER — Ambulatory Visit (HOSPITAL_COMMUNITY)
Admission: RE | Admit: 2021-04-06 | Discharge: 2021-04-06 | Disposition: A | Discharge status: Home / Self Care | Payer: Medicare Other | Source: Ambulatory Visit | Attending: Vascular Surgery | Admitting: Vascular Surgery

## 2021-04-06 VITALS — BP 119/70 | HR 62 | Temp 98.3°F | Resp 20 | Ht 63.25 in | Wt 115.0 lb

## 2021-04-06 DIAGNOSIS — I739 Peripheral vascular disease, unspecified: Secondary | ICD-10-CM

## 2021-04-06 NOTE — Progress Notes (Signed)
? ? ?REASON FOR VISIT:  ? ?Follow-up of peripheral arterial disease. ? ?MEDICAL ISSUES:  ? ?AORTOILIAC OCCLUSIVE DISEASE: This patient has a known aortic occlusion.  He has stable buttock claudication.  I encouraged him to stay as active as possible.  We have again discussed the importance of tobacco cessation.  We would only consider aortofemoral bypass grafting if he developed disabling claudication, rest pain, or nonhealing ulcer.  I ordered follow-up ABIs in 1 year and I will see him back at that time.  He knows to call sooner if he has problems. ? ?BILATERAL CAROTID BRUITS: The patient does have a history of bilateral carotid bruits.  He has had multiple duplex scans that have been normal, most recently 04/06/2020.  However when he comes in next year for his routine follow-up study I think I will get another duplex scan as it will been 2 years at that point.  He is asymptomatic.  He is on aspirin and is on a statin. ? ?HPI:  ? ?Mario Mcdowell is a pleasant 71 y.o. male who I last saw on 04/06/2020.  He has a known aortic occlusion.  In addition I been following a moderate 40 to 59% left carotid stenosis.  However ultimately his carotid duplex scan have been normal for several years and therefore routine follow-up was discontinued.  The patient had minimal symptoms related to his aortic occlusion.  We discussed tobacco cessation and I encouraged him to continue walking.  I ordered follow-up ABIs in 1 year.  He comes in for a 1 year follow-up study. ? ?Since I saw him last he continues to have stable buttock claudication bilaterally.  His symptoms are brought on by ambulation and relieved with rest.  He denies any thigh or calf claudication.  He denies any history of rest pain.  He has had no nonhealing wounds.  He does have calluses on his feet but I told him not to have surgery given his PAD. ? ?He does continue to smoke three quarters of a pack per day of cigarettes.  He is on aspirin and is on a  statin. ? ? ?Past Medical History:  ?Diagnosis Date  ? Aorto-iliac atherosclerosis ([place])   ? by CT; in bilateral legs   ? Arthritis   ? Atherosclerotic PVD with intermittent claudication ([place])   ? BPH without obstruction/lower urinary tract symptoms 10/25/2015  ? Centrilobular emphysema ([place])   ? per chest CT 04-23-2016  ? Colon adenocarcinoma [facility]) oncologist- dr Burr Medico  ? 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 partial colectomy [name]) 05/21/2016 and chemotherapy is planned  ? Full dentures   ? History of kidney stones 07/2015  ? passed without intervention  ? Pulmonary nodules   ? per chest CT 04-23-2016 bilateral nodules (consistent w/ granulomatous disease)  ? Smokers' cough ([place])   ? Wears contact lenses   ? ? ?Family History  ?Problem Relation Age of Onset  ? Cancer Paternal Aunt   ?     unsure details  ? Diabetes Cousin   ? Cancer Paternal Uncle   ?     unknow type cancer   ? Prostate cancer Neg Hx   ? Kidney cancer Neg Hx   ? CAD Neg Hx   ? Stroke Neg Hx   ? Colon cancer Neg Hx   ? Esophageal cancer Neg Hx   ? Stomach cancer Neg  Hx   ? Rectal cancer Neg Hx   ? ? ?SOCIAL HISTORY: ?Social History  ? ?Tobacco Use  ? Smoking status: Every Day  ?  Packs/day: 1.00  ?  Years: 46.00  ?  Pack years: 46.00  ?  Types: Cigarettes  ? Smokeless tobacco: Never  ? Tobacco comments:  ?  3/4 pack daily  ?Substance Use Topics  ? Alcohol use: No  ?  Comment: quit 2000  ? ? ?Allergies  ?Allergen Reactions  ? Penicillins Swelling  ?  Did it involve swelling of the face/tongue/throat, SOB, or low BP? Yes ?Did it involve sudden or severe rash/hives, skin peeling, or any reaction on the inside of your mouth or nose? No ?Did you need to seek medical attention at a hospital or doctor's office? No ?When did it last happen?      childhood allergy ?If all above answers are ?NO?, may proceed with  cephalosporin use. ? ?  ? Lipitor [Atorvastatin] Nausea Only  ? ? ?Current Outpatient Medications  ?Medication Sig Dispense Refill  ? aspirin EC 81 MG tablet Take 81 mg by mouth daily.    ? desonide (DESOWEN) 0.05 % cream Apply topically 2 (two) times daily. Apply to left external ear, limit use to 10 days at a time. 30 g 0  ? ibuprofen (ADVIL) 200 MG tablet Take 400 mg by mouth every 6 (six) hours as needed for moderate pain.    ? rosuvastatin (CRESTOR) 5 MG tablet Take 1 tablet (5 mg total) by mouth daily. 90 tablet 3  ? ?No current facility-administered medications for this visit.  ? ? ?REVIEW OF SYSTEMS:  ?'[X]'$  denotes positive finding, '[ ]'$  denotes negative finding ?Cardiac  Comments:  ?Chest pain or chest pressure:    ?Shortness of breath upon exertion:    ?Short of breath when lying flat:    ?Irregular heart rhythm:    ?    ?Vascular    ?Pain in calf, thigh, or hip brought on by ambulation: x   ?Pain in feet at night that wakes you up from your sleep:     ?Blood clot in your veins:    ?Leg swelling:     ?    ?Pulmonary    ?Oxygen at home:    ?Productive cough:     ?Wheezing:     ?    ?Neurologic    ?Sudden weakness in arms or legs:     ?Sudden numbness in arms or legs:     ?Sudden onset of difficulty speaking or slurred speech:    ?Temporary loss of vision in one eye:     ?Problems with dizziness:     ?    ?Gastrointestinal    ?Blood in stool:     ?Vomited blood:     ?    ?Genitourinary    ?Burning when urinating:     ?Blood in urine:    ?    ?Psychiatric    ?Major depression:     ?    ?Hematologic    ?Bleeding problems:    ?Problems with blood clotting too easily:    ?    ?Skin    ?Rashes or ulcers:    ?    ?Constitutional    ?Fever or chills:    ? ?PHYSICAL EXAM:  ? ?Vitals:  ? 04/06/21 1322  ?BP: 119/70  ?Pulse: 62  ?Resp: 20  ?Temp: 98.3 ?F (36.8 ?C)  ?SpO2: 96%  ?Weight: 115 lb (52.2 kg)  ?  Height: 5' 3.25" (1.607 m)  ? ? ?GENERAL: The patient is a well-nourished male, in no acute distress. The vital signs  are documented above. ?CARDIAC: There is a regular rate and rhythm.  ?VASCULAR: He has bilateral carotid bruits. ?I cannot palpate femoral pulses or pedal pulses. ?He has no significant lower extremity swelling. ?PULMONARY: There is good air exchange bilaterally without wheezing or rales. ?ABDOMEN: Soft and non-tender with normal pitched bowel sounds.  ?MUSCULOSKELETAL: There are no major deformities or cyanosis. ?NEUROLOGIC: No focal weakness or paresthesias are detected. ?SKIN: There are no ulcers or rashes noted. ?PSYCHIATRIC: The patient has a normal affect. ? ?DATA:   ? ?ARTERIAL DOPPLER STUDY: I have independently interpreted his arterial Doppler study today. ? ?On the right side there is a biphasic posterior tibial signal with a monophasic dorsalis pedis signal.  ABIs 59%.  Toe pressures 56 mmHg. ? ?On the left side there is a monophasic dorsalis pedis and posterior tibial signal.  ABIs 44%.  Toe pressures 29 mmHg. ? ?Deitra Mayo ?[facility]  ?Office [phone] ?

## 2021-04-17 ENCOUNTER — Ambulatory Visit: Payer: Medicare Other | Admitting: Family Medicine

## 2021-05-01 ENCOUNTER — Ambulatory Visit (INDEPENDENT_AMBULATORY_CARE_PROVIDER_SITE_OTHER)
Admission: RE | Admit: 2021-05-01 | Discharge: 2021-05-01 | Disposition: A | Discharge status: Home / Self Care | Payer: Medicare Other | Source: Ambulatory Visit | Attending: Pulmonary Disease | Admitting: Pulmonary Disease

## 2021-05-01 DIAGNOSIS — J9859 Other diseases of mediastinum, not elsewhere classified: Secondary | ICD-10-CM | POA: Diagnosis not present

## 2021-05-01 DIAGNOSIS — J8410 Pulmonary fibrosis, unspecified: Secondary | ICD-10-CM | POA: Diagnosis not present

## 2021-05-01 DIAGNOSIS — J432 Centrilobular emphysema: Secondary | ICD-10-CM | POA: Diagnosis not present

## 2021-05-01 DIAGNOSIS — J984 Other disorders of lung: Secondary | ICD-10-CM | POA: Diagnosis not present

## 2021-05-01 DIAGNOSIS — I2510 Atherosclerotic heart disease of native coronary artery without angina pectoris: Secondary | ICD-10-CM | POA: Diagnosis not present

## 2021-05-08 ENCOUNTER — Encounter: Payer: Self-pay | Admitting: Pulmonary Disease

## 2021-05-08 ENCOUNTER — Ambulatory Visit: Payer: Medicare Other | Admitting: Pulmonary Disease

## 2021-05-08 VITALS — BP 156/68 | HR 77 | Ht 63.25 in | Wt 118.4 lb

## 2021-05-08 DIAGNOSIS — J984 Other disorders of lung: Secondary | ICD-10-CM

## 2021-05-08 DIAGNOSIS — Z720 Tobacco use: Secondary | ICD-10-CM | POA: Diagnosis not present

## 2021-05-08 NOTE — Patient Instructions (Signed)
?  Granulomatous Lung ?Cavitary lesions - stable ?Interval development of nodular opacities concerning for infectious etiology however in the absence of any symptoms I would not treat this.  ?--REFER to lung screening however if not a candidate will need CT Chest without contrast ?--OK to cancel CT chest once enrolled in program ?--Plan for CT Chest in one year ?--If you develop respiratory symptoms before this, please contact our office and we can schedule imaging sooner ? ?Follow-up with me in 1 year ?

## 2021-05-08 NOTE — Progress Notes (Signed)
? ? ?Subjective:  ? ?PATIENT ID: Mario Mcdowell GENDER: male DOB: 01/16/1950, MRN: [id number] ? ? ?HPI ? ?Chief Complaint  ?Patient presents with  ? Follow-up  ?  CT results, already seen on MyChart states he has a kidney stone  ? ? ?Reason for Visit: Follow-up CT ? ?[name] is a 71 year old male current smoker with stage IIIB colon cancer s/p hemicolectomy s/p FOLFOX in remission who presents for follow-up for abnormal CT. ? ?Synopsis: He underwent navigational bronchoscopy on 11/14/18. Results with no evidence of cancer. Focal non-caseating granuloma in the right upper lobe under surveillance ? ?05/08/21 ?Denies shortness of breath, cough or wheezing. Continue to active smoker 1 ppd. He remains active at home. Able to perform heavy duty yardwork including digging and planting. Reports recent kidney stones with associated lower back pain and abdominal pain. Denies fevers, chills, cough, night sweats or unintentional weight loss. Not on any maintenance inhalers. ? ?Social History: ?Active smoker. 1 ppd ? 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 ?Past Medical History:  ?Diagnosis Date  ? Aorto-iliac atherosclerosis ([place])   ? by CT; in bilateral legs   ? Arthritis   ? Atherosclerotic PVD with intermittent claudication ([place])   ? BPH without obstruction/lower urinary tract symptoms 10/25/2015  ? Centrilobular emphysema ([place])   ? per chest CT 04-23-2016  ? Colon adenocarcinoma [facility]) oncologist- dr Burr Medico  ? 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 partial colectomy Mario Mcdowell) 05/21/2016 and chemotherapy is planned  ? Full dentures   ? History of kidney stones 07/2015  ? passed without intervention  ? Pulmonary nodules   ? per chest CT 04-23-2016 bilateral nodules (consistent w/ granulomatous disease)  ? Smokers'  cough ([place])   ? Wears contact lenses   ?  ?Allergies  ?Allergen Reactions  ? Penicillins Swelling  ?  Did it involve swelling of the face/tongue/throat, SOB, or low BP? Yes ?Did it involve sudden or severe rash/hives, skin peeling, or any reaction on the inside of your mouth or nose? No ?Did you need to seek medical attention at a hospital or doctor's office? No ?When did it last happen?      childhood allergy ?If all above answers are ?NO?, may proceed with cephalosporin use. ? ?  ? Lipitor [Atorvastatin] Nausea Only  ?  ? ?Outpatient Medications Prior to Visit  ?Medication Sig Dispense Refill  ? aspirin EC 81 MG tablet Take 81 mg by mouth daily.    ? desonide (DESOWEN) 0.05 % cream Apply topically 2 (two) times daily. Apply to left external ear, limit use to 10 days at a time. 30 g 0  ? ibuprofen (ADVIL) 200 MG tablet Take 400 mg by mouth every 6 (six) hours as needed for moderate pain.    ? rosuvastatin (CRESTOR) 5 MG tablet Take 1 tablet (5 mg total) by mouth daily. 90 tablet 3  ? ?No facility-administered medications prior to visit.  ? ? ?Review of Systems  ?Constitutional:  Negative for chills, diaphoresis, fever, malaise/fatigue and weight loss.  ?HENT:  Negative for congestion.   ?Respiratory:  Negative for cough, hemoptysis, sputum production, shortness of breath and wheezing.   ?Cardiovascular:  Negative for chest pain, palpitations and leg swelling.  ? ? ?Objective:  ? ?Vitals:  ? 05/08/21 0901  ?BP: (!) 156/68  ?Pulse: 77  ?  SpO2: 98%  ?Weight: 118 lb 6.4 oz (53.7 kg)  ?Height: 5' 3.25" (1.607 m)  ? ?SpO2: 98 % ?O2 Device: None (Room air) ? ?Physical Exam: ?General: Well-appearing, no acute distress ?HENT: [place], AT ?Eyes: EOMI, no scleral icterus ?Neuro: AAO x4, CNII-XII grossly intact ?Psych: Normal mood, normal affect ? ? ?Data Reviewed: ? ?Imaging: ?CT Chest 10/17/18 - 5x4cm and 3x2cm lung masses, slightly improved compared to prior imaging. Background emphysema. ?CT Chest 02/21/19 - Improved but persistent  RUL consolidation. Background emphysema unchanged ?CT Chest 10/07/19 - Similar posterior RUL cavitary lesions including 4x4cm. Anterior RUL 2.7 x 1cm lesion with increased cavitation.  ?CT Chest 04/29/20 - unchanged cavitary lesions with interval nodular airspace disease in the right upper lobe. Emphysema. ?CT Chest 05/08/21 - Similar 4.4 x 3.8 cm cavitary lesions in right upper lobe and unchanged scattered nodular densities in the right upper and middle lobes. Decreased RML sobplueral nodule to 9 x9 mm. Centrilobular emphysema.  ? ?PFT: ?None on file ? ?Assessment & Plan:  ? ?Discussion: ?71 year old male active smoker with hx adenocarcinoma of the sigmoid colon s/p hemicolectomy and FOLFOX, now in remission. Initially referred to me for RUL cavitary masses s/p navigational bronch on 11/14/18 which demonstrated focal non-caseating granuloma, no malignancy seen. He has been under surveillance imaging. Reviewed CT 05/08/21 with unchanged cavitary lesions and scattered nodular densities in the right lung. Also entertaining that this may be secondary to burnt out sarcoid. No indication for treatment since he is asymptomatic ? ?Granulomatous Lung ?Cavitary lesions - stable ?Interval development of nodular opacities concerning for infectious etiology however in the absence of any symptoms I would not treat this.  ?--REFER to lung screening however if not a candidate will need CT Chest without contrast ?--OK to cancel CT chest once enrolled in program ?--Plan for CT Chest in one year ?--If you develop respiratory symptoms before this, please contact our office and we can schedule imaging sooner ? ?Emphysema ?--No indication for bronchodilators. ? ?Tobacco abuse ?Patient is an active smoker. Not interested in quitting ?We discussed smoking cessation for <3 minutes. We discussed triggers and stressors and ways to deal with them. We discussed barriers to continued smoking and benefits of smoking cessation. Provided patient with  information cessation techniques and interventions ? ?History of kidney stones ?--Advised increasing hydration and reducing caffeine intake ?--Continue follow-up with PCP and Urology for management ? ? ?Health Maintenance ?Immunization History  ?Administered Date(s) Administered  ? Fluad Quad(high Dose 65+) 10/30/2018, 02/23/2020  ? Influenza, High Dose Seasonal PF 02/01/2021  ? Influenza,inj,Quad PF,6+ Mos 12/05/2016, 12/19/2017  ? Influenza-Unspecified 02/01/2021  ? PFIZER(Purple Top)SARS-COV-2 Vaccination 03/16/2019, 04/06/2019, 12/25/2019  ? Pension scheme manager 36yr & up 02/01/2021  ? Pneumococcal Conjugate-13 01/19/2019  ? Pneumococcal Polysaccharide-23 02/23/2020  ? ?Orders Placed This Encounter  ?Procedures  ? CT Chest Wo Contrast  ?  Standing Status:   Future  ?  Standing Expiration Date:   05/09/2022  ?  Order Specific Question:   Preferred imaging location?  ?  Answer:   [id number]  ?  Comments:   [place] ? Ambulatory Referral for Lung Cancer Scre  ?  Referral Priority:   Routine  ?  Referral Type:   Consultation  ?  Referral Reason:   Specialty Services Required  ?  Number of Visits Requested:   1  ? ?No orders of the defined types were placed in this encounter. ? ?Return  in about 1 year (around 05/09/2022).  ? ?I have spent a total time of 30-minutes on the day of the appointment reviewing prior documentation, coordinating care and discussing medical diagnosis and plan with the patient/family. Past medical history, allergies, medications were reviewed. Pertinent imaging, labs and tests included in this note have been reviewed and interpreted independently by me. ? ?Jamia Hoban Rodman Pickle, MD ?[facility] ?05/08/2021 9:33 AM  ?Office Number [phone] ? ?  ?

## 2021-05-15 NOTE — Progress Notes (Addendum)
? ?05/16/2021 ?12:37 PM  ? ?Mario Mcdowell ?1950/06/02 ?[id number] ? ?Referring provider:  ?Ria Bush, MD ?[address] ?[place],  [place] [address] ?Chief Complaint  ?Patient presents with  ? Nephrolithiasis  ? ? ?HPI: ?Mario Mcdowell is a 71 y.o.male who presents today for further evaluation of kidney stones.  ? ?He underwent a chest CT on 05/01/2021 and visualized a 5 mm stone in the upper pole of the right kidney.  This is incidental and he is not having an pain currently.   ? ?He had pain in his mid lower back last week in the midline but "has a bad back." ? ?He passed stone several years ago with an ER visit.  His pain resolved before leaving the emergency room and he required no further intervention.  He recalls this is being a very traumatic and painful experience in the absolutely does not want to have this happen again. ? ?He does have an underlying diagnosis of BPH but has no significant urinary symptoms today. ? ? ? ?PMH: ?Past Medical History:  ?Diagnosis Date  ? Aorto-iliac atherosclerosis ([place])   ? by CT; in bilateral legs   ? Arthritis   ? Atherosclerotic PVD with intermittent claudication ([place])   ? BPH without obstruction/lower urinary tract symptoms 10/25/2015  ? Centrilobular emphysema ([place])   ? per chest CT 04-23-2016  ? Colon adenocarcinoma [facility]) oncologist- dr Burr Medico  ? 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 partial colectomy [name]) 05/21/2016 and chemotherapy is planned  ? Full dentures   ? History of kidney stones 07/2015  ? passed without intervention  ? Pulmonary nodules   ? per chest CT 04-23-2016 bilateral nodules (consistent w/ granulomatous disease)  ? Smokers' cough ([place])   ? Wears contact lenses   ? ? ?Surgical History: ?Past Surgical History:  ?Procedure Laterality Date  ? COLONOSCOPY  03/12/2016  ? 4 polyps, sigmoid tumor biopsied (TVA w/  MICROSCOPIC FOCUS SUSPICIOUS FOR INVASION) pending surgical resection, rpt 1 yr [facility] Plan)  ? COLONOSCOPY  05/2017  ? 2 benign polyps, rpt 2 yrs Fuller Plan)  ? COLONOSCOPY  07/2019  ? TAs, rpt 2 yrs Fuller Plan)  ? LAPAROSCOPIC PARTIAL COLECTOMY N/A 05/21/2016  ? colon cancer, 1/20 positive lymp nodes. LAPAROSCOPIC PARTIAL COLECTOMY, SPLENIC FLEXURE MOBILIZATION AND PROCTOSCOPY;  [name], MD)  ? MULTIPLE TOOTH EXTRACTIONS  1980's  ? PORT-A-CATH REMOVAL Right 10/04/2016  ? Procedure: REMOVAL PORT-A-CATH;  Surgeon: [name], MD;  [facility];  Service: General;  Laterality: Right;  ? PORTACATH PLACEMENT N/A 06/21/2016  ? Procedure: INSERTION PORT-A-CATH;  Surgeon: [name], MD;  Location: [facility];  Service: General;  Laterality: N/A;  right  ? VIDEO BRONCHOSCOPY WITH ENDOBRONCHIAL NAVIGATION N/A 11/14/2018  ? VIDEO BRONCHOSCOPY WITH ENDOBRONCHIAL NAVIGATION for R lung cavitary lesions;  Icard, Bradley L, DO  ? ? ?Home Medications:  ?Allergies as of 05/16/2021   ? ?   Reactions  ? Penicillins Swelling  ? Did it involve swelling of the face/tongue/throat, SOB, or low BP? Yes ?Did it involve sudden or severe rash/hives, skin peeling, or any reaction on the inside of your mouth or nose? No ?Did you need to seek medical attention at a hospital or doctor's office? No ?When did it last happen?      childhood allergy ?If all above answers are ?NO?, may proceed with cephalosporin use.  ?  Lipitor [atorvastatin] Nausea Only  ? ?  ? ?  ?Medication List  ?  ? ?  ? Accurate as of May 16, 2021 11:59 PM. If you have any questions, ask your nurse or doctor.  ?  ?  ? ?  ? ?STOP taking these medications   ? ?desonide 0.05 % cream ?Commonly known as: DESOWEN ?Stopped by: Hollice Espy, MD ?  ? ?  ? ?TAKE these medications   ? ?aspirin EC 81 MG tablet ?Take 81 mg by mouth daily. ?  ?ibuprofen 200 MG tablet ?Commonly known as: ADVIL ?Take 400 mg by mouth every 6 (six) hours as needed for  moderate pain. ?  ?rosuvastatin 5 MG tablet ?Commonly known as: CRESTOR ?Take 1 tablet (5 mg total) by mouth daily. ?  ? ?  ? ? ?Allergies:  ?Allergies  ?Allergen Reactions  ? Penicillins Swelling  ?  Did it involve swelling of the face/tongue/throat, SOB, or low BP? Yes ?Did it involve sudden or severe rash/hives, skin peeling, or any reaction on the inside of your mouth or nose? No ?Did you need to seek medical attention at a hospital or doctor's office? No ?When did it last happen?      childhood allergy ?If all above answers are ?NO?, may proceed with cephalosporin use. ? ?  ? Lipitor [Atorvastatin] Nausea Only  ? ? ?Family History: ?Family History  ?Problem Relation Age of Onset  ? Cancer Paternal Aunt   ?     unsure details  ? Diabetes Cousin   ? Cancer Paternal Uncle   ?     unknow type cancer   ? Prostate cancer Neg Hx   ? Kidney cancer Neg Hx   ? CAD Neg Hx   ? Stroke Neg Hx   ? Colon cancer Neg Hx   ? Esophageal cancer Neg Hx   ? Stomach cancer Neg Hx   ? Rectal cancer Neg Hx   ? ? ?Social History:  reports that he has been smoking cigarettes. He has a 46.00 pack-year smoking history. He has never used smokeless tobacco. He reports that he does not drink alcohol and does not use drugs. ? ? ?Physical Exam: ?BP 140/80   Pulse 76   Ht '5\' 3"'$  (1.6 m)   Wt 118 lb (53.5 kg)   BMI 20.90 kg/m?   ?Constitutional:  Alert and oriented, No acute distress. ?HEENT: [place] AT, moist mucus membranes.  Trachea midline, no masses. ?Cardiovascular: No clubbing, cyanosis, or edema. ?Respiratory: Normal respiratory effort, no increased work of breathing. ?Skin: No rashes, bruises or suspicious lesions. ?Neurologic: Grossly intact, no focal deficits, moving all 4 extremities. ?Psychiatric: Normal mood and affect. ? ?Laboratory Data: ?Lab Results  ?Component Value Date  ? CREATININE 1.12 03/20/2021  ? ?Lab Results  ?Component Value Date  ? HGBA1C 6.3 02/28/2021  ? ? ?Urinalysis ?Results for orders placed or performed in visit on  05/16/21  ?Microscopic Examination  ? Urine  ?Result Value Ref Range  ? WBC, UA 0-5 0 - 5 /hpf  ? RBC 0-2 0 - 2 /hpf  ? Epithelial Cells (non renal) 0-10 0 - 10 /hpf  ? Bacteria, UA None seen None seen/Few  ?Urinalysis, Complete  ?Result Value Ref Range  ? Specific Gravity, UA >1.030 (H) 1.005 - 1.030  ? pH, UA 6.5 5.0 - 7.5  ? Color, UA Yellow Yellow  ? Appearance Ur Clear Clear  ? Leukocytes,UA Negative Negative  ? Protein,UA Negative Negative/Trace  ? Glucose,  UA Negative Negative  ? Ketones, UA Trace (A) Negative  ? RBC, UA Negative Negative  ? Bilirubin, UA Negative Negative  ? Urobilinogen, Ur 1.0 0.2 - 1.0 mg/dL  ? Nitrite, UA Negative Negative  ? Microscopic Examination See below:   ? ? ? ?Pertinent Imaging: ?CLINICAL DATA:  Cavitary pulmonary lesion. ?  ?EXAM: ?CT CHEST WITHOUT CONTRAST ?  ?TECHNIQUE: ?Multidetector CT imaging of the chest was performed following the ?standard protocol without IV contrast. ?  ?RADIATION DOSE REDUCTION: This exam was performed according to the ?departmental dose-optimization program which includes automated ?exposure control, adjustment of the mA and/or kV according to ?patient size and/or use of iterative reconstruction technique. ?  ?COMPARISON:  Chest CT dated 04/29/2020. ?  ?FINDINGS: ?Evaluation of this exam is limited in the absence of intravenous ?contrast. ?  ?Cardiovascular: There is no cardiomegaly or pericardial effusion. ?There is coronary vascular calcification. Mild atherosclerotic ?calcification of the thoracic aorta. No aneurysmal dilatation. The ?central pulmonary arteries are grossly unremarkable on this ?noncontrast CT. ?  ?Mediastinum/Nodes: No hilar or mediastinal adenopathy. Calcified ?hilar and mediastinal granuloma. The esophagus is grossly ?unremarkable. No mediastinal fluid collection. ?  ?Lungs/Pleura: Background of centrilobular emphysema. No significant ?interval change in the right upper lobe cavitary lesions compared to ?the prior CT with the  larger component measuring approximately 4.4 x ?3.8 cm (previously 4.9 x 3.9 cm). Similar appearance of scattered ?nodular density in the right upper and right middle lobes. There has ?been interval decrease i

## 2021-05-16 ENCOUNTER — Ambulatory Visit: Payer: Medicare Other | Admitting: Urology

## 2021-05-16 ENCOUNTER — Encounter: Payer: Self-pay | Admitting: Urology

## 2021-05-16 ENCOUNTER — Ambulatory Visit
Admission: RE | Admit: 2021-05-16 | Discharge: 2021-05-16 | Disposition: A | Discharge status: Home / Self Care | Payer: Medicare Other | Source: Ambulatory Visit | Attending: Urology | Admitting: Urology

## 2021-05-16 VITALS — BP 140/80 | HR 76 | Ht 63.0 in | Wt 118.0 lb

## 2021-05-16 DIAGNOSIS — Z87442 Personal history of urinary calculi: Secondary | ICD-10-CM | POA: Diagnosis not present

## 2021-05-16 DIAGNOSIS — N200 Calculus of kidney: Secondary | ICD-10-CM

## 2021-05-16 DIAGNOSIS — I700 Atherosclerosis of aorta: Secondary | ICD-10-CM | POA: Diagnosis not present

## 2021-05-16 LAB — URINALYSIS, COMPLETE
Bilirubin, UA: NEGATIVE
Glucose, UA: NEGATIVE
Leukocytes,UA: NEGATIVE
Nitrite, UA: NEGATIVE
Protein,UA: NEGATIVE
RBC, UA: NEGATIVE
Specific Gravity, UA: >1.03 — ABNORMAL HIGH (ref 1.005–1.030)
Urobilinogen, Ur: 1 mg/dL (ref 0.2–1.0)
pH, UA: 6.5 (ref 5.0–7.5)

## 2021-05-16 LAB — MICROSCOPIC EXAMINATION: Bacteria, UA: NONE SEEN

## 2021-05-17 ENCOUNTER — Other Ambulatory Visit: Payer: Self-pay

## 2021-05-17 DIAGNOSIS — N200 Calculus of kidney: Secondary | ICD-10-CM

## 2021-05-17 NOTE — Progress Notes (Signed)
[facility] ORDER FORM ? ?Expected date of procedure: 05/25/2021 ? ?Surgeon: John Giovanni, MD ? ?Post op standing: 2-4wk follow up w/KUB prior ? ?Anticoagulation/Aspirin/NSAID standing order: Hold all 72 hours prior ? ?Anesthesia standing order: MAC ? ?VTE standing: SCD's ? ?Dx: Right Nephrolihtiasis ? ?Procedure: right Extracorporeal shock wave lithotripsy ? ?CPT : [id number] ? ?Standing Order Set:  ? ?*NPO after mn, KUB ? ?*NS 166m/hr, Keflex 5023mPO, Benadryl 2566mO, Valium 73m38m, Zofran 4mg 2m? ? ? ?Medications if other than standing orders:   NONE ? ? ? ? ? ? ? ? ? ? ? ? ? ? ?\e ?

## 2021-05-19 LAB — CULTURE, URINE COMPREHENSIVE

## 2021-05-22 ENCOUNTER — Telehealth: Payer: Self-pay

## 2021-05-22 ENCOUNTER — Ambulatory Visit: Admission: RE | Admit: 2021-05-22 | Payer: Medicare Other | Source: Ambulatory Visit

## 2021-05-22 ENCOUNTER — Other Ambulatory Visit: Payer: Self-pay | Admitting: Pulmonary Disease

## 2021-05-22 DIAGNOSIS — J984 Other disorders of lung: Secondary | ICD-10-CM

## 2021-05-22 NOTE — Telephone Encounter (Signed)
Patient called in this morning and states that he is concerned about the surgery (lithotripsy) reports that he and his daughter did some research and think that this is not an option for him. He wanted to let you know, had me cancel and would like to know what you think he should do next. ?

## 2021-05-22 NOTE — Telephone Encounter (Signed)
If he doesn't want intervention for the stone, would recommend KUB in a year to monitor.  Please schedule.   ? ?Hollice Espy, MD ? ?

## 2021-05-25 ENCOUNTER — Ambulatory Visit: Admission: RE | Admit: 2021-05-25 | Payer: Medicare Other | Source: Home / Self Care | Admitting: Urology

## 2021-05-25 ENCOUNTER — Encounter: Admission: RE | Payer: Self-pay | Source: Home / Self Care

## 2021-05-25 SURGERY — LITHOTRIPSY, ESWL
Anesthesia: Moderate Sedation | Laterality: Right

## 2021-06-07 ENCOUNTER — Ambulatory Visit: Payer: Medicare Other | Admitting: Urology

## 2021-06-07 IMAGING — CT CT CHEST W/O CM
2 of 4 series · 15 of 36 positions shown, 18 images · non-contrast
Comparison: None.

CLINICAL DATA: Colon cancer with history of right upper lobe
abnormality seen on recent chest CT.

EXAM:
CT CHEST WITHOUT CONTRAST
TECHNIQUE: Multidetector CT imaging of the chest was performed following the
standard protocol without IV contrast.

[Series 2: thorax · axial · 0.67mm/px · z∈[+1391,+1655]mm · 12 of 157 slices shown, 15 images]
[im 13/157  mediastinal]
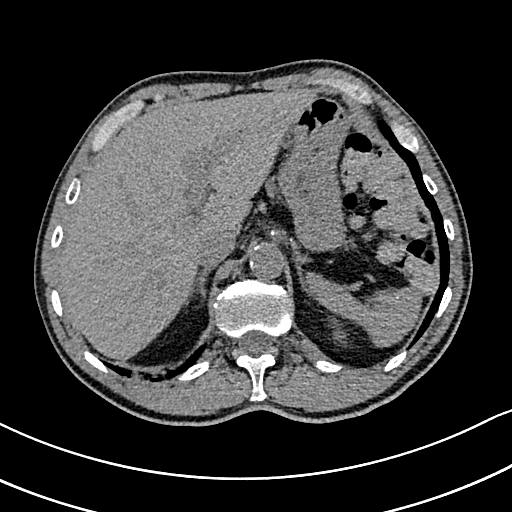
[im 13/157  lung]
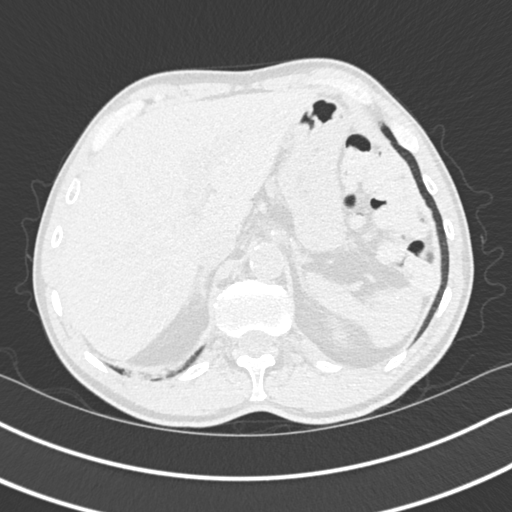
[im 25/157  lung]
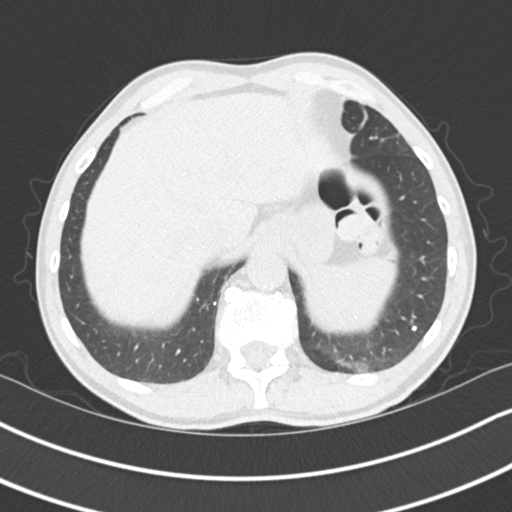
[im 37/157  lung]
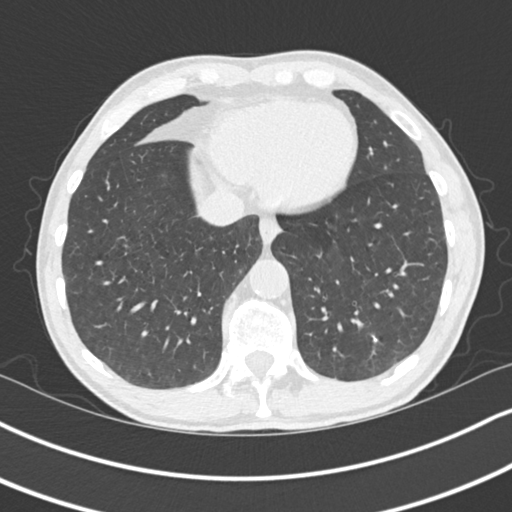
[im 49/157  lung]
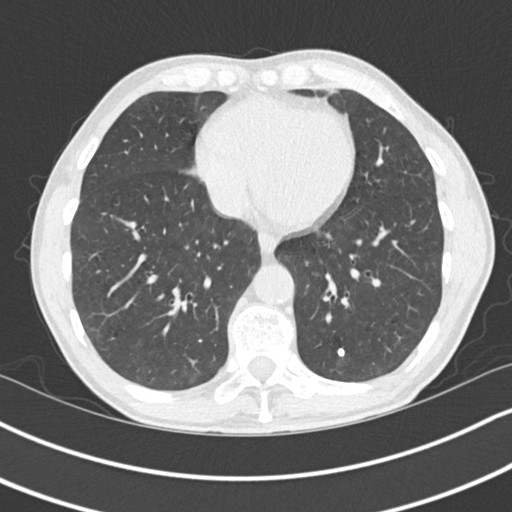
[im 61/157  mediastinal]
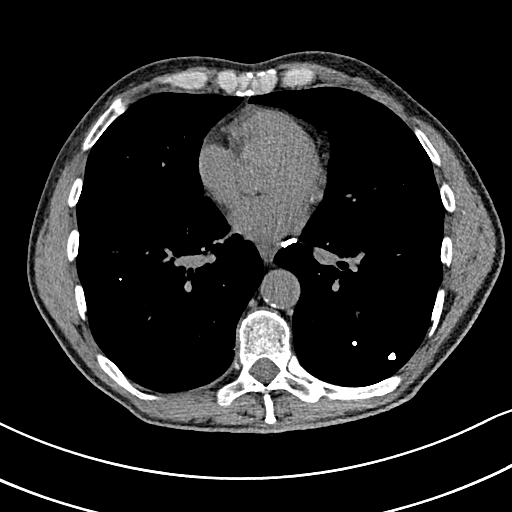
[im 61/157  lung]
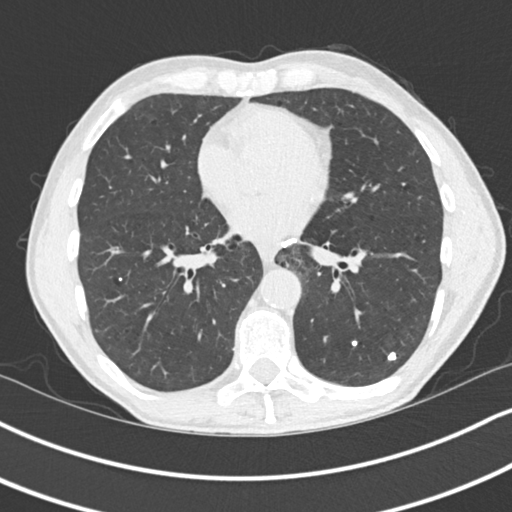
[im 73/157  lung]
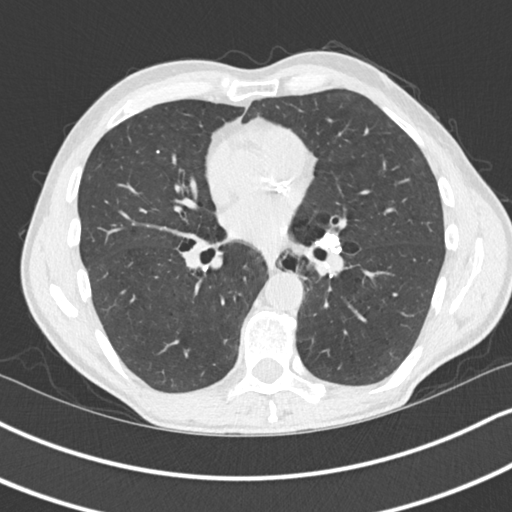
[im 85/157  lung]
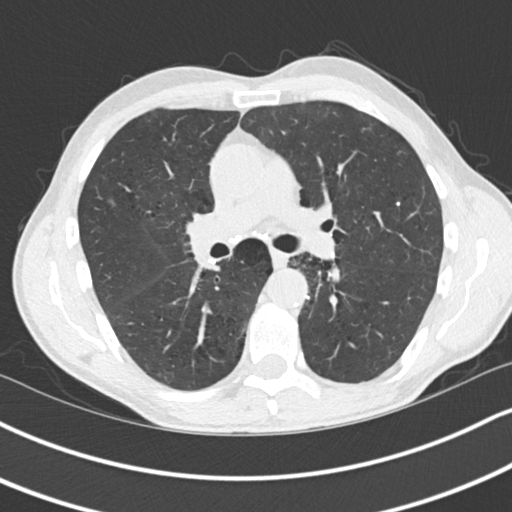
[im 97/157  lung]
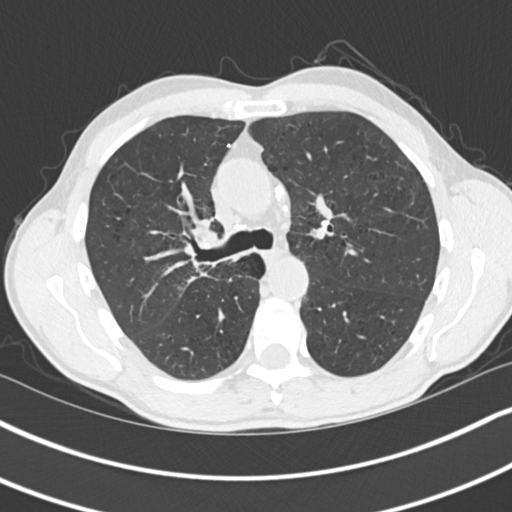
[im 109/157  mediastinal]
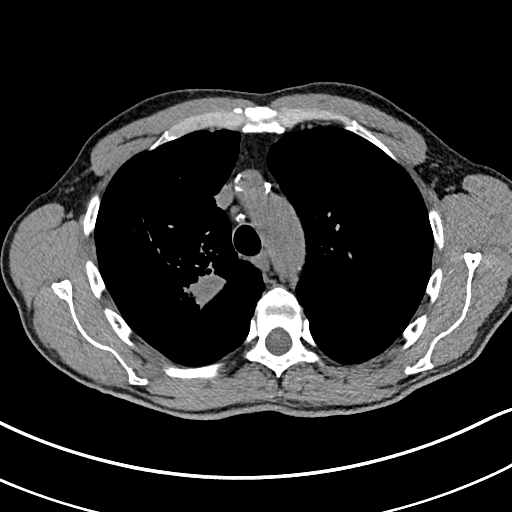
[im 109/157  lung]
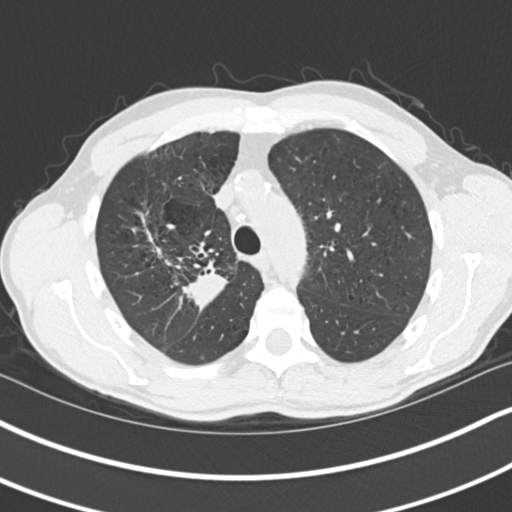
[im 121/157  lung]
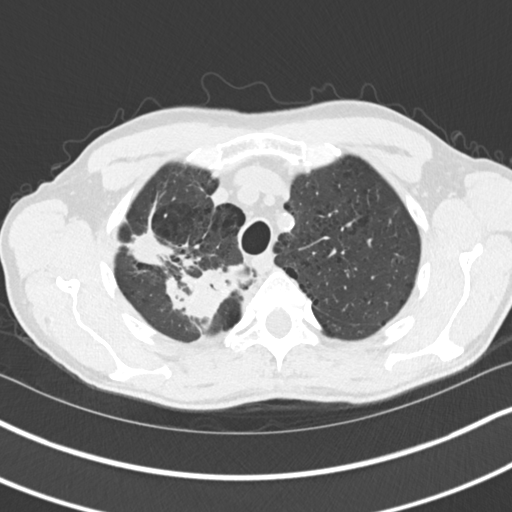
[im 133/157  lung]
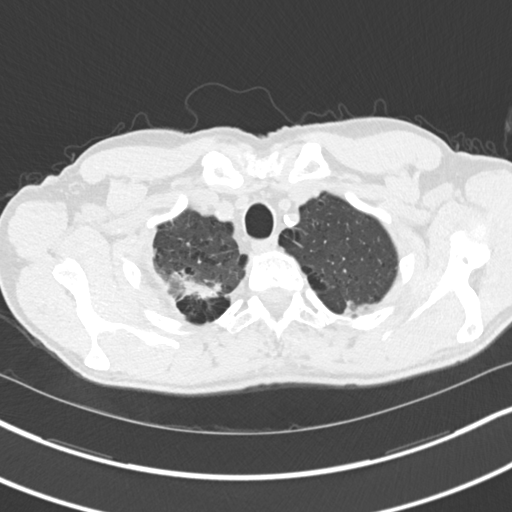
[im 145/157  lung]
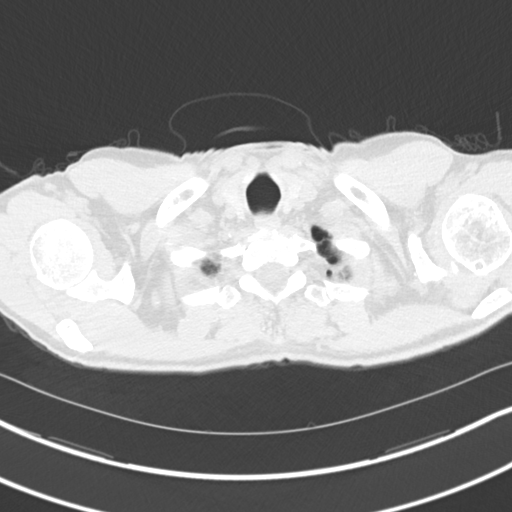

[Series 5: coronal · coronal · 0.64mm/px · 3 of 134 slices shown]
[im 27/134  lung]
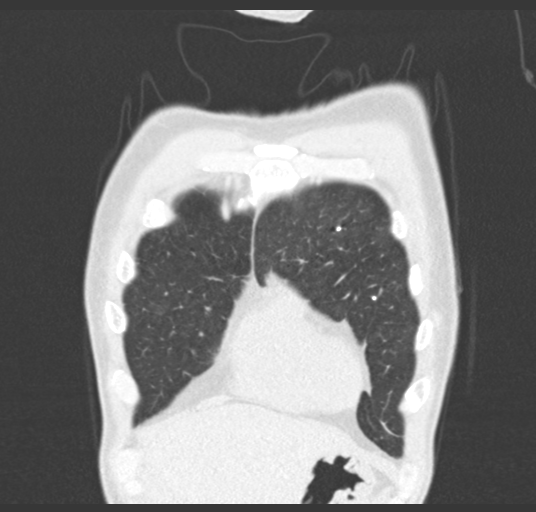
[im 54/134  lung]
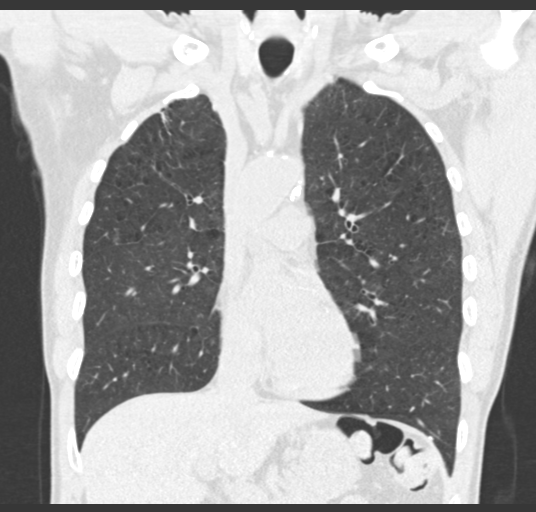
[im 80/134  lung]
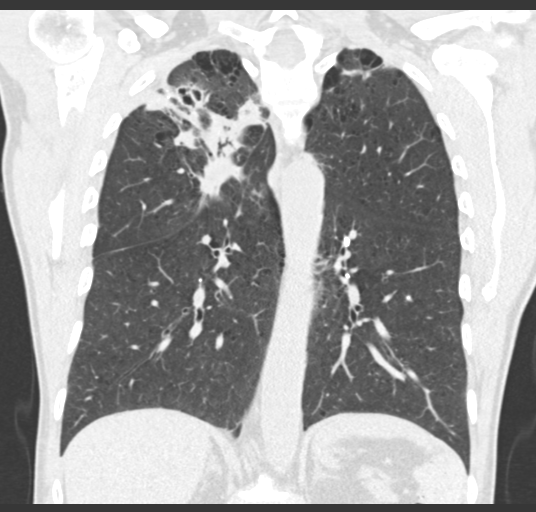

[15 of 36 positions shown; findings below may reference images not displayed]

FINDINGS: Cardiovascular: Coronary artery calcification and generalized aortic
atherosclerosis similar to previous exam. No sign of pericardial
effusion. Stable heart size.

Mediastinum/Nodes: Scattered calcified lymph nodes throughout the
mediastinum and hilum bilaterally.

Lungs/Pleura: Persistent right upper lobe opacification with areas
of cavitation and or thickening of clips in the right upper lobe in
this patient with paraseptal and centrilobular emphysema. Dominant
areas centrally measuring approximately 5.4 x 4.0 cm as compared to
5.5 x 3.9 cm. The second area more anteriorly looks as though it may
be smaller, measurement on today's study approximately 3.0 x 1.7 cm,
previously 3.3 x 2.4 cm. Changes again extend to the pleural surface
and distort the major fissure in the right chest.

Calcified pulmonary nodules are seen throughout both the left and
right lung.

Left-sided apically scarring is unchanged. There is no consolidation
or pleural effusion.

Upper Abdomen: No acute abnormality.

Musculoskeletal: No signs of acute bone finding or evidence of
destructive bone process.
IMPRESSION: Perhaps slight improvement in some areas of this right upper lobe
process. Scarring from a recent episode of infection is considered.
However, little change is seen within the short interval. Occult
neoplasm in this area resulting in postobstructive changes should
also be considered. Bronchoscopic assessment may be helpful.

No new areas of concern.

Aortic Atherosclerosis (E4C53-DIX.X) and Emphysema (E4C53-ENQ.C).

## 2021-06-14 NOTE — Telephone Encounter (Signed)
Scheduled

## 2021-06-20 ENCOUNTER — Ambulatory Visit (INDEPENDENT_AMBULATORY_CARE_PROVIDER_SITE_OTHER): Payer: Medicare Other | Admitting: Family Medicine

## 2021-06-20 ENCOUNTER — Encounter: Payer: Self-pay | Admitting: Family Medicine

## 2021-06-20 VITALS — BP 140/70 | HR 64 | Temp 97.7°F | Ht 63.0 in | Wt 116.2 lb

## 2021-06-20 DIAGNOSIS — H60542 Acute eczematoid otitis externa, left ear: Secondary | ICD-10-CM | POA: Diagnosis not present

## 2021-06-20 DIAGNOSIS — K5900 Constipation, unspecified: Secondary | ICD-10-CM | POA: Diagnosis not present

## 2021-06-20 MED ORDER — MUPIROCIN 2 % EX OINT: 1.0000 "application " | TOPICAL_OINTMENT | Freq: Two times a day (BID) | CUTANEOUS | 0 refills | Status: DC

## 2021-06-20 MED ORDER — POLYETHYLENE GLYCOL 3350 17 GM/SCOOP PO POWD: 8.5000 g | Freq: Every day | ORAL | 0 refills | Status: DC | PRN

## 2021-06-20 NOTE — Patient Instructions (Addendum)
Continue good fiber intake in the diet.  You need to increase fluids - since you can't tolerate water, drink body armor drinks.  Start miralax 1/2-1 capful daily, hold for diarrhea.  Try soluble fiber supplement like metamucil or benefiber - but it only works with increased water.  Try to limit laxative use.  If above doesn't help, let me know to consider prescription constipation medicines and thyroid lab test. For ear - try mupirocin cream and if not better we will refer you to skin doctor.   Constipation, Adult Constipation is when a person has fewer than three bowel movements in a week, has difficulty having a bowel movement, or has stools (feces) that are dry, hard, or larger than normal. Constipation may be caused by an underlying condition. It may become worse with age if a person takes certain medicines and does not take in enough fluids. Follow these instructions at home: Eating and drinking  Eat foods that have a lot of fiber, such as beans, whole grains, and fresh fruits and vegetables. Limit foods that are low in fiber and high in fat and processed sugars, such as fried or sweet foods. These include french fries, hamburgers, cookies, candies, and soda. Drink enough fluid to keep your urine pale yellow. General instructions Exercise regularly or as told by your health care provider. Try to do 150 minutes of moderate exercise each week. Use the bathroom when you have the urge to go. Do not hold it in. Take over-the-counter and prescription medicines only as told by your health care provider. This includes any fiber supplements. During bowel movements: Practice deep breathing while relaxing the lower abdomen. Practice pelvic floor relaxation. Watch your condition for any changes. Let your health care provider know about them. Keep all follow-up visits as told by your health care provider. This is important. Contact a health care provider if: You have pain that gets worse. You have  a fever. You do not have a bowel movement after 4 days. You vomit. You are not hungry or you lose weight. You are bleeding from the opening between the buttocks (anus). You have thin, pencil-like stools. Get help right away if: You have a fever and your symptoms suddenly get worse. You leak stool or have blood in your stool. Your abdomen is bloated. You have severe pain in your abdomen. You feel dizzy or you faint. Summary Constipation is when a person has fewer than three bowel movements in a week, has difficulty having a bowel movement, or has stools (feces) that are dry, hard, or larger than normal. Eat foods that have a lot of fiber, such as beans, whole grains, and fresh fruits and vegetables. Drink enough fluid to keep your urine pale yellow. Take over-the-counter and prescription medicines only as told by your health care provider. This includes any fiber supplements. This information is not intended to replace advice given to you by your health care provider. Make sure you discuss any questions you have with your health care provider. Document Revised: 11/19/2018 Document Reviewed: 11/19/2018 Elsevier Patient Education  [place].

## 2021-06-20 NOTE — Progress Notes (Signed)
Patient ID: Mario Mcdowell, male    DOB: 30-Oct-1950, 71 y.o.   MRN: [id number]  This visit was conducted in person.  BP 140/70   Pulse 64   Temp 97.7 F (36.5 C) (Temporal)   Ht '5\' 3"'$  (1.6 m)   Wt 116 lb 4 oz (52.7 kg)   SpO2 96%   BMI 20.59 kg/m    CC: constipation Subjective:   HPI: Mario Mcdowell is a 71 y.o. male presenting on 06/20/2021 for Constipation (C/o constipation.  Started about 2 wks ago.  Last BM- 2 days ago.  Tried Dulcolax stool softener/laxative.  States he's due for colonoscopy 07/2021. )   2 wk h/o constipation and bloated feeling. No abdominal pain, nausea/vomiting, blood in stool, fevers/chills or lower back pain.  Passing gas well.  No changes in diet, doesn't drink much water. Instead eats watermelon.  Overall good fiber intake (beans, greens, cabbage).  No new supplements or vitamins or OTC.  Drinks 3 cups of coffee in am and several more during the day.   Has needed dulcolax stool softener/laxative to have BM.   Prior to this regularly had daily BM.  No h/o constipation issues in the past.   By the way - steroid cream for left external ear dermatitis without significant benefit has used for >1 month.      Relevant past medical, surgical, family and social history reviewed and updated as indicated. Interim medical history since our last visit reviewed. Allergies and medications reviewed and updated. Outpatient Medications Prior to Visit  Medication Sig Dispense Refill   aspirin EC 81 MG tablet Take 81 mg by mouth daily.     ibuprofen (ADVIL) 200 MG tablet Take 400 mg by mouth every 6 (six) hours as needed for moderate pain.     rosuvastatin (CRESTOR) 5 MG tablet Take 1 tablet (5 mg total) by mouth daily. 90 tablet 3   No facility-administered medications prior to visit.     Per HPI unless specifically indicated in ROS section below Review of Systems  Objective:  BP 140/70   Pulse 64   Temp 97.7 F (36.5 C) (Temporal)   Ht '5\' 3"'$  (1.6 m)    Wt 116 lb 4 oz (52.7 kg)   SpO2 96%   BMI 20.59 kg/m   Wt Readings from Last 3 Encounters:  06/20/21 116 lb 4 oz (52.7 kg)  05/16/21 118 lb (53.5 kg)  05/08/21 118 lb 6.4 oz (53.7 kg)      Physical Exam Vitals and nursing note reviewed.  Constitutional:      Appearance: Normal appearance. He is not ill-appearing.  HENT:     Right Ear: External ear normal.     Ears:     Comments: Dry skin with mild scabbing to left external ear/pinna, inner canal not affected Cardiovascular:     Rate and Rhythm: Normal rate and regular rhythm.     Pulses: Normal pulses.     Heart sounds: Normal heart sounds. No murmur heard. Pulmonary:     Effort: Pulmonary effort is normal. No respiratory distress.     Breath sounds: Normal breath sounds. No wheezing, rhonchi or rales.  Abdominal:     General: Abdomen is flat. Bowel sounds are normal. There is no distension.     Palpations: Abdomen is soft. There is no mass.     Tenderness: There is no abdominal tenderness. There is no right CVA tenderness, left CVA tenderness, guarding or rebound.  Hernia: No hernia is present.  Musculoskeletal:     Right lower leg: No edema.     Left lower leg: No edema.  Skin:    General: Skin is warm and dry.     Findings: Rash present.  Neurological:     Mental Status: He is alert.      Lab Results  Component Value Date   TSH 1.76 01/15/2019    Assessment & Plan:   Problem List Items Addressed This Visit     Dermatitis of left ear canal    Ongoing dermatitis to left external ear, failed topical steroid. Will trial bactroban topical antibiotic in case component of infection. Update if not improving to consider derm eval.       Constipation    Acute constipation without red flags in setting of chronically poor fluid intake. Constipation care / bowel regimen reviewed - rec start with increased fluid intake, add soluble fiber supplement, add miralax. Update if not improved with this.  Limit laxative use to  avoid dependence.         Meds ordered this encounter  Medications   polyethylene glycol powder (GLYCOLAX/MIRALAX) 17 GM/SCOOP powder    Sig: Take 8.5-17 g by mouth daily as needed for moderate constipation.    Dispense:  3350 g    Refill:  0   mupirocin ointment (BACTROBAN) 2 %    Sig: Apply 1 application. topically 2 (two) times daily. To affected area on left external ear    Dispense:  15 g    Refill:  0   No orders of the defined types were placed in this encounter.    Patient instructions: Continue good fiber intake in the diet.  You need to increase fluids - since you can't tolerate water, drink body armor drinks.  Start miralax 1/2-1 capful daily, hold for diarrhea.  Try soluble fiber supplement like metamucil or benefiber - but it only works with increased water.  Try to limit laxative use.  If above doesn't help, let me know to consider prescription constipation medicines.   Follow up plan: Return if symptoms worsen or fail to improve.  Ria Bush, MD

## 2021-06-23 ENCOUNTER — Telehealth: Payer: Self-pay | Admitting: Family Medicine

## 2021-06-23 NOTE — Telephone Encounter (Signed)
Ensure he's taking miralax with increased fluid intake.  Next step is to add milk of magnesia - he may buy bottle OTC and use as directed on label. Could try 63m twice daily for next 2-3 days. Update with effect.

## 2021-06-23 NOTE — Telephone Encounter (Signed)
Patient states that he started taking the miralax that was recommended and he still has  not had any bowel movements. He also states that he has had very little discomfort other than a little bloating this morning that went away.

## 2021-06-23 NOTE — Telephone Encounter (Signed)
Spoke with pt relaying Dr. Synthia Innocent message.  Pt verbalizes understanding and will let Dr. Darnell Level know how it goes.

## 2021-06-24 DIAGNOSIS — K5900 Constipation, unspecified: Secondary | ICD-10-CM | POA: Insufficient documentation

## 2021-06-24 NOTE — Assessment & Plan Note (Signed)
Ongoing dermatitis to left external ear, failed topical steroid. Will trial bactroban topical antibiotic in case component of infection. Update if not improving to consider derm eval.

## 2021-06-24 NOTE — Assessment & Plan Note (Signed)
Acute constipation without red flags in setting of chronically poor fluid intake. Constipation care / bowel regimen reviewed - rec start with increased fluid intake, add soluble fiber supplement, add miralax. Update if not improved with this.  Limit laxative use to avoid dependence.

## 2021-08-18 ENCOUNTER — Encounter: Payer: Self-pay | Admitting: Gastroenterology

## 2021-09-21 ENCOUNTER — Ambulatory Visit: Payer: Medicare Other | Admitting: *Deleted

## 2021-09-21 VITALS — Ht 63.0 in | Wt 115.8 lb

## 2021-09-21 DIAGNOSIS — Z85038 Personal history of other malignant neoplasm of large intestine: Secondary | ICD-10-CM

## 2021-09-21 DIAGNOSIS — Z86010 Personal history of colonic polyps: Secondary | ICD-10-CM

## 2021-09-21 MED ORDER — NA SULFATE-K SULFATE-MG SULF 17.5-3.13-1.6 GM/177ML PO SOLN: 1.0000 | Freq: Once | ORAL | 0 refills | Status: AC

## 2021-09-21 NOTE — Progress Notes (Signed)
No egg or soy allergy known to patient  No issues known to pt with past sedation with any surgeries or procedures Patient denies ever being told they had issues or difficulty with intubation  No FH of Malignant Hyperthermia Pt is not on diet pills Pt is not on  home 02  Pt is not on blood thinners  Pt denies issues with constipation  No A fib or A flutter Have any cardiac testing pending--no Pt instructed to use [email or web address] or GoodRx for a price reduction on prep   

## 2021-09-29 ENCOUNTER — Other Ambulatory Visit: Payer: Self-pay

## 2021-09-29 ENCOUNTER — Telehealth: Payer: Self-pay | Admitting: Gastroenterology

## 2021-09-29 MED ORDER — NA SULFATE-K SULFATE-MG SULF 17.5-3.13-1.6 GM/177ML PO SOLN: 1.0000 | ORAL | 0 refills | Status: DC

## 2021-09-29 NOTE — Telephone Encounter (Signed)
Inbound call from patient stating that he went to the pharmacy to get his prep and they stated they did not have the prescription.  Patient is requesting that the prep be resent for him. Patient is scheduled to have procedure on 10/4. Please advise.

## 2021-09-29 NOTE — Telephone Encounter (Signed)
Resent Suprep Rx to pts verified pharmacy.

## 2021-10-03 ENCOUNTER — Encounter: Payer: Self-pay | Admitting: Gastroenterology

## 2021-10-15 HISTORY — PX: COLONOSCOPY: SHX174

## 2021-10-18 ENCOUNTER — Encounter: Payer: Self-pay | Admitting: Gastroenterology

## 2021-10-18 ENCOUNTER — Ambulatory Visit (AMBULATORY_SURGERY_CENTER): Payer: Medicare Other | Admitting: Gastroenterology

## 2021-10-18 VITALS — BP 139/69 | HR 52 | Temp 97.8°F | Resp 9 | Ht 63.0 in | Wt 115.0 lb

## 2021-10-18 DIAGNOSIS — Z86010 Personal history of colonic polyps: Secondary | ICD-10-CM | POA: Diagnosis not present

## 2021-10-18 DIAGNOSIS — Z09 Encounter for follow-up examination after completed treatment for conditions other than malignant neoplasm: Secondary | ICD-10-CM

## 2021-10-18 DIAGNOSIS — D123 Benign neoplasm of transverse colon: Secondary | ICD-10-CM | POA: Diagnosis not present

## 2021-10-18 DIAGNOSIS — Z08 Encounter for follow-up examination after completed treatment for malignant neoplasm: Secondary | ICD-10-CM | POA: Diagnosis not present

## 2021-10-18 DIAGNOSIS — Z85038 Personal history of other malignant neoplasm of large intestine: Secondary | ICD-10-CM | POA: Diagnosis not present

## 2021-10-18 DIAGNOSIS — D122 Benign neoplasm of ascending colon: Secondary | ICD-10-CM | POA: Diagnosis not present

## 2021-10-18 MED ORDER — SODIUM CHLORIDE 0.9 % IV SOLN: 500.0000 mL | Freq: Once | INTRAVENOUS | Status: DC

## 2021-10-18 NOTE — Progress Notes (Signed)
Pt's states no medical or surgical changes since previsit or office visit. 

## 2021-10-18 NOTE — Patient Instructions (Signed)
YOU HAD AN ENDOSCOPIC PROCEDURE TODAY AT [facility]:   Refer to the procedure report that was given to you for any specific questions about what was found during the examination.  If the procedure report does not answer your questions, please call your gastroenterologist to clarify.  If you requested that your care partner not be given the details of your procedure findings, then the procedure report has been included in a sealed envelope for you to review at your convenience later.  **Handouts given on polyps and Hemorrhoids**  YOU SHOULD EXPECT: Some feelings of bloating in the abdomen. Passage of more gas than usual.  Walking can help get rid of the air that was put into your GI tract during the procedure and reduce the bloating. If you had a lower endoscopy (such as a colonoscopy or flexible sigmoidoscopy) you may notice spotting of blood in your stool or on the toilet paper. If you underwent a bowel prep for your procedure, you may not have a normal bowel movement for a few days.  Please Note:  You might notice some irritation and congestion in your nose or some drainage.  This is from the oxygen used during your procedure.  There is no need for concern and it should clear up in a day or so.  SYMPTOMS TO REPORT IMMEDIATELY:  Following lower endoscopy (colonoscopy or flexible sigmoidoscopy):  Excessive amounts of blood in the stool  Significant tenderness or worsening of abdominal pains  Swelling of the abdomen that is new, acute  Fever of 100F or higher   For urgent or emergent issues, a gastroenterologist can be reached at any hour by calling [phone]. Do not use MyChart messaging for urgent concerns.    DIET:  We do recommend a small meal at first, but then you may proceed to your regular diet.  Drink plenty of fluids but you should avoid alcoholic beverages for 24 hours.  ACTIVITY:  You should plan to take it easy for the rest of today and you should NOT DRIVE  or use heavy machinery until tomorrow (because of the sedation medicines used during the test).    FOLLOW UP: Our staff will call the number listed on your records the next business day following your procedure.  We will call around 7:15- 8:00 am to check on you and address any questions or concerns that you may have regarding the information given to you following your procedure. If we do not reach you, we will leave a message.     If any biopsies were taken you will be contacted by phone or by letter within the next 1-3 weeks.  Please call us at [phone] if you have not heard about the biopsies in 3 weeks.    SIGNATURES/CONFIDENTIALITY: You and/or your care partner have signed paperwork which will be entered into your electronic medical record.  These signatures attest to the fact that that the information above on your After Visit Summary has been reviewed and is understood.  Full responsibility of the confidentiality of this discharge information lies with you and/or your care-partner.

## 2021-10-18 NOTE — Progress Notes (Signed)
Called to room to assist during endoscopic procedure.  Patient ID and intended procedure confirmed with present staff. Received instructions for my participation in the procedure from the performing physician.  

## 2021-10-18 NOTE — Progress Notes (Signed)
Pt resting comfortably. VSS. Airway intact. SBAR complete to RN. All questions answered.   

## 2021-10-18 NOTE — Progress Notes (Signed)
History & Physical  Primary Care Physician:  Ria Bush, MD Primary Gastroenterologist: Lucio Edward, MD  CHIEF COMPLAINT: Personal history of colon cancer, Personal history of colon polyps   HPI: Mario Mcdowell is a 71 y.o. male with a personal history of colon cancer in 2018 and adenomatous colon polyps for colonoscopy.   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 partial colectomy [name]) 05/21/2016 and chemotherapy is planned   Full dentures    History of kidney stones 07/2015   passed without intervention   Hyperlipidemia    Pulmonary nodules    per chest CT 04-23-2016 bilateral nodules (consistent w/ granulomatous disease)   Smokers' cough ([place])    Wears contact lenses     Past Surgical History:  Procedure Laterality Date   COLONOSCOPY  03/12/2016   4 polyps, sigmoid tumor biopsied (TVA w/ MICROSCOPIC FOCUS SUSPICIOUS FOR INVASION) pending surgical resection, rpt 1 yr [facility] Plan)   COLONOSCOPY  05/2017   2 benign polyps, rpt 2 yrs [facility] Plan)   COLONOSCOPY  07/2019   TAs, rpt 2 yrs [facility] Plan)   LAPAROSCOPIC PARTIAL COLECTOMY N/A 05/21/2016   colon cancer, 1/20 positive lymp nodes. LAPAROSCOPIC PARTIAL COLECTOMY, SPLENIC FLEXURE MOBILIZATION AND PROCTOSCOPY;  [name], MD)   MULTIPLE TOOTH EXTRACTIONS  [phone]   PORT-A-CATH REMOVAL Right 10/04/2016   Procedure: REMOVAL PORT-A-CATH;  Surgeon: [name], MD;  Location: [facility];  Service: General;  Laterality: Right;    PORTACATH PLACEMENT N/A 06/21/2016   Procedure: INSERTION PORT-A-CATH;  Surgeon: [name], MD;  Location: [place];  Service: General;  Laterality: N/A;  right   VIDEO BRONCHOSCOPY WITH ENDOBRONCHIAL NAVIGATION N/A 11/14/2018   VIDEO BRONCHOSCOPY WITH ENDOBRONCHIAL NAVIGATION for R lung cavitary lesions;  Garner Nash, DO    Prior to Admission medications   Medication Sig Start Date End Date Taking? Authorizing Provider  aspirin EC 81 MG tablet Take 81 mg by mouth daily.   Yes [provider]  rosuvastatin (CRESTOR) 5 MG tablet Take 1 tablet (5 mg total) by mouth daily. 02/24/21  Yes Ria Bush, MD  ibuprofen (ADVIL) 200 MG tablet Take 400 mg by mouth every 6 (six) hours as needed for moderate pain.    [provider]  mupirocin ointment (BACTROBAN) 2 % Apply 1 application. topically 2 (two) times daily. To affected area on left external ear 06/20/21   Ria Bush, MD    Current Outpatient Medications  Medication Sig Dispense Refill   aspirin EC 81 MG tablet Take 81 mg by mouth daily.     rosuvastatin (CRESTOR) 5 MG tablet Take 1 tablet (5 mg total) by mouth daily. 90 tablet 3   ibuprofen (ADVIL) 200 MG tablet Take 400 mg by mouth every 6 (six) hours as needed for moderate pain.     mupirocin ointment (BACTROBAN) 2 % Apply 1 application. topically 2 (two) times daily. To affected area on left external ear 15 g 0   Current Facility-Administered Medications  Medication Dose Route Frequency Provider  Last Rate Last Admin   0.9 %  sodium chloride infusion  500 mL Intravenous Once Ladene Artist, MD        Allergies as of 10/18/2021 - Review Complete 10/18/2021  Allergen Reaction Noted   Penicillins Swelling 06/09/2011   Lipitor [atorvastatin] Nausea Only 07/24/2016    Family History  Problem Relation Age of Onset   Cancer Paternal Aunt        unsure details   Diabetes Cousin    Cancer Paternal Uncle        unknow type  cancer    Prostate cancer Neg Hx    Kidney cancer Neg Hx    CAD Neg Hx    Stroke Neg Hx    Colon cancer Neg Hx    Esophageal cancer Neg Hx    Stomach cancer Neg Hx    Rectal cancer Neg Hx     Social History   Socioeconomic History   Marital status: Married    Spouse name: Not on file   Number of children: Not on file   Years of education: Not on file   Highest education level: Not on file  Occupational History   Not on file  Tobacco Use   Smoking status: Every Day    Packs/day: 1.00    Years: 46.00    Total pack years: 46.00    Types: Cigarettes   Smokeless tobacco: Never   Tobacco comments:    3/4 pack daily  Vaping Use   Vaping Use: Never used  Substance and Sexual Activity   Alcohol use: No    Comment: quit 2000   Drug use: No   Sexual activity: Not Currently    Birth control/protection: Abstinence  Other Topics Concern   Not on file  Social History Narrative   Lives with wife, adopted daughter, adopted son   Occ: retired Games developer   Edu: 8th grade    Activity: no regular exercise   Diet: no water - causes nausea. Fruits/vegetables daily.    Social Determinants of Health   Financial Resource Strain: Low Risk  (02/17/2021)   Overall Financial Resource Strain (CARDIA)    Difficulty of Paying Living Expenses: Not hard at all  Food Insecurity: No Food Insecurity (02/17/2021)   Hunger Vital Sign    Worried About Running Out of Food in the Last Year: Never true    Ran Out of Food in the Last Year: Never true  Transportation Needs: No Transportation Needs (02/17/2021)   PRAPARE - Hydrologist (Medical): No    Lack of Transportation (Non-Medical): No  Physical Activity: Sufficiently Active (02/17/2021)   Exercise Vital Sign    Days of Exercise per Week: 3 days    Minutes of Exercise per Session: 60 min  Stress: No Stress Concern Present (02/17/2021)   [place]    Feeling  of Stress : Not at all  Social Connections: Socially Isolated (02/17/2021)   Social Connection and Isolation Panel [NHANES]    Frequency of Communication with Friends and Family: Once a week    Frequency of Social Gatherings with Friends and Family: Once a week    Attends Religious Services: Never    Marine scientist or Organizations: No    Attends Archivist Meetings: Never    Marital Status: Married  Human resources officer Violence: Not At Risk (02/17/2021)   Humiliation, Afraid, Rape, and Kick questionnaire    Fear  of Current or Ex-Partner: No    Emotionally Abused: No    Physically Abused: No    Sexually Abused: No    Review of Systems:  All systems reviewed were negative except where noted in HPI.   Physical Exam: General:  Alert, well-developed, in NAD Head:  Normocephalic and atraumatic. Eyes:  Sclera clear, no icterus.   Conjunctiva pink. Ears:  Normal auditory acuity. Mouth:  No deformity or lesions.  Neck:  Supple; no masses . Lungs:  Clear throughout to auscultation.   No wheezes, crackles, or rhonchi. No acute distress. Heart:  Regular rate and rhythm; no murmurs. Abdomen:  Soft, nondistended, nontender. No masses, hepatomegaly. No obvious masses.  Normal bowel .    Rectal:  Deferred   Msk:  Symmetrical without gross deformities.. Pulses:  Normal pulses noted. Extremities:  Without edema. Neurologic:  Alert and  oriented x4;  grossly normal neurologically. Skin:  Intact without significant lesions or rashes. Cervical Nodes:  No significant cervical adenopathy. Psych:  Alert and cooperative. Normal mood and affect.   Impression / Plan:   Personal history of colon cancer and adenomatous colon polyps for colonoscopy.  Pricilla Riffle. [facility] Plan  10/18/2021, 11:08 AM See Shea Evans, [facility], to contact our on call provider

## 2021-10-18 NOTE — Op Note (Signed)
[place] Patient Name: Mario Mcdowell Procedure Date: 10/18/2021 11:09 AM MRN: [id number] Endoscopist: Ladene Artist , MD Age: 71 Referring MD:  Date of Birth: 01-23-1950 Gender: Male Account #: [id number] Procedure:                Colonoscopy Indications:              High risk colon cancer surveillance: Personal                            history of colon cancer and adenomatous colon polyps Medicines:                Monitored Anesthesia Care Procedure:                Pre-Anesthesia Assessment:                           - Prior to the procedure, a History and Physical                            was performed, and patient medications and                            allergies were reviewed. The patient's tolerance of                            previous anesthesia was also reviewed. The risks                            and benefits of the procedure and the sedation                            options and risks were discussed with the patient.                            All questions were answered, and informed consent                            was obtained. Prior Anticoagulants: The patient has                            taken no previous anticoagulant or antiplatelet                            agents. ASA Grade Assessment: III - A patient with                            severe systemic disease. After reviewing the risks                            and benefits, the patient was deemed in                            satisfactory condition to undergo the procedure.  After obtaining informed consent, the colonoscope                            was passed under direct vision. Throughout the                            procedure, the patient's blood pressure, pulse, and                            oxygen saturations were monitored continuously. The                            Olympus PCF-H190DL ([id number]) Colonoscope was                            introduced  through the anus and advanced to the the                            cecum, identified by appendiceal orifice and                            ileocecal valve. The ileocecal valve, appendiceal                            orifice, and rectum were photographed. The quality                            of the bowel preparation was fair despite extensive                            lavage, suction. The colonoscopy was performed                            without difficulty. The patient tolerated the                            procedure well. Scope In: 67:12:45 AM Scope Out: 11:34:13 AM Scope Withdrawal Time: 0 hours 14 minutes 30 seconds  Total Procedure Duration: 0 hours 17 minutes 55 seconds  Findings:                 The perianal and digital rectal examinations were                            normal.                           Two sessile polyps were found in the transverse                            colon and ascending colon. The polyps were 8 to 9                            mm in size. These polyps were removed with a cold  snare. Resection and retrieval were complete.                           There was evidence of a prior end-to-end                            colo-rectal anastomosis in the proximal rectum.                            This was patent and was characterized by healthy                            appearing mucosa. The anastomosis was traversed.                           Internal hemorrhoids were found. The hemorrhoids                            were small and Grade I (internal hemorrhoids that                            do not prolapse).                           The exam was otherwise without abnormality on                            direct views. Unable to safely retroflex in the                            narrow, post surgical rectum. Complications:            No immediate complications. Estimated blood loss:                            None. Estimated  Blood Loss:     Estimated blood loss: none. Impression:               - Preparation of the colon was fair.                           - Two 8 to 9 mm polyps in the transverse colon and                            in the ascending colon, removed with a cold snare.                            Resected and retrieved.                           - Patent end-to-end colo-rectal anastomosis,                            characterized by healthy appearing mucosa.                           -  Internal hemorrhoids.                           - The examination was otherwise normal on direct                            views. Recommendation:           - Repeat colonoscopy in 3 years after studies are                            complete for surveillance based on pathology                            results with a more extensive bowel prep.                           - Patient has a contact number available for                            emergencies. The signs and symptoms of potential                            delayed complications were discussed with the                            patient. Return to normal activities tomorrow.                            Written discharge instructions were provided to the                            patient.                           - Resume previous diet.                           - Continue present medications.                           - Await pathology results. Ladene Artist, MD 10/18/2021 11:40:52 AM This report has been signed electronically.

## 2021-10-19 ENCOUNTER — Telehealth: Payer: Self-pay

## 2021-10-19 NOTE — Telephone Encounter (Signed)
  Follow up Call-     10/18/2021   10:30 AM 07/16/2019    7:39 AM  Call back number  Post procedure Call Back phone  # [phone]  Permission to leave phone message Yes Yes    Follow up call made.  NALM

## 2021-10-25 ENCOUNTER — Encounter: Payer: Self-pay | Admitting: Gastroenterology

## 2022-02-12 ENCOUNTER — Encounter: Payer: Self-pay | Admitting: Hematology

## 2022-02-18 ENCOUNTER — Other Ambulatory Visit: Payer: Self-pay | Admitting: Family Medicine

## 2022-02-18 DIAGNOSIS — C187 Malignant neoplasm of sigmoid colon: Secondary | ICD-10-CM

## 2022-02-18 DIAGNOSIS — Z125 Encounter for screening for malignant neoplasm of prostate: Secondary | ICD-10-CM

## 2022-02-18 DIAGNOSIS — R7303 Prediabetes: Secondary | ICD-10-CM

## 2022-02-18 DIAGNOSIS — E7800 Pure hypercholesterolemia, unspecified: Secondary | ICD-10-CM

## 2022-02-19 ENCOUNTER — Ambulatory Visit (INDEPENDENT_AMBULATORY_CARE_PROVIDER_SITE_OTHER): Payer: Medicare Other

## 2022-02-19 ENCOUNTER — Other Ambulatory Visit (INDEPENDENT_AMBULATORY_CARE_PROVIDER_SITE_OTHER): Payer: Medicare Other

## 2022-02-19 VITALS — Ht 63.0 in | Wt 115.0 lb

## 2022-02-19 DIAGNOSIS — Z0000 Encounter for general adult medical examination without abnormal findings: Secondary | ICD-10-CM | POA: Diagnosis not present

## 2022-02-19 DIAGNOSIS — R7303 Prediabetes: Secondary | ICD-10-CM

## 2022-02-19 DIAGNOSIS — E7800 Pure hypercholesterolemia, unspecified: Secondary | ICD-10-CM

## 2022-02-19 DIAGNOSIS — Z125 Encounter for screening for malignant neoplasm of prostate: Secondary | ICD-10-CM | POA: Diagnosis not present

## 2022-02-19 DIAGNOSIS — C187 Malignant neoplasm of sigmoid colon: Secondary | ICD-10-CM | POA: Diagnosis not present

## 2022-02-19 DIAGNOSIS — C772 Secondary and unspecified malignant neoplasm of intra-abdominal lymph nodes: Secondary | ICD-10-CM | POA: Diagnosis not present

## 2022-02-19 LAB — COMPREHENSIVE METABOLIC PANEL
ALT: 21 U/L (ref 0–53)
AST: 20 U/L (ref 0–37)
Albumin: 4.4 g/dL (ref 3.5–5.2)
Alkaline Phosphatase: 75 U/L (ref 39–117)
BUN: 16 mg/dL (ref 6–23)
CO2: 27 mEq/L (ref 19–32)
Calcium: 9.1 mg/dL (ref 8.4–10.5)
Chloride: 103 mEq/L (ref 96–112)
Creatinine, Ser: 0.9 mg/dL (ref 0.40–1.50)
GFR: 85.93 mL/min (ref >60.00)
Glucose, Bld: 78 mg/dL (ref 70–99)
Potassium: 4.5 mEq/L (ref 3.5–5.1)
Sodium: 137 mEq/L (ref 135–145)
Total Bilirubin: 0.4 mg/dL (ref 0.2–1.2)
Total Protein: 7 g/dL (ref 6.0–8.3)

## 2022-02-19 LAB — LIPID PANEL
Cholesterol: 167 mg/dL (ref 0–200)
HDL: 37.3 mg/dL — ABNORMAL LOW (ref >39.00)
LDL Cholesterol: 106 mg/dL — ABNORMAL HIGH (ref 0–99)
NonHDL: 129.74
Total CHOL/HDL Ratio: 4
Triglycerides: 117 mg/dL (ref 0.0–149.0)
VLDL: 23.4 mg/dL (ref 0.0–40.0)

## 2022-02-19 LAB — CBC WITH DIFFERENTIAL/PLATELET
Basophils Absolute: 0 10*3/uL (ref 0.0–0.1)
Basophils Relative: 0.3 % (ref 0.0–3.0)
Eosinophils Absolute: 0.1 10*3/uL (ref 0.0–0.7)
Eosinophils Relative: 1.1 % (ref 0.0–5.0)
HCT: 46 % (ref 39.0–52.0)
Hemoglobin: 15.8 g/dL (ref 13.0–17.0)
Lymphocytes Relative: 47.7 % — ABNORMAL HIGH (ref 12.0–46.0)
Lymphs Abs: 3.8 10*3/uL (ref 0.7–4.0)
MCHC: 34.3 g/dL (ref 30.0–36.0)
MCV: 95.5 fl (ref 78.0–100.0)
Monocytes Absolute: 0.6 10*3/uL (ref 0.1–1.0)
Monocytes Relative: 7 % (ref 3.0–12.0)
Neutro Abs: 3.5 10*3/uL (ref 1.4–7.7)
Neutrophils Relative %: 43.9 % (ref 43.0–77.0)
Platelets: 168 10*3/uL (ref 150.0–400.0)
RBC: 4.82 Mil/uL (ref 4.22–5.81)
RDW: 14.2 % (ref 11.5–15.5)
WBC: 7.9 10*3/uL (ref 4.0–10.5)

## 2022-02-19 LAB — HEMOGLOBIN A1C: Hgb A1c MFr Bld: 6.2 % (ref 4.6–6.5)

## 2022-02-19 LAB — PSA: PSA: 0.64 ng/mL (ref 0.10–4.00)

## 2022-02-19 NOTE — Progress Notes (Signed)
Subjective:   Mario Mcdowell is a 72 y.o. male who presents for Medicare Annual/Subsequent preventive examination.  Review of Systems    No ROS.  Medicare Wellness Virtual Visit.  Visual/audio telehealth visit, UTA vital signs.   See social history for additional risk factors.   Cardiac Risk Factors include: advanced age (>11mn, >>37women);male gender     Objective:    Today's Vitals   02/19/22 0836  Weight: 115 lb (52.2 kg)  Height: '5\' 3"'$  (1.6 m)   Body mass index is 20.37 kg/m.     02/19/2022    8:31 AM 02/17/2021    8:24 AM 02/17/2020    8:12 AM 03/04/2019   12:59 PM 01/15/2019    9:45 AM 11/14/2018    6:07 AM 01/10/2018   10:14 AM  Advanced Directives  Does Patient Have a Medical Advance Directive? Yes Yes No No No No No  Type of AParamedicof [place]Living will [place] will       Does patient want to make changes to medical advance directive? No - Patient declined Yes (MAU/Ambulatory/Procedural Areas - Information given) No - Patient declined      Copy of [place] Chart? Yes - validated most recent copy scanned in chart (See row information)        Would patient like information on creating a medical advance directive?    No - Patient declined No - Patient declined No - Patient declined No - Patient declined    Current Medications (verified) Outpatient Encounter Medications as of 02/19/2022  Medication Sig   aspirin EC 81 MG tablet Take 81 mg by mouth daily.   ibuprofen (ADVIL) 200 MG tablet Take 400 mg by mouth every 6 (six) hours as needed for moderate pain.   mupirocin ointment (BACTROBAN) 2 % Apply 1 application. topically 2 (two) times daily. To affected area on left external ear   rosuvastatin (CRESTOR) 5 MG tablet Take 1 tablet (5 mg total) by mouth daily.   No facility-administered encounter medications on file as of 02/19/2022.    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 partial colectomy ([name] 05/21/2016 and chemotherapy is planned   Full dentures    History of kidney stones 07/2015   passed without intervention   Hyperlipidemia    Pulmonary nodules    per chest CT 04-23-2016 bilateral nodules (consistent w/ granulomatous disease)   Smokers' cough ([facility]    Wears contact lenses    Past Surgical History:  Procedure Laterality Date   COLONOSCOPY  03/12/2016   4 polyps, sigmoid tumor biopsied (TVA w/ MICROSCOPIC FOCUS SUSPICIOUS FOR INVASION) pending surgical resection, rpt 1 yr ([place] Plan   COLONOSCOPY  05/2017   2 benign polyps, rpt 2 yrs ([facility] Plan   COLONOSCOPY  07/2019   TAs, rpt 2 yrs ([facility] Plan   LAPAROSCOPIC PARTIAL COLECTOMY N/A 05/21/2016   colon cancer, 1/20 positive lymp nodes. LAPAROSCOPIC PARTIAL COLECTOMY,  SPLENIC FLEXURE MOBILIZATION AND PROCTOSCOPY;  [name], MD)   MULTIPLE TOOTH EXTRACTIONS  [phone]   PORT-A-CATH REMOVAL Right 10/04/2016   Procedure: REMOVAL PORT-A-CATH;  Surgeon: [name], MD;  Location: [facility];  Service: General;  Laterality: Right;   PORTACATH PLACEMENT N/A 06/21/2016   Procedure: INSERTION PORT-A-CATH;  Surgeon: [name], MD;  Location: [place];  Service: General;  Laterality: N/A;  right   VIDEO BRONCHOSCOPY WITH ENDOBRONCHIAL NAVIGATION N/A 11/14/2018   VIDEO BRONCHOSCOPY WITH ENDOBRONCHIAL NAVIGATION for R lung  cavitary lesions;  Icard, Octavio Graves, DO   Family History  Problem Relation Age of Onset   Cancer Paternal Aunt        unsure details   Diabetes Cousin    Cancer Paternal Uncle        unknow type cancer    Prostate cancer Neg Hx    Kidney cancer Neg Hx    CAD Neg Hx    Stroke Neg Hx    Colon cancer Neg Hx    Esophageal cancer Neg Hx    Stomach cancer Neg Hx    Rectal cancer Neg Hx    Social History   Socioeconomic History   Marital status: Married    Spouse name: Not on file   Number of children: Not on file   Years of education: Not on file   Highest education level: Not on file  Occupational History   Not on file  Tobacco Use   Smoking status: Every Day    Packs/day: 1.00    Years: 46.00    Total pack years: 46.00    Types: Cigarettes   Smokeless tobacco: Never   Tobacco comments:    3/4 pack daily  Vaping Use   Vaping Use: Never used  Substance and Sexual Activity   Alcohol use: No    Comment: quit 2000   Drug use: No   Sexual activity: Not Currently    Birth control/protection: Abstinence  Other Topics Concern   Not on file  Social History Narrative   Lives with wife, adopted daughter, adopted son   Occ: retired Games developer   Edu: 8th grade    Activity: no regular exercise   Diet: no water - causes nausea. Fruits/vegetables daily.    Social Determinants of Health   Financial Resource Strain: Low Risk  (02/19/2022)   Overall Financial Resource Strain (CARDIA)    Difficulty of Paying Living Expenses: Not hard at all  Food Insecurity: No Food Insecurity (02/19/2022)   Hunger Vital Sign    Worried About Running Out of Food in the Last Year: Never true    Ran Out of Food in the Last Year: Never true  Transportation Needs: No Transportation Needs (02/19/2022)   PRAPARE - Hydrologist (Medical): No    Lack of Transportation (Non-Medical): No  Physical Activity: Sufficiently Active (02/19/2022)   Exercise Vital Sign    Days of Exercise  per Week: 3 days    Minutes of Exercise per Session: 60 min  Stress: No Stress Concern Present (02/19/2022)   [place]    Feeling of Stress : Not at all  Social Connections: Socially Isolated (02/19/2022)   Social Connection and Isolation Panel [NHANES]    Frequency of Communication with Friends and Family: Once a week    Frequency of Social Gatherings with Friends and Family: Once a week    Attends Religious Services:  Never    Active Member of Clubs or Organizations: No    Attends Archivist Meetings: Never    Marital Status: Married    Tobacco Counseling Ready to quit: Not Answered Counseling given: Not Answered Tobacco comments: 3/4 pack daily   Clinical Intake:  Pre-visit preparation completed: Yes        Diabetes: No  How often do you need to have someone help you when you read instructions, pamphlets, or other written materials from your doctor or pharmacy?: 1 - Never    Interpreter Needed?: No      Activities of Daily Living    02/19/2022    8:32 AM  In your present state of health, do you have any difficulty performing the following activities:  Hearing? 0  Vision? 0  Difficulty concentrating or making decisions? 0  Walking or climbing stairs? 0  Dressing or bathing? 0  Doing errands, shopping? 0  Preparing Food and eating ? N  Using the Toilet? N  In the past six months, have you accidently leaked urine? N  Do you have problems with loss of bowel control? N  Managing your Medications? N  Managing your Finances? N  Housekeeping or managing your Housekeeping? N    Patient Care Team: Ria Bush, MD as PCP - General (Family Medicine) Buford Dresser, MD as PCP - Cardiology (Cardiology) Ladene Artist, MD as Consulting Physician (Gastroenterology) [name], MD as Consulting Physician (General Surgery) Truitt Merle, MD as Consulting Physician  (Hematology) Margaretha Seeds, MD as Consulting Physician (Pulmonary Disease)  Indicate any recent Medical Services you may have received from other than [facility] providers in the past year (date may be approximate).     Assessment:   This is a routine wellness examination for Mario Mcdowell.  I connected with  Mario Mcdowell on 02/19/22 by a audio enabled telemedicine application and verified that I am speaking with the correct person using two identifiers.  Patient Location: Home  Provider Location: Office/Clinic  I discussed the limitations of evaluation and management by telemedicine. The patient expressed understanding and agreed to proceed.   Hearing/Vision screen Hearing Screening - Comments:: Patient is able to hear conversational tones without difficulty.  No issues reported.    Vision Screening - Comments::  Last exam 2023, Dr. Einar Gip  Dietary issues and exercise activities discussed: Current Exercise Habits: Home exercise routine, Intensity: Mild   Goals Addressed             This Visit's Progress    Maintan healthy lifestyle       Healthy diet Stay active       Depression Screen    02/19/2022    8:35 AM 02/17/2021    8:28 AM 02/17/2020    8:13 AM 01/15/2019    9:46 AM 01/10/2018   10:03 AM 01/09/2017    8:34 AM 01/06/2016   10:24 AM  PHQ 2/9 Scores  PHQ - 2 Score 0 0 0 0 0 0 0  PHQ- 9 Score   0 0 0 0     Fall Risk    02/19/2022    8:34 AM 02/17/2021    8:26 AM 02/17/2020    8:13 AM 01/19/2019    2:57 PM 01/15/2019    9:46 AM  [place] in the past year? 0 0 0 0 0  Number falls in past yr: 0 0 0  0  Injury with Fall? 0 0 0  0  Risk for  fall due to :  No Fall Risks No Fall Risks  Medication side effect  Follow up Falls evaluation completed;Falls prevention discussed Falls prevention discussed Falls evaluation completed;Falls prevention discussed  Falls evaluation completed;Falls prevention discussed    FALL RISK PREVENTION PERTAINING TO THE HOME: Home  free of loose throw rugs in walkways, pet beds, electrical cords, etc? Yes  Adequate lighting in your home to reduce risk of falls? Yes   ASSISTIVE DEVICES UTILIZED TO PREVENT FALLS: Life alert? No  Use of a cane, walker or w/c? No   TIMED UP AND GO: Was the test performed? No .   Cognitive Function:    02/17/2020    8:15 AM 01/15/2019    9:48 AM 01/10/2018   10:03 AM 01/09/2017    8:40 AM  MMSE - Mini Mental State Exam  Not completed: Refused     Orientation to time  '5 5 5  '$ Orientation to Place  '5 5 5  '$ Registration  '3 3 3  '$ Attention/ Calculation  3 0 0  Recall  '3 2 3  '$ Recall-comments   unable to recall 1 of 3 words   Language- name 2 objects   0 0  Language- repeat  '1 1 1  '$ Language- follow 3 step command   3 3  Language- read & follow direction   0 0  Write a sentence   0 0  Copy design   0 0  Total score   19 20        Immunizations Immunization History  Administered Date(s) Administered   Fluad Quad(high Dose 65+) 10/30/2018, 02/23/2020   Influenza, High Dose Seasonal PF 02/01/2021   Influenza,inj,Quad PF,6+ Mos 12/05/2016, 12/19/2017   Influenza-Unspecified 02/01/2021   PFIZER(Purple Top)SARS-COV-2 Vaccination 03/16/2019, 04/06/2019, 12/25/2019   Pfizer Covid-19 Vaccine Bivalent Booster 43yr & up 02/01/2021   Pneumococcal Conjugate-13 01/19/2019   Pneumococcal Polysaccharide-23 02/23/2020   TDAP status: Due, Education has been provided regarding the importance of this vaccine. Advised may receive this vaccine at local pharmacy or Health Dept. Aware to provide a copy of the vaccination record if obtained from local pharmacy or Health Dept. Verbalized acceptance and understanding.  Flu Vaccine status: Due, Education has been provided regarding the importance of this vaccine. Advised may receive this vaccine at local pharmacy or Health Dept. Aware to provide a copy of the vaccination record if obtained from local pharmacy or Health Dept. Verbalized acceptance and  understanding.   Shingrix Completed?: No.    Education has been provided regarding the importance of this vaccine. Patient has been advised to call insurance company to determine out of pocket expense if they have not yet received this vaccine. Advised may also receive vaccine at local pharmacy or Health Dept. Verbalized acceptance and understanding.  Screening Tests Health Maintenance  Topic Date Due   DTaP/Tdap/Td (1 - Tdap) Never done   COVID-19 Vaccine (5 - 2023-24 season) 03/07/2022 (Originally 09/15/2021)   INFLUENZA VACCINE  04/15/2022 (Originally 08/15/2021)   Zoster Vaccines- Shingrix (1 of 2) 05/20/2022 (Originally 08/16/1969)   Lung Cancer Screening  05/02/2022   Medicare Annual Wellness (AWV)  02/20/2023   COLONOSCOPY (Pts 45-47yrInsurance coverage will need to be confirmed)  10/18/2024   Pneumonia Vaccine 6568Years old  Completed   Hepatitis C Screening  Completed   HPV VACCINES  Aged Out   Health Maintenance Health Maintenance Due  Topic Date Due   DTaP/Tdap/Td (1 - Tdap) Never done   CT Chest WO Contrast: 04/29/21  Hepatitis C Screening: deferred per patient preference.   Vision Screening: Recommended annual ophthalmology exams for early detection of glaucoma and other disorders of the eye.  Dental Screening: Recommended annual dental exams for proper oral hygiene.  Community Resource Referral / Chronic Care Management: CRR required this visit?  No   CCM required this visit?  No      Plan:     I have personally reviewed and noted the following in the patient's chart:   Medical and social history Use of alcohol, tobacco or illicit drugs  Current medications and supplements including opioid prescriptions. Patient is not currently taking opioid prescriptions. Functional ability and status Nutritional status Physical activity Advanced directives List of other physicians Hospitalizations, surgeries, and ER visits in previous 12 months Vitals Screenings to  include cognitive, depression, and falls Referrals and appointments  In addition, I have reviewed and discussed with patient certain preventive protocols, quality metrics, and best practice recommendations. A written personalized care plan for preventive services as well as general preventive health recommendations were provided to patient.     Leta Jungling, LPN   01/21/3844

## 2022-02-19 NOTE — Patient Instructions (Addendum)
[name] , Thank you for taking time to come for your Medicare Wellness Visit. I appreciate your ongoing commitment to your health goals. Please review the following plan we discussed and let me know if I can assist you in the future.   These are the goals we discussed:  Goals Addressed             This Visit's Progress    Maintan healthy lifestyle       Healthy diet Stay active         This is a list of the screening recommended for you and due dates:  Health Maintenance  Topic Date Due   DTaP/Tdap/Td vaccine (1 - Tdap) Never done   COVID-19 Vaccine (5 - 2023-24 season) 03/07/2022*   Flu Shot  04/15/2022*   Zoster (Shingles) Vaccine (1 of 2) 05/20/2022*   Screening for Lung Cancer  05/02/2022   Medicare Annual Wellness Visit  02/20/2023   Colon Cancer Screening  10/18/2024   Pneumonia Vaccine  Completed   Hepatitis C Screening: USPSTF Recommendation to screen - Ages 18-79 yo.  Completed   HPV Vaccine  Aged Out  *Topic was postponed. The date shown is not the original due date.    Advanced directives: on file.  Conditions/risks identified: none new.  Next appointment: Follow up in one year for your annual wellness visit.   Preventive Care 72 Years and Older, Male  Preventive care refers to lifestyle choices and visits with your health care provider that can promote health and wellness. What does preventive care include? A yearly physical exam. This is also called an annual well check. Dental exams once or twice a year. Routine eye exams. Ask your health care provider how often you should have your eyes checked. Personal lifestyle choices, including: Daily care of your teeth and gums. Regular physical activity. Eating a healthy diet. Avoiding tobacco and drug use. Limiting alcohol use. Practicing safe sex. Taking low doses of aspirin every day. Taking vitamin and mineral supplements as recommended by your health care provider. What happens during an annual  well check? The services and screenings done by your health care provider during your annual well check will depend on your age, overall health, lifestyle risk factors, and family history of disease. Counseling  Your health care provider may ask you questions about your: Alcohol use. Tobacco use. Drug use. Emotional well-being. Home and relationship well-being. Sexual activity. Eating habits. History of falls. Memory and ability to understand (cognition). Work and work Statistician. Screening  You may have the following tests or measurements: Height, weight, and BMI. Blood pressure. Lipid and cholesterol levels. These may be checked every 5 years, or more frequently if you are over 25 years old. Skin check. Lung cancer screening. You may have this screening every year starting at age 52 if you have a 30-pack-year history of smoking and currently smoke or have quit within the past 15 years. Fecal occult blood test (FOBT) of the stool. You may have this test every year starting at age 3. Flexible sigmoidoscopy or colonoscopy. You may have a sigmoidoscopy every 5 years or a colonoscopy every 10 years starting at age 71. Prostate cancer screening. Recommendations will vary depending on your family history and other risks. Hepatitis C blood test. Hepatitis B blood test. Sexually transmitted disease (STD) testing. Diabetes screening. This is done by checking your blood sugar (glucose) after you have not eaten for a while (fasting). You may have this done every 1-3 years. Abdominal  aortic aneurysm (AAA) screening. You may need this if you are a current or former smoker. Osteoporosis. You may be screened starting at age 92 if you are at high risk. Talk with your health care provider about your test results, treatment options, and if necessary, the need for more tests. Vaccines  Your health care provider may recommend certain vaccines, such as: Influenza vaccine. This is recommended every  year. Tetanus, diphtheria, and acellular pertussis (Tdap, Td) vaccine. You may need a Td booster every 10 years. Zoster vaccine. You may need this after age 40. Pneumococcal 13-valent conjugate (PCV13) vaccine. One dose is recommended after age 24. Pneumococcal polysaccharide (PPSV23) vaccine. One dose is recommended after age 109. Talk to your health care provider about which screenings and vaccines you need and how often you need them. This information is not intended to replace advice given to you by your health care provider. Make sure you discuss any questions you have with your health care provider. Document Released: 01/28/2015 Document Revised: 09/21/2015 Document Reviewed: 11/02/2014 Elsevier Interactive Patient Education  2017 [place] Prevention in the Home Falls can cause injuries. They can happen to people of all ages. There are many things you can do to make your home safe and to help prevent falls. What can I do on the outside of my home? Regularly fix the edges of walkways and driveways and fix any cracks. Remove anything that might make you trip as you walk through a door, such as a raised step or threshold. Trim any bushes or trees on the path to your home. Use bright outdoor lighting. Clear any walking paths of anything that might make someone trip, such as rocks or tools. Regularly check to see if handrails are loose or broken. Make sure that both sides of any steps have handrails. Any raised decks and porches should have guardrails on the edges. Have any leaves, snow, or ice cleared regularly. Use sand or salt on walking paths during winter. Clean up any spills in your garage right away. This includes oil or grease spills. What can I do in the bathroom? Use night lights. Install grab bars by the toilet and in the tub and shower. Do not use towel bars as grab bars. Use non-skid mats or decals in the tub or shower. If you need to sit down in the shower, use a  plastic, non-slip stool. Keep the floor dry. Clean up any water that spills on the floor as soon as it happens. Remove soap buildup in the tub or shower regularly. Attach bath mats securely with double-sided non-slip rug tape. Do not have throw rugs and other things on the floor that can make you trip. What can I do in the bedroom? Use night lights. Make sure that you have a light by your bed that is easy to reach. Do not use any sheets or blankets that are too big for your bed. They should not hang down onto the floor. Have a firm chair that has side arms. You can use this for support while you get dressed. Do not have throw rugs and other things on the floor that can make you trip. What can I do in the kitchen? Clean up any spills right away. Avoid walking on wet floors. Keep items that you use a lot in easy-to-reach places. If you need to reach something above you, use a strong step stool that has a grab bar. Keep electrical cords out of the way. Do not use  floor polish or wax that makes floors slippery. If you must use wax, use non-skid floor wax. Do not have throw rugs and other things on the floor that can make you trip. What can I do with my stairs? Do not leave any items on the stairs. Make sure that there are handrails on both sides of the stairs and use them. Fix handrails that are broken or loose. Make sure that handrails are as long as the stairways. Check any carpeting to make sure that it is firmly attached to the stairs. Fix any carpet that is loose or worn. Avoid having throw rugs at the top or bottom of the stairs. If you do have throw rugs, attach them to the floor with carpet tape. Make sure that you have a light switch at the top of the stairs and the bottom of the stairs. If you do not have them, ask someone to add them for you. What else can I do to help prevent falls? Wear shoes that: Do not have high heels. Have rubber bottoms. Are comfortable and fit you  well. Are closed at the toe. Do not wear sandals. If you use a stepladder: Make sure that it is fully opened. Do not climb a closed stepladder. Make sure that both sides of the stepladder are locked into place. Ask someone to hold it for you, if possible. Clearly mark and make sure that you can see: Any grab bars or handrails. First and last steps. Where the edge of each step is. Use tools that help you move around (mobility aids) if they are needed. These include: Canes. Walkers. Scooters. Crutches. Turn on the lights when you go into a dark area. Replace any light bulbs as soon as they burn out. Set up your furniture so you have a clear path. Avoid moving your furniture around. If any of your floors are uneven, fix them. If there are any pets around you, be aware of where they are. Review your medicines with your doctor. Some medicines can make you feel dizzy. This can increase your chance of falling. Ask your doctor what other things that you can do to help prevent falls. This information is not intended to replace advice given to you by your health care provider. Make sure you discuss any questions you have with your health care provider. Document Released: 10/28/2008 Document Revised: 06/09/2015 Document Reviewed: 02/05/2014 Elsevier Interactive Patient Education  2017 [employer].

## 2022-02-22 ENCOUNTER — Encounter: Payer: Self-pay | Admitting: Hematology

## 2022-02-26 ENCOUNTER — Ambulatory Visit (INDEPENDENT_AMBULATORY_CARE_PROVIDER_SITE_OTHER): Payer: Medicare Other | Admitting: Family Medicine

## 2022-02-26 ENCOUNTER — Encounter: Payer: Self-pay | Admitting: Family Medicine

## 2022-02-26 VITALS — BP 154/60 | HR 70 | Temp 97.2°F | Ht 63.5 in | Wt 117.4 lb

## 2022-02-26 DIAGNOSIS — I700 Atherosclerosis of aorta: Secondary | ICD-10-CM

## 2022-02-26 DIAGNOSIS — Z0000 Encounter for general adult medical examination without abnormal findings: Secondary | ICD-10-CM | POA: Diagnosis not present

## 2022-02-26 DIAGNOSIS — R7303 Prediabetes: Secondary | ICD-10-CM

## 2022-02-26 DIAGNOSIS — J432 Centrilobular emphysema: Secondary | ICD-10-CM

## 2022-02-26 DIAGNOSIS — Z7189 Other specified counseling: Secondary | ICD-10-CM

## 2022-02-26 DIAGNOSIS — E7800 Pure hypercholesterolemia, unspecified: Secondary | ICD-10-CM

## 2022-02-26 DIAGNOSIS — Z23 Encounter for immunization: Secondary | ICD-10-CM

## 2022-02-26 DIAGNOSIS — Z720 Tobacco use: Secondary | ICD-10-CM

## 2022-02-26 DIAGNOSIS — C187 Malignant neoplasm of sigmoid colon: Secondary | ICD-10-CM

## 2022-02-26 DIAGNOSIS — R030 Elevated blood-pressure reading, without diagnosis of hypertension: Secondary | ICD-10-CM

## 2022-02-26 DIAGNOSIS — C772 Secondary and unspecified malignant neoplasm of intra-abdominal lymph nodes: Secondary | ICD-10-CM

## 2022-02-26 DIAGNOSIS — I771 Stricture of artery: Secondary | ICD-10-CM | POA: Diagnosis not present

## 2022-02-26 DIAGNOSIS — I708 Atherosclerosis of other arteries: Secondary | ICD-10-CM

## 2022-02-26 MED ORDER — ROSUVASTATIN CALCIUM 5 MG PO TABS: 5.0000 mg | ORAL_TABLET | Freq: Every day | ORAL | 4 refills | Status: DC

## 2022-02-26 NOTE — Progress Notes (Signed)
Patient ID: Mario Mcdowell, male    DOB: Sep 23, 1950, 72 y.o.   MRN: [id number]  This visit was conducted in person.  BP (!) 154/60 (BP Location: Right Arm, Cuff Size: Normal)   Pulse 70   Temp (!) 97.2 F (36.2 C) (Temporal)   Ht 5' 3.5" (1.613 m)   Wt 117 lb 6 oz (53.2 kg)   SpO2 97%   BMI 20.47 kg/m    CC: CPE Subjective:   HPI: Mario Mcdowell is a 72 y.o. male presenting on 02/26/2022 for Annual Exam (MCR prt 2 [AWV- [birth year]].)   Saw health advisor last week for medicare wellness visit. Note reviewed.   No results found.  [place] Office Visit from 02/26/2022 in [place] at [place] [facility]  PHQ-2 Total Score 0          02/26/2022    9:31 AM 02/19/2022    8:34 AM 02/17/2021    8:26 AM 02/17/2020    8:13 AM 01/19/2019    2:57 PM  Fall Risk   Falls in the past year? 0 0 0 0 0  Number falls in past yr:  0 0 0   Injury with Fall?  0 0 0   Risk for fall due to :   No Fall Risks No Fall Risks   Follow up  Falls evaluation completed;Falls prevention discussed Falls prevention discussed Falls evaluation completed;Falls prevention discussed    Known stage IIIB colon cancer s/p hemicolectomy with FOLFOX in remission. 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 Planned surveillance with Q6 mo CT.  Sees [facility] yearly, last 03/2021.  Continued 3/4 ppd smoker.    Known aortoiliac occlusive disease PAD with buttock claudication - declined revascularization - as well as carotid bruits. Followed by Dr Scot Dock.    BP elevated today - he just had sausage mcmuffin at [place].   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  Colonoscopy 07/2019 - rpt 2 yrs [facility] Plan) Colonoscopy 10/2021 - TAs, healthy anastomosis, rpt 3 yrs  [facility] Plan) Prostate cancer screening - PSA reassuring. h/o BPH.  Lung cancer screening - see above, followed by pulmonology for lung masses - yearly chest CT scan.  Flu shot - yearly [place] 03/2019 x2, booster 12/2019, bivalent booster 01/2021 Prevnar-13 01/2019, pneumovax 02/2020 Tetanus shot - declines  Shingles shot - discussed, declines  RSV - discussed, declines  Advanced directive discussion - brought 02/2021, scanned. Daughter Mario Mcdowell and son Mario Mcdowell are [place]. 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 Would be ok with trial for reversible condition and with feeding tube. Wants HCPOA to take precedence over living will. Seat belt use discussed.  Sunscreen use discussed, no changing moles on skin.  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 Drinks 1 gallon milk/day. Fruits/vegetables daily.      Relevant past medical, surgical, family and social history reviewed and updated as indicated. Interim medical history since our  last visit reviewed. Allergies and medications reviewed and updated. Outpatient Medications Prior to Visit  Medication Sig Dispense Refill   aspirin EC 81 MG tablet Take 81 mg by mouth daily.     rosuvastatin (CRESTOR) 5 MG tablet Take 1 tablet (5 mg total) by mouth daily. 90 tablet 3   ibuprofen (ADVIL) 200 MG tablet Take 400 mg by mouth every 6 (six) hours as needed for moderate pain.     mupirocin ointment (BACTROBAN) 2 % Apply 1 application. topically 2 (two) times daily. To affected area on left external ear 15 g 0   No facility-administered medications prior to visit.     Per HPI unless specifically indicated in  ROS section below Review of Systems  Constitutional:  Negative for activity change, appetite change, chills, fatigue, fever and unexpected weight change.  HENT:  Negative for hearing loss.   Eyes:  Negative for visual disturbance.  Respiratory:  Negative for cough, chest tightness, shortness of breath and wheezing.   Cardiovascular:  Negative for chest pain, palpitations and leg swelling.  Gastrointestinal:  Negative for abdominal distention, abdominal pain, blood in stool, constipation, diarrhea, nausea and vomiting.  Genitourinary:  Negative for difficulty urinating and hematuria.  Musculoskeletal:  Negative for arthralgias, myalgias and neck pain.  Skin:  Negative for rash.  Neurological:  Negative for dizziness, seizures, syncope and headaches.  Hematological:  Negative for adenopathy. Does not bruise/bleed easily.  Psychiatric/Behavioral:  Negative for dysphoric mood. The patient is not nervous/anxious.     Objective:  BP (!) 154/60 (BP Location: Right Arm, Cuff Size: Normal)   Pulse 70   Temp (!) 97.2 F (36.2 C) (Temporal)   Ht 5' 3.5" (1.613 m)   Wt 117 lb 6 oz (53.2 kg)   SpO2 97%   BMI 20.47 kg/m   Wt Readings from Last 3 Encounters:  02/26/22 117 lb 6 oz (53.2 kg)  02/19/22 115 lb (52.2 kg)  10/18/21 115 lb (52.2 kg)      Physical Exam Vitals and nursing note reviewed.  Constitutional:      General: He is not in acute distress.    Appearance: Normal appearance. He is well-developed. He is not ill-appearing.  HENT:     Head: Normocephalic and atraumatic.     Right Ear: Hearing, tympanic membrane, ear canal and external ear normal.     Left Ear: Hearing, tympanic membrane, ear canal and external ear normal.     Nose: Nose normal.     Mouth/Throat:     Mouth: Mucous membranes are moist.     Pharynx: Oropharynx is clear. No oropharyngeal exudate or posterior oropharyngeal erythema.  Eyes:     General: No scleral icterus.    Extraocular Movements: Extraocular  movements intact.     Conjunctiva/sclera: Conjunctivae normal.     Pupils: Pupils are equal, round, and reactive to light.  Neck:     Thyroid: No thyroid mass or thyromegaly.     Vascular: Carotid bruit (L>R) present.  Cardiovascular:     Rate and Rhythm: Normal rate and regular rhythm.     Pulses: Normal pulses.          Radial pulses are 2+ on the right side and 2+ on the left side.     Heart sounds: Normal heart sounds. No murmur heard. Pulmonary:     Effort: Pulmonary effort is normal. No respiratory distress.     Breath sounds: Normal breath sounds. No wheezing, rhonchi or rales.  Abdominal:  General: Bowel sounds are normal. There is no distension.     Palpations: Abdomen is soft. There is no mass.     Tenderness: There is no abdominal tenderness. There is no guarding or rebound.     Hernia: No hernia is present.  Musculoskeletal:        General: Normal range of motion.     Cervical back: Normal range of motion and neck supple.     Right lower leg: No edema.     Left lower leg: No edema.  Lymphadenopathy:     Cervical: No cervical adenopathy.  Skin:    General: Skin is warm and dry.     Findings: No rash.  Neurological:     General: No focal deficit present.     Mental Status: He is alert and oriented to person, place, and time.  Psychiatric:        Mood and Affect: Mood normal.        Behavior: Behavior normal.        Thought Content: Thought content normal.        Judgment: Judgment normal.       Results for orders placed or performed in visit on 02/19/22  PSA  Result Value Ref Range   PSA 0.64 0.10 - 4.00 ng/mL  CBC with Differential/Platelet  Result Value Ref Range   WBC 7.9 4.0 - 10.5 K/uL   RBC 4.82 4.22 - 5.81 Mil/uL   Hemoglobin 15.8 13.0 - 17.0 g/dL   HCT 46.0 39.0 - 52.0 %   MCV 95.5 78.0 - 100.0 fl   MCHC 34.3 30.0 - 36.0 g/dL   RDW 14.2 11.5 - 15.5 %   Platelets 168.0 150.0 - 400.0 K/uL   Neutrophils Relative % 43.9 43.0 - 77.0 %    Lymphocytes Relative 47.7 (H) 12.0 - 46.0 %   Monocytes Relative 7.0 3.0 - 12.0 %   Eosinophils Relative 1.1 0.0 - 5.0 %   Basophils Relative 0.3 0.0 - 3.0 %   Neutro Abs 3.5 1.4 - 7.7 K/uL   Lymphs Abs 3.8 0.7 - 4.0 K/uL   Monocytes Absolute 0.6 0.1 - 1.0 K/uL   Eosinophils Absolute 0.1 0.0 - 0.7 K/uL   Basophils Absolute 0.0 0.0 - 0.1 K/uL  Hemoglobin A1c  Result Value Ref Range   Hgb A1c MFr Bld 6.2 4.6 - 6.5 %  Comprehensive metabolic panel  Result Value Ref Range   Sodium 137 135 - 145 mEq/L   Potassium 4.5 3.5 - 5.1 mEq/L   Chloride 103 96 - 112 mEq/L   CO2 27 19 - 32 mEq/L   Glucose, Bld 78 70 - 99 mg/dL   BUN 16 6 - 23 mg/dL   Creatinine, Ser 0.90 0.40 - 1.50 mg/dL   Total Bilirubin 0.4 0.2 - 1.2 mg/dL   Alkaline Phosphatase 75 39 - 117 U/L   AST 20 0 - 37 U/L   ALT 21 0 - 53 U/L   Total Protein 7.0 6.0 - 8.3 g/dL   Albumin 4.4 3.5 - 5.2 g/dL   GFR 85.93 >60.00 mL/min   Calcium 9.1 8.4 - 10.5 mg/dL  Lipid panel  Result Value Ref Range   Cholesterol 167 0 - 200 mg/dL   Triglycerides 117.0 0.0 - 149.0 mg/dL   HDL 37.30 (L) >39.00 mg/dL   VLDL 23.4 0.0 - 40.0 mg/dL   LDL Cholesterol 106 (H) 0 - 99 mg/dL   Total CHOL/HDL Ratio 4    NonHDL 129.74  Assessment & Plan:   Problem List Items Addressed This Visit     Advanced care planning/counseling discussion (Chronic)    Previously discussed      Health maintenance examination - Primary (Chronic)    Preventative protocols reviewed and updated unless pt declined. Discussed healthy diet and lifestyle.       Aorto-iliac atherosclerosis (HCC)    Continue aspirin, statin, [facility] f/u      Relevant Medications   rosuvastatin (CRESTOR) 5 MG tablet   Tobacco abuse    Continues 3/4 ppd.  Continue to encourage cessation. Precontemplative.      HLD (hyperlipidemia)    Chronic, stable on low dose rosuvastatin - continue. 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       Prediabetes    Encouraged limiting added sugar in diet.       Centrilobular emphysema (HCC)    Stable period off respiratory medications. Continues smoking - precontemplative.       Subclavian artery stenosis, left (HCC)    By carotid US, with L>R bruit. Followed by [facility].  No discrepancy between BP readings bilateral arms.       Relevant Medications   rosuvastatin (CRESTOR) 5 MG tablet   Elevated blood pressure reading in office without diagnosis of hypertension    Elevation noted today - in setting of recently eating sausage mcmuffin.  DASH diet handout provided as well as reviewed diet and lifestyle choices to help control blood pressures. He will start monitoring blood pressures at home and let us know if consistently >140/90 to discuss antihypertensive medication.       Other Visit Diagnoses     Need for influenza vaccination       Relevant Orders   Flu Vaccine QUAD High Dose(Fluad) (Completed)        Meds ordered this encounter  Medications   rosuvastatin (CRESTOR) 5 MG tablet    Sig: Take 1 tablet (5 mg total) by mouth daily.    Dispense:  90 tablet    Refill:  4    Orders Placed This Encounter  Procedures   Flu Vaccine QUAD High Dose(Fluad)    Patient Instructions  Flu shot today  BP was staying too high - start monitoring at home, let me know if consistently >140/90 to discuss medication.  Work on low salt/sodium diet - goal <2gm  (2038m) per day. Eat a diet high in fruits/vegetables and whole grains.  Look into mediterranean and DASH diet. Goal activity is 1589m/wk of moderate intensity exercise.  This can be split into 30 minute chunks.  If you are not at this level, you can start with smaller 10-15 min increments and slowly build up activity. Look at wwEatonrg for more resources  DASH Eating Plan DASH stands for Dietary Approaches to Stop Hypertension. The DASH eating plan is a healthy eating plan that has been shown to: Reduce high blood pressure (hypertension). Reduce your risk for type 2 diabetes, heart disease, and stroke. Help with weight loss. What are tips for following this plan? Reading food labels Check food labels for the amount of salt (sodium) per serving. Choose foods with less than 5 percent of the Daily Value of sodium. Generally, foods with less than 300 milligrams (mg) of sodium per serving fit into this eating plan. To find whole grains, look for the word "whole" as the first word in the ingredient list. Shopping Buy products labeled as "low-sodium" or "no salt added." Buy fresh foods. Avoid canned foods and pre-made or frozen meals. Cooking Avoid adding salt when cooking. Use salt-free seasonings or herbs instead of table salt or sea salt. Check with your health care provider or pharmacist before using salt substitutes. Do not fry foods. Cook foods using healthy methods such as baking, boiling, grilling, roasting, and broiling instead. Cook with heart-healthy oils, such as olive, canola, avocado, soybean, or sunflower oil. Meal planning  Eat a balanced diet that includes: 4 or more servings of fruits and 4 or more servings of vegetables each day. Try to fill one-half of your plate with fruits and vegetables. 6-8 servings of whole grains each day. Less than 6 oz (170 g) of lean meat, poultry, or fish each day. A 3-oz (85-g) serving of meat is about the same size as a deck of cards. One  egg equals 1 oz (28 g). 2-3 servings of low-fat dairy each day. One serving is 1 cup (237 mL). 1 serving of nuts, seeds, or beans 5 times each week. 2-3 servings of heart-healthy fats. Healthy fats called omega-3 fatty acids are found in foods such as walnuts, flaxseeds, fortified milks, and eggs. These fats are also found in cold-water fish, such as sardines, salmon, and mackerel. Limit how much you eat of: Canned or prepackaged foods. Food that is high in trans fat, such as some fried foods. Food that is high in saturated fat, such as fatty meat. Desserts and other sweets, sugary drinks, and other foods with added sugar. Full-fat dairy products. Do not salt foods before eating. Do not eat more than 4 egg yolks a week. Try to eat at least 2 vegetarian meals a week. Eat more home-cooked food and less restaurant, buffet, and fast food. Lifestyle When eating at a restaurant, ask that your food be prepared with less salt or no salt, if possible. If you drink alcohol: Limit how much you use to: 0-1 drink a day for women who are not pregnant. 0-2 drinks a day for men. Be aware of how much alcohol is in your drink. In the [place] one drink equals one 12 oz bottle of beer (355 mL), one 5 oz glass of wine (148 mL), or one 1 oz glass of hard liquor (44 mL). General information Avoid eating more than 2,300 mg of salt a day. If you have hypertension, you may need to reduce your sodium intake to 1,500 mg a day. Work with your health care provider to maintain a healthy body weight or to lose weight. Ask what an ideal weight is for you. Get at least 30 minutes of exercise that causes your heart to beat faster (aerobic exercise) most days of the week. Activities may include walking, swimming, or biking. Work with your health care provider or dietitian to  adjust your eating plan to your individual calorie needs. What foods should I eat? Fruits All fresh, dried, or frozen fruit. Canned fruit in natural  juice (without added sugar). Vegetables Fresh or frozen vegetables (raw, steamed, roasted, or grilled). Low-sodium or reduced-sodium tomato and vegetable juice. Low-sodium or reduced-sodium tomato sauce and tomato paste. Low-sodium or reduced-sodium canned vegetables. Grains Whole-grain or whole-wheat bread. Whole-grain or whole-wheat pasta. Brown rice. [place]. Bulgur. Whole-grain and low-sodium cereals. Pita bread. Low-fat, low-sodium crackers. Whole-wheat flour tortillas. Meats and other proteins Skinless chicken or [place]. Ground chicken or [place]. Pork with fat trimmed off. Fish and seafood. Egg whites. Dried beans, peas, or lentils. Unsalted nuts, nut butters, and seeds. Unsalted canned beans. Lean cuts of beef with fat trimmed off. Low-sodium, lean precooked or cured meat, such as sausages or meat loaves. Dairy Low-fat (1%) or fat-free (skim) milk. Reduced-fat, low-fat, or fat-free cheeses. Nonfat, low-sodium ricotta or cottage cheese. Low-fat or nonfat yogurt. Low-fat, low-sodium cheese. Fats and oils Soft margarine without trans fats. Vegetable oil. Reduced-fat, low-fat, or light mayonnaise and salad dressings (reduced-sodium). Canola, safflower, olive, avocado, soybean, and sunflower oils. Avocado. Seasonings and condiments Herbs. Spices. Seasoning mixes without salt. Other foods Unsalted popcorn and pretzels. Fat-free sweets. The items listed above may not be a complete list of foods and beverages you can eat. Contact a dietitian for more information. What foods should I avoid? Fruits Canned fruit in a light or heavy syrup. Fried fruit. Fruit in cream or butter sauce. Vegetables Creamed or fried vegetables. Vegetables in a cheese sauce. Regular canned vegetables (not low-sodium or reduced-sodium). Regular canned tomato sauce and paste (not low-sodium or reduced-sodium). Regular tomato and vegetable juice (not low-sodium or reduced-sodium). Angie Fava. Olives. Grains Baked goods  made with fat, such as croissants, muffins, or some breads. Dry pasta or rice meal packs. Meats and other proteins Fatty cuts of meat. Ribs. Fried meat. Berniece Salines. Bologna, salami, and other precooked or cured meats, such as sausages or meat loaves. Fat from the back of a pig (fatback). Bratwurst. Salted nuts and seeds. Canned beans with added salt. Canned or smoked fish. Whole eggs or egg yolks. Chicken or [place] with skin. Dairy Whole or 2% milk, cream, and half-and-half. Whole or full-fat cream cheese. Whole-fat or sweetened yogurt. Full-fat cheese. Nondairy creamers. Whipped toppings. Processed cheese and cheese spreads. Fats and oils Butter. Stick margarine. Lard. Shortening. Ghee. Bacon fat. Tropical oils, such as coconut, palm kernel, or palm oil. Seasonings and condiments Onion salt, garlic salt, seasoned salt, table salt, and sea salt. Worcestershire sauce. Tartar sauce. Barbecue sauce. Teriyaki sauce. Soy sauce, including reduced-sodium. Steak sauce. Canned and packaged gravies. Fish sauce. Oyster sauce. Cocktail sauce. Store-bought horseradish. Ketchup. Mustard. Meat flavorings and tenderizers. Bouillon cubes. Hot sauces. Pre-made or packaged marinades. Pre-made or packaged taco seasonings. Relishes. Regular salad dressings. Other foods Salted popcorn and pretzels. The items listed above may not be a complete list of foods and beverages you should avoid. Contact a dietitian for more information. Where to find more information National Heart, Lung, and Blood Institute: https://wilson-eaton.com/ American Heart Association: www.heart.org Academy of Nutrition and Dietetics: www.eatright.[place]: www.kidney.org Summary The DASH eating plan is a healthy eating plan that has been shown to reduce high blood pressure (hypertension). It may also reduce your risk for type 2 diabetes, heart disease, and stroke. When on the DASH eating plan, aim to eat more fresh fruits and vegetables,  whole grains, lean proteins, low-fat dairy, and heart-healthy fats. With the  DASH eating plan, you should limit salt (sodium) intake to 2,300 mg a day. If you have hypertension, you may need to reduce your sodium intake to 1,500 mg a day. Work with your health care provider or dietitian to adjust your eating plan to your individual calorie needs. This information is not intended to replace advice given to you by your health care provider. Make sure you discuss any questions you have with your health care provider. Document Revised: 12/05/2018 Document Reviewed: 12/05/2018 Elsevier Patient Education  [place].   Follow up plan: Return in about 1 year (around 02/27/2023) for annual exam, prior fasting for blood work, medicare wellness visit.  Ria Bush, MD

## 2022-02-26 NOTE — Assessment & Plan Note (Signed)
Preventative protocols reviewed and updated unless pt declined. Discussed healthy diet and lifestyle.  

## 2022-02-26 NOTE — Patient Instructions (Addendum)
Flu shot today  BP was staying too high - start monitoring at home, let me know if consistently >140/90 to discuss medication.  Work on low salt/sodium diet - goal <2gm (2025m) per day. Eat a diet high in fruits/vegetables and whole grains.  Look into mediterranean and DASH diet. Goal activity is 1557m/wk of moderate intensity exercise.  This can be split into 30 minute chunks.  If you are not at this level, you can start with smaller 10-15 min increments and slowly build up activity. Look at [facility] for more resources   DASH Eating Plan DASH stands for Dietary Approaches to Stop Hypertension. The DASH eating plan is a healthy eating plan that has been shown to: Reduce high blood pressure (hypertension). Reduce your risk for type 2 diabetes, heart disease, and stroke. Help with weight loss. What are tips for following this plan? Reading food labels Check food labels for the amount of salt (sodium) per serving. Choose foods with less than 5 percent of the Daily Value of sodium. Generally, foods with less than 300 milligrams (mg) of sodium per serving fit into this eating plan. To find whole grains, look for the word "whole" as the first word in the ingredient list. Shopping Buy products labeled as "low-sodium" or "no salt added." Buy fresh foods. Avoid canned foods and pre-made or frozen meals. Cooking Avoid adding salt when cooking. Use salt-free seasonings or herbs instead of table salt or sea salt. Check with your health care provider or pharmacist before using salt substitutes. Do not fry foods. Cook foods using healthy methods such as baking, boiling, grilling, roasting, and broiling instead. Cook with heart-healthy oils, such as olive, canola, avocado, soybean, or sunflower oil. Meal planning  Eat a balanced diet that includes: 4 or more servings of fruits and 4 or more servings of vegetables each day. Try to fill one-half of your plate with fruits and vegetables. 6-8 servings  of whole grains each day. Less than 6 oz (170 g) of lean meat, poultry, or fish each day. A 3-oz (85-g) serving of meat is about the same size as a deck of cards. One egg equals 1 oz (28 g). 2-3 servings of low-fat dairy each day. One serving is 1 cup (237 mL). 1 serving of nuts, seeds, or beans 5 times each week. 2-3 servings of heart-healthy fats. Healthy fats called omega-3 fatty acids are found in foods such as walnuts, flaxseeds, fortified milks, and eggs. These fats are also found in cold-water fish, such as sardines, salmon, and mackerel. Limit how much you eat of: Canned or prepackaged foods. Food that is high in trans fat, such as some fried foods. Food that is high in saturated fat, such as fatty meat. Desserts and other sweets, sugary drinks, and other foods with added sugar. Full-fat dairy products. Do not salt foods before eating. Do not eat more than 4 egg yolks a week. Try to eat at least 2 vegetarian meals a week. Eat more home-cooked food and less restaurant, buffet, and fast food. Lifestyle When eating at a restaurant, ask that your food be prepared with less salt or no salt, if possible. If you drink alcohol: Limit how much you use to: 0-1 drink a day for women who are not pregnant. 0-2 drinks a day for men. Be aware of how much alcohol is in your drink. In the [place] one drink equals one 12 oz bottle of beer (355 mL), one 5 oz glass of wine (148 mL), or one 1  oz glass of hard liquor (44 mL). General information Avoid eating more than 2,300 mg of salt a day. If you have hypertension, you may need to reduce your sodium intake to 1,500 mg a day. Work with your health care provider to maintain a healthy body weight or to lose weight. Ask what an ideal weight is for you. Get at least 30 minutes of exercise that causes your heart to beat faster (aerobic exercise) most days of the week. Activities may include walking, swimming, or biking. Work with your health care provider or  dietitian to adjust your eating plan to your individual calorie needs. What foods should I eat? Fruits All fresh, dried, or frozen fruit. Canned fruit in natural juice (without added sugar). Vegetables Fresh or frozen vegetables (raw, steamed, roasted, or grilled). Low-sodium or reduced-sodium tomato and vegetable juice. Low-sodium or reduced-sodium tomato sauce and tomato paste. Low-sodium or reduced-sodium canned vegetables. Grains Whole-grain or whole-wheat bread. Whole-grain or whole-wheat pasta. Brown rice. [place]. Bulgur. Whole-grain and low-sodium cereals. Pita bread. Low-fat, low-sodium crackers. Whole-wheat flour tortillas. Meats and other proteins Skinless chicken or [place]. Ground chicken or [place]. Pork with fat trimmed off. Fish and seafood. Egg whites. Dried beans, peas, or lentils. Unsalted nuts, nut butters, and seeds. Unsalted canned beans. Lean cuts of beef with fat trimmed off. Low-sodium, lean precooked or cured meat, such as sausages or meat loaves. Dairy Low-fat (1%) or fat-free (skim) milk. Reduced-fat, low-fat, or fat-free cheeses. Nonfat, low-sodium ricotta or cottage cheese. Low-fat or nonfat yogurt. Low-fat, low-sodium cheese. Fats and oils Soft margarine without trans fats. Vegetable oil. Reduced-fat, low-fat, or light mayonnaise and salad dressings (reduced-sodium). Canola, safflower, olive, avocado, soybean, and sunflower oils. Avocado. Seasonings and condiments Herbs. Spices. Seasoning mixes without salt. Other foods Unsalted popcorn and pretzels. Fat-free sweets. The items listed above may not be a complete list of foods and beverages you can eat. Contact a dietitian for more information. What foods should I avoid? Fruits Canned fruit in a light or heavy syrup. Fried fruit. Fruit in cream or butter sauce. Vegetables Creamed or fried vegetables. Vegetables in a cheese sauce. Regular canned vegetables (not low-sodium or reduced-sodium). Regular canned  tomato sauce and paste (not low-sodium or reduced-sodium). Regular tomato and vegetable juice (not low-sodium or reduced-sodium). Angie Fava. Olives. Grains Baked goods made with fat, such as croissants, muffins, or some breads. Dry pasta or rice meal packs. Meats and other proteins Fatty cuts of meat. Ribs. Fried meat. Berniece Salines. Bologna, salami, and other precooked or cured meats, such as sausages or meat loaves. Fat from the back of a pig (fatback). Bratwurst. Salted nuts and seeds. Canned beans with added salt. Canned or smoked fish. Whole eggs or egg yolks. Chicken or [place] with skin. Dairy Whole or 2% milk, cream, and half-and-half. Whole or full-fat cream cheese. Whole-fat or sweetened yogurt. Full-fat cheese. Nondairy creamers. Whipped toppings. Processed cheese and cheese spreads. Fats and oils Butter. Stick margarine. Lard. Shortening. Ghee. Bacon fat. Tropical oils, such as coconut, palm kernel, or palm oil. Seasonings and condiments Onion salt, garlic salt, seasoned salt, table salt, and sea salt. Worcestershire sauce. Tartar sauce. Barbecue sauce. Teriyaki sauce. Soy sauce, including reduced-sodium. Steak sauce. Canned and packaged gravies. Fish sauce. Oyster sauce. Cocktail sauce. Store-bought horseradish. Ketchup. Mustard. Meat flavorings and tenderizers. Bouillon cubes. Hot sauces. Pre-made or packaged marinades. Pre-made or packaged taco seasonings. Relishes. Regular salad dressings. Other foods Salted popcorn and pretzels. The items listed above may not be a complete list of foods  and beverages you should avoid. Contact a dietitian for more information. Where to find more information National Heart, Lung, and Blood Institute: https://wilson-eaton.com/ American Heart Association: www.heart.org Academy of Nutrition and Dietetics: www.eatright.[place]: www.kidney.org Summary The DASH eating plan is a healthy eating plan that has been shown to reduce high blood pressure  (hypertension). It may also reduce your risk for type 2 diabetes, heart disease, and stroke. When on the DASH eating plan, aim to eat more fresh fruits and vegetables, whole grains, lean proteins, low-fat dairy, and heart-healthy fats. With the DASH eating plan, you should limit salt (sodium) intake to 2,300 mg a day. If you have hypertension, you may need to reduce your sodium intake to 1,500 mg a day. Work with your health care provider or dietitian to adjust your eating plan to your individual calorie needs. This information is not intended to replace advice given to you by your health care provider. Make sure you discuss any questions you have with your health care provider. Document Revised: 12/05/2018 Document Reviewed: 12/05/2018 Elsevier Patient Education  [place].

## 2022-02-26 NOTE — Assessment & Plan Note (Addendum)
Previously discussed.

## 2022-02-26 NOTE — Assessment & Plan Note (Signed)
Continue aspirin, statin, [facility] f/u

## 2022-02-26 NOTE — Assessment & Plan Note (Signed)
Stable period off respiratory medications. Continues smoking - precontemplative.

## 2022-02-26 NOTE — Assessment & Plan Note (Addendum)
By carotid US, with L>R bruit. Followed by [facility].  No discrepancy between BP readings bilateral arms.

## 2022-02-26 NOTE — Assessment & Plan Note (Signed)
Released from onc care after 5 years cancer free Continues seeing GI regularly, next colonoscopy will be due in 3 years.

## 2022-02-26 NOTE — Assessment & Plan Note (Signed)
Continues 3/4 ppd.  Continue to encourage cessation. Precontemplative.

## 2022-02-26 NOTE — Assessment & Plan Note (Addendum)
Chronic, stable on low dose rosuvastatin - continue. The 10-year ASCVD risk score (Arnett DK, et al., 2019) is: 31.1%   Values used to calculate the score:     Age: 72 years     Sex: Male     Is Non-Hispanic African American: No     Diabetic: No     Tobacco smoker: Yes     Systolic Blood Pressure: 123456 mmHg     Is BP treated: No     HDL Cholesterol: 37.3 mg/dL     Total Cholesterol: 167 mg/dL

## 2022-02-26 NOTE — Assessment & Plan Note (Signed)
Elevation noted today - in setting of recently eating sausage mcmuffin.  DASH diet handout provided as well as reviewed diet and lifestyle choices to help control blood pressures. He will start monitoring blood pressures at home and let us know if consistently >140/90 to discuss antihypertensive medication.

## 2022-02-26 NOTE — Assessment & Plan Note (Signed)
Encouraged limiting added sugar in diet.

## 2022-04-18 ENCOUNTER — Other Ambulatory Visit: Payer: Medicare Other

## 2022-05-01 ENCOUNTER — Other Ambulatory Visit: Payer: Self-pay | Admitting: *Deleted

## 2022-05-01 DIAGNOSIS — I739 Peripheral vascular disease, unspecified: Secondary | ICD-10-CM

## 2022-05-01 DIAGNOSIS — R0989 Other specified symptoms and signs involving the circulatory and respiratory systems: Secondary | ICD-10-CM

## 2022-05-03 ENCOUNTER — Ambulatory Visit
Admission: RE | Admit: 2022-05-03 | Discharge: 2022-05-03 | Disposition: A | Discharge status: Home / Self Care | Payer: Medicare Other | Source: Ambulatory Visit | Attending: Pulmonary Disease | Admitting: Pulmonary Disease

## 2022-05-03 ENCOUNTER — Encounter: Payer: Self-pay | Admitting: Hematology

## 2022-05-03 DIAGNOSIS — R918 Other nonspecific abnormal finding of lung field: Secondary | ICD-10-CM | POA: Diagnosis not present

## 2022-05-03 DIAGNOSIS — R911 Solitary pulmonary nodule: Secondary | ICD-10-CM | POA: Diagnosis not present

## 2022-05-03 DIAGNOSIS — I700 Atherosclerosis of aorta: Secondary | ICD-10-CM | POA: Diagnosis not present

## 2022-05-03 DIAGNOSIS — J984 Other disorders of lung: Secondary | ICD-10-CM

## 2022-05-03 DIAGNOSIS — J439 Emphysema, unspecified: Secondary | ICD-10-CM | POA: Diagnosis not present

## 2022-05-10 ENCOUNTER — Ambulatory Visit: Payer: Medicare Other | Admitting: Vascular Surgery

## 2022-05-10 ENCOUNTER — Encounter: Payer: Self-pay | Admitting: Vascular Surgery

## 2022-05-10 ENCOUNTER — Ambulatory Visit (INDEPENDENT_AMBULATORY_CARE_PROVIDER_SITE_OTHER)
Admission: RE | Admit: 2022-05-10 | Discharge: 2022-05-10 | Disposition: A | Discharge status: Home / Self Care | Payer: Medicare Other | Source: Ambulatory Visit | Attending: Vascular Surgery | Admitting: Vascular Surgery

## 2022-05-10 ENCOUNTER — Ambulatory Visit (HOSPITAL_COMMUNITY)
Admission: RE | Admit: 2022-05-10 | Discharge: 2022-05-10 | Disposition: A | Discharge status: Home / Self Care | Payer: Medicare Other | Source: Ambulatory Visit | Attending: Vascular Surgery | Admitting: Vascular Surgery

## 2022-05-10 ENCOUNTER — Encounter: Payer: Self-pay | Admitting: Hematology

## 2022-05-10 VITALS — BP 150/73 | HR 70 | Temp 98.1°F | Resp 20 | Ht 63.5 in | Wt 117.0 lb

## 2022-05-10 DIAGNOSIS — I70219 Atherosclerosis of native arteries of extremities with intermittent claudication, unspecified extremity: Secondary | ICD-10-CM

## 2022-05-10 DIAGNOSIS — R0989 Other specified symptoms and signs involving the circulatory and respiratory systems: Secondary | ICD-10-CM | POA: Insufficient documentation

## 2022-05-10 DIAGNOSIS — I739 Peripheral vascular disease, unspecified: Secondary | ICD-10-CM

## 2022-05-10 DIAGNOSIS — I7409 Other arterial embolism and thrombosis of abdominal aorta: Secondary | ICD-10-CM

## 2022-05-10 LAB — VAS US ABI WITH/WO TBI
Left ABI: 0.47
Right ABI: 0.62

## 2022-05-10 NOTE — Patient Instructions (Signed)
This patient has a known

## 2022-05-10 NOTE — Progress Notes (Signed)
REASON FOR VISIT:   Follow-up of aortoiliac occlusive disease.  MEDICAL ISSUES:   AORTOILIAC OCCLUSIVE DISEASE: This patient has a known aortic occlusion.  He has stable buttock claudication bilaterally.  He does remain fairly active.  He continues to smoke a pack per day.  He is on aspirin and is on a statin.  I encouraged him to stay as active as possible.  I explained we would only consider aortofemoral bypass grafting if he develops critical limb ischemia with rest pain or nonhealing ulcer.  I have recommended follow-up ABIs in 1 year and we will see him back at that time.  I explained that I will be retiring.   BILATERAL CAROTID BRUITS: Patient does have bilateral carotid bruits.  However his carotid duplex scan today shows no evidence of significant carotid stenosis on either side.  He is asymptomatic.  I do not think he needs routine follow-up studies.  He is on aspirin and is on a statin.  HPI:   Mario Mcdowell is a pleasant 72 y.o. male who I last saw on 04/06/2021.  He has a known aortic occlusion.  I have also been following a moderate 40 to 59% left carotid stenosis.  When I saw him last, he had stable buttock claudication.  I encouraged him to stay as active as possible.  We again discussed the importance of tobacco cessation.  I explained that we would only consider aortobifemoral bypass grafting if he developed critical limb ischemia.  I set him up for ABIs in 1 year and a follow-up visit.  In addition the patient had a history of bilateral carotid bruits.  I recommended a duplex scan at his next visit.  He was asymptomatic.  He was on aspirin and was on a statin.  Since I saw him last he continues to have stable buttock claudication bilaterally.  He also describes paresthesias in his legs when he is standing.  He does have some back pain.  I suspect he has some degenerative disc disease of his back.  I do not get any history of rest pain or nonhealing ulcers.  He denies any  history of stroke, TIAs, expressive or receptive aphasia, or amaurosis fugax.  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 partial colectomy Maisie Fus) 05/21/2016 and chemotherapy is planned   Full dentures    History of kidney stones 07/2015   passed without intervention   Hyperlipidemia    Pulmonary nodules    per chest CT 04-23-2016 bilateral nodules (consistent w/ granulomatous disease)   Smokers' cough    Wears contact lenses     Family History  Problem Relation Age of Onset   Cancer Paternal Aunt        unsure details   Diabetes Cousin    Cancer Paternal Uncle        unknow type cancer    Prostate cancer Neg Hx    Kidney cancer Neg Hx    CAD Neg Hx    Stroke Neg Hx    Colon cancer Neg  Hx    Esophageal cancer Neg Hx    Stomach cancer Neg Hx    Rectal cancer Neg Hx     SOCIAL HISTORY: Social History   Tobacco Use   Smoking status: Every Day    Packs/day: 1.00    Years: 46.00    Additional pack years: 0.00    Total pack years: 46.00    Types: Cigarettes   Smokeless tobacco: Never   Tobacco comments:    3/4 pack daily  Substance Use Topics   Alcohol use: No    Comment: quit 2000    Allergies  Allergen Reactions   Penicillins Swelling    Did it involve swelling of the face/tongue/throat, SOB, or low BP? Yes Did it involve sudden or severe rash/hives, skin peeling, or any reaction on the inside of your mouth or nose? No Did you need to seek medical attention at a hospital or doctor's office? No When did  it last happen?      childhood allergy If all above answers are "NO", may proceed with cephalosporin use.     Lipitor [Atorvastatin] Nausea Only    Current Outpatient Medications  Medication Sig Dispense Refill   aspirin EC 81 MG tablet Take 81 mg by mouth daily.     rosuvastatin (CRESTOR) 5 MG tablet Take 1 tablet (5 mg total) by mouth daily. 90 tablet 4   No current facility-administered medications for this visit.    REVIEW OF SYSTEMS:   denotes positive finding,  denotes negative finding Cardiac  Comments:  Chest pain or chest pressure:    Shortness of breath upon exertion: x   Short of breath when lying flat:    Irregular heart rhythm:        Vascular    Pain in calf, thigh, or hip brought on by ambulation:    Pain in feet at night that wakes you up from your sleep:     Blood clot in your veins:    Leg swelling:         Pulmonary    Oxygen at home:    Productive cough:     Wheezing:         Neurologic    Sudden weakness in arms or legs:     Sudden numbness in arms or legs:     Sudden onset of difficulty speaking or slurred speech:    Temporary loss of vision in one eye:     Problems with dizziness:         Gastrointestinal    Blood in stool:     Vomited blood:         Genitourinary    Burning when urinating:     Blood in urine:        Psychiatric    Major depression:         Hematologic    Bleeding problems:    Problems with blood clotting too easily:        Skin    Rashes or ulcers:        Constitutional    Fever or chills:     PHYSICAL EXAM:   Vitals:   05/10/22 1311 05/10/22 1313  BP: 126/72 (!) 150/73  Pulse: 70   Resp: 20   Temp: 98.1 F (36.7 C)   SpO2: 95%   Weight: 117 lb (53.1 kg)   Height: 5' 3.5" (1.613 m)     GENERAL: The patient is a well-nourished male, in no acute distress.  The vital signs are documented above. CARDIAC: There is a regular rate and rhythm.  VASCULAR: He has bilateral carotid bruits. I cannot  palpate femoral, popliteal, or pedal pulses. PULMONARY: There is good air exchange bilaterally without wheezing or rales. ABDOMEN: Soft and non-tender with normal pitched bowel sounds.  MUSCULOSKELETAL: There are no major deformities or cyanosis. NEUROLOGIC: No focal weakness or paresthesias are detected. SKIN: There are no ulcers or rashes noted. PSYCHIATRIC: The patient has a normal affect.  DATA:    CAROTID DUPLEX: I have independently interpreted his carotid duplex scan today.  On the right side there is a less than 39% stenosis.  The right vertebral artery is patent with antegrade flow.  There is no subclavian artery stenosis.  On the left side there is a less than 39% stenosis.  Left vertebral artery has bidirectional flow.  The subclavian artery is patent with no significant stenosis.  ARTERIAL DOPPLER STUDY: I have independently interpreted his arterial Doppler study today.  On the right side there is a monophasic posterior tibial and dorsalis pedis signal.  ABIs 62%.  Previous ABI was 59%.  Toe pressures 43 mmHg.  On the left side there is a monophasic posterior tibial and dorsalis pedis signal.  ABI is 47%.  Previous ABI was 44%.  Toe pressures 36 mmHg.  Waverly Ferrari [facility] [phone]

## 2022-05-16 ENCOUNTER — Encounter: Payer: Self-pay | Admitting: Hematology

## 2022-05-16 ENCOUNTER — Ambulatory Visit (HOSPITAL_BASED_OUTPATIENT_CLINIC_OR_DEPARTMENT_OTHER): Payer: Medicare Other | Admitting: Pulmonary Disease

## 2022-05-18 ENCOUNTER — Encounter: Payer: Self-pay | Admitting: Hematology

## 2022-05-21 ENCOUNTER — Other Ambulatory Visit: Payer: Self-pay

## 2022-05-21 DIAGNOSIS — I739 Peripheral vascular disease, unspecified: Secondary | ICD-10-CM

## 2022-05-21 DIAGNOSIS — N200 Calculus of kidney: Secondary | ICD-10-CM

## 2022-05-23 ENCOUNTER — Ambulatory Visit
Admission: RE | Admit: 2022-05-23 | Discharge: 2022-05-23 | Disposition: A | Discharge status: Home / Self Care | Payer: Medicare Other | Source: Ambulatory Visit | Attending: Urology | Admitting: Urology

## 2022-05-23 ENCOUNTER — Ambulatory Visit: Payer: Medicare Other | Admitting: Urology

## 2022-05-23 ENCOUNTER — Ambulatory Visit
Admission: RE | Admit: 2022-05-23 | Discharge: 2022-05-23 | Disposition: A | Discharge status: Home / Self Care | Payer: Medicare Other | Attending: Urology | Admitting: Urology

## 2022-05-23 VITALS — BP 119/71 | HR 65 | Ht 65.0 in | Wt 115.1 lb

## 2022-05-23 DIAGNOSIS — N200 Calculus of kidney: Secondary | ICD-10-CM

## 2022-05-23 DIAGNOSIS — Z09 Encounter for follow-up examination after completed treatment for conditions other than malignant neoplasm: Secondary | ICD-10-CM

## 2022-05-23 DIAGNOSIS — Z87442 Personal history of urinary calculi: Secondary | ICD-10-CM | POA: Diagnosis not present

## 2022-05-23 NOTE — Progress Notes (Signed)
Mario Mcdowell,acting as a scribe for Vanna Scotland, MD.,have documented all relevant documentation on the behalf of Vanna Scotland, MD,as directed by  Vanna Scotland, MD while in the presence of Vanna Scotland, MD.  05/23/2022 11:56 AM   Mario Mcdowell [birth year] [id number]  Referring provider: Eustaquio Boyden, MD [address] [place],  [place] [address]  Chief Complaint  Patient presents with   Nephrolithiasis    Follow up    HPI: 72 year old male with a personal history of nephrolithiasis who returns today for a routine annual follow-up for kidney stones. Last year, imaging and CT scan revealed a 5mm stone in the right upper pole of the kidney. We discussed intervention versus observation. He opted for observation and returns today with a KUB which was personally reviewed. The stone is difficult to see on imaging today.   A CT of the chest was performed on 05/03/2022 showed that the previously identified stone in the right upper pole is no longer visualized. The images do carry down through the mid kidney and no stones are seen.   Today, he does not recall passing the stone and reports no urinary symptoms.   PMH: Past Medical History:  Diagnosis Date   Aorto-iliac atherosclerosis (HCC)    by CT; in bilateral legs    Arthritis    Atherosclerotic PVD with intermittent claudication (HCC)    BPH without obstruction/lower urinary tract symptoms 10/25/2015   Centrilobular emphysema (HCC)    per chest CT 04-23-2016   Colon adenocarcinoma [facility]) oncologist- dr Mosetta Putt   dx 05-21-2016 -- Stage IIIB (pT3,pN1,M0) , Invasive sigmoid adenocarcinoma well to moderately differentiated with metastatic intra-abdominal lymph node (1 out of 22), s/p lap. partial colectomy Maisie Fus) 05/21/2016 and chemotherapy is planned   Full dentures    History of kidney stones 07/2015   passed without intervention   Hyperlipidemia    Pulmonary nodules    per chest CT 04-23-2016 bilateral nodules  (consistent w/ granulomatous disease)   Smokers' cough (HCC)    Wears contact lenses     Surgical History: Past Surgical History:  Procedure Laterality Date   COLONOSCOPY  03/12/2016   4 polyps, sigmoid tumor biopsied (TVA w/ MICROSCOPIC FOCUS SUSPICIOUS FOR INVASION) pending surgical resection, rpt 1 yr Russella Dar)   COLONOSCOPY  05/2017   2 benign polyps, rpt 2 yrs Russella Dar)   COLONOSCOPY  07/2019   TAs, rpt 2 yrs Russella Dar)   COLONOSCOPY  10/2021   TAs, healthy anastomosis, rpt 3 yrs Russella Dar)   LAPAROSCOPIC PARTIAL COLECTOMY N/A 05/21/2016   colon cancer, 1/20 positive lymp nodes. LAPAROSCOPIC PARTIAL COLECTOMY, SPLENIC FLEXURE MOBILIZATION AND PROCTOSCOPY;  Romie Levee, MD)   MULTIPLE TOOTH EXTRACTIONS  [phone]   PORT-A-CATH REMOVAL Right 10/04/2016   Procedure: REMOVAL PORT-A-CATH;  Surgeon: Romie Levee, MD;  Location: [facility];  Service: General;  Laterality: Right;   PORTACATH PLACEMENT N/A 06/21/2016   Procedure: INSERTION PORT-A-CATH;  Surgeon: Romie Levee, MD;  Location: [facility];  Service: General;  Laterality: N/A;  right   VIDEO BRONCHOSCOPY WITH ENDOBRONCHIAL NAVIGATION N/A 11/14/2018   VIDEO BRONCHOSCOPY WITH ENDOBRONCHIAL NAVIGATION for R lung cavitary lesions;  Josephine Igo, DO    Home Medications:  Allergies as of 05/23/2022       Reactions   Penicillins Swelling   Did it involve swelling of the face/tongue/throat, SOB, or low BP? Yes Did it involve sudden or severe rash/hives, skin peeling, or any reaction on the inside of  your mouth or nose? No Did you need to seek medical attention at a hospital or doctor's office? No When did it last happen?      childhood allergy If all above answers are "NO", may proceed with cephalosporin use.   Lipitor [atorvastatin] Nausea Only        Medication List        Accurate as of May 23, 2022 11:56 AM. If you have any questions, ask your nurse or doctor.          aspirin EC 81  MG tablet Take 81 mg by mouth daily.   rosuvastatin 5 MG tablet Commonly known as: CRESTOR Take 1 tablet (5 mg total) by mouth daily.        Allergies:  Allergies  Allergen Reactions   Penicillins Swelling    Did it involve swelling of the face/tongue/throat, SOB, or low BP? Yes Did it involve sudden or severe rash/hives, skin peeling, or any reaction on the inside of your mouth or nose? No Did you need to seek medical attention at a hospital or doctor's office? No When did it last happen?      childhood allergy If all above answers are "NO", may proceed with cephalosporin use.     Lipitor [Atorvastatin] Nausea Only    Family History: Family History  Problem Relation Age of Onset   Cancer Paternal Aunt        unsure details   Diabetes Cousin    Cancer Paternal Uncle        unknow type cancer    Prostate cancer Neg Hx    Kidney cancer Neg Hx    CAD Neg Hx    Stroke Neg Hx    Colon cancer Neg Hx    Esophageal cancer Neg Hx    Stomach cancer Neg Hx    Rectal cancer Neg Hx     Social History:  reports that he has been smoking cigarettes. He has a 46.00 pack-year smoking history. He has never used smokeless tobacco. He reports that he does not drink alcohol and does not use drugs.   Physical Exam: BP 119/71   Pulse 65   Ht 5\' 5"  (1.651 m)   Wt 115 lb 2 oz (52.2 kg)   BMI 19.16 kg/m   Constitutional:  Alert and oriented, No acute distress. HEENT: [place] AT, moist mucus membranes.  Trachea midline, no masses. Neurologic: Grossly intact, no focal deficits, moving all 4 extremities. Psychiatric: Normal mood and affect.  Pertinent Imaging:  EXAM: CT CHEST WITHOUT CONTRAST   TECHNIQUE: Multidetector CT imaging of the chest was performed following the standard protocol without IV contrast.   RADIATION DOSE REDUCTION: This exam was performed according to the departmental dose-optimization program which includes automated exposure control, adjustment of the mA  and/or kV according to patient size and/or use of iterative reconstruction technique.   COMPARISON:  Most recent comparison 05/01/2021, additional prior exams reviewed.   FINDINGS: Cardiovascular: Aortic atherosclerosis. No aortic aneurysm. The heart is normal in size. There are coronary artery calcifications. No pericardial effusion.   Mediastinum/Nodes: Calcified mediastinal and bilateral hilar lymph nodes consistent with prior granulomatous disease. There is no noncalcified adenopathy. Decompressed esophagus.   Lungs/Pleura: Thick-walled cavitary process in the right upper lobe measures 3.8 x 4.1 cm, not significantly changed when compared to prior exam. This is contiguous with adjacent right apical opacities that extend both to the apex is well as laterally towards the pleura. Additional cavitary process in the lung  apex is similar to prior measuring approximately 2.3 x 1.6 cm, stable by my retrospective measurement. This process is not significantly changed from most recent prior, grossly stable in size dating back to at least 2021 exam. Multiple pulmonary nodules throughout the right upper lobe anteriorly, largest measuring 11 x 7 mm, series 5, image 77, unchanged from most recent prior, and overall slightly decreasing from more remote exams. There are multiple calcified granuloma throughout both lungs. No new or enlarging pulmonary nodules. No acute airspace disease. Moderate emphysema. No pleural fluid.   Upper Abdomen: Aortic and SMA atherosclerosis. No acute upper abdominal findings. Splenic calcified granuloma.   Musculoskeletal: There are no acute or suspicious osseous abnormalities. No focal bone lesions. No chest wall soft tissue abnormalities.   IMPRESSION: 1. Stable appearance of cavitary process in the right upper lobe, favoring a post infectious/inflammatory process. 2. Stable multiple noncalcified pulmonary nodules in the right upper lobe. 3. Sequela of  prior granulomatous disease with calcified granuloma throughout both lungs, calcified mediastinal and bilateral hilar lymph nodes, and splenic granuloma. 4. No new or enlarging nodules, no acute airspace disease. 5. Aortic Atherosclerosis (ICD10-I70.0) and Emphysema (ICD10-J43.9).     Electronically Signed   By: Narda Rutherford M.D.   On: 05/06/2022 21:05  This was personally reviewed and I agree with the radiologic interpretation.     Assessment & Plan:    1. History of kidney stones - Query whether he has had interval passage of the stone. It is no longer seen on CT. He does not have any obvious hydronephrosis or caliectasis on his most recent CT from just 20 days ago.  - We discussed general stone prevention techniques including drinking plenty water with goal of producing 2.5 L urine daily, increased citric acid intake, avoidance of high oxalate containing foods, and decreased salt intake.  Information about dietary recommendations given today.    Return if symptoms worsen or fail to improve.   [facility] [address] [place], [place] [address] [phone]

## 2022-05-31 ENCOUNTER — Ambulatory Visit (HOSPITAL_BASED_OUTPATIENT_CLINIC_OR_DEPARTMENT_OTHER): Payer: Medicare Other | Admitting: Pulmonary Disease

## 2022-05-31 ENCOUNTER — Encounter (HOSPITAL_BASED_OUTPATIENT_CLINIC_OR_DEPARTMENT_OTHER): Payer: Self-pay | Admitting: Pulmonary Disease

## 2022-05-31 VITALS — BP 132/60 | HR 66 | Ht 65.0 in | Wt 116.4 lb

## 2022-05-31 DIAGNOSIS — R911 Solitary pulmonary nodule: Secondary | ICD-10-CM | POA: Diagnosis not present

## 2022-05-31 DIAGNOSIS — F17210 Nicotine dependence, cigarettes, uncomplicated: Secondary | ICD-10-CM

## 2022-05-31 NOTE — Patient Instructions (Addendum)
Granulomatous Lung Cavitary lesions - stable --REFER to lung cancer screen program --If you develop respiratory symptoms before this, please contact our office and we can schedule imaging sooner  Emphysema --No indication for bronchodilators.  Tobacco abuse Patient is an active smoker. We discussed smoking cessation for 5 minutes. We discussed triggers and stressors and ways to deal with them. We discussed barriers to continued smoking and benefits of smoking cessation. Provided patient with information cessation techniques and interventions including [place] quitline.

## 2022-05-31 NOTE — Progress Notes (Signed)
Subjective:   PATIENT ID: Mario Mcdowell GENDER: male DOB: Jan 28, 1950, MRN: [id number]   HPI  Chief Complaint  Patient presents with   Follow-up    Unsure why he's here results are on mychart and say no changes    Reason for Visit: Follow-up CT  [name] is a 72 year old male current smoker with stage IIIB colon cancer s/p hemicolectomy s/p FOLFOX in remission who presents for follow-up for abnormal CT.  Synopsis: He underwent navigational bronchoscopy on 11/14/18. Results with no evidence of cancer. Focal non-caseating granuloma in the right upper lobe under surveillance  05/08/21 Denies shortness of breath, cough or wheezing. Continue to active smoker 1 ppd. He remains active at home. Able to perform heavy duty yardwork including digging and planting. Reports recent kidney stones with associated lower back pain and abdominal pain. Denies fevers, chills, cough, night sweats or unintentional weight loss. Not on any maintenance inhalers.  05/31/22 Continues to smoke 1 ppd. Denies shortness of breath, cough, wheezing. He is active at home including recently shoveling gravel, yardwork and home repairs. Has occasional minimal productive cough.  Social History: Active smoker. 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 travel, exposure to individuals with homelessness or jail/prison  Past Medical History:  Diagnosis Date   Aorto-iliac atherosclerosis (HCC)    by CT; in bilateral legs    Arthritis    Atherosclerotic PVD with intermittent claudication (HCC)    BPH without obstruction/lower urinary tract symptoms 10/25/2015   Centrilobular emphysema (HCC)    per chest CT 04-23-2016   Colon adenocarcinoma [facility]) oncologist- dr Mosetta Putt   dx 05-21-2016 -- Stage IIIB (pT3,pN1,M0) , Invasive sigmoid adenocarcinoma well to moderately differentiated with metastatic intra-abdominal lymph node (1 out of 22), s/p lap. partial colectomy Mario Mcdowell) 05/21/2016 and chemotherapy is  planned   Full dentures    History of kidney stones 07/2015   passed without intervention   Hyperlipidemia    Pulmonary nodules    per chest CT 04-23-2016 bilateral nodules (consistent w/ granulomatous disease)   Smokers' cough (HCC)    Wears contact lenses     Allergies  Allergen Reactions   Penicillins Swelling    Did it involve swelling of the face/tongue/throat, SOB, or low BP? Yes Did it involve sudden or severe rash/hives, skin peeling, or any reaction on the inside of your mouth or nose? No Did you need to seek medical attention at a hospital or doctor's office? No When did it last happen?      childhood allergy If all above answers are "NO", may proceed with cephalosporin use.     Lipitor [Atorvastatin] Nausea Only     Outpatient Medications Prior to Visit  Medication Sig Dispense Refill   aspirin EC 81 MG tablet Take 81 mg by mouth daily.     rosuvastatin (CRESTOR) 5 MG tablet Take 1 tablet (5 mg total) by mouth daily. 90 tablet 4   No facility-administered medications prior to visit.    Review of Systems  Constitutional:  Negative for chills, diaphoresis, fever, malaise/fatigue and weight loss.  HENT:  Negative for congestion.   Respiratory:  Positive for cough. Negative for hemoptysis, sputum production, shortness of breath and wheezing.   Cardiovascular:  Negative for chest pain, palpitations and leg swelling.     Objective:   Vitals:   05/31/22 0921  BP: 132/60  Pulse: 66  SpO2: 96%  Weight: 116 lb 6.4 oz (52.8 kg)  Height: 5\' 5"  (1.651  m)   SpO2: 96 % O2 Device: None (Room air)  Physical Exam: General: Well-appearing, no acute distress HENT: Mario Mcdowell, AT Eyes: EOMI, no scleral icterus Respiratory: Clear to auscultation bilaterally.  No crackles, wheezing or rales Cardiovascular: RRR, -M/R/G, no JVD Extremities:-Edema,-tenderness Neuro: AAO x4, CNII-XII grossly intact Psych: Normal mood, normal affect  Data Reviewed:  Imaging: CT Chest 10/17/18 -  5x4cm and 3x2cm lung masses, slightly improved compared to prior imaging. Background emphysema. CT Chest 02/21/19 - Improved but persistent RUL consolidation. Background emphysema unchanged CT Chest 10/07/19 - Similar posterior RUL cavitary lesions including 4x4cm. Anterior RUL 2.7 x 1cm lesion with increased cavitation.  CT Chest 04/29/20 - unchanged cavitary lesions with interval nodular airspace disease in the right upper lobe. Emphysema. CT Chest 05/08/21 - Similar 4.4 x 3.8 cm cavitary lesions in right upper lobe and unchanged scattered nodular densities in the right upper and middle lobes. Decreased RML sobplueral nodule to 9 x9 mm. Centrilobular emphysema.   PFT: None on file  Assessment & Plan:   Discussion: 72 year old male active smoker with hx of adenocarcinoma of the sigmoid colon s/p hemicolectomy and FOLFOX, now in remission. Initially referred to me for RUL cavitary masses s/p navigational bronch on 11/14/18 which demonstrated focal non-caseating granuloma with no malignancy seen. Has been under surveillance imaging. Overall sable cavitary lesions and noncalcified nodules on 03/04/22 CT compared to 10/17/18. 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 No indication for treatment since he is asymptomatic  Granulomatous Lung Cavitary lesions - stable --REFER to lung cancer screen program --If you develop respiratory symptoms before this, please contact our office and we can schedule imaging sooner  Emphysema --No indication for bronchodilators.  Tobacco abuse Patient is an active smoker. We discussed smoking cessation for 5 minutes. We discussed triggers and stressors and ways to deal with them. We discussed barriers to continued smoking and benefits of smoking cessation. Provided patient with information cessation techniques and interventions including [place] quitline.   Health Maintenance Immunization History  Administered Date(s) Administered   Fluad Quad(high  Dose 65+) 10/30/2018, 02/23/2020, 02/26/2022   Influenza, High Dose Seasonal PF 02/01/2021   Influenza,inj,Quad PF,6+ Mos 12/05/2016, 12/19/2017   Influenza-Unspecified 02/01/2021   PFIZER(Purple Top)SARS-COV-2 Vaccination 03/16/2019, 04/06/2019, 12/25/2019   Pfizer Covid-19 Vaccine Bivalent Booster 72yrs & up 02/01/2021   Pneumococcal Conjugate-13 01/19/2019   Pneumococcal Polysaccharide-23 02/23/2020   No orders of the defined types were placed in this encounter.  No orders of the defined types were placed in this encounter.  Return if symptoms worsen or fail to improve.   I have spent a total time of 31-minutes on the day of the appointment including chart review, data review, collecting history, coordinating care and discussing medical diagnosis and plan with the patient/family. Past medical history, allergies, medications were reviewed. Pertinent imaging, labs and tests included in this note have been reviewed and interpreted independently by me.   Jonesha Tsuchiya Mechele Collin, MD [facility] 05/31/2022 9:37 AM  Office Number [phone]

## 2022-07-10 ENCOUNTER — Other Ambulatory Visit: Payer: Self-pay | Admitting: Family Medicine

## 2022-07-12 NOTE — Telephone Encounter (Signed)
Spoke with pt asking about refill request. States he takes very rarely for leg cramps at night. Says he's down to about 2 pills and wanted a refill to have some on hand.   Tizanidine Last filled:  02/11/20, #30 Last OV:  02/26/22, CPE Next OV:  03/01/23, CPE

## 2022-10-09 DIAGNOSIS — L570 Actinic keratosis: Secondary | ICD-10-CM | POA: Diagnosis not present

## 2022-10-09 DIAGNOSIS — L720 Epidermal cyst: Secondary | ICD-10-CM | POA: Diagnosis not present

## 2022-10-09 DIAGNOSIS — C44629 Squamous cell carcinoma of skin of left upper limb, including shoulder: Secondary | ICD-10-CM | POA: Diagnosis not present

## 2023-02-17 ENCOUNTER — Other Ambulatory Visit: Payer: Self-pay | Admitting: Family Medicine

## 2023-02-17 DIAGNOSIS — N400 Enlarged prostate without lower urinary tract symptoms: Secondary | ICD-10-CM

## 2023-02-17 DIAGNOSIS — R7303 Prediabetes: Secondary | ICD-10-CM

## 2023-02-17 DIAGNOSIS — E7800 Pure hypercholesterolemia, unspecified: Secondary | ICD-10-CM

## 2023-02-20 ENCOUNTER — Ambulatory Visit: Payer: Medicare Other

## 2023-02-20 VITALS — Ht 65.0 in | Wt 116.0 lb

## 2023-02-20 DIAGNOSIS — Z0000 Encounter for general adult medical examination without abnormal findings: Secondary | ICD-10-CM

## 2023-02-20 NOTE — Progress Notes (Signed)
 Subjective:   Mario Mcdowell is a 73 y.o. male who presents for Medicare Annual/Subsequent preventive examination.  Visit Complete: Virtual I connected with  Mario Mcdowell on 02/20/23 by a audio enabled telemedicine application and verified that I am speaking with the correct person using two identifiers.  Patient Location: Home  Provider Location: Home Office  I discussed the limitations of evaluation and management by telemedicine. The patient expressed understanding and agreed to proceed.  Vital Signs: Because this visit was a virtual/telehealth visit, some criteria may be missing or patient reported. Any vitals not documented were not able to be obtained and vitals that have been documented are patient reported.  Patient Medicare AWV questionnaire was completed by the patient on (not done); I have confirmed that all information answered by patient is correct and no changes since this date.  Cardiac Risk Factors include: advanced age (>57men, >47 women);dyslipidemia;male gender;smoking/ tobacco exposure    Objective:    Today's Vitals   02/20/23 1416  Weight: 116 lb (52.6 kg)  Height: 5' 5 (1.651 m)   Body mass index is 19.3 kg/m.     02/20/2023    2:26 PM 02/19/2022    8:31 AM 02/17/2021    8:24 AM 02/17/2020    8:12 AM 03/04/2019   12:59 PM 01/15/2019    9:45 AM 11/14/2018    6:07 AM  Advanced Directives  Does Patient Have a Medical Advance Directive? Yes Yes Yes No No No No  Type of Estate Agent of [place];Living will Healthcare Power of [place];Living will Healthcare Power of [place];Living will      Does patient want to make changes to medical advance directive?  No - Patient declined Yes (MAU/Ambulatory/Procedural Areas - Information given) No - Patient declined     Copy of Healthcare Power of Attorney in Chart? Yes - validated most recent copy scanned in chart (See row information) Yes - validated most recent copy scanned in chart (See row  information)       Would patient like information on creating a medical advance directive?     No - Patient declined No - Patient declined No - Patient declined    Current Medications (verified) Outpatient Encounter Medications as of 02/20/2023  Medication Sig   aspirin  EC 81 MG tablet Take 81 mg by mouth daily.   rosuvastatin  (CRESTOR ) 5 MG tablet Take 1 tablet (5 mg total) by mouth daily.   tiZANidine  (ZANAFLEX ) 4 MG tablet TAKE 1 TABLET BY MOUTH TWICE DAILY AS NEEDED FOR MUSCLE SPASM   No facility-administered encounter medications on file as of 02/20/2023.    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 partial colectomy Cathy) 05/21/2016 and chemotherapy is planned   Full dentures    History of kidney stones 07/2015   passed without intervention   Hyperlipidemia    Pulmonary nodules    per chest CT 04-23-2016 bilateral nodules (consistent w/ granulomatous disease)   Smokers' cough (HCC)  Wears contact lenses    Past Surgical History:  Procedure Laterality Date   COLONOSCOPY  03/12/2016   4 polyps, sigmoid tumor biopsied (TVA w/ MICROSCOPIC FOCUS SUSPICIOUS FOR INVASION) pending surgical resection, rpt 1 yr Oma)   COLONOSCOPY  05/2017   2 benign polyps, rpt 2 yrs Oma)   COLONOSCOPY  07/2019   TAs, rpt 2 yrs Oma)   COLONOSCOPY  10/2021   TAs, healthy anastomosis, rpt 3 yrs Oma)   LAPAROSCOPIC PARTIAL COLECTOMY  N/A 05/21/2016   colon cancer, 1/20 positive lymp nodes. LAPAROSCOPIC PARTIAL COLECTOMY, SPLENIC FLEXURE MOBILIZATION AND PROCTOSCOPY;  Cathy Hila, MD)   MULTIPLE TOOTH EXTRACTIONS  [phone]   PORT-A-CATH REMOVAL Right 10/04/2016   Procedure: REMOVAL PORT-A-CATH;  Surgeon: Debby Hila, MD;  Location[facility];  Service: General;  Laterality: Right;   PORTACATH PLACEMENT N/A 06/21/2016   Procedure: INSERTION PORT-A-CATH;  Surgeon: Debby Hila, MD;  Location: [facility];  Service: General;  Laterality: N/A;  right   VIDEO BRONCHOSCOPY WITH ENDOBRONCHIAL NAVIGATION N/A 11/14/2018   VIDEO BRONCHOSCOPY WITH ENDOBRONCHIAL NAVIGATION for R lung cavitary lesions;  Icard, Adine CROME, DO   Family History  Problem Relation Age of Onset   Cancer Paternal Aunt        unsure details   Diabetes Cousin    Cancer Paternal Uncle        unknow type cancer    Prostate cancer Neg Hx    Kidney cancer Neg Hx    CAD Neg Hx    Stroke Neg Hx    Colon cancer Neg Hx    Esophageal cancer Neg Hx    Stomach cancer Neg Hx    Rectal cancer Neg Hx    Social History   Socioeconomic History   Marital status: Married    Spouse name: Not on file   Number of children: Not on file   Years of education: Not on file   Highest education level: Not on file  Occupational History   Not on file  Tobacco Use   Smoking status: Every Day    Current packs/day: 1.00    Average packs/day: 1 pack/day for 46.0 years (46.0 ttl pk-yrs)    Types: Cigarettes   Smokeless tobacco: Never   Tobacco comments:    3/4 pack daily  Vaping Use   Vaping status: Never Used  Substance and Sexual Activity   Alcohol use: No    Comment: quit 2000   Drug use: No   Sexual activity: Not Currently    Birth control/protection: Abstinence  Other Topics Concern   Not on file  Social History Narrative   Lives with wife, adopted daughter, adopted son   Occ: retired music therapist   Edu: 8th grade     Activity: no regular exercise   Diet: no water - causes nausea. Fruits/vegetables daily.    Social Drivers of Corporate Investment Banker Strain: Low Risk  (02/20/2023)   Overall Financial Resource Strain (CARDIA)    Difficulty of Paying Living Expenses: Not hard at all  Food Insecurity: No Food Insecurity (02/20/2023)   Hunger Vital Sign    Worried About Running Out of Food in the Last Year: Never true    Ran Out of Food in the Last Year: Never true  Transportation Needs: No Transportation Needs (02/20/2023)   PRAPARE - Administrator, Civil Service (Medical): No    Lack of Transportation (Non-Medical): No  Physical Activity: Sufficiently Active (02/20/2023)  Exercise Vital Sign    Days of Exercise per Week: 3 days    Minutes of Exercise per Session: 60 min  Stress: No Stress Concern Present (02/20/2023)   [employer] of Occupational Health - Occupational Stress Questionnaire    Feeling of Stress : Not at all  Social Connections: Socially Isolated (02/20/2023)   Social Connection and Isolation Panel [NHANES]    Frequency of Communication with Friends and Family: Once a week    Frequency of Social Gatherings with Friends and Family: Once a week    Attends Religious Services: Never    Database Administrator or Organizations: No    Attends Engineer, Structural: Never    Marital Status: Married    Tobacco Counseling Ready to quit: Not Answered Counseling given: Not Answered Tobacco comments: 3/4 pack daily  Clinical Intake:  Pre-visit preparation completed: Yes  Pain : No/denies pain  BMI - recorded: 19.3 Nutritional Status: BMI of 19-24  Normal Nutritional Risks: None Diabetes: No  How often do you need to have someone help you when you read instructions, pamphlets, or other written materials from your doctor or pharmacy?: 1 - Never  Interpreter Needed?: No  Comments: lives with wife Information entered by :: B.Delane Stalling,LPN   Activities of Daily  Living    02/20/2023    2:27 PM  In your present state of health, do you have any difficulty performing the following activities:  Hearing? 0  Vision? 0  Difficulty concentrating or making decisions? 0  Walking or climbing stairs? 0  Dressing or bathing? 0  Doing errands, shopping? 0  Preparing Food and eating ? N  Using the Toilet? N  In the past six months, have you accidently leaked urine? N  Do you have problems with loss of bowel control? N  Managing your Medications? N  Managing your Finances? N  Housekeeping or managing your Housekeeping? N    Patient Care Team: Rilla Baller, MD as PCP - General (Family Medicine) Lonni Slain, MD as PCP - Cardiology (Cardiology) Aneita Gwendlyn DASEN, MD (Inactive) as Consulting Physician (Gastroenterology) Debby Hila, MD as Consulting Physician (General Surgery) Lanny Callander, MD as Consulting Physician (Hematology) Kassie Acquanetta Bradley, MD as Consulting Physician (Pulmonary Disease)  Indicate any recent Medical Services you may have received from other than [facility] providers in the past year (date may be approximate).     Assessment:   This is a routine wellness examination for Mario Mcdowell.  Hearing/Vision screen Hearing Screening - Comments:: Pt says his hearing is good Vision Screening - Comments:: Pt says his vision is good with contacts Dr Patrcia   Goals Addressed             This Visit's Progress    Follow up with Primary Care Provider   On track    Starting 01/09/2017, I will continue to take medications as prescribed and to keep appointments as scheduled with PCP and other specialists.      Maintan healthy lifestyle   On track    02/20/23 Healthy diet Stay active     Patient Stated   On track    02/20/23-I will maintain and continue medications as prescribed.        Depression Screen    02/20/2023    2:23 PM 02/26/2022    9:31 AM 02/19/2022    8:35 AM 02/17/2021    8:28 AM 02/17/2020    8:13 AM 01/15/2019    9:46 AM  01/10/2018   10:03 AM  PHQ 2/9 Scores  PHQ - 2 Score 0 0 0 0 0 0 0  PHQ- 9 Score     0 0 0    Fall Risk    02/20/2023    2:19 PM 02/26/2022    9:31 AM 02/19/2022    8:34 AM 02/17/2021    8:26 AM 02/17/2020    8:13 AM  Fall Risk   Falls in the past year? 0 0 0 0 0  Number falls in past yr: 0  0 0 0  Injury with Fall? 0  0 0 0  Risk for fall due to : No Fall Risks   No Fall Risks No Fall Risks  Follow up Education provided;Falls prevention discussed  Falls evaluation completed;Falls prevention discussed Falls prevention discussed Falls evaluation completed;Falls prevention discussed    MEDICARE RISK AT HOME: Medicare Risk at Home Any stairs in or around the home?: No If so, are there any without handrails?: No Home free of loose throw rugs in walkways, pet beds, electrical cords, etc?: Yes Adequate lighting in your home to reduce risk of falls?: Yes Life alert?: No Use of a cane, walker or w/c?: No Grab bars in the bathroom?: Yes Shower chair or bench in shower?: Yes Elevated toilet seat or a handicapped toilet?: Yes  TIMED UP AND GO:  Was the test performed?  No    Cognitive Function:    02/17/2020    8:15 AM 01/15/2019    9:48 AM 01/10/2018   10:03 AM 01/09/2017    8:40 AM  MMSE - Mini Mental State Exam  Not completed: Refused     Orientation to time  5 5 5   Orientation to Place  5 5 5   Registration  3 3 3   Attention/ Calculation  3 0 0  Recall  3 2 3   Recall-comments   unable to recall 1 of 3 words   Language- name 2 objects   0 0  Language- repeat  1 1 1   Language- follow 3 step command   3 3  Language- read & follow direction   0 0  Write a sentence   0 0  Copy design   0 0  Total score   19 20        02/20/2023    2:29 PM 02/19/2022   11:29 AM  6CIT Screen  What Year? 0 points 0 points  What month? 0 points 0 points  What time? 0 points 0 points  Count back from 20 0 points 0 points  Months in reverse 4 points 0 points  Repeat phrase 0 points 0 points   Total Score 4 points 0 points    Immunizations Immunization History  Administered Date(s) Administered   Fluad Quad(high Dose 65+) 10/30/2018, 02/23/2020, 02/26/2022   Influenza, High Dose Seasonal PF 02/01/2021   Influenza,inj,Quad PF,6+ Mos 12/05/2016, 12/19/2017   Influenza-Unspecified 02/01/2021   PFIZER(Purple Top)SARS-COV-2 Vaccination 03/16/2019, 04/06/2019, 12/25/2019   Pfizer Covid-19 Vaccine Bivalent Booster 40yrs & up 02/01/2021   Pneumococcal Conjugate-13 01/19/2019   Pneumococcal Polysaccharide-23 02/23/2020    TDAP status: Up to date  Flu Vaccine status: Up to date  Pneumococcal vaccine status: Up to date  Covid-19 vaccine status: Completed vaccines  Qualifies for Shingles Vaccine? Yes   Zostavax completed No   Shingrix Completed?: No.    Education has been provided regarding the importance of this vaccine. Patient has been advised to call insurance company to determine out of pocket expense if they have not  yet received this vaccine. Advised may also receive vaccine at local pharmacy or Health Dept. Verbalized acceptance and understanding.  Screening Tests Health Maintenance  Topic Date Due   DTaP/Tdap/Td (1 - Tdap) Never done   Zoster Vaccines- Shingrix (1 of 2) Never done   INFLUENZA VACCINE  08/16/2022   COVID-19 Vaccine (5 - 2024-25 season) 09/16/2022   Lung Cancer Screening  05/03/2023   Medicare Annual Wellness (AWV)  02/20/2024   Colonoscopy  10/18/2024   Pneumonia Vaccine 67+ Years old  Completed   Hepatitis C Screening  Completed   HPV VACCINES  Aged Out    Health Maintenance  Health Maintenance Due  Topic Date Due   DTaP/Tdap/Td (1 - Tdap) Never done   Zoster Vaccines- Shingrix (1 of 2) Never done   INFLUENZA VACCINE  08/16/2022   COVID-19 Vaccine (5 - 2024-25 season) 09/16/2022    Colorectal cancer screening: Type of screening: Colonoscopy. Completed 10/18/2021. Repeat every 3 years  Lung Cancer Screening: (Low Dose CT Chest  recommended if Age 72-80 years, 20 pack-year currently smoking OR have quit w/in 15years.) does qualify.   Lung Cancer Screening Referral: already in place  Additional Screening:  Hepatitis C Screening: does not qualify; Completed 12/30/2015  Vision Screening: Recommended annual ophthalmology exams for early detection of glaucoma and other disorders of the eye. Is the patient up to date with their annual eye exam?  Yes  Who is the provider or what is the name of the office in which the patient attends annual eye exams? Dr Patrcia If pt is not established with a provider, would they like to be referred to a provider to establish care? No .   Dental Screening: Recommended annual dental exams for proper oral hygiene  Diabetic Foot Exam: n/a  Community Resource Referral / Chronic Care Management: CRR required this visit?  No   CCM required this visit?  No     Plan:     I have personally reviewed and noted the following in the patient's chart:   Medical and social history Use of alcohol, tobacco or illicit drugs  Current medications and supplements including opioid prescriptions. Patient is not currently taking opioid prescriptions. Functional ability and status Nutritional status Physical activity Advanced directives List of other physicians Hospitalizations, surgeries, and ER visits in previous 12 months Vitals Screenings to include cognitive, depression, and falls Referrals and appointments  In addition, I have reviewed and discussed with patient certain preventive protocols, quality metrics, and best practice recommendations. A written personalized care plan for preventive services as well as general preventive health recommendations were provided to patient.     Mario LITTIE Saris, LPN   07/18/7972   After Visit Summary: (MyChart) Due to this being a telephonic visit, the after visit summary with patients personalized plan was offered to patient via MyChart   Nurse  Notes: The patient states he is doing well and has no concerns or questions at this time.

## 2023-02-20 NOTE — Patient Instructions (Signed)
 Mario Mcdowell , Thank you for taking time to come for your Medicare Wellness Visit. I appreciate your ongoing commitment to your health goals. Please review the following plan we discussed and let me know if I can assist you in the future.   Referrals/Orders/Follow-Ups/Clinician Recommendations: none  This is a list of the screening recommended for you and due dates:  Health Maintenance  Topic Date Due   DTaP/Tdap/Td vaccine (1 - Tdap) Never done   Zoster (Shingles) Vaccine (1 of 2) Never done   Flu Shot  08/16/2022   COVID-19 Vaccine (5 - 2024-25 season) 09/16/2022   Screening for Lung Cancer  05/03/2023   Medicare Annual Wellness Visit  02/20/2024   Colon Cancer Screening  10/18/2024   Pneumonia Vaccine  Completed   Hepatitis C Screening  Completed   HPV Vaccine  Aged Out    Advanced directives: (In Chart) A copy of your advanced directives are scanned into your chart should your provider ever need it.  Next Medicare Annual Wellness Visit scheduled for next year: Yes 02/21/2024 @ 2:20pm televist

## 2023-02-21 ENCOUNTER — Other Ambulatory Visit (INDEPENDENT_AMBULATORY_CARE_PROVIDER_SITE_OTHER): Payer: Medicare Other

## 2023-02-21 DIAGNOSIS — N400 Enlarged prostate without lower urinary tract symptoms: Secondary | ICD-10-CM | POA: Diagnosis not present

## 2023-02-21 DIAGNOSIS — R7303 Prediabetes: Secondary | ICD-10-CM

## 2023-02-21 DIAGNOSIS — E7800 Pure hypercholesterolemia, unspecified: Secondary | ICD-10-CM | POA: Diagnosis not present

## 2023-02-21 LAB — PSA: PSA: 0.68 ng/mL (ref 0.10–4.00)

## 2023-02-21 LAB — COMPREHENSIVE METABOLIC PANEL
ALT: 23 U/L (ref 0–53)
AST: 19 U/L (ref 0–37)
Albumin: 4.2 g/dL (ref 3.5–5.2)
Alkaline Phosphatase: 75 U/L (ref 39–117)
BUN: 16 mg/dL (ref 6–23)
CO2: 28 meq/L (ref 19–32)
Calcium: 8.9 mg/dL (ref 8.4–10.5)
Chloride: 105 meq/L (ref 96–112)
Creatinine, Ser: 0.82 mg/dL (ref 0.40–1.50)
GFR: 87.76 mL/min (ref >60.00)
Glucose, Bld: 78 mg/dL (ref 70–99)
Potassium: 4.3 meq/L (ref 3.5–5.1)
Sodium: 141 meq/L (ref 135–145)
Total Bilirubin: 0.3 mg/dL (ref 0.2–1.2)
Total Protein: 6.6 g/dL (ref 6.0–8.3)

## 2023-02-21 LAB — LIPID PANEL
Cholesterol: 165 mg/dL (ref 0–200)
HDL: 36.4 mg/dL — ABNORMAL LOW (ref >39.00)
LDL Cholesterol: 104 mg/dL — ABNORMAL HIGH (ref 0–99)
NonHDL: 128.64
Total CHOL/HDL Ratio: 5
Triglycerides: 123 mg/dL (ref 0.0–149.0)
VLDL: 24.6 mg/dL (ref 0.0–40.0)

## 2023-02-21 LAB — HEMOGLOBIN A1C: Hgb A1c MFr Bld: 6.5 % (ref 4.6–6.5)

## 2023-03-01 ENCOUNTER — Ambulatory Visit: Payer: Medicare Other | Admitting: Family Medicine

## 2023-03-01 ENCOUNTER — Encounter: Payer: Self-pay | Admitting: Family Medicine

## 2023-03-01 ENCOUNTER — Encounter: Payer: Self-pay | Admitting: Hematology

## 2023-03-01 VITALS — BP 136/74 | HR 66 | Temp 97.8°F | Ht 63.25 in | Wt 114.5 lb

## 2023-03-01 DIAGNOSIS — E1151 Type 2 diabetes mellitus with diabetic peripheral angiopathy without gangrene: Secondary | ICD-10-CM

## 2023-03-01 DIAGNOSIS — I708 Atherosclerosis of other arteries: Secondary | ICD-10-CM

## 2023-03-01 DIAGNOSIS — J432 Centrilobular emphysema: Secondary | ICD-10-CM

## 2023-03-01 DIAGNOSIS — Z720 Tobacco use: Secondary | ICD-10-CM

## 2023-03-01 DIAGNOSIS — Z0000 Encounter for general adult medical examination without abnormal findings: Secondary | ICD-10-CM | POA: Diagnosis not present

## 2023-03-01 DIAGNOSIS — E7800 Pure hypercholesterolemia, unspecified: Secondary | ICD-10-CM

## 2023-03-01 DIAGNOSIS — J984 Other disorders of lung: Secondary | ICD-10-CM

## 2023-03-01 DIAGNOSIS — C187 Malignant neoplasm of sigmoid colon: Secondary | ICD-10-CM

## 2023-03-01 DIAGNOSIS — Z23 Encounter for immunization: Secondary | ICD-10-CM

## 2023-03-01 DIAGNOSIS — I771 Stricture of artery: Secondary | ICD-10-CM

## 2023-03-01 DIAGNOSIS — I700 Atherosclerosis of aorta: Secondary | ICD-10-CM

## 2023-03-01 DIAGNOSIS — Z7189 Other specified counseling: Secondary | ICD-10-CM

## 2023-03-01 DIAGNOSIS — C772 Secondary and unspecified malignant neoplasm of intra-abdominal lymph nodes: Secondary | ICD-10-CM

## 2023-03-01 MED ORDER — ROSUVASTATIN CALCIUM 10 MG PO TABS: 10.0000 mg | ORAL_TABLET | Freq: Every day | ORAL | 4 refills | Status: AC

## 2023-03-01 NOTE — Assessment & Plan Note (Signed)
Asxs off respiratory meds. Released from pulm care.

## 2023-03-01 NOTE — Assessment & Plan Note (Signed)
Encouraged full cessation - precontemplative.

## 2023-03-01 NOTE — Patient Instructions (Addendum)
Flu shot today  I encourage cutting down/quitting smoking.  If interested, check with pharmacy about yearly COVID booster, RSV shot and the 2 shot shingles series (shingrix).  Good to see you today Return as needed or in 1 year for next wellness visit/physical

## 2023-03-01 NOTE — Assessment & Plan Note (Signed)
Encouraged limiting added sugar in diet - he will drop sugar use with coffee.

## 2023-03-01 NOTE — Assessment & Plan Note (Signed)
L carotid bruit due to this. Carotid arteries reassuring on latest [place]

## 2023-03-01 NOTE — Assessment & Plan Note (Signed)
Chronic, increase rosuvastatin to 10mg  daily - new dose at pharmacy. The 10-year ASCVD risk score (Arnett DK, et al., 2019) is: 27.1%   Values used to calculate the score:     Age: 73 years     Sex: Male     Is Non-Hispanic African American: No     Diabetic: No     Tobacco smoker: Yes     Systolic Blood Pressure: 136 mmHg     Is BP treated: No     HDL Cholesterol: 36.4 mg/dL     Total Cholesterol: 165 mg/dL d

## 2023-03-01 NOTE — Progress Notes (Signed)
Ph: [phone] Fax: [phone]   Patient ID: Mario Mcdowell, male    DOB: 03/23/1950, 73 y.o.   MRN: [id number]  This visit was conducted in person.  BP 136/74   Pulse 66   Temp 97.8 F (36.6 C) (Oral)   Ht 5' 3.25" (1.607 m)   Wt 114 lb 8 oz (51.9 kg)   SpO2 96%   BMI 20.12 kg/m    CC: CPE Subjective:   HPI: Mario Mcdowell is a 73 y.o. male presenting on 03/01/2023 for Annual Exam (MCR prt 2 [AWV- 02/20/23].)   Saw health advisor last week for medicare wellness visit. Note reviewed.   No results found.  Flowsheet Row Clinical Support from 02/20/2023 in [facility]  PHQ-2 Total Score 0          02/20/2023    2:19 PM 02/26/2022    9:31 AM 02/19/2022    8:34 AM 02/17/2021    8:26 AM 02/17/2020    8:13 AM  Fall Risk   Falls in the past year? 0 0 0 0 0  Number falls in past yr: 0  0 0 0  Injury with Fall? 0  0 0 0  Risk for fall due to : No Fall Risks   No Fall Risks No Fall Risks  Follow up Education provided;Falls prevention discussed  Falls evaluation completed;Falls prevention discussed Falls prevention discussed Falls evaluation completed;Falls prevention discussed   Known stage IIIB colon cancer 2018 s/p hemicolectomy with FOLFOX in remission. Saw onc Dr Mosetta Putt, last 03/2021 - f/u planned PRN.  No blood in stool, bowel changes.  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 Plan: established with lung cancer screening program.  Emphysema continued smoker, he is overall asymptomatic and pre-contemplative regarding smoking cessation.   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 Followed by Dr Edilia Bo,  last seen 04/2022.   Preventative: Colon cancer - s/p laparoscopic hemicolectomy then adjuvant FOLFOX chemo 05/2016 for stage IIIB colon cancer metastatic to intra-abd LN, onc Mosetta Putt).  Colonoscopy 07/2019 - rpt 2 yrs Russella Dar) Colonoscopy 10/2021 - TAs, healthy anastomosis, rpt 3 yrs Russella Dar) Prostate cancer screening - PSA reassuring. h/o BPH.  Lung cancer screening - see above, planned lung cancer screening program Flu shot - yearly COVID vaccine Pfizer 03/2019 x2, booster 12/2019, bivalent booster 01/2021 Prevnar-13 01/2019, pneumovax 02/2020 Tetanus shot - unsure RSV - discussed, declines  Shingles shot - discussed, declines  Advanced directive discussion - brought 02/2021, scanned. Daughter Rodman Pickle and son Riki Rusk are HCPOA. 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 Would be ok with trial for reversible condition and with feeding tube. Wants HCPOA to take precedence over living will. Seat belt use discussed.  Sunscreen use discussed, no changing moles on skin.  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  Drinks 1 gallon milk/day. Fruits/vegetables daily.      Relevant past medical, surgical, family and social history reviewed and updated as indicated. Interim medical history since our last visit reviewed. Allergies and medications reviewed and updated. Outpatient Medications Prior to Visit  Medication Sig Dispense Refill   aspirin EC 81 MG tablet Take 81 mg by mouth daily.     tiZANidine (ZANAFLEX) 4 MG tablet TAKE 1 TABLET BY MOUTH TWICE DAILY AS NEEDED FOR MUSCLE SPASM 30 tablet 0   rosuvastatin (CRESTOR) 5 MG tablet Take 1 tablet (5 mg total)  by mouth daily. 90 tablet 4   No facility-administered medications prior to visit.     Per HPI unless specifically indicated in ROS section below Review of Systems  Constitutional:  Negative for activity change, appetite change, chills, fatigue, fever and unexpected weight change.  HENT:  Negative for hearing loss.   Eyes:  Negative for visual disturbance.  Respiratory:  Negative for cough, chest tightness, shortness of breath and wheezing.   Cardiovascular:  Negative for chest pain, palpitations and leg swelling.  Gastrointestinal:  Negative for abdominal distention, abdominal pain, blood in stool, constipation, diarrhea, nausea and vomiting.  Genitourinary:  Negative for difficulty urinating and hematuria.  Musculoskeletal:  Negative for arthralgias, myalgias and neck pain.  Skin:  Negative for rash.  Neurological:  Negative for dizziness, seizures, syncope and headaches.  Hematological:  Negative for adenopathy. Does not bruise/bleed easily.  Psychiatric/Behavioral:  Negative for dysphoric mood. The patient is not nervous/anxious.     Objective:  BP 136/74   Pulse 66   Temp 97.8 F (36.6 C) (Oral)   Ht 5' 3.25" (1.607 m)   Wt 114 lb 8 oz (51.9 kg)   SpO2 96%   BMI 20.12 kg/m   Wt Readings from Last 3 Encounters:  03/01/23 114 lb 8 oz (51.9 kg)  02/20/23 116 lb (52.6 kg)  05/31/22 116 lb 6.4 oz (52.8 kg)      Physical Exam Vitals and nursing note reviewed.  Constitutional:      General: He is not in acute distress.    Appearance: Normal appearance. He is well-developed. He is not ill-appearing.  HENT:     Head: Normocephalic and atraumatic.     Right Ear: Hearing, tympanic membrane, ear canal and external ear normal.     Left Ear: Hearing, tympanic membrane, ear canal and external ear normal.     Mouth/Throat:     Mouth: Mucous membranes are moist.     Pharynx: Oropharynx is clear. No oropharyngeal exudate or posterior oropharyngeal erythema.  Eyes:     General: No  scleral icterus.    Extraocular Movements: Extraocular movements intact.     Conjunctiva/sclera: Conjunctivae normal.     Pupils: Pupils are equal, round, and reactive to light.  Neck:     Thyroid: No thyroid mass or thyromegaly.     Vascular: Carotid bruit (left sided) present.  Cardiovascular:     Rate and Rhythm: Normal rate and regular rhythm.     Pulses: Normal pulses.          Radial pulses are 2+ on the right side and 2+ on the left side.     Heart sounds: Normal heart sounds. No murmur heard. Pulmonary:     Effort: Pulmonary effort is normal. No respiratory distress.     Breath sounds: Normal breath sounds. No wheezing, rhonchi or rales.  Abdominal:     General: Bowel sounds are normal. There is no  distension.     Palpations: Abdomen is soft. There is no mass.     Tenderness: There is no abdominal tenderness. There is no guarding or rebound.     Hernia: No hernia is present.  Musculoskeletal:        General: Normal range of motion.     Cervical back: Normal range of motion and neck supple.     Right lower leg: No edema.     Left lower leg: No edema.  Lymphadenopathy:     Cervical: No cervical adenopathy.  Skin:    General: Skin is warm and dry.     Findings: No rash.  Neurological:     General: No focal deficit present.     Mental Status: He is alert and oriented to person, place, and time.  Psychiatric:        Mood and Affect: Mood normal.        Behavior: Behavior normal.        Thought Content: Thought content normal.        Judgment: Judgment normal.       Results for orders placed or performed in visit on 02/21/23  PSA   Collection Time: 02/21/23  8:08 AM  Result Value Ref Range   PSA 0.68 0.10 - 4.00 ng/mL  Hemoglobin A1c   Collection Time: 02/21/23  8:08 AM  Result Value Ref Range   Hgb A1c MFr Bld 6.5 4.6 - 6.5 %  Comprehensive metabolic panel   Collection Time: 02/21/23  8:08 AM  Result Value Ref Range   Sodium 141 135 - 145 mEq/L   Potassium 4.3  3.5 - 5.1 mEq/L   Chloride 105 96 - 112 mEq/L   CO2 28 19 - 32 mEq/L   Glucose, Bld 78 70 - 99 mg/dL   BUN 16 6 - 23 mg/dL   Creatinine, Ser 2.84 0.40 - 1.50 mg/dL   Total Bilirubin 0.3 0.2 - 1.2 mg/dL   Alkaline Phosphatase 75 39 - 117 U/L   AST 19 0 - 37 U/L   ALT 23 0 - 53 U/L   Total Protein 6.6 6.0 - 8.3 g/dL   Albumin 4.2 3.5 - 5.2 g/dL   GFR 13.24 >40.10 mL/min   Calcium 8.9 8.4 - 10.5 mg/dL  Lipid panel   Collection Time: 02/21/23  8:08 AM  Result Value Ref Range   Cholesterol 165 0 - 200 mg/dL   Triglycerides 272.5 0.0 - 149.0 mg/dL   HDL 36.64 (L) >40.34 mg/dL   VLDL 74.2 0.0 - 59.5 mg/dL   LDL Cholesterol 638 (H) 0 - 99 mg/dL   Total CHOL/HDL Ratio 5    NonHDL 128.64     Assessment & Plan:   Problem List Items Addressed This Visit     Advanced care planning/counseling discussion (Chronic)   Previously discussed      Health maintenance examination - Primary (Chronic)   Preventative protocols reviewed and updated unless pt declined. Discussed healthy diet and lifestyle.       Aorto-iliac atherosclerosis (HCC)   Continue aspirin, statin, VVS f/u Increase  rosuvastatin to 10mg  daily.       Relevant Medications   rosuvastatin (CRESTOR) 10 MG tablet   Tobacco abuse   Encouraged full cessation - precontemplative.       HLD (hyperlipidemia)   Chronic, increase rosuvastatin to 10mg  daily - new dose at pharmacy. 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   Denies blood in stool or GI symptoms.  Planned rpt colonoscopy 10/2024.       Type 2 diabetes mellitus with circulatory disorder, without long-term current use of insulin (HCC)   Encouraged limiting added sugar in diet - he will drop sugar use with coffee.       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 Released from pulm care.      Subclavian artery stenosis, left (HCC)   L carotid bruit due to this. Carotid arteries reassuring on latest [place]      Relevant Medications   rosuvastatin (CRESTOR) 10 MG tablet   Other Visit Diagnoses       Encounter for immunization       Relevant Orders   Flu Vaccine Trivalent High Dose (Fluad) (Completed)        Meds ordered this encounter  Medications   rosuvastatin (CRESTOR) 10 MG tablet    Sig: Take 1 tablet (10 mg total) by mouth daily.    Dispense:  90 tablet    Refill:  4    Orders Placed This Encounter  Procedures   Flu Vaccine Trivalent High Dose (Fluad)    Patient Instructions  Flu shot today  I encourage cutting down/quitting smoking.  If interested, check with pharmacy about yearly COVID booster, RSV shot and the 2 shot shingles series (shingrix).  Good to see you today Return as needed or in 1 year for next wellness visit/physical   Follow up plan: Return in about 1 year (around 02/29/2024) for annual exam, prior fasting for blood work, medicare wellness visit.  Eustaquio Boyden, MD

## 2023-03-01 NOTE — Assessment & Plan Note (Signed)
Continue aspirin, statin, VVS f/u Increase  rosuvastatin to 10mg  daily.

## 2023-03-01 NOTE — Assessment & Plan Note (Signed)
Preventative protocols reviewed and updated unless pt declined. Discussed healthy diet and lifestyle.

## 2023-03-01 NOTE — Assessment & Plan Note (Signed)
Cancer free since treatment 2018, released from onc care.  Denies blood in stool or GI symptoms.  Planned rpt colonoscopy 10/2024.

## 2023-03-01 NOTE — Assessment & Plan Note (Signed)
Released from pulm care, planned yearly lung cancer screening CT

## 2023-03-01 NOTE — Assessment & Plan Note (Signed)
Previously discussed.

## 2023-04-16 ENCOUNTER — Other Ambulatory Visit: Payer: Self-pay | Admitting: Family Medicine

## 2023-04-16 NOTE — Telephone Encounter (Signed)
 Change in therapy. Strength increased to 10 mg daily (see 03/01/23 OV notes). New rx sent on 03/01/23, #90/4 refills to [facility]  Request denied.

## 2023-05-15 ENCOUNTER — Other Ambulatory Visit: Payer: Self-pay

## 2023-05-15 DIAGNOSIS — I739 Peripheral vascular disease, unspecified: Secondary | ICD-10-CM

## 2023-05-16 DIAGNOSIS — H40033 Anatomical narrow angle, bilateral: Secondary | ICD-10-CM | POA: Diagnosis not present

## 2023-05-16 DIAGNOSIS — H2513 Age-related nuclear cataract, bilateral: Secondary | ICD-10-CM | POA: Diagnosis not present

## 2023-05-16 DIAGNOSIS — H25013 Cortical age-related cataract, bilateral: Secondary | ICD-10-CM | POA: Diagnosis not present

## 2023-05-16 DIAGNOSIS — H02824 Cysts of left upper eyelid: Secondary | ICD-10-CM | POA: Diagnosis not present

## 2023-05-17 ENCOUNTER — Ambulatory Visit (HOSPITAL_COMMUNITY)
Admission: RE | Admit: 2023-05-17 | Discharge: 2023-05-17 | Disposition: A | Discharge status: Home / Self Care | Payer: Medicare Other | Source: Ambulatory Visit | Attending: Vascular Surgery | Admitting: Vascular Surgery

## 2023-05-17 ENCOUNTER — Ambulatory Visit: Payer: Medicare Other | Attending: Vascular Surgery | Admitting: Physician Assistant

## 2023-05-17 VITALS — BP 166/83 | HR 50 | Temp 98.2°F | Wt 114.8 lb

## 2023-05-17 DIAGNOSIS — I739 Peripheral vascular disease, unspecified: Secondary | ICD-10-CM

## 2023-05-17 DIAGNOSIS — I7409 Other arterial embolism and thrombosis of abdominal aorta: Secondary | ICD-10-CM | POA: Diagnosis not present

## 2023-05-17 LAB — VAS US ABI WITH/WO TBI
Left ABI: 0.45
Right ABI: 0.58

## 2023-05-17 NOTE — Progress Notes (Signed)
 Office Note     CC:  follow up Requesting Provider:  Claire Crick, MD  HPI: Mario Mcdowell is a 73 y.o. (1950/04/07) male who presents for routine follow up of PAD and carotid stenosis. He has known aortic occlusion. He has had non disabling buttock claudication that we have been following. He has no history of TIA or stroke. At his last visit Dr. Shaunna Delaware his carotid duplex showed 1-39% bilaterally. No non invasive studies recommended at this time.   Today he reports overall doing well. No changes in medical history since his last visit. He reports some calf tightness/ cramping usually at night only after he has done a lot of strenuous activity. This does not happen that often. Usually he tries to get up and walk it off or takes a muscle relaxer and it will subside. He reports no pain on regular ambulation or rest. No tissue loss. He has some calluses/ bunions that are sore on his feet but he reports that Dr. Shaunna Delaware told him to not have surgery on these as he would be at high risk for poor healing. He says he stays very active. 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 He is medically managed on Aspirin  and Statin. He currently smokes 1-1.5 ppd.   Past Medical History:  Diagnosis Date   Aorto-iliac atherosclerosis (HCC)    by CT; in bilateral legs    Arthritis    Atherosclerotic PVD with intermittent claudication (HCC)    BPH without obstruction/lower urinary tract symptoms 10/25/2015   Centrilobular emphysema (HCC)    per chest CT 04-23-2016   Colon adenocarcinoma [facility]) oncologist- dr Maryalice Smaller   dx 05-21-2016 -- Stage IIIB (pT3,pN1,M0) , Invasive sigmoid adenocarcinoma well to moderately differentiated with metastatic intra-abdominal lymph node (1 out of 22), s/p lap. partial colectomy Andy Bannister) 05/21/2016 and chemotherapy is planned   Full dentures    History of kidney stones 07/2015   passed without intervention   Hyperlipidemia    Pulmonary nodules    per chest CT  04-23-2016 bilateral nodules (consistent w/ granulomatous disease)   Smokers' cough (HCC)    Wears contact lenses     Past Surgical History:  Procedure Laterality Date   COLONOSCOPY  03/12/2016   4 polyps, sigmoid tumor biopsied (TVA w/ MICROSCOPIC FOCUS SUSPICIOUS FOR INVASION) pending surgical resection, rpt 1 yr Sandrea Cruel)   COLONOSCOPY  05/2017   2 benign polyps, rpt 2 yrs Sandrea Cruel)   COLONOSCOPY  07/2019   TAs, rpt 2 yrs Sandrea Cruel)   COLONOSCOPY  10/2021   TAs, healthy anastomosis, rpt 3 yrs Sandrea Cruel)   LAPAROSCOPIC PARTIAL COLECTOMY N/A 05/21/2016   colon cancer, 1/20 positive lymp nodes. LAPAROSCOPIC PARTIAL COLECTOMY, SPLENIC FLEXURE MOBILIZATION AND PROCTOSCOPY;  Joyce Nixon, MD)   MULTIPLE TOOTH EXTRACTIONS  [id number]   PORT-A-CATH REMOVAL Right 10/04/2016   Procedure: REMOVAL PORT-A-CATH;  Surgeon: Joyce Nixon, MD;  Location: [facility];  Service: General;  Laterality: Right;   PORTACATH PLACEMENT N/A 06/21/2016   Procedure: INSERTION PORT-A-CATH;  Surgeon: Joyce Nixon, MD;  Location: [facility];  Service: General;  Laterality: N/A;  right   VIDEO BRONCHOSCOPY WITH ENDOBRONCHIAL NAVIGATION N/A 11/14/2018   VIDEO BRONCHOSCOPY WITH ENDOBRONCHIAL NAVIGATION for R lung cavitary lesions;  Icard, Lucie Ruts, DO    Social History   Socioeconomic History   Marital status: Married    Spouse name: Not on file   Number of children: Not on file   Years  of education: Not on file   Highest education level: Not on file  Occupational History   Not on file  Tobacco Use   Smoking status: Every Day    Current packs/day: 1.00    Average packs/day: 1 pack/day for 46.0 years (46.0 ttl pk-yrs)    Types: Cigarettes   Smokeless tobacco: Never   Tobacco comments:    3/4 pack daily  Vaping Use   Vaping status: Never Used  Substance and Sexual Activity   Alcohol use: No    Comment: quit 2000   Drug use: No   Sexual activity: Not Currently    Birth  control/protection: Abstinence  Other Topics Concern   Not on file  Social History Narrative   Lives with wife, adopted daughter, adopted son   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 Fruits/vegetables daily.    Social Drivers of Corporate investment banker Strain: Low Risk  (02/20/2023)   Overall Financial Resource Strain (CARDIA)    Difficulty of Paying Living Expenses: Not hard at all  Food Insecurity: No Food Insecurity (02/20/2023)   Hunger Vital Sign    Worried About Running Out of Food in the Last Year: Never true    Ran Out of Food in the Last Year: Never true  Transportation Needs: No Transportation Needs (02/20/2023)   PRAPARE - Administrator, Civil Service (Medical): No    Lack of Transportation (Non-Medical): No  Physical Activity: Sufficiently Active (02/20/2023)   Exercise Vital Sign    Days of Exercise per Week: 3 days    Minutes of Exercise per Session: 60 min  Stress: No Stress Concern Present (02/20/2023)   [employer] of Occupational Health - Occupational Stress Questionnaire    Feeling of Stress : Not at all  Social Connections: Socially Isolated (02/20/2023)   Social Connection and Isolation Panel [NHANES]    Frequency of Communication with Friends and Family: Once a week    Frequency of Social Gatherings with Friends and Family: Once a week    Attends Religious Services: Never    Database administrator or Organizations: No    Attends Banker Meetings: Never    Marital Status: Married  Catering manager Violence: Not At Risk (02/20/2023)   Humiliation, Afraid, Rape, and Kick questionnaire    Fear of Current or Ex-Partner: No    Emotionally Abused: No    Physically Abused: No    Sexually Abused: No      Current Outpatient Medications  Medication Sig Dispense Refill   aspirin  EC 81 MG tablet Take 81 mg by mouth daily.     rosuvastatin  (CRESTOR ) 10 MG tablet Take 1  tablet (10 mg total) by mouth daily. 90 tablet 4   tiZANidine  (ZANAFLEX ) 4 MG tablet TAKE 1 TABLET BY MOUTH TWICE DAILY AS NEEDED FOR MUSCLE SPASM 30 tablet 0   No current facility-administered medications for this visit.    Allergies  Allergen Reactions   Penicillins Swelling    Did it involve swelling of the face/tongue/throat, SOB, or low BP? Yes Did it involve sudden or severe rash/hives, skin peeling, or any reaction on the inside of your mouth or nose? No Did you need to seek medical attention at a hospital or doctor's office? No When did it last happen?      childhood allergy If all above answers are "NO", may proceed with  cephalosporin use.     Lipitor [Atorvastatin ] Nausea Only     REVIEW OF SYSTEMS:  [X]  denotes positive finding, [ ]  denotes negative finding Cardiac  Comments:  Chest pain or chest pressure:    Shortness of breath upon exertion:    Short of breath when lying flat:    Irregular heart rhythm:        Vascular    Pain in calf, thigh, or hip brought on by ambulation:    Pain in feet at night that wakes you up from your sleep:     Blood clot in your veins:    Leg swelling:         Pulmonary    Oxygen at home:    Productive cough:     Wheezing:         Neurologic    Sudden weakness in arms or legs:     Sudden numbness in arms or legs:     Sudden onset of difficulty speaking or slurred speech:    Temporary loss of vision in one eye:     Problems with dizziness:         Gastrointestinal    Blood in stool:     Vomited blood:         Genitourinary    Burning when urinating:     Blood in urine:        Psychiatric    Major depression:         Hematologic    Bleeding problems:    Problems with blood clotting too easily:        Skin    Rashes or ulcers:        Constitutional    Fever or chills:      PHYSICAL EXAMINATION:  There were no vitals filed for this visit.  General:  WDWN in NAD; vital signs documented above Gait: Not  observed HENT: WNL, normocephalic Pulmonary: normal non-labored breathing without wheezing Cardiac: regular HR Abdomen: soft, flat Vascular Exam/Pulses:  Extremities: without ischemic changes, without Gangrene , without cellulitis; without open wounds;  Musculoskeletal: no muscle wasting or atrophy  Neurologic: A&O X 3 Psychiatric:  The pt has Normal affect.   Non-Invasive Vascular Imaging:   +-------+-----------+-----------+------------+------------+  ABI/TBIToday's ABIToday's TBIPrevious ABIPrevious TBI  +-------+-----------+-----------+------------+------------+  Right 0.58       0.34       0.62        0.33          +-------+-----------+-----------+------------+------------+  Left  0.45       0.36       0.47        0.28          +-------+-----------+-----------+------------+------------+     ASSESSMENT/PLAN:: 73 y.o. male here follow up of PAD. He has known aortic occlusion. He has had non disabling buttock claudication that we have been following. We have followed his carotid stenosis in the past. He has no history of TIA or stroke. At his last visit Dr. Shaunna Delaware his carotid duplex showed 1-39% bilaterally. No non invasive studies recommended at this time. He is without any claudication, rest pain or tissue loss. Has some pain on strenuous activity but this is non disabling.  - ABI today is overall stable from 1 year ago - discussed smoking cessation but he is not interested in quitting - encourage walking regimen - continue Aspirin  and statin - He will follow up in 1 year with repeat ABI's - he knows to follow up  earlier if any new or worsening symptoms   Deneen Finical, PA-C [facility] [phone]  Clinic MD:   Mariana Shipper

## 2024-02-07 ENCOUNTER — Encounter: Payer: Self-pay | Admitting: Hematology

## 2024-02-21 ENCOUNTER — Ambulatory Visit: Payer: Medicare Other

## 2024-03-02 ENCOUNTER — Encounter: Payer: Medicare Other | Admitting: Family Medicine

## 2024-05-14 ENCOUNTER — Ambulatory Visit
